# Patient Record
Sex: Female | Born: 1960 | Race: Black or African American | Hispanic: No | Marital: Single | Smoking: Never smoker
Health system: Southern US, Community
[De-identification: ages and names within clinical notes are randomized; demographics above are authoritative.]

## PROBLEM LIST (undated history)

## (undated) ENCOUNTER — Emergency Department (HOSPITAL_COMMUNITY): Admission: EM | Discharge status: Absent without leave | Payer: Medicare HMO

## (undated) DIAGNOSIS — I729 Aneurysm of unspecified site: Secondary | ICD-10-CM

## (undated) DIAGNOSIS — J189 Pneumonia, unspecified organism: Secondary | ICD-10-CM

## (undated) DIAGNOSIS — M1990 Unspecified osteoarthritis, unspecified site: Secondary | ICD-10-CM

## (undated) DIAGNOSIS — E039 Hypothyroidism, unspecified: Secondary | ICD-10-CM

## (undated) DIAGNOSIS — K633 Ulcer of intestine: Secondary | ICD-10-CM

## (undated) DIAGNOSIS — L97929 Non-pressure chronic ulcer of unspecified part of left lower leg with unspecified severity: Secondary | ICD-10-CM

## (undated) DIAGNOSIS — D571 Sickle-cell disease without crisis: Secondary | ICD-10-CM

## (undated) DIAGNOSIS — E876 Hypokalemia: Secondary | ICD-10-CM

## (undated) DIAGNOSIS — F419 Anxiety disorder, unspecified: Secondary | ICD-10-CM

## (undated) DIAGNOSIS — K8020 Calculus of gallbladder without cholecystitis without obstruction: Secondary | ICD-10-CM

## (undated) DIAGNOSIS — L97919 Non-pressure chronic ulcer of unspecified part of right lower leg with unspecified severity: Secondary | ICD-10-CM

## (undated) HISTORY — DX: Ulcer of intestine: K63.3

## (undated) HISTORY — DX: Calculus of gallbladder without cholecystitis without obstruction: K80.20

## (undated) HISTORY — PX: OTHER SURGICAL HISTORY: SHX169

## (undated) HISTORY — PX: CEREBRAL ANEURYSM REPAIR: SHX164

## (undated) HISTORY — PX: ANKLE SURGERY: SHX546

---

## 2011-09-25 ENCOUNTER — Encounter (HOSPITAL_COMMUNITY): Payer: Self-pay | Admitting: Emergency Medicine

## 2011-09-25 ENCOUNTER — Emergency Department (HOSPITAL_COMMUNITY)
Admission: EM | Admit: 2011-09-25 | Discharge: 2011-09-26 | Disposition: A | Discharge status: Home Health | Payer: Medicare Other | Attending: Emergency Medicine | Admitting: Emergency Medicine

## 2011-09-25 DIAGNOSIS — R109 Unspecified abdominal pain: Secondary | ICD-10-CM | POA: Insufficient documentation

## 2011-09-25 DIAGNOSIS — N390 Urinary tract infection, site not specified: Secondary | ICD-10-CM | POA: Insufficient documentation

## 2011-09-25 DIAGNOSIS — R112 Nausea with vomiting, unspecified: Secondary | ICD-10-CM | POA: Insufficient documentation

## 2011-09-25 DIAGNOSIS — M545 Low back pain, unspecified: Secondary | ICD-10-CM | POA: Insufficient documentation

## 2011-09-25 DIAGNOSIS — D571 Sickle-cell disease without crisis: Secondary | ICD-10-CM | POA: Insufficient documentation

## 2011-09-25 HISTORY — DX: Sickle-cell disease without crisis: D57.1

## 2011-09-25 LAB — DIFFERENTIAL
Band Neutrophils: 0 % (ref 0–10)
Basophils Absolute: 0 10*3/uL (ref 0.0–0.1)
Basophils Relative: 0 % (ref 0–1)
Blasts: 0 %
Eosinophils Absolute: 0.5 10*3/uL (ref 0.0–0.7)
Eosinophils Relative: 7 % — ABNORMAL HIGH (ref 0–5)
Lymphocytes Relative: 23 % (ref 12–46)
Lymphs Abs: 1.7 10*3/uL (ref 0.7–4.0)
Metamyelocytes Relative: 0 %
Monocytes Absolute: 0.6 10*3/uL (ref 0.1–1.0)
Monocytes Relative: 8 % (ref 3–12)
Myelocytes: 0 %
Neutro Abs: 4.6 10*3/uL (ref 1.7–7.7)
Neutrophils Relative %: 62 % (ref 43–77)
Promyelocytes Absolute: 0 %
nRBC: 0 /100 WBC

## 2011-09-25 LAB — CBC
HCT: 19.2 % — ABNORMAL LOW (ref 36.0–46.0)
Hemoglobin: 7.1 g/dL — ABNORMAL LOW (ref 12.0–15.0)
MCH: 31.1 pg (ref 26.0–34.0)
MCHC: 37 g/dL — ABNORMAL HIGH (ref 30.0–36.0)
MCV: 84.2 fL (ref 78.0–100.0)
Platelets: 189 10*3/uL (ref 150–400)
RBC: 2.28 MIL/uL — ABNORMAL LOW (ref 3.87–5.11)
RDW: 23.1 % — ABNORMAL HIGH (ref 11.5–15.5)
WBC: 7.4 10*3/uL (ref 4.0–10.5)

## 2011-09-25 LAB — COMPREHENSIVE METABOLIC PANEL
ALT: 36 U/L — ABNORMAL HIGH (ref 0–35)
AST: 127 U/L — ABNORMAL HIGH (ref 0–37)
Albumin: 4.3 g/dL (ref 3.5–5.2)
Alkaline Phosphatase: 67 U/L (ref 39–117)
BUN: 3 mg/dL — ABNORMAL LOW (ref 6–23)
CO2: 23 mEq/L (ref 19–32)
Calcium: 10.3 mg/dL (ref 8.4–10.5)
Chloride: 103 mEq/L (ref 96–112)
Creatinine, Ser: 0.83 mg/dL (ref 0.50–1.10)
GFR calc Af Amer: >90 mL/min (ref >90)
GFR calc non Af Amer: 80 mL/min — ABNORMAL LOW (ref >90)
Glucose, Bld: 99 mg/dL (ref 70–99)
Potassium: 3.9 mEq/L (ref 3.5–5.1)
Sodium: 138 mEq/L (ref 135–145)
Total Bilirubin: 5.3 mg/dL — ABNORMAL HIGH (ref 0.3–1.2)
Total Protein: 8.9 g/dL — ABNORMAL HIGH (ref 6.0–8.3)

## 2011-09-25 LAB — RETICULOCYTES
RBC.: 2.28 MIL/uL — ABNORMAL LOW (ref 3.87–5.11)
Retic Count, Absolute: 706.8 10*3/uL — ABNORMAL HIGH (ref 19.0–186.0)
Retic Ct Pct: 31 % — ABNORMAL HIGH (ref 0.4–3.1)

## 2011-09-25 MED ORDER — DIPHENHYDRAMINE HCL 50 MG/ML IJ SOLN: 12.5000 mg | Freq: Four times a day (QID) | INTRAMUSCULAR | Status: DC | PRN

## 2011-09-25 MED ORDER — OXYCODONE-ACETAMINOPHEN 5-325 MG PO TABS: 1.0000 | ORAL_TABLET | ORAL | Status: DC | PRN

## 2011-09-25 MED ORDER — ONDANSETRON HCL 4 MG/2ML IJ SOLN
4.0000 mg | Freq: Once | INTRAMUSCULAR | Status: AC
  Administered 2011-09-25: 4 mg via INTRAVENOUS
  Filled 2011-09-25: qty 2

## 2011-09-25 MED ORDER — ONDANSETRON HCL 4 MG/2ML IJ SOLN
4.0000 mg | Freq: Four times a day (QID) | INTRAMUSCULAR | Status: DC | PRN
  Administered 2011-09-26: 4 mg via INTRAVENOUS
  Filled 2011-09-25: qty 2

## 2011-09-25 MED ORDER — HYDROMORPHONE HCL PF 1 MG/ML IJ SOLN
1.0000 mg | Freq: Once | INTRAMUSCULAR | Status: AC
  Administered 2011-09-25: 1 mg via INTRAVENOUS
  Filled 2011-09-25: qty 1

## 2011-09-25 MED ORDER — SODIUM CHLORIDE 0.9 % IV SOLN
Freq: Once | INTRAVENOUS | Status: AC
  Administered 2011-09-25: 125 mL/h via INTRAVENOUS

## 2011-09-25 MED ORDER — HYDROMORPHONE HCL PF 1 MG/ML IJ SOLN
1.0000 mg | INTRAMUSCULAR | Status: DC | PRN
  Administered 2011-09-26: 1 mg via INTRAVENOUS
  Filled 2011-09-25: qty 1

## 2011-09-25 NOTE — ED Provider Notes (Signed)
History     CSN: [id number]  Arrival date & time 09/25/11  4098   First MD Initiated Contact with Patient 09/25/11 2053      Chief Complaint  Patient presents with  . Back Pain    (Consider location/radiation/quality/duration/timing/severity/associated sxs/prior treatment) Patient is a 51 y.o. female presenting with back pain. The history is provided by the patient.  Back Pain  This is a new problem. The current episode started more than 1 week ago. The problem occurs constantly. The problem has been gradually worsening. The pain is associated with no known injury. The pain is present in the lumbar spine. The quality of the pain is described as stabbing. The pain is at a severity of 8/10. Pertinent negatives include no fever, no numbness, no abdominal pain, no dysuria and no weakness.  Pt reports pain to the left lower back. Pain does not radiate. No injuries. No urinary symptoms. Pain worsened with movement and palpation. Pt states she is a sickle cell pt and this could be a crisis. She denies fever, chills, abdominal pain, chest pain, cough. Admits to nausea and several episodes of vomiting today. Taking tramadol for pain at home with no relief.   Past Medical History  Diagnosis Date  . Sickle cell anemia     Past Surgical History  Procedure Date  . Cerebral aneurysm repair     No family history on file.  History  Substance Use Topics  . Smoking status: Never Smoker   . Smokeless tobacco: Not on file  . Alcohol Use: No    OB History    Grav Para Term Preterm Abortions TAB SAB Ect Mult Living                  Review of Systems  Constitutional: Negative for fever and chills.  HENT: Negative.   Eyes: Negative.   Respiratory: Negative.   Cardiovascular: Negative.   Gastrointestinal: Positive for nausea and vomiting. Negative for abdominal pain.  Genitourinary: Positive for flank pain. Negative for dysuria, frequency, vaginal bleeding, vaginal discharge and vaginal  pain.  Musculoskeletal: Positive for back pain.  Skin: Negative.   Neurological: Negative for weakness and numbness.  Psychiatric/Behavioral: Negative.     Allergies  Review of patient's allergies indicates no active allergies.  Home Medications   Current Outpatient Rx  Name Route Sig Dispense Refill  . VITAMIN C PO Oral Take 500 mg elemental calcium/kg/hr by mouth daily.    Marland Kitchen CALTRATE 600 PLUS-VIT D PO Oral Take 1 tablet by mouth daily.    Marland Kitchen FOLIC ACID 800 MCG PO TABS Oral Take 400 mcg by mouth daily.    Marland Kitchen HYDROCODONE-ACETAMINOPHEN 5-500 MG PO CAPS Oral Take 1 capsule by mouth every 6 (six) hours as needed. For pain    . PROMETHAZINE HCL 25 MG PO TABS Oral Take 25 mg by mouth every 4 (four) hours as needed. For nausea    . TRAMADOL HCL 50 MG PO TABS Oral Take 50 mg by mouth every 6 (six) hours as needed. For pain      BP 113/77  Pulse 72  Temp(Src) 98.4 F (36.9 C) (Oral)  Resp 16  SpO2 98%  Physical Exam  Nursing note and vitals reviewed. Constitutional: She is oriented to person, place, and time. She appears well-developed and well-nourished. No distress.  HENT:  Head: Normocephalic and atraumatic.  Eyes: Pupils are equal, round, and reactive to light. Scleral icterus is present.  Neck: Neck supple.  Cardiovascular: Normal rate,  regular rhythm and normal heart sounds.   Pulmonary/Chest: Effort normal and breath sounds normal. No respiratory distress.  Abdominal: Soft. Bowel sounds are normal. She exhibits no distension. There is no tenderness.  Genitourinary:       Left cva tenderness  Musculoskeletal: Normal range of motion.       Normal appearing lower back. No midline spine tenderenss. Left side paraspinal tenderness. Pain worsened with pt leaning forward and backward. No pain with left straight leg raise.   Neurological: She is alert and oriented to person, place, and time.  Skin: Skin is warm and dry. No rash noted.  Psychiatric: She has a normal mood and affect.      ED Course  Procedures (including critical care time)  Pt with sickle cell disease, having left flank pain, possible sickle cell crisis. Will get labs, UA, pain management. Will place under sickle cell protocol in CDU. Followed by PA smith.  Results for orders placed during the hospital encounter of 09/25/11  CBC      Component Value Range   WBC 7.4  4.0 - 10.5 (K/uL)   RBC 2.28 (*) 3.87 - 5.11 (MIL/uL)   Hemoglobin 7.1 (*) 12.0 - 15.0 (g/dL)   HCT 16.1 (*) 09.6 - 46.0 (%)   MCV 84.2  78.0 - 100.0 (fL)   MCH 31.1  26.0 - 34.0 (pg)   MCHC 37.0 (*) 30.0 - 36.0 (g/dL)   RDW 04.5 (*) 40.9 - 15.5 (%)   Platelets 189  150 - 400 (K/uL)  DIFFERENTIAL      Component Value Range   Neutrophils Relative 62  43 - 77 (%)   Lymphocytes Relative 23  12 - 46 (%)   Monocytes Relative 8  3 - 12 (%)   Eosinophils Relative 7 (*) 0 - 5 (%)   Basophils Relative 0  0 - 1 (%)   Band Neutrophils 0  0 - 10 (%)   Metamyelocytes Relative 0     Myelocytes 0     Promyelocytes Absolute 0     Blasts 0     nRBC 0  0 (/100 WBC)   Neutro Abs 4.6  1.7 - 7.7 (K/uL)   Lymphs Abs 1.7  0.7 - 4.0 (K/uL)   Monocytes Absolute 0.6  0.1 - 1.0 (K/uL)   Eosinophils Absolute 0.5  0.0 - 0.7 (K/uL)   Basophils Absolute 0.0  0.0 - 0.1 (K/uL)   RBC Morphology POLYCHROMASIA PRESENT    COMPREHENSIVE METABOLIC PANEL      Component Value Range   Sodium 138  135 - 145 (mEq/L)   Potassium 3.9  3.5 - 5.1 (mEq/L)   Chloride 103  96 - 112 (mEq/L)   CO2 23  19 - 32 (mEq/L)   Glucose, Bld 99  70 - 99 (mg/dL)   BUN 3 (*) 6 - 23 (mg/dL)   Creatinine, Ser 8.11  0.50 - 1.10 (mg/dL)   Calcium 91.4  8.4 - 10.5 (mg/dL)   Total Protein 8.9 (*) 6.0 - 8.3 (g/dL)   Albumin 4.3  3.5 - 5.2 (g/dL)   AST 782 (*) 0 - 37 (U/L)   ALT 36 (*) 0 - 35 (U/L)   Alkaline Phosphatase 67  39 - 117 (U/L)   Total Bilirubin 5.3 (*) 0.3 - 1.2 (mg/dL)   GFR calc non Af Amer 80 (*) >90 (mL/min)   GFR calc Af Amer >90  >90 (mL/min)  RETICULOCYTES       Component Value Range  Retic Ct Pct 31.0 (*) 0.4 - 3.1 (%)   RBC. 2.28 (*) 3.87 - 5.11 (MIL/uL)   Retic Count, Manual 706.8 (*) 19.0 - 186.0 (K/uL)  URINALYSIS, ROUTINE W REFLEX MICROSCOPIC      Component Value Range   Color, Urine AMBER (*) YELLOW    APPearance CLOUDY (*) CLEAR    Specific Gravity, Urine 1.012  1.005 - 1.030    pH 7.0  5.0 - 8.0    Glucose, UA NEGATIVE  NEGATIVE (mg/dL)   Hgb urine dipstick LARGE (*) NEGATIVE    Bilirubin Urine SMALL (*) NEGATIVE    Ketones, ur NEGATIVE  NEGATIVE (mg/dL)   Protein, ur 119 (*) NEGATIVE (mg/dL)   Urobilinogen, UA 2.0 (*) 0.0 - 1.0 (mg/dL)   Nitrite NEGATIVE  NEGATIVE    Leukocytes, UA MODERATE (*) NEGATIVE   URINE MICROSCOPIC-ADD ON      Component Value Range   Squamous Epithelial / LPF MANY (*) RARE    WBC, UA 11-20  <3 (WBC/hpf)   RBC / HPF 7-10  <3 (RBC/hpf)   Bacteria, UA MANY (*) RARE    Casts HYALINE CASTS (*) NEGATIVE    5:37 AM UA infected, treated with rocephin, question possible pyelonephritis. Pt feeling much better with pain management. Pt wanting to go home. She is still having some nausea, but she does not want to stay any longer. States she is having no pain. Will d/c home with close follow up.  No diagnosis found.    MDM          Lottie Mussel, PA 09/26/11 [phone]

## 2011-09-25 NOTE — ED Notes (Signed)
Pt vomited x 1.  Pt states she will stick with eating ice chips and feels better at this time.

## 2011-09-25 NOTE — ED Notes (Signed)
Pt to ED c/o back pain x 2 weeks.  Hx of sickle cell, though she states this is not her typical manifestation of sickle cell pain.  Pain increases on movement.  Pt denies any strenuous exercise and states that has  Been under a lot of stress.

## 2011-09-25 NOTE — ED Notes (Signed)
Pt c/o low  Back pain onset 1 week ago

## 2011-09-25 NOTE — ED Notes (Signed)
Provided meal for pt 

## 2011-09-25 NOTE — ED Notes (Signed)
Report given to PAT RN

## 2011-09-25 NOTE — ED Notes (Signed)
Pt's O2 sat 90%.  Placed pt on 2L [place].

## 2011-09-26 LAB — URINALYSIS, ROUTINE W REFLEX MICROSCOPIC
Glucose, UA: NEGATIVE mg/dL
Ketones, ur: NEGATIVE mg/dL
Nitrite: NEGATIVE
Protein, ur: 100 mg/dL — AB
Specific Gravity, Urine: 1.012 (ref 1.005–1.030)
Urobilinogen, UA: 2 mg/dL — ABNORMAL HIGH (ref 0.0–1.0)
pH: 7 (ref 5.0–8.0)

## 2011-09-26 LAB — URINE MICROSCOPIC-ADD ON

## 2011-09-26 MED ORDER — OXYCODONE-ACETAMINOPHEN 5-325 MG PO TABS: 1.0000 | ORAL_TABLET | ORAL | Status: AC | PRN

## 2011-09-26 MED ORDER — PROMETHAZINE HCL 25 MG PO TABS: 25.0000 mg | ORAL_TABLET | Freq: Four times a day (QID) | ORAL | Status: DC | PRN

## 2011-09-26 MED ORDER — CEPHALEXIN 500 MG PO CAPS: 500.0000 mg | ORAL_CAPSULE | Freq: Four times a day (QID) | ORAL | Status: AC

## 2011-09-26 MED ORDER — DEXTROSE 5 % IV SOLN
1.0000 g | Freq: Once | INTRAVENOUS | Status: AC
  Administered 2011-09-26: 1 g via INTRAVENOUS
  Filled 2011-09-26: qty 10

## 2011-09-26 MED ORDER — PROMETHAZINE HCL 25 MG/ML IJ SOLN
12.5000 mg | INTRAMUSCULAR | Status: AC
  Administered 2011-09-26: 12.5 mg via INTRAVENOUS
  Filled 2011-09-26: qty 1

## 2011-09-26 NOTE — ED Notes (Signed)
Patient calling family for ride home and getting no answers only voice mail.

## 2011-09-26 NOTE — ED Provider Notes (Signed)
Medical screening examination/treatment/procedure(s) were conducted as a shared visit with non-physician practitioner(s) and myself.  I personally evaluated the patient during the encounter   Loren Racer, MD 09/26/11 [phone]

## 2011-09-26 NOTE — Discharge Instructions (Signed)
Your urine is infected which could be what is causing your pain. Start keflex as prescribed until all gone. Take percocet as prescribed as needed for pain. Phenergan for nausea. Follow up with your primary care doctor in 3-5 days. Return if worsening or unable to keep the medications down.   Urinary Tract Infection Infections of the urinary tract can start in several places. A bladder infection (cystitis), a kidney infection (pyelonephritis), and a prostate infection (prostatitis) are different types of urinary tract infections (UTIs). They usually get better if treated with medicines (antibiotics) that kill germs. Take all the medicine until it is gone. You or your child may feel better in a few days, but TAKE ALL MEDICINE or the infection may not respond and may become more difficult to treat. HOME CARE INSTRUCTIONS   Drink enough water and fluids to keep the urine clear or pale yellow. Cranberry juice is especially recommended, in addition to large amounts of water.   Avoid caffeine, tea, and carbonated beverages. They tend to irritate the bladder.   Alcohol may irritate the prostate.   Only take over-the-counter or prescription medicines for pain, discomfort, or fever as directed by your caregiver.  To prevent further infections:  Empty the bladder often. Avoid holding urine for long periods of time.   After a bowel movement, women should cleanse from front to back. Use each tissue only once.   Empty the bladder before and after sexual intercourse.  FINDING OUT THE RESULTS OF YOUR TEST Not all test results are available during your visit. If your or your child's test results are not back during the visit, make an appointment with your caregiver to find out the results. Do not assume everything is normal if you have not heard from your caregiver or the medical facility. It is important for you to follow up on all test results. SEEK MEDICAL CARE IF:   There is back pain.   Your baby is  older than 3 months with a rectal temperature of 100.5 F (38.1 C) or higher for more than 1 day.   Your or your child's problems (symptoms) are no better in 3 days. Return sooner if you or your child is getting worse.  SEEK IMMEDIATE MEDICAL CARE IF:   There is severe back pain or lower abdominal pain.   You or your child develops chills.   You have a fever.   Your baby is older than 3 months with a rectal temperature of 102 F (38.9 C) or higher.   Your baby is 34 months old or younger with a rectal temperature of 100.4 F (38 C) or higher.   There is nausea or vomiting.   There is continued burning or discomfort with urination.  MAKE SURE YOU:   Understand these instructions.   Will watch your condition.   Will get help right away if you are not doing well or get worse.  Document Released: 04/18/2005 Document Revised: 06/28/2011 Document Reviewed: 11/21/2006 [facility] Patient Information 2012 [place], [place].

## 2011-11-23 ENCOUNTER — Encounter (HOSPITAL_COMMUNITY): Payer: Self-pay | Admitting: *Deleted

## 2011-11-23 ENCOUNTER — Inpatient Hospital Stay (HOSPITAL_COMMUNITY)
Admission: AD | Admit: 2011-11-23 | Discharge: 2011-11-30 | DRG: 811 | Disposition: A | Discharge status: Home / Self Care | Payer: Medicare Other | Attending: Internal Medicine | Admitting: Internal Medicine

## 2011-11-23 DIAGNOSIS — J189 Pneumonia, unspecified organism: Secondary | ICD-10-CM | POA: Diagnosis present

## 2011-11-23 DIAGNOSIS — D5700 Hb-SS disease with crisis, unspecified: Secondary | ICD-10-CM

## 2011-11-23 DIAGNOSIS — E039 Hypothyroidism, unspecified: Secondary | ICD-10-CM

## 2011-11-23 DIAGNOSIS — IMO0002 Reserved for concepts with insufficient information to code with codable children: Secondary | ICD-10-CM

## 2011-11-23 DIAGNOSIS — L97909 Non-pressure chronic ulcer of unspecified part of unspecified lower leg with unspecified severity: Secondary | ICD-10-CM | POA: Diagnosis present

## 2011-11-23 DIAGNOSIS — E871 Hypo-osmolality and hyponatremia: Secondary | ICD-10-CM

## 2011-11-23 DIAGNOSIS — E876 Hypokalemia: Secondary | ICD-10-CM | POA: Diagnosis present

## 2011-11-23 DIAGNOSIS — K5900 Constipation, unspecified: Secondary | ICD-10-CM

## 2011-11-23 DIAGNOSIS — M069 Rheumatoid arthritis, unspecified: Secondary | ICD-10-CM | POA: Diagnosis present

## 2011-11-23 DIAGNOSIS — E079 Disorder of thyroid, unspecified: Secondary | ICD-10-CM

## 2011-11-23 DIAGNOSIS — R079 Chest pain, unspecified: Secondary | ICD-10-CM

## 2011-11-23 HISTORY — DX: Unspecified osteoarthritis, unspecified site: M19.90

## 2011-11-23 HISTORY — DX: Hypokalemia: E87.6

## 2011-11-23 HISTORY — DX: Pneumonia, unspecified organism: J18.9

## 2011-11-23 HISTORY — DX: Anxiety disorder, unspecified: F41.9

## 2011-11-23 HISTORY — DX: Non-pressure chronic ulcer of unspecified part of right lower leg with unspecified severity: L97.929

## 2011-11-23 HISTORY — DX: Non-pressure chronic ulcer of unspecified part of left lower leg with unspecified severity: L97.919

## 2011-11-23 HISTORY — DX: Aneurysm of unspecified site: I72.9

## 2011-11-23 HISTORY — DX: Hypothyroidism, unspecified: E03.9

## 2011-11-23 HISTORY — DX: Non-pressure chronic ulcer of unspecified part of right lower leg with unspecified severity: L97.919

## 2011-11-23 LAB — COMPREHENSIVE METABOLIC PANEL
ALT: 20 U/L (ref 0–35)
AST: 97 U/L — ABNORMAL HIGH (ref 0–37)
Albumin: 3.9 g/dL (ref 3.5–5.2)
Alkaline Phosphatase: 56 U/L (ref 39–117)
BUN: 5 mg/dL — ABNORMAL LOW (ref 6–23)
CO2: 26 mEq/L (ref 19–32)
Calcium: 9.2 mg/dL (ref 8.4–10.5)
Chloride: 98 mEq/L (ref 96–112)
Creatinine, Ser: 0.75 mg/dL (ref 0.50–1.10)
GFR calc Af Amer: >90 mL/min (ref >90)
GFR calc non Af Amer: >90 mL/min (ref >90)
Glucose, Bld: 89 mg/dL (ref 70–99)
Potassium: 3.2 mEq/L — ABNORMAL LOW (ref 3.5–5.1)
Sodium: 137 mEq/L (ref 135–145)
Total Bilirubin: 8.3 mg/dL — ABNORMAL HIGH (ref 0.3–1.2)
Total Protein: 8.4 g/dL — ABNORMAL HIGH (ref 6.0–8.3)

## 2011-11-23 LAB — DIFFERENTIAL
Basophils Absolute: 0.1 10*3/uL (ref 0.0–0.1)
Basophils Relative: 1 % (ref 0–1)
Eosinophils Absolute: 0.2 10*3/uL (ref 0.0–0.7)
Eosinophils Relative: 2 % (ref 0–5)
Lymphocytes Relative: 26 % (ref 12–46)
Lymphs Abs: 2.8 10*3/uL (ref 0.7–4.0)
Monocytes Absolute: 1.1 10*3/uL — ABNORMAL HIGH (ref 0.1–1.0)
Monocytes Relative: 10 % (ref 3–12)
Neutro Abs: 6.4 10*3/uL (ref 1.7–7.7)
Neutrophils Relative %: 61 % (ref 43–77)

## 2011-11-23 LAB — CBC
HCT: 16.9 % — ABNORMAL LOW (ref 36.0–46.0)
Hemoglobin: 6.3 g/dL — CL (ref 12.0–15.0)
MCH: 32.6 pg (ref 26.0–34.0)
MCHC: 37.3 g/dL — ABNORMAL HIGH (ref 30.0–36.0)
MCV: 87.6 fL (ref 78.0–100.0)
Platelets: 232 10*3/uL (ref 150–400)
RBC: 1.93 MIL/uL — ABNORMAL LOW (ref 3.87–5.11)
RDW: 26.7 % — ABNORMAL HIGH (ref 11.5–15.5)
WBC: 10.6 10*3/uL — ABNORMAL HIGH (ref 4.0–10.5)

## 2011-11-23 LAB — PREPARE RBC (CROSSMATCH)

## 2011-11-23 LAB — ABO/RH: ABO/RH(D): B POS

## 2011-11-23 MED ORDER — DIPHENHYDRAMINE HCL 50 MG/ML IJ SOLN: 12.5000 mg | INTRAMUSCULAR | Status: DC | PRN

## 2011-11-23 MED ORDER — FOLIC ACID 1 MG PO TABS
1.0000 mg | ORAL_TABLET | Freq: Every day | ORAL | Status: DC
  Administered 2011-11-23 – 2011-11-30 (×8): 1 mg via ORAL
  Filled 2011-11-23 (×8): qty 1

## 2011-11-23 MED ORDER — KCL IN DEXTROSE-NACL 20-5-0.45 MEQ/L-%-% IV SOLN
INTRAVENOUS | Status: DC
  Administered 2011-11-24 – 2011-11-27 (×8): via INTRAVENOUS
  Filled 2011-11-23 (×11): qty 1000

## 2011-11-23 MED ORDER — ENOXAPARIN SODIUM 40 MG/0.4ML ~~LOC~~ SOLN
40.0000 mg | SUBCUTANEOUS | Status: DC
  Administered 2011-11-23 – 2011-11-29 (×7): 40 mg via SUBCUTANEOUS
  Filled 2011-11-23 (×9): qty 0.4

## 2011-11-23 MED ORDER — NALOXONE HCL 0.4 MG/ML IJ SOLN
0.4000 mg | INTRAMUSCULAR | Status: DC | PRN
  Administered 2011-11-23: 0.4 mg via INTRAVENOUS
  Filled 2011-11-23: qty 1

## 2011-11-23 MED ORDER — ACETAMINOPHEN 325 MG PO TABS: 650.0000 mg | ORAL_TABLET | Freq: Once | ORAL | Status: DC

## 2011-11-23 MED ORDER — ALPRAZOLAM 0.25 MG PO TABS: 0.2500 mg | ORAL_TABLET | ORAL | Status: DC | PRN

## 2011-11-23 MED ORDER — ONDANSETRON HCL 4 MG/2ML IJ SOLN
4.0000 mg | INTRAMUSCULAR | Status: DC | PRN
  Administered 2011-11-23 – 2011-11-29 (×6): 4 mg via INTRAVENOUS
  Filled 2011-11-23 (×6): qty 2

## 2011-11-23 MED ORDER — HYDROMORPHONE HCL PF 2 MG/ML IJ SOLN
2.0000 mg | INTRAMUSCULAR | Status: DC | PRN
  Administered 2011-11-23: 4 mg via INTRAVENOUS
  Filled 2011-11-23: qty 2

## 2011-11-23 MED ORDER — ALUM & MAG HYDROXIDE-SIMETH 200-200-20 MG/5ML PO SUSP
15.0000 mL | ORAL | Status: DC | PRN
  Filled 2011-11-23: qty 30

## 2011-11-23 MED ORDER — DIPHENHYDRAMINE HCL 50 MG/ML IJ SOLN
12.5000 mg | INTRAMUSCULAR | Status: DC | PRN
  Administered 2011-11-23: 25 mg via INTRAVENOUS
  Filled 2011-11-23: qty 1

## 2011-11-23 MED ORDER — DEXTROSE-NACL 5-0.45 % IV SOLN
INTRAVENOUS | Status: DC
  Administered 2011-11-23: 15:00:00 via INTRAVENOUS

## 2011-11-23 MED ORDER — DOCUSATE SODIUM 100 MG PO CAPS
100.0000 mg | ORAL_CAPSULE | Freq: Two times a day (BID) | ORAL | Status: DC
  Administered 2011-11-23 – 2011-11-26 (×7): 100 mg via ORAL
  Filled 2011-11-23 (×6): qty 1

## 2011-11-23 MED ORDER — ONDANSETRON HCL 4 MG PO TABS: 4.0000 mg | ORAL_TABLET | ORAL | Status: DC | PRN

## 2011-11-23 MED ORDER — DIPHENHYDRAMINE HCL 25 MG PO CAPS: 25.0000 mg | ORAL_CAPSULE | ORAL | Status: DC | PRN

## 2011-11-23 MED ORDER — ZOLPIDEM TARTRATE 5 MG PO TABS: 5.0000 mg | ORAL_TABLET | Freq: Every evening | ORAL | Status: DC | PRN

## 2011-11-23 MED ORDER — POTASSIUM CHLORIDE CRYS ER 20 MEQ PO TBCR
40.0000 meq | EXTENDED_RELEASE_TABLET | Freq: Two times a day (BID) | ORAL | Status: DC
  Administered 2011-11-23 – 2011-11-27 (×8): 40 meq via ORAL
  Filled 2011-11-23 (×9): qty 2

## 2011-11-23 MED ORDER — HYDROMORPHONE HCL PF 2 MG/ML IJ SOLN
1.0000 mg | INTRAMUSCULAR | Status: DC | PRN
  Administered 2011-11-25 – 2011-11-29 (×2): 1 mg via INTRAVENOUS
  Filled 2011-11-23 (×2): qty 1

## 2011-11-23 NOTE — Progress Notes (Signed)
Patient ID: [name], female   DOB: 09-16-1960, 51 y.o.   MRN: [id number] Report given to [facility], RN on 3W. Pt transported via wheelchair with staff to 1344. All belongings with pt upon discharge. Notified daughter [name] of admission. VSS pt with no complaints at discharge

## 2011-11-23 NOTE — H&P (Signed)
[name] is an 51 y.o. female.   Chief Complaint: Patient complaining of severe pain in fingers and limbs. Feeling lightheaded and dizzy. 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 She notes that she has frequent sickle cell crisis approximately one to 2 every month. She states that normally when she sees her regular physician in [facility], she tends to be admitted for further treatment. She was last admitted to [facility] in January of this year. She is presently complaining of pain in hands with associated swelling of fingers. She notes she's had some chronic swelling since being diagnosed with presumably rheumatoid arthritis. For the past several days however her pain has been 8/10 with a feeling lightheaded when standing. No true vertigo. She denies abdominal pain hematemesis melena or hematochezia.  Her last mammogram was in 2012. Her last urine Pap smear was in January 2013.  Past Medical History  Diagnosis Date  . Sickle cell anemia     Past Surgical History  Procedure Date  . Cerebral aneurysm repair     No family history on file. Social History:  reports that she has never smoked. She does not have any smokeless tobacco history on file. She reports that she does not drink alcohol or use illicit drugs.  Allergies:  Allergies  Allergen Reactions  . Ampicillin   . Demerol (Meperidine)    when she takes Demerol she's had a seizure in the past associated with high dose Demerol. Ampicillin causes a rash.  Medications Prior to Admission  Medication Sig Dispense Refill  . Ascorbic Acid (VITAMIN C PO) Take 500 mg elemental calcium/kg/hr by mouth daily.      . Calcium-Vitamin D (CALTRATE 600 PLUS-VIT D PO) Take 1 tablet by mouth daily.      . folic acid (FOLVITE) 800 MCG tablet Take 400 mcg by mouth daily.       . hydrocodone-acetaminophen (LORCET-HD) 5-500 MG per capsule Take 1 capsule by mouth every 6 (six) hours as needed. For pain      . promethazine (PHENERGAN) 25 MG tablet Take 25 mg by mouth every 4 (four) hours as needed. For nausea      . promethazine (PHENERGAN) 25 MG tablet Take 1 tablet (25 mg total) by mouth every 6 (six) hours as needed for nausea.  20 tablet  0  . traMADol (ULTRAM) 50 MG tablet Take 50 mg by mouth every 6 (six) hours as needed. For pain        Results for orders placed during the hospital encounter of 11/23/11 (from the past 48 hour(s))  COMPREHENSIVE METABOLIC PANEL     Status: Abnormal   Collection Time   11/23/11  2:35 PM      Component Value Range Comment   Sodium 137  135 - 145 (mEq/L)    Potassium 3.2 (*) 3.5 - 5.1 (mEq/L)    Chloride 98  96 - 112 (mEq/L)    CO2 26  19 - 32 (mEq/L)    Glucose, Bld 89  70 - 99 (mg/dL)    BUN 5 (*) 6 - 23 (mg/dL)    Creatinine, Ser 2.13  0.50 - 1.10 (mg/dL)    Calcium 9.2  8.4 - 10.5 (mg/dL)    Total Protein 8.4 (*) 6.0 - 8.3 (g/dL)    Albumin 3.9  3.5 - 5.2 (g/dL)  AST 97 (*) 0 - 37 (U/L)    ALT 20  0 - 35 (U/L)    Alkaline Phosphatase 56  39 - 117 (U/L)    Total Bilirubin 8.3 (*) 0.3 - 1.2 (mg/dL)    GFR calc non Af Amer >90  >90 (mL/min)    GFR calc Af Amer >90  >90 (mL/min)   CBC     Status: Abnormal   Collection Time   11/23/11  2:35 PM      Component Value Range Comment   WBC 10.6 (*) 4.0 - 10.5 (K/uL)    RBC 1.93 (*) 3.87 - 5.11 (MIL/uL)    Hemoglobin 6.3 (*) 12.0 - 15.0 (g/dL)    HCT 16.1 (*) 09.6 - 46.0 (%)    MCV 87.6  78.0 - 100.0 (fL)    MCH 32.6  26.0 - 34.0 (pg)    MCHC 37.3 (*) 30.0 - 36.0 (g/dL)    RDW 04.5 (*) 40.9 - 15.5 (%)    Platelets 232  150 - 400 (K/uL)   DIFFERENTIAL     Status: Abnormal   Collection Time   11/23/11  2:35 PM      Component Value Range Comment   Neutrophils Relative 61  43 - 77 (%)    Lymphocytes Relative 26  12 - 46 (%)    Monocytes Relative 10  3 - 12 (%)     Eosinophils Relative 2  0 - 5 (%)    Basophils Relative 1  0 - 1 (%)    Neutro Abs 6.4  1.7 - 7.7 (K/uL)    Lymphs Abs 2.8  0.7 - 4.0 (K/uL)    Monocytes Absolute 1.1 (*) 0.1 - 1.0 (K/uL)    Eosinophils Absolute 0.2  0.0 - 0.7 (K/uL)    Basophils Absolute 0.1  0.0 - 0.1 (K/uL)    RBC Morphology MARKED POLYCHROMASIA      Smear Review GIANT PLATELETS SEEN     TYPE AND SCREEN     Status: Normal (Preliminary result)   Collection Time   11/23/11  3:43 PM      Component Value Range Comment   ABO/RH(D) B POS      Antibody Screen PENDING      Sample Expiration 11/26/2011     ABO/RH     Status: Normal   Collection Time   11/23/11  3:51 PM      Component Value Range Comment   ABO/RH(D) B POS      No results found.  As noted above. Patient not a very good historian. No previous baseline.  Blood pressure 116/73, pulse 77, temperature 97.4 F (36.3 C), temperature source Oral, resp. rate 10, SpO2 100.00%. Weak-appearing black female presently complaining of limb pain. HEENT: Positive sclera icterus. No sinus tenderness. TMs with decreased light reflex. Posterior pharynx clear. NECK: No posterior cervical nodes. LUNGS: Decreased breath sounds at bases. No wheezes. No vocal fremitus. CV: Normal S1, S2 without S3. ABDOMEN: No tenderness appreciated. MSK: Hand notable for marked swelling of the PIP joints of both hands. Tenderness to palpation as well. Minimal increased warmth. Negative Homans. No edema. NEURO: Alert, oriented x3. Moves all extremities equally.  Assessment Sickle cell crisis. Active hemolysis. Swelling of PIP joints. Rule out rheumatoid arthritis by history. Baseline still pending. History of cerebral aneurysm repair. Hypokalemia.  Plan: Patient was initially treated in the day hospital. She received IV fluids as well as IV Dilaudid and Benadryl. Patient developed increase somnolence. Blood pressure and  O2 sats were maintained. She was given 0.25 amp of Narcan with prompt  reversal. Patient will be admitted for further treatment of her crisis. She required transfusion initially. Her proceed with exchange transfusion thereafter. Replenish potassium as well. Obtain baseline rheumatoid factor, hemoglobinopathy panel.  Reevaluate thereafter.  [name] 11/23/2011, 6:14 PM

## 2011-11-23 NOTE — Progress Notes (Signed)
Critical result report from lab. Hb 6.3. Reported verbally to Dr Bary Richard. Orders received

## 2011-11-24 LAB — CBC
HCT: 15.9 % — ABNORMAL LOW (ref 36.0–46.0)
Hemoglobin: 5.7 g/dL — CL (ref 12.0–15.0)
MCH: 30.8 pg (ref 26.0–34.0)
MCHC: 35.8 g/dL (ref 30.0–36.0)
MCV: 85.9 fL (ref 78.0–100.0)
Platelets: 169 10*3/uL (ref 150–400)
RBC: 1.85 MIL/uL — ABNORMAL LOW (ref 3.87–5.11)
RDW: 21.9 % — ABNORMAL HIGH (ref 11.5–15.5)
WBC: 15.1 10*3/uL — ABNORMAL HIGH (ref 4.0–10.5)

## 2011-11-24 LAB — COMPREHENSIVE METABOLIC PANEL
ALT: 16 U/L (ref 0–35)
AST: 82 U/L — ABNORMAL HIGH (ref 0–37)
Albumin: 3.4 g/dL — ABNORMAL LOW (ref 3.5–5.2)
Alkaline Phosphatase: 48 U/L (ref 39–117)
BUN: 4 mg/dL — ABNORMAL LOW (ref 6–23)
CO2: 27 mEq/L (ref 19–32)
Calcium: 8.6 mg/dL (ref 8.4–10.5)
Chloride: 99 mEq/L (ref 96–112)
Creatinine, Ser: 0.78 mg/dL (ref 0.50–1.10)
GFR calc Af Amer: >90 mL/min (ref >90)
GFR calc non Af Amer: >90 mL/min (ref >90)
Glucose, Bld: 90 mg/dL (ref 70–99)
Potassium: 3.5 mEq/L (ref 3.5–5.1)
Sodium: 135 mEq/L (ref 135–145)
Total Bilirubin: 5.8 mg/dL — ABNORMAL HIGH (ref 0.3–1.2)
Total Protein: 7.5 g/dL (ref 6.0–8.3)

## 2011-11-24 LAB — PREPARE RBC (CROSSMATCH)

## 2011-11-24 MED ORDER — OXYCODONE-ACETAMINOPHEN 5-325 MG PO TABS
1.0000 | ORAL_TABLET | ORAL | Status: DC | PRN
  Administered 2011-11-24 – 2011-11-27 (×4): 1 via ORAL
  Filled 2011-11-24 (×4): qty 1

## 2011-11-24 NOTE — Progress Notes (Signed)
Subjective:  No chest pain. Anemia persist. Afebrile.  Objective:  Vital Signs in the last 24 hours: Temp:  [97.4 F (36.3 C)-98.6 F (37 C)] 98.2 F (36.8 C) (05/04 1015) Pulse Rate:  [63-96] 64  (05/04 1015) Cardiac Rhythm:  [-]  Resp:  [8-20] 18  (05/04 1015) BP: (97-133)/(58-76) 97/58 mmHg (05/04 1015) SpO2:  [94 %-100 %] 100 % (05/04 0501) FiO2 (%):  [3 %] 3 % (05/04 0501) Weight:  [67.586 kg (149 lb)-67.631 kg (149 lb 1.6 oz)] 67.631 kg (149 lb 1.6 oz) (05/04 0501)  Physical Exam: BP Readings from Last 1 Encounters:  11/24/11 97/58    Wt Readings from Last 1 Encounters:  11/24/11 67.631 kg (149 lb 1.6 oz)    Weight change:   HEENT: [place]/AT, Eyes-Brown, PERL, EOMI, Conjunctiva-Pale, Sclera-icteric Neck: No JVD, No bruit, Trachea midline. Lungs:  Clear, Bilateral. Cardiac:  Regular rhythm, normal S1 and S2, no S3.  Abdomen:  Soft, non-tender. Extremities:  No edema present. No cyanosis. No clubbing. CNS: AxOx3, Cranial nerves grossly intact, moves all 4 extremities. Right handed. Skin: Warm and dry.   Intake/Output from previous day: 05/03 0701 - 05/04 0700 In: 2041.5 [I.V.:1654; Blood:387.5] Out: 1650 [Urine:1400; Emesis/NG output:250]    Lab Results: BMET    Component Value Date/Time   NA 135 11/24/2011 0703   K 3.5 11/24/2011 0703   CL 99 11/24/2011 0703   CO2 27 11/24/2011 0703   GLUCOSE 90 11/24/2011 0703   BUN 4* 11/24/2011 0703   CREATININE 0.78 11/24/2011 0703   CALCIUM 8.6 11/24/2011 0703   GFRNONAA >90 11/24/2011 0703   GFRAA >90 11/24/2011 0703   CBC    Component Value Date/Time   WBC 15.1* 11/24/2011 0703   RBC 1.85* 11/24/2011 0703   HGB 5.7* 11/24/2011 0703   HCT 15.9* 11/24/2011 0703   PLT 169 11/24/2011 0703   MCV 85.9 11/24/2011 0703   MCH 30.8 11/24/2011 0703   MCHC 35.8 11/24/2011 0703   RDW 21.9* 11/24/2011 0703   LYMPHSABS 2.8 11/23/2011 1435   MONOABS 1.1* 11/23/2011 1435   EOSABS 0.2 11/23/2011 1435   BASOSABS 0.1 11/23/2011 1435   CARDIAC ENZYMES No results found  for this basename: CKTOTAL, CKMB, CKMBINDEX, TROPONINI    Assessment/Plan:  Patient Active Hospital Problem List:  Sickle cell crisis.  Active hemolysis.  Swelling of PIP joints.  Possible rheumatoid arthritis by history.  History of cerebral aneurysm repair.  Hypokalemia  Continue blood transfusion   LOS: 1 day    Orpah Cobb  MD  11/24/2011, 10:31 AM

## 2011-11-24 NOTE — Progress Notes (Signed)
CRITICAL VALUE ALERT  Critical value received: Hgb 5.7  Date of notification:  11/24/11  Time of notification:  0740  Critical value read back:yes  Nurse who received alert:  Garnette Scheuermann  MD notified (1st page):  Algie Coffer  Time of first page:  0745  MD notified (2nd page):  Time of second page:  Responding MD:  Algie Coffer  Time MD responded:  [phone]

## 2011-11-25 LAB — CBC
HCT: 24.4 % — ABNORMAL LOW (ref 36.0–46.0)
Hemoglobin: 8.8 g/dL — ABNORMAL LOW (ref 12.0–15.0)
MCH: 29.9 pg (ref 26.0–34.0)
MCHC: 36.1 g/dL — ABNORMAL HIGH (ref 30.0–36.0)
MCV: 83 fL (ref 78.0–100.0)
Platelets: 207 10*3/uL (ref 150–400)
RBC: 2.94 MIL/uL — ABNORMAL LOW (ref 3.87–5.11)
RDW: 22.5 % — ABNORMAL HIGH (ref 11.5–15.5)
WBC: 9.3 10*3/uL (ref 4.0–10.5)

## 2011-11-25 NOTE — Progress Notes (Signed)
Subjective:  Feeling bad with joint pains in hands.  Objective:  Vital Signs in the last 24 hours: Temp:  [97.8 F (36.6 C)-98.9 F (37.2 C)] 97.8 F (36.6 C) (05/05 1021) Pulse Rate:  [53-70] 53  (05/05 1021) Cardiac Rhythm:  [-]  Resp:  [16-20] 16  (05/05 1021) BP: (101-149)/(61-71) 149/71 mmHg (05/05 1021) SpO2:  [88 %-97 %] 88 % (05/05 1021) FiO2 (%):  [2 %] 2 % (05/05 0528) Weight:  [69.9 kg (154 lb 1.6 oz)] 69.9 kg (154 lb 1.6 oz) (05/05 0528)  Physical Exam: BP Readings from Last 1 Encounters:  11/25/11 149/71    Wt Readings from Last 1 Encounters:  11/25/11 69.9 kg (154 lb 1.6 oz)    Weight change: 2.314 kg (5 lb 1.6 oz)  HEENT: [place]/AT, Eyes-Brown, PERL, EOMI, Conjunctiva-Pale, Sclera-icteric Neck: No JVD, No bruit, Trachea midline. Lungs:  Clear, Bilateral. Cardiac:  Regular rhythm, normal S1 and S2, no S3.  Abdomen:  Soft, non-tender. Extremities:  No edema present. No cyanosis. No clubbing. + arthritic changes in both hands. CNS: AxOx3, Cranial nerves grossly intact, moves all 4 extremities. Right handed. Skin: Warm and dry.   Intake/Output from previous day: 05/04 0701 - 05/05 0700 In: 1354 [P.O.:120; I.V.:494; Blood:740] Out: 1675 [Urine:1675]    Lab Results: BMET    Component Value Date/Time   NA 135 11/24/2011 0703   K 3.5 11/24/2011 0703   CL 99 11/24/2011 0703   CO2 27 11/24/2011 0703   GLUCOSE 90 11/24/2011 0703   BUN 4* 11/24/2011 0703   CREATININE 0.78 11/24/2011 0703   CALCIUM 8.6 11/24/2011 0703   GFRNONAA >90 11/24/2011 0703   GFRAA >90 11/24/2011 0703   CBC    Component Value Date/Time   WBC 15.1* 11/24/2011 0703   RBC 1.85* 11/24/2011 0703   HGB 5.7* 11/24/2011 0703   HCT 15.9* 11/24/2011 0703   PLT 169 11/24/2011 0703   MCV 85.9 11/24/2011 0703   MCH 30.8 11/24/2011 0703   MCHC 35.8 11/24/2011 0703   RDW 21.9* 11/24/2011 0703   LYMPHSABS 2.8 11/23/2011 1435   MONOABS 1.1* 11/23/2011 1435   EOSABS 0.2 11/23/2011 1435   BASOSABS 0.1 11/23/2011 1435   CARDIAC  ENZYMES No results found for this basename: CKTOTAL, CKMB, CKMBINDEX, TROPONINI    Assessment/Plan:  Patient Active Hospital Problem List:  Sickle cell crisis.  Active hemolysis.  Swelling of PIP joints.  Possible rheumatoid arthritis by history.  History of cerebral aneurysm repair.  Hypokalemia  Continue blood transfusion     LOS: 2 days    Orpah Cobb  MD  11/25/2011, 10:56 AM

## 2011-11-26 ENCOUNTER — Inpatient Hospital Stay (HOSPITAL_COMMUNITY): Payer: Medicare Other

## 2011-11-26 DIAGNOSIS — IMO0002 Reserved for concepts with insufficient information to code with codable children: Secondary | ICD-10-CM

## 2011-11-26 DIAGNOSIS — K5900 Constipation, unspecified: Secondary | ICD-10-CM

## 2011-11-26 MED ORDER — POLYETHYLENE GLYCOL 3350 17 G PO PACK
17.0000 g | PACK | Freq: Every day | ORAL | Status: DC
  Administered 2011-11-26 – 2011-11-30 (×5): 17 g via ORAL
  Filled 2011-11-26 (×5): qty 1

## 2011-11-26 MED ORDER — LIDOCAINE 5 % EX PTCH: 1.0000 | MEDICATED_PATCH | Freq: Every day | CUTANEOUS | Status: DC

## 2011-11-26 MED ORDER — ENSURE COMPLETE PO LIQD
237.0000 mL | Freq: Three times a day (TID) | ORAL | Status: DC
  Administered 2011-11-26 – 2011-11-30 (×11): 237 mL via ORAL

## 2011-11-26 MED ORDER — IBUPROFEN 400 MG PO TABS
400.0000 mg | ORAL_TABLET | Freq: Four times a day (QID) | ORAL | Status: DC
  Administered 2011-11-26 – 2011-11-30 (×17): 400 mg via ORAL
  Filled 2011-11-26 (×19): qty 1

## 2011-11-26 MED ORDER — LIDOCAINE 5 % EX PTCH
1.0000 | MEDICATED_PATCH | Freq: Once | CUTANEOUS | Status: DC
  Filled 2011-11-26: qty 1

## 2011-11-26 MED ORDER — LIDOCAINE 5 % EX PTCH
1.0000 | MEDICATED_PATCH | Freq: Every day | CUTANEOUS | Status: DC
  Administered 2011-11-26 – 2011-11-29 (×4): 1 via TRANSDERMAL
  Filled 2011-11-26 (×6): qty 1

## 2011-11-26 MED ORDER — PANTOPRAZOLE SODIUM 40 MG PO TBEC
40.0000 mg | DELAYED_RELEASE_TABLET | Freq: Every day | ORAL | Status: DC
  Administered 2011-11-26 – 2011-11-30 (×5): 40 mg via ORAL
  Filled 2011-11-26 (×6): qty 1

## 2011-11-26 MED ORDER — MUSCLE RUB 10-15 % EX CREA
TOPICAL_CREAM | Freq: Three times a day (TID) | CUTANEOUS | Status: DC
  Administered 2011-11-26 – 2011-11-30 (×10): via TOPICAL
  Filled 2011-11-26: qty 85

## 2011-11-26 MED ORDER — MUPIROCIN CALCIUM 2 % EX CREA
TOPICAL_CREAM | Freq: Two times a day (BID) | CUTANEOUS | Status: DC
  Administered 2011-11-26 (×2): via TOPICAL
  Filled 2011-11-26 (×2): qty 15

## 2011-11-26 MED ORDER — ACETAMINOPHEN 325 MG PO TABS
650.0000 mg | ORAL_TABLET | ORAL | Status: DC | PRN
  Filled 2011-11-26: qty 2

## 2011-11-26 NOTE — Progress Notes (Signed)
Subjective: The patient was seen on rounds today.  The patient was laying quietly in her bed.  The patient is complaining of pain 6/10 in her bilateral hand and wrist areas.  The patient states that her pain in her hand/wrists have progressively worsened over the last 3 weeks.  The patient is also complaining of bilateral knee pain and constipation.  The patient states that she has had a decrease in appetite.   Discussed and agreed upon plan of care with the patient which includes a PT consult and Xray of bilateral hands/wrists.    No other nursing or patient concerns.   Objective: Vital signs in last 24 hours: Blood pressure 110/63, pulse 72, temperature 100 F (37.8 C), temperature source Oral, resp. rate 14, height 5\' 11"  (1.803 m), weight 150 lb 2.1 oz (68.1 kg), SpO2 92.00%.  General Appearance: Alert, cooperative, appears stated age, well nourished, well developed, mild distress Head: Normocephalic, without obvious abnormality, atraumatic Eyes: PERRLA, EOMI, scleral icterus Nose: Nares, septum and mucosa are normal, no drainage or sinus tenderness Throat: Lips, mucosa, and tongue normal; teeth and gums normal Neck: No adenopathy, supple, symmetrical, trachea midline and thyroid not enlarged, symmetric, no tenderness Back: Symmetric, no curvature, normal ROM, no CVA tenderness Resp: Diminished bibasilar breath sounds, CTA, no wheezes/rales/rhonchi Cardio: Regular rate and rhythm, S1, S2 normal, no murmur/click/rub/gallop GI: soft, non-tender; bowel sounds normal; no masses,  no organomegaly Extremities: Painful and tender extremities, atraumatic, trace UE edema, Homans sign is negative, no sign of DVT, bilateral LE ulcers Pulses: 2+ and symmetric Skin: Bilateral LE ulcers in various stages of healing and bilateral UE lesions/ulcers in various stages of healing Neurologic: Grossly normal, CN II - XII intact, no focal deficits Psych:  Flat affect  Lab Results: No results found for this  or any previous visit (from the past 24 hour(s)).  Studies/Results: No results found.   Medications:   Allergies  Allergen Reactions  . Ampicillin   . Demerol (Meperidine)      Current Facility-Administered Medications  Medication Dose Route Frequency Provider Last Rate Last Dose  . acetaminophen (TYLENOL) tablet 650 mg  650 mg Oral Q4H PRN Keitha Butte, NP      . ALPRAZolam Prudy Feeler) tablet 0.25-0.5 mg  0.25-0.5 mg Oral Q3H PRN Gwenyth Bender, MD      . alum & mag hydroxide-simeth (MAALOX/MYLANTA) 200-200-20 MG/5ML suspension 15-30 mL  15-30 mL Oral Q4H PRN Gwenyth Bender, MD      . dextrose 5 % and 0.45 % NaCl with KCl 20 mEq/L infusion   Intravenous Continuous Keitha Butte, NP 125 mL/hr at 11/26/11 0421    . diphenhydrAMINE (BENADRYL) capsule 25-50 mg  25-50 mg Oral Q4H PRN Gwenyth Bender, MD       Or  . diphenhydrAMINE (BENADRYL) injection 12.5-25 mg  12.5-25 mg Intravenous Q4H PRN Gwenyth Bender, MD   25 mg at 11/23/11 1455  . docusate sodium (COLACE) capsule 100 mg  100 mg Oral BID Gwenyth Bender, MD   100 mg at 11/26/11 1022  . enoxaparin (LOVENOX) injection 40 mg  40 mg Subcutaneous Q24H Gwenyth Bender, MD   40 mg at 11/25/11 2208  . feeding supplement (ENSURE COMPLETE) liquid 237 mL  237 mL Oral TID BM Keitha Butte, NP      . folic acid (FOLVITE) tablet 1 mg  1 mg Oral Daily Gwenyth Bender, MD   1 mg at 11/26/11 1022  . HYDROmorphone (  DILAUDID) injection 1 mg  1 mg Intravenous Q3H PRN Gwenyth Bender, MD   1 mg at 11/25/11 0913  . ibuprofen (ADVIL,MOTRIN) tablet 400 mg  400 mg Oral QID Keitha Butte, NP      . lidocaine (LIDODERM) 5 % 1 patch  1 patch Transdermal Once Keitha Butte, NP      . lidocaine (LIDODERM) 5 % 1 patch  1 patch Transdermal Daily Gwenyth Bender, MD      . mupirocin cream (BACTROBAN) 2 %   Topical BID Keitha Butte, NP      . Muscle Rub CREA   Topical TID Keitha Butte, NP      . naloxone [facility]) injection 0.4 mg  0.4 mg Intravenous PRN  Gwenyth Bender, MD   0.4 mg at 11/23/11 1716  . ondansetron (ZOFRAN) tablet 4 mg  4 mg Oral Q4H PRN Gwenyth Bender, MD       Or  . ondansetron [facility]) injection 4 mg  4 mg Intravenous Q4H PRN Gwenyth Bender, MD   4 mg at 11/25/11 1051  . oxyCODONE-acetaminophen (PERCOCET) 5-325 MG per tablet 1 tablet  1 tablet Oral Q4H PRN Ricki Rodriguez, MD   1 tablet at 11/24/11 2332  . pantoprazole (PROTONIX) EC tablet 40 mg  40 mg Oral Q1200 Keitha Butte, NP      . polyethylene glycol (MIRALAX / GLYCOLAX) packet 17 g  17 g Oral Daily Keitha Butte, NP      . potassium chloride SA (K-DUR,KLOR-CON) CR tablet 40 mEq  40 mEq Oral BID Gwenyth Bender, MD   40 mEq at 11/26/11 1022  . zolpidem (AMBIEN) tablet 5 mg  5 mg Oral QHS PRN,MR X 1 Gwenyth Bender, MD      . DISCONTD: acetaminophen (TYLENOL) tablet 650 mg  650 mg Oral Once Gwenyth Bender, MD        Assessment/Plan: Patient Active Problem List  Diagnoses  . Sickle cell anemia  . Sickle cell anemia with pain  . Ulcer - lesion  . Constipation   Sickle cell anemia with pain:  The patient will continue with mild pain management as she has delirium with strong narcotics.  Topical analgesics will be added to the pain regimen.  The patient will continue receiving pain/nausea/pruritis management.  Bowel regimen has been initiated.   Xray's or bilateral wrists and hands have been ordered.  PT is ask to evaluate and treat the patient's UE pain.  CMP, CBC, Mg, Phos, Hemoglobinopathy, ProBNP and Ferritin level in the am. Ulcers:  The patient will be started on Bactroban TID.  Wound nurse consult requested   Discussed and agreed upon plan of care with the patient.   The plan of care will be adjusted based on the patient's clinical progress.   Larina Bras, NP-C 11/26/2011, 12:45 PM

## 2011-11-27 ENCOUNTER — Inpatient Hospital Stay (HOSPITAL_COMMUNITY): Payer: Medicare Other

## 2011-11-27 DIAGNOSIS — E039 Hypothyroidism, unspecified: Secondary | ICD-10-CM

## 2011-11-27 DIAGNOSIS — R079 Chest pain, unspecified: Secondary | ICD-10-CM

## 2011-11-27 DIAGNOSIS — E079 Disorder of thyroid, unspecified: Secondary | ICD-10-CM

## 2011-11-27 LAB — TYPE AND SCREEN
ABO/RH(D): B POS
Antibody Screen: NEGATIVE
Unit division: 0
Unit division: 0
Unit division: 0
Unit division: 0

## 2011-11-27 LAB — CBC
HCT: 25.4 % — ABNORMAL LOW (ref 36.0–46.0)
Hemoglobin: 9.1 g/dL — ABNORMAL LOW (ref 12.0–15.0)
MCH: 29.8 pg (ref 26.0–34.0)
MCHC: 35.8 g/dL (ref 30.0–36.0)
MCV: 83.3 fL (ref 78.0–100.0)
Platelets: 198 10*3/uL (ref 150–400)
RBC: 3.05 MIL/uL — ABNORMAL LOW (ref 3.87–5.11)
RDW: 22.1 % — ABNORMAL HIGH (ref 11.5–15.5)
WBC: 7.5 10*3/uL (ref 4.0–10.5)

## 2011-11-27 LAB — CARDIAC PANEL(CRET KIN+CKTOT+MB+TROPI)
CK, MB: 2.3 ng/mL (ref 0.3–4.0)
CK, MB: 3 ng/mL (ref 0.3–4.0)
Relative Index: 1.4 (ref 0.0–2.5)
Relative Index: 1.6 (ref 0.0–2.5)
Total CK: 162 U/L (ref 7–177)
Total CK: 182 U/L — ABNORMAL HIGH (ref 7–177)
Troponin I: <0.3 ng/mL (ref <0.30)
Troponin I: <0.3 ng/mL (ref <0.30)

## 2011-11-27 LAB — COMPREHENSIVE METABOLIC PANEL
ALT: 19 U/L (ref 0–35)
AST: 78 U/L — ABNORMAL HIGH (ref 0–37)
Albumin: 3.5 g/dL (ref 3.5–5.2)
Alkaline Phosphatase: 65 U/L (ref 39–117)
BUN: 6 mg/dL (ref 6–23)
CO2: 23 mEq/L (ref 19–32)
Calcium: 9.5 mg/dL (ref 8.4–10.5)
Chloride: 98 mEq/L (ref 96–112)
Creatinine, Ser: 0.73 mg/dL (ref 0.50–1.10)
GFR calc Af Amer: >90 mL/min (ref >90)
GFR calc non Af Amer: >90 mL/min (ref >90)
Glucose, Bld: 98 mg/dL (ref 70–99)
Potassium: 5 mEq/L (ref 3.5–5.1)
Sodium: 131 mEq/L — ABNORMAL LOW (ref 135–145)
Total Bilirubin: 4.5 mg/dL — ABNORMAL HIGH (ref 0.3–1.2)
Total Protein: 8.2 g/dL (ref 6.0–8.3)

## 2011-11-27 LAB — FERRITIN: Ferritin: 298 ng/mL — ABNORMAL HIGH (ref 10–291)

## 2011-11-27 LAB — TSH: TSH: 245.696 u[IU]/mL — ABNORMAL HIGH (ref 0.350–4.500)

## 2011-11-27 LAB — PHOSPHORUS: Phosphorus: 3.8 mg/dL (ref 2.3–4.6)

## 2011-11-27 LAB — PRO B NATRIURETIC PEPTIDE: Pro B Natriuretic peptide (BNP): 47.5 pg/mL (ref 0–125)

## 2011-11-27 LAB — MAGNESIUM: Magnesium: 2.1 mg/dL (ref 1.5–2.5)

## 2011-11-27 LAB — D-DIMER, QUANTITATIVE (NOT AT ARMC): D-Dimer, Quant: 5.83 ug/mL-FEU — ABNORMAL HIGH (ref 0.00–0.48)

## 2011-11-27 MED ORDER — LEVOFLOXACIN IN D5W 750 MG/150ML IV SOLN
750.0000 mg | INTRAVENOUS | Status: DC
  Administered 2011-11-27 – 2011-11-29 (×3): 750 mg via INTRAVENOUS
  Filled 2011-11-27 (×4): qty 150

## 2011-11-27 MED ORDER — LEVOTHYROXINE SODIUM 50 MCG PO TABS
50.0000 ug | ORAL_TABLET | Freq: Every day | ORAL | Status: DC
  Administered 2011-11-28 – 2011-11-30 (×3): 50 ug via ORAL
  Filled 2011-11-27 (×4): qty 1

## 2011-11-27 MED ORDER — IOHEXOL 300 MG/ML  SOLN
100.0000 mL | Freq: Once | INTRAMUSCULAR | Status: AC | PRN
  Administered 2011-11-27: 100 mL via INTRAVENOUS

## 2011-11-27 MED ORDER — DOCUSATE SODIUM 100 MG PO CAPS
100.0000 mg | ORAL_CAPSULE | Freq: Two times a day (BID) | ORAL | Status: DC
  Administered 2011-11-27 – 2011-11-29 (×5): 100 mg via ORAL
  Filled 2011-11-27 (×6): qty 1

## 2011-11-27 MED ORDER — VITAMIN C 500 MG PO TABS
500.0000 mg | ORAL_TABLET | Freq: Every day | ORAL | Status: DC
  Administered 2011-11-27 – 2011-11-30 (×4): 500 mg via ORAL
  Filled 2011-11-27 (×5): qty 1

## 2011-11-27 MED ORDER — LEVALBUTEROL HCL 0.63 MG/3ML IN NEBU
0.6300 mg | INHALATION_SOLUTION | Freq: Two times a day (BID) | RESPIRATORY_TRACT | Status: DC
  Administered 2011-11-27 – 2011-11-30 (×6): 0.63 mg via RESPIRATORY_TRACT
  Filled 2011-11-27 (×9): qty 3

## 2011-11-27 MED ORDER — DICLOFENAC SODIUM 1 % TD GEL
Freq: Four times a day (QID) | TRANSDERMAL | Status: DC
  Administered 2011-11-27 (×3): via TOPICAL
  Administered 2011-11-27: 1 via TOPICAL
  Administered 2011-11-28 – 2011-11-30 (×9): via TOPICAL
  Filled 2011-11-27: qty 100

## 2011-11-27 MED ORDER — MUPIROCIN 2 % EX OINT
TOPICAL_OINTMENT | Freq: Two times a day (BID) | CUTANEOUS | Status: DC
  Administered 2011-11-27 – 2011-11-30 (×7): via TOPICAL

## 2011-11-27 MED ORDER — ASPIRIN EC 81 MG PO TBEC
81.0000 mg | DELAYED_RELEASE_TABLET | Freq: Every day | ORAL | Status: DC
  Administered 2011-11-27 – 2011-11-29 (×3): 81 mg via ORAL
  Filled 2011-11-27 (×3): qty 1

## 2011-11-27 MED ORDER — MUPIROCIN 2 % EX OINT: TOPICAL_OINTMENT | Freq: Two times a day (BID) | CUTANEOUS | Status: DC

## 2011-11-27 NOTE — Consult Note (Signed)
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 Wound bed: covered with dried serum; darkly pigmented surrounding area Drainage (amount, consistency, odor) none at this time Periwound:as noted above.  No induration, erythema or warmth Dressing procedure/placement/frequency: NP has started topical mupirocin and I am in agreement with that at this time, twice daily.  At present, there is no need for a topical dressing, but should the serous fluid become greater in quantity or if patient disturbs these areas, you might consider a thin film transparent dressing (Tegaderm) that can be left in place for 5-7 days. I will not follow.  Please re-consult if needed. Thanks, Ladona Mow, MSN, RN, [facility], CWOCN [phone])

## 2011-11-27 NOTE — Progress Notes (Signed)
ANTIBIOTIC CONSULT NOTE - INITIAL  Pharmacy Consult for Levaquin Indication: Possible PNA  Allergies  Allergen Reactions  . Ampicillin   . Demerol (Meperidine)     Patient Measurements: Height: 5\' 11"  (180.3 cm) Weight: 153 lb 8 oz (69.627 kg) IBW/kg (Calculated) : 70.8   Vital Signs: Temp: 98.6 F (37 C) (05/07 0607) Temp src: Oral (05/07 0607) BP: 118/66 mmHg (05/07 0607) Pulse Rate: 62  (05/07 0607) Intake/Output from previous day: 05/06 0701 - 05/07 0700 In: 2311 [P.O.:240; I.V.:2071] Out: 5350 [Urine:5350] Intake/Output from this shift:    Labs:  Basename 11/27/11 0640 11/25/11 1204  WBC 7.5 9.3  HGB 9.1* 8.8*  PLT 198 207  LABCREA -- --  CREATININE 0.73 --   Estimated Creatinine Clearance: 91.4 ml/min (by C-G formula based on Cr of 0.73). No results found for this basename: VANCOTROUGH:2,VANCOPEAK:2,VANCORANDOM:2,GENTTROUGH:2,GENTPEAK:2,GENTRANDOM:2,TOBRATROUGH:2,TOBRAPEAK:2,TOBRARND:2,AMIKACINPEAK:2,AMIKACINTROU:2,AMIKACIN:2, in the last 72 hours   Microbiology: No results found for this or any previous visit (from the past 720 hour(s)).  Medical History: Past Medical History  Diagnosis Date  . Sickle cell anemia     Medications:  Anti-infectives    None     Assessment: 51 yo F with sickle cell anemia to start empiric Levaquin for possible PNA seen on chest CT.  Plan:  Levaquin 750mg  IV q24h  Darrol Angel, PharmD Pager: [phone] 11/27/2011,2:22 PM

## 2011-11-27 NOTE — Progress Notes (Signed)
*  PRELIMINARY RESULTS* Echocardiogram 2D Echocardiogram has been performed.  Katheren Puller 11/27/2011, 3:35 PM

## 2011-11-27 NOTE — Progress Notes (Signed)
Subjective: The patient was seen on rounds today.  The patient was sitting quietly in her bed, eating a meal tray.  The patient is complaining of pain 7/10 in her bilateral hand and wrist areas.  The patient states that she also had a episode of chest pain today that she described as "a throbbing pain like having a toothache in your chest."  The patient stated that her chest pain spontaneously resolved.  The patient stated that she has been having intermittent chest pain for the past 3-4 months and usually she has a sharp. shooting pain.  Discussed the patient's xray's with her.  The patient has a PT, OT and Wound Care consult pending.  No other nursing or patient concerns.  Objective: Vital signs in last 24 hours: Blood pressure 118/66, pulse 62, temperature 98.6 F (37 C), temperature source Oral, resp. rate 16, height 5\' 11"  (1.803 m), weight 153 lb 8 oz (69.627 kg), SpO2 95.00%.  General Appearance: Alert, cooperative, appears stated age, well nourished, well developed, mild distress Head: Normocephalic, without obvious abnormality, atraumatic Eyes: PERRLA, EOMI, scleral icterus Nose: Nares, septum and mucosa are normal, no drainage or sinus tenderness Throat: Lips, mucosa, gums and tongue are normal, patient has upper dentures, lower dentition is in fair condition Neck: No adenopathy, supple, symmetrical, trachea midline and thyroid not enlarged, symmetric, no tenderness Back: Symmetric, no curvature, normal ROM, no CVA tenderness Resp: Diminished bibasilar breath sounds, CTA, no wheezes/rales/rhonchi Cardio: Regular rate and rhythm, S1, S2 normal, no murmur/click/rub/gallop GI: Soft, non-tender; bowel sounds are hypoactive, no masses,  no organomegaly Extremities: Painful and tender UE, atraumatic, trace UE edema, Homans sign is negative, no sign of DVT, bilateral LE ulcers Pulses: 2+ and symmetric Skin: Bilateral LE ulcers in various stages of healing and bilateral UE lesions/ulcers in  various stages of healing Neurologic: Grossly normal, CN II - XII intact, no focal deficits Psych:  Flat affect  Lab Results: Results for orders placed during the hospital encounter of 11/23/11 (from the past 24 hour(s))  MAGNESIUM     Status: Normal   Collection Time   11/27/11  6:40 AM      Component Value Range   Magnesium 2.1  1.5 - 2.5 (mg/dL)  PHOSPHORUS     Status: Normal   Collection Time   11/27/11  6:40 AM      Component Value Range   Phosphorus 3.8  2.3 - 4.6 (mg/dL)  COMPREHENSIVE METABOLIC PANEL     Status: Abnormal   Collection Time   11/27/11  6:40 AM      Component Value Range   Sodium 131 (*) 135 - 145 (mEq/L)   Potassium 5.0  3.5 - 5.1 (mEq/L)   Chloride 98  96 - 112 (mEq/L)   CO2 23  19 - 32 (mEq/L)   Glucose, Bld 98  70 - 99 (mg/dL)   BUN 6  6 - 23 (mg/dL)   Creatinine, Ser 9.60  0.50 - 1.10 (mg/dL)   Calcium 9.5  8.4 - 45.4 (mg/dL)   Total Protein 8.2  6.0 - 8.3 (g/dL)   Albumin 3.5  3.5 - 5.2 (g/dL)   AST 78 (*) 0 - 37 (U/L)   ALT 19  0 - 35 (U/L)   Alkaline Phosphatase 65  39 - 117 (U/L)   Total Bilirubin 4.5 (*) 0.3 - 1.2 (mg/dL)   GFR calc non Af Amer >90  >90 (mL/min)   GFR calc Af Amer >90  >90 (mL/min)  CBC  Status: Abnormal   Collection Time   11/27/11  6:40 AM      Component Value Range   WBC 7.5  4.0 - 10.5 (K/uL)   RBC 3.05 (*) 3.87 - 5.11 (MIL/uL)   Hemoglobin 9.1 (*) 12.0 - 15.0 (g/dL)   HCT 13.2 (*) 44.0 - 46.0 (%)   MCV 83.3  78.0 - 100.0 (fL)   MCH 29.8  26.0 - 34.0 (pg)   MCHC 35.8  30.0 - 36.0 (g/dL)   RDW 10.2 (*) 72.5 - 15.5 (%)   Platelets 198  150 - 400 (K/uL)  PRO B NATRIURETIC PEPTIDE     Status: Normal   Collection Time   11/27/11  6:40 AM      Component Value Range   Pro B Natriuretic peptide (BNP) 47.5  0 - 125 (pg/mL)    Studies/Results: Dg Wrist Complete Left  11/26/2011  *RADIOLOGY REPORT*  Clinical Data: Pain.  History of sickle cell disease.  Swelling. No injury.  LEFT WRIST - COMPLETE 3+ VIEW  Comparison: Right  examination of same date.  Findings: Alignment is normal.  Joint spaces are preserved.  No fracture or dislocation is evident.  No soft tissue lesions are seen.  IMPRESSION: No fracture or bony destruction is seen.  Original Report Authenticated By: Crawford Givens, M.D.   Dg Wrist Complete Right  11/26/2011  *RADIOLOGY REPORT*  Clinical Data: Pain and swelling.  History of sickle cell disease. No history of injury.  RIGHT WRIST - COMPLETE 3+ VIEW  Comparison: Left examination of same date  Findings: Alignment is normal.  Joint spaces are preserved.  No fracture or dislocation is evident.  No soft tissue lesions are seen.  IMPRESSION: No fracture or bony destruction.  Original Report Authenticated By: Crawford Givens, M.D.   Dg Hand Complete Left  11/26/2011  *RADIOLOGY REPORT*  Clinical Data: History of pain and swelling.  History of sickle cell disease.  No known injury.  LEFT HAND - COMPLETE 3+ VIEW  Comparison: Right examination of same date.  Findings: There is erosive articular change involving the PIP joint area of the third finger with surrounding soft tissue swelling. No other significant joint lesions are seen.  No fracture dislocation is seen.  IMPRESSION: Soft tissue swelling of the third finger centered on the PIP joint. Erosive changes involve the articular surfaces of these PIP joint of the third finger.  There is some osteophyte formation.  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  Infection cannot be excluded.  Gout cannot be excluded.  Original Report Authenticated By: Crawford Givens, M.D.   Dg Hand Complete Right  11/26/2011  *RADIOLOGY REPORT*  Clinical Data: Pain and swelling.  History of sickle cell disease.  RIGHT HAND - COMPLETE 3+ VIEW  Comparison: Left hand examination.  Findings: Alignment is normal.  Joint spaces are preserved.  No fracture or dislocation is evident.  No soft tissue lesions are seen.  IMPRESSION: No abnormality of right hand identified.  Original Report Authenticated By:  Crawford Givens, M.D.     Medications:   Allergies  Allergen Reactions  . Ampicillin   . Demerol (Meperidine)      Current Facility-Administered Medications  Medication Dose Route Frequency Provider Last Rate Last Dose  . acetaminophen (TYLENOL) tablet 650 mg  650 mg Oral Q4H PRN Keitha Butte, NP      . ALPRAZolam Prudy Feeler) tablet 0.25-0.5 mg  0.25-0.5 mg Oral Q3H PRN Gwenyth Bender, MD      . alum &  mag hydroxide-simeth (MAALOX/MYLANTA) 200-200-20 MG/5ML suspension 15-30 mL  15-30 mL Oral Q4H PRN Gwenyth Bender, MD      . aspirin EC tablet 81 mg  81 mg Oral Daily Keitha Butte, NP   81 mg at 11/27/11 1027  . dextrose 5 % and 0.45 % NaCl with KCl 20 mEq/L infusion   Intravenous Continuous Keitha Butte, NP 75 mL/hr at 11/27/11 0629    . diclofenac sodium (VOLTAREN) 1 % transdermal gel   Topical QID Keitha Butte, NP      . diphenhydrAMINE (BENADRYL) capsule 25-50 mg  25-50 mg Oral Q4H PRN Gwenyth Bender, MD       Or  . diphenhydrAMINE (BENADRYL) injection 12.5-25 mg  12.5-25 mg Intravenous Q4H PRN Gwenyth Bender, MD   25 mg at 11/23/11 1455  . docusate sodium (COLACE) capsule 100 mg  100 mg Oral BID Gwenyth Bender, MD   100 mg at 11/27/11 1027  . enoxaparin (LOVENOX) injection 40 mg  40 mg Subcutaneous Q24H Gwenyth Bender, MD   40 mg at 11/26/11 2019  . feeding supplement (ENSURE COMPLETE) liquid 237 mL  237 mL Oral TID BM Keitha Butte, NP   237 mL at 11/27/11 1400  . folic acid (FOLVITE) tablet 1 mg  1 mg Oral Daily Gwenyth Bender, MD   1 mg at 11/27/11 0943  . HYDROmorphone (DILAUDID) injection 1 mg  1 mg Intravenous Q3H PRN Gwenyth Bender, MD   1 mg at 11/25/11 0913  . ibuprofen (ADVIL,MOTRIN) tablet 400 mg  400 mg Oral QID Keitha Butte, NP   400 mg at 11/27/11 1447  . iohexol (OMNIPAQUE) 300 MG/ML solution 100 mL  100 mL Intravenous Once PRN Medication Radiologist, MD   100 mL at 11/27/11 1217  . iohexol (OMNIPAQUE) 300 MG/ML solution 100 mL  100 mL Intravenous Once PRN  Medication Radiologist, MD   100 mL at 11/27/11 1227  . levalbuterol (XOPENEX) nebulizer solution 0.63 mg  0.63 mg Nebulization Q12H Keitha Butte, NP      . levofloxacin (LEVAQUIN) IVPB 750 mg  750 mg Intravenous Q24H Annia Belt, PHARMD   750 mg at 11/27/11 1640  . levothyroxine (SYNTHROID, LEVOTHROID) tablet 50 mcg  50 mcg Oral QAC breakfast Keitha Butte, NP      . lidocaine (LIDODERM) 5 % 1 patch  1 patch Transdermal Daily Gwenyth Bender, MD   1 patch at 11/26/11 2246  . mupirocin ointment (BACTROBAN) 2 %   Topical BID Keitha Butte, NP      . Muscle Rub CREA   Topical TID Keitha Butte, NP      . naloxone [facility]) injection 0.4 mg  0.4 mg Intravenous PRN Gwenyth Bender, MD   0.4 mg at 11/23/11 1716  . ondansetron (ZOFRAN) tablet 4 mg  4 mg Oral Q4H PRN Gwenyth Bender, MD       Or  . ondansetron [facility]) injection 4 mg  4 mg Intravenous Q4H PRN Gwenyth Bender, MD   4 mg at 11/25/11 1051  . oxyCODONE-acetaminophen (PERCOCET) 5-325 MG per tablet 1 tablet  1 tablet Oral Q4H PRN Ricki Rodriguez, MD   1 tablet at 11/27/11 1325  . pantoprazole (PROTONIX) EC tablet 40 mg  40 mg Oral Q1200 Keitha Butte, NP   40 mg at 11/27/11 1100  . polyethylene glycol (MIRALAX / GLYCOLAX) packet 17 g  17 g Oral  Daily Keitha Butte, NP   17 g at 11/27/11 0949  . vitamin C (ASCORBIC ACID) tablet 500 mg  500 mg Oral Daily Keitha Butte, NP   500 mg at 11/27/11 1447  . zolpidem (AMBIEN) tablet 5 mg  5 mg Oral QHS PRN,MR X 1 Gwenyth Bender, MD      . DISCONTD: docusate sodium (COLACE) capsule 100 mg  100 mg Oral BID Gwenyth Bender, MD   100 mg at 11/26/11 2246  . DISCONTD: mupirocin cream (BACTROBAN) 2 %   Topical BID Keitha Butte, NP      . DISCONTD: mupirocin ointment (BACTROBAN) 2 %   Nasal BID Keitha Butte, NP      . DISCONTD: potassium chloride SA (K-DUR,KLOR-CON) CR tablet 40 mEq  40 mEq Oral BID Gwenyth Bender, MD   40 mEq at 11/27/11 0944     Assessment/Plan: Patient Active Problem List  Diagnoses  . Sickle cell anemia  . Sickle cell anemia with pain  . Ulcer - lesion  . Constipation  . Decreased appetite  . Chest pain  . Hypothyroidism   Sickle cell anemia with pain:  The patient will continue with mild pain management as she has delirium with strong narcotics. The patient is also receiving topical analgesics. The patient will continue receiving pain/nausea/pruritis management/bowel.   PT/OT is ask to evaluate and treat the patient's UE pain.  Hemoglobinopathy and Ferritin are pending.  CMP and CBC in the am.  Ulcers:  The patient will be started on Bactroban TID.  Wound nurse consult requested. Chest pain:  Serial cardiac enzymes ordered, the patient will be started on ASA regimen,   2D Echo ordered, fasting lipid profile also ordered.  EKG is unremarkable.   D-Dimer is elevated - CTA Chest states that the patient does not have a PE, but might have pneumonia.  Pharmacy consulted for Levaquin dosing.  Nebulizer treatments have been scheduled and increased IS usage encouraged. Hypothyroidism:  The patient started on Levothyroxine, Free T4 in the am  Discussed and agreed upon plan of care with the patient.   The plan of care will be adjusted based on the patient's clinical progress.  Larina Bras, NP-C 11/27/2011, 9:26 AM

## 2011-11-27 NOTE — Progress Notes (Signed)
PT Cancellation Note  Treatment cancelled today due to patient receiving procedure or test.  Pt transferred to telemetry to day and was received a test in the room when I went up to see her. Will try again for PT eval in AM  Donnetta Hail 11/27/2011, 3:40 PM

## 2011-11-28 LAB — COMPREHENSIVE METABOLIC PANEL
ALT: 22 U/L (ref 0–35)
AST: 87 U/L — ABNORMAL HIGH (ref 0–37)
Albumin: 3.6 g/dL (ref 3.5–5.2)
Alkaline Phosphatase: 68 U/L (ref 39–117)
BUN: 7 mg/dL (ref 6–23)
CO2: 24 mEq/L (ref 19–32)
Calcium: 9.7 mg/dL (ref 8.4–10.5)
Chloride: 97 mEq/L (ref 96–112)
Creatinine, Ser: 0.86 mg/dL (ref 0.50–1.10)
GFR calc Af Amer: 89 mL/min — ABNORMAL LOW (ref >90)
GFR calc non Af Amer: 77 mL/min — ABNORMAL LOW (ref >90)
Glucose, Bld: 105 mg/dL — ABNORMAL HIGH (ref 70–99)
Potassium: 4.4 mEq/L (ref 3.5–5.1)
Sodium: 130 mEq/L — ABNORMAL LOW (ref 135–145)
Total Bilirubin: 3.2 mg/dL — ABNORMAL HIGH (ref 0.3–1.2)
Total Protein: 8.4 g/dL — ABNORMAL HIGH (ref 6.0–8.3)

## 2011-11-28 LAB — CBC
HCT: 25.2 % — ABNORMAL LOW (ref 36.0–46.0)
Hemoglobin: 9.2 g/dL — ABNORMAL LOW (ref 12.0–15.0)
MCH: 30.2 pg (ref 26.0–34.0)
MCHC: 36.5 g/dL — ABNORMAL HIGH (ref 30.0–36.0)
MCV: 82.6 fL (ref 78.0–100.0)
Platelets: 206 10*3/uL (ref 150–400)
RBC: 3.05 MIL/uL — ABNORMAL LOW (ref 3.87–5.11)
RDW: 20.3 % — ABNORMAL HIGH (ref 11.5–15.5)
WBC: 7.8 10*3/uL (ref 4.0–10.5)

## 2011-11-28 LAB — CARDIAC PANEL(CRET KIN+CKTOT+MB+TROPI)
CK, MB: 2.8 ng/mL (ref 0.3–4.0)
CK, MB: 2.5 ng/mL (ref 0.3–4.0)
CK, MB: 2.5 ng/mL (ref 0.3–4.0)
Relative Index: 1.7 (ref 0.0–2.5)
Relative Index: 1.5 (ref 0.0–2.5)
Relative Index: 1.5 (ref 0.0–2.5)
Total CK: 168 U/L (ref 7–177)
Total CK: 162 U/L (ref 7–177)
Total CK: 163 U/L (ref 7–177)
Troponin I: <0.3 ng/mL (ref <0.30)
Troponin I: <0.3 ng/mL (ref <0.30)
Troponin I: <0.3 ng/mL (ref <0.30)

## 2011-11-28 LAB — LIPID PANEL
Cholesterol: 210 mg/dL — ABNORMAL HIGH (ref 0–200)
HDL: 33 mg/dL — ABNORMAL LOW (ref >39)
LDL Cholesterol: 146 mg/dL — ABNORMAL HIGH (ref 0–99)
Total CHOL/HDL Ratio: 6.4 RATIO
Triglycerides: 157 mg/dL — ABNORMAL HIGH (ref <150)
VLDL: 31 mg/dL (ref 0–40)

## 2011-11-28 LAB — T4, FREE: Free T4: 0.16 ng/dL — ABNORMAL LOW (ref 0.80–1.80)

## 2011-11-28 NOTE — Evaluation (Signed)
Physical Therapy Evaluation Patient Details Name: [name] MRN: [id number] DOB: 10-02-1960 Today's Date: 11/28/2011 Time: 7829-5621 PT Time Calculation (min): 30 min  PT Assessment / Plan / Recommendation Clinical Impression  Pt presents with diagnosis of Ramirez-Perez anemia with pain. Pt currently experiencing pain in bil hands, wrists. Also demonstrating decreased balance and activity tolerance. Plan to perform trial of whirlpool tank as therapeutic modality to treat pain in hands,wrists. Will also follow for mobility purposes.    PT Assessment  Patient needs continued PT services    Follow Up Recommendations  Home health PT    Equipment Recommendations   (to be determined)    Frequency Min 3X/week    Precautions / Restrictions Restrictions Weight Bearing Restrictions: No   Pertinent Vitals/Pain       Mobility  Bed Mobility Bed Mobility: Supine to Sit Supine to Sit: 6: Modified independent (Device/Increase time) Transfers Transfers: Sit to Stand;Stand to Sit Sit to Stand: 5: Supervision Stand to Sit: 5: Supervision Details for Transfer Assistance: VCs safety Ambulation/Gait Ambulation/Gait Assistance: 4: Min assist Ambulation Distance (Feet): 150 Feet Assistive device: None (pushing iv pole) Gait Pattern: Step-through pattern;Decreased stride length;Decreased step length - right;Decreased step length - left    Exercises     PT Goals Acute Rehab PT Goals PT Goal Formulation: With patient Time For Goal Achievement: 12/12/11 Potential to Achieve Goals: Good Pt will go Sit to Stand: with modified independence PT Goal: Sit to Stand - Progress: Goal set today Pt will go Stand to Sit: with modified independence PT Goal: Stand to Sit - Progress: Goal set today Pt will Ambulate: >150 feet;with modified independence;with least restrictive assistive device PT Goal: Ambulate - Progress: Goal set today Pt will Go Up / Down Stairs: Flight;with modified independence;with  rail(s) PT Goal: Up/Down Stairs - Progress: Goal set today Pt will Perform Home Exercise Program: with supervision, verbal cues required/provided PT Goal: Perform Home Exercise Program - Progress: Goal set today  Visit Information  Last PT Received On: 11/28/11 Assistance Needed: +1    Subjective Data  Subjective: "they just hurt" Patient Stated Goal: Less pain   Prior Functioning  Home Living Lives With: Daughter Type of Home: Apartment Home Access: Stairs to enter Secretary/administrator of Steps: 1 flight Entrance Stairs-Rails: Right Home Layout: One level Bathroom Shower/Tub: Engineer, manufacturing systems: Standard Home Adaptive Equipment: None Prior Function Level of Independence: Independent Able to Take Stairs?: Yes Communication Communication: No difficulties    Cognition  Overall Cognitive Status: Appears within functional limits for tasks assessed/performed Arousal/Alertness: Awake/alert Orientation Level: Appears intact for tasks assessed Behavior During Session: [facility] for tasks performed Cognition - Other Comments: slightly slow to respond to questions    Extremity/Trunk Assessment Right Lower Extremity Assessment RLE ROM/Strength/Tone: Deficits RLE ROM/Strength/Tone Deficits: Strength at least 4/5 with functional activity Left Lower Extremity Assessment LLE ROM/Strength/Tone: Deficits LLE ROM/Strength/Tone Deficits: strength at least 4/5 with functional activity Trunk Assessment Trunk Assessment: Normal   Balance Balance Balance Assessed: Yes Static Sitting Balance Static Sitting - Balance Support: Bilateral upper extremity supported Static Sitting - Level of Assistance: 5: Stand by assistance Dynamic Sitting Balance Dynamic Sitting - Balance Support: Bilateral upper extremity supported Dynamic Sitting - Level of Assistance: 5: Stand by assistance  End of Session PT - End of Session Equipment Utilized During Treatment: Gait belt Activity Tolerance:  Patient limited by fatigue Patient left: in chair;with call bell/phone within reach   Rebeca Alert [facility] 11/28/2011, 11:41 AM [phone]

## 2011-11-28 NOTE — Evaluation (Signed)
Occupational Therapy Evaluation Patient Details Name: [name] MRN: [id number] DOB: August 16, 1960 Today's Date: 11/28/2011 Time: 0865-7846 OT Time Calculation (min): 30 min  OT Assessment / Plan / Recommendation Clinical Impression  Pt is a 51 year old woman with sickle cell who is currently experiencing pain in her hands and wrists.  Hand function is impaired by pain and joint changes.  Will follow pt acutely for education in adaptive equipment, joint protection, and energy conservation.  Recommend HHOT upon d/c.      OT Assessment  Patient needs continued OT Services    Follow Up Recommendations  Home health OT    Equipment Recommendations   (to be determined)    Frequency Min 2X/week    Precautions / Restrictions Precautions Precautions: Fall Restrictions Weight Bearing Restrictions: No        ADL  Eating/Feeding: Set up;Simulated;Other (comment) (difficulty opening packages) Where Assessed - Eating/Feeding: Bed level Grooming: Simulated;Other (comment);Supervision/safety (pt with difficulty grasping hygiene items) Where Assessed - Grooming: Standing at sink Upper Body Bathing: Simulated;Set up Where Assessed - Upper Body Bathing: Sitting, bed Lower Body Bathing: Simulated;Supervision/safety Where Assessed - Lower Body Bathing: Sitting, bed;Sit to stand from bed Upper Body Dressing: Performed;Set up Where Assessed - Upper Body Dressing: Sitting, bed Lower Body Dressing: Performed;Supervision/safety Where Assessed - Lower Body Dressing: Sitting, bed;Sit to stand from bed Toilet Transfer: Simulated;Minimal assistance Toilet Transfer Method: Ambulating Toileting - Clothing Manipulation: Supervision/safety Where Assessed - Toileting Clothing Manipulation: Standing Toileting - Hygiene: Simulated;Set up Where Assessed - Toileting Hygiene: Sit on 3-in-1 or toilet Equipment Used: Gait belt Ambulation Related to ADLs: Pt ambulated with min assist holding IV pole. ADL  Comments: Pt's painful and stiff hands interferewith ability to manage packages, write, and manipulate fasteners.  PT will see pt for whirlpool treatment this afternoon.  Discussed with pt the benefits of moist heat for pain management accompanied by AROM.  Also began education in joint protection and adaptive equipment.    OT Goals Acute Rehab OT Goals OT Goal Formulation: With patient Time For Goal Achievement: 12/05/11 Potential to Achieve Goals: Good ADL Goals Pt Will Perform Tub/Shower Transfer: Tub transfer;with supervision;Ambulation;Other (comment) (determine equipment needs for safety) ADL Goal: Tub/Shower Transfer - Progress: Goal set today Miscellaneous OT Goals Miscellaneous OT Goal #1: Pt will state at least 3 energy conservation strategies. OT Goal: Miscellaneous Goal #1 - Progress: Goal set today Miscellaneous OT Goal #2: Pt will be knowledgeable in joint protection techniques. OT Goal: Miscellaneous Goal #2 - Progress: Goal set today Miscellaneous OT Goal #3: Pt will be aware of available adaptive equipment to assist with ADL given pain and limitations in her hands. OT Goal: Miscellaneous Goal #3 - Progress: Goal set today  Visit Information  Last OT Received On: 11/28/11 Assistance Needed: +1 PT/OT Co-Evaluation/Treatment: Yes    Subjective Data  Subjective: "My hands and wrists hurt."   Prior Functioning  Home Living Lives With: Daughter Available Help at Discharge: Other (Comment) (daughter is not reliable) Type of Home: Apartment Home Access: Stairs to enter Entrance Stairs-Number of Steps: 1 flight Entrance Stairs-Rails: Right Home Layout: One level Bathroom Shower/Tub: Engineer, manufacturing systems: Standard Home Adaptive Equipment: None Prior Function Level of Independence: Independent Able to Take Stairs?: Yes Driving: No Vocation: On disability Communication Communication: No difficulties Dominant Hand: Right    Cognition  Overall Cognitive  Status: Appears within functional limits for tasks assessed/performed Arousal/Alertness: Awake/alert Orientation Level: Appears intact for tasks assessed Behavior During Session: [facility] for tasks performed  Cognition - Other Comments: slightly slow to respond to questions    Extremity/Trunk Assessment Right Upper Extremity Assessment RUE ROM/Strength/Tone: Unable to fully assess;Due to pain (pain and joint changes in bilateral hands) RUE Coordination: Deficits Left Upper Extremity Assessment LUE ROM/Strength/Tone: Unable to fully assess;Due to pain LUE Coordination: Deficits Right Lower Extremity Assessment RLE ROM/Strength/Tone: Deficits RLE ROM/Strength/Tone Deficits: Strength at least 4/5 with functional activity Left Lower Extremity Assessment LLE ROM/Strength/Tone: Deficits LLE ROM/Strength/Tone Deficits: strength at least 4/5 with functional activity Trunk Assessment Trunk Assessment: Normal   Mobility Bed Mobility Bed Mobility: Supine to Sit Supine to Sit: 6: Modified independent (Device/Increase time) Sitting - Scoot to Edge of Bed: 6: Modified independent (Device/Increase time) Transfers Transfers: Sit to Stand;Stand to Sit Sit to Stand: 5: Supervision Stand to Sit: 5: Supervision Details for Transfer Assistance: VCs safety       Balance Balance Balance Assessed: Yes Static Sitting Balance Static Sitting - Balance Support: Bilateral upper extremity supported Static Sitting - Level of Assistance: 5: Stand by assistance Dynamic Sitting Balance Dynamic Sitting - Balance Support: Bilateral upper extremity supported Dynamic Sitting - Level of Assistance: 5: Stand by assistance  End of Session OT - End of Session Activity Tolerance: Patient limited by fatigue Patient left: in chair;with call bell/phone within reach   Evern Bio 11/28/2011, 11:42 AM [phone]

## 2011-11-28 NOTE — Progress Notes (Signed)
Subjective:  Patient feeling better today. She's not experienced any chest pains. She continues to have pain in her hands. Present measures are helping. She rates her pain as a 6-7/10. She denies any shortness of breath. Denies abdominal pain. Appetite is fair. She's not having visitors. She has a 52 year old daughter with whom she lives. Discussed with patient and her diagnoses of hypothyroidism. She has not been aware of this in the past.   Allergies  Allergen Reactions  . Ampicillin   . Demerol (Meperidine)    Current Facility-Administered Medications  Medication Dose Route Frequency Provider Last Rate Last Dose  . acetaminophen (TYLENOL) tablet 650 mg  650 mg Oral Q4H PRN Keitha Butte, NP      . ALPRAZolam Prudy Feeler) tablet 0.25-0.5 mg  0.25-0.5 mg Oral Q3H PRN Gwenyth Bender, MD      . alum & mag hydroxide-simeth (MAALOX/MYLANTA) 200-200-20 MG/5ML suspension 15-30 mL  15-30 mL Oral Q4H PRN Gwenyth Bender, MD      . aspirin EC tablet 81 mg  81 mg Oral Daily Keitha Butte, NP   81 mg at 11/28/11 0954  . dextrose 5 % and 0.45 % NaCl with KCl 20 mEq/L infusion   Intravenous Continuous Keitha Butte, NP 75 mL/hr at 11/27/11 2331    . diclofenac sodium (VOLTAREN) 1 % transdermal gel   Topical QID Keitha Butte, NP      . diphenhydrAMINE (BENADRYL) capsule 25-50 mg  25-50 mg Oral Q4H PRN Gwenyth Bender, MD       Or  . diphenhydrAMINE (BENADRYL) injection 12.5-25 mg  12.5-25 mg Intravenous Q4H PRN Gwenyth Bender, MD   25 mg at 11/23/11 1455  . docusate sodium (COLACE) capsule 100 mg  100 mg Oral BID Gwenyth Bender, MD   100 mg at 11/28/11 0953  . enoxaparin (LOVENOX) injection 40 mg  40 mg Subcutaneous Q24H Gwenyth Bender, MD   40 mg at 11/27/11 2118  . feeding supplement (ENSURE COMPLETE) liquid 237 mL  237 mL Oral TID BM Keitha Butte, NP   237 mL at 11/28/11 0955  . folic acid (FOLVITE) tablet 1 mg  1 mg Oral Daily Gwenyth Bender, MD   1 mg at 11/28/11 0954  . HYDROmorphone (DILAUDID)  injection 1 mg  1 mg Intravenous Q3H PRN Gwenyth Bender, MD   1 mg at 11/25/11 0913  . ibuprofen (ADVIL,MOTRIN) tablet 400 mg  400 mg Oral QID Keitha Butte, NP   400 mg at 11/28/11 1409  . levalbuterol (XOPENEX) nebulizer solution 0.63 mg  0.63 mg Nebulization Q12H Keitha Butte, NP   0.63 mg at 11/28/11 0818  . levofloxacin (LEVAQUIN) IVPB 750 mg  750 mg Intravenous Q24H Annia Belt, PHARMD   750 mg at 11/27/11 1640  . levothyroxine (SYNTHROID, LEVOTHROID) tablet 50 mcg  50 mcg Oral QAC breakfast Keitha Butte, NP   50 mcg at 11/28/11 0954  . lidocaine (LIDODERM) 5 % 1 patch  1 patch Transdermal Daily Gwenyth Bender, MD   1 patch at 11/27/11 2104  . mupirocin ointment (BACTROBAN) 2 %   Topical BID Keitha Butte, NP      . Muscle Rub CREA   Topical TID Keitha Butte, NP      . naloxone [facility]) injection 0.4 mg  0.4 mg Intravenous PRN Gwenyth Bender, MD   0.4 mg at 11/23/11 1716  . ondansetron (ZOFRAN) tablet 4 mg  4 mg Oral Q4H PRN Gwenyth Bender, MD       Or  . ondansetron [facility]) injection 4 mg  4 mg Intravenous Q4H PRN Gwenyth Bender, MD   4 mg at 11/27/11 2104  . oxyCODONE-acetaminophen (PERCOCET) 5-325 MG per tablet 1 tablet  1 tablet Oral Q4H PRN Ricki Rodriguez, MD   1 tablet at 11/27/11 1325  . pantoprazole (PROTONIX) EC tablet 40 mg  40 mg Oral Q1200 Keitha Butte, NP   40 mg at 11/28/11 1220  . polyethylene glycol (MIRALAX / GLYCOLAX) packet 17 g  17 g Oral Daily Keitha Butte, NP   17 g at 11/28/11 0956  . vitamin C (ASCORBIC ACID) tablet 500 mg  500 mg Oral Daily Keitha Butte, NP   500 mg at 11/28/11 0954  . zolpidem (AMBIEN) tablet 5 mg  5 mg Oral QHS PRN,MR X 1 Gwenyth Bender, MD      . DISCONTD: potassium chloride SA (K-DUR,KLOR-CON) CR tablet 40 mEq  40 mEq Oral BID Gwenyth Bender, MD   40 mEq at 11/27/11 0944    Objective: Blood pressure 116/63, pulse 67, temperature 98.2 F (36.8 C), temperature source Oral, resp. rate 18, height 5\' 11"   (1.803 m), weight 153 lb 8 oz (69.627 kg), SpO2 96.00%.  Well-developed well-nourished black female presently in no acute distress. Patient appears more alert than before. No sinus tenderness. Positive sclera icterus. HEENT: No sinus tenderness. Mild scleral icterus. NECK: No posterior cervical nodes. LUNGS: Bibasilar crackles. No wheezes. No vocal fremitus. CV: Normal S1, S2 without S3. ABD: No epigastric tenderness. MSK: Tenderness in PIP joints the hands. Presently without increased warmth. Mild swelling persists. Negative Homans. NEURO: Nonfocal.  Lab results: Results for orders placed during the hospital encounter of 11/23/11 (from the past 48 hour(s))  MAGNESIUM     Status: Normal   Collection Time   11/27/11  6:40 AM      Component Value Range Comment   Magnesium 2.1  1.5 - 2.5 (mg/dL)   PHOSPHORUS     Status: Normal   Collection Time   11/27/11  6:40 AM      Component Value Range Comment   Phosphorus 3.8  2.3 - 4.6 (mg/dL)   FERRITIN     Status: Abnormal   Collection Time   11/27/11  6:40 AM      Component Value Range Comment   Ferritin 298 (*) 10 - 291 (ng/mL)   COMPREHENSIVE METABOLIC PANEL     Status: Abnormal   Collection Time   11/27/11  6:40 AM      Component Value Range Comment   Sodium 131 (*) 135 - 145 (mEq/L)    Potassium 5.0  3.5 - 5.1 (mEq/L)    Chloride 98  96 - 112 (mEq/L)    CO2 23  19 - 32 (mEq/L)    Glucose, Bld 98  70 - 99 (mg/dL)    BUN 6  6 - 23 (mg/dL)    Creatinine, Ser 4.09  0.50 - 1.10 (mg/dL)    Calcium 9.5  8.4 - 10.5 (mg/dL)    Total Protein 8.2  6.0 - 8.3 (g/dL)    Albumin 3.5  3.5 - 5.2 (g/dL)    AST 78 (*) 0 - 37 (U/L)    ALT 19  0 - 35 (U/L)    Alkaline Phosphatase 65  39 - 117 (U/L)    Total Bilirubin 4.5 (*) 0.3 - 1.2 (mg/dL)  GFR calc non Af Amer >90  >90 (mL/min)    GFR calc Af Amer >90  >90 (mL/min)   CBC     Status: Abnormal   Collection Time   11/27/11  6:40 AM      Component Value Range Comment   WBC 7.5  4.0 - 10.5 (K/uL)     RBC 3.05 (*) 3.87 - 5.11 (MIL/uL)    Hemoglobin 9.1 (*) 12.0 - 15.0 (g/dL)    HCT 16.1 (*) 09.6 - 46.0 (%)    MCV 83.3  78.0 - 100.0 (fL)    MCH 29.8  26.0 - 34.0 (pg)    MCHC 35.8  30.0 - 36.0 (g/dL)    RDW 04.5 (*) 40.9 - 15.5 (%)    Platelets 198  150 - 400 (K/uL)   PRO B NATRIURETIC PEPTIDE     Status: Normal   Collection Time   11/27/11  6:40 AM      Component Value Range Comment   Pro B Natriuretic peptide (BNP) 47.5  0 - 125 (pg/mL)   CARDIAC PANEL(CRET KIN+CKTOT+MB+TROPI)     Status: Normal   Collection Time   11/27/11  9:56 AM      Component Value Range Comment   Total CK 162  7 - 177 (U/L)    CK, MB 2.3  0.3 - 4.0 (ng/mL)    Troponin I <0.30  <0.30 (ng/mL)    Relative Index 1.4  0.0 - 2.5    D-DIMER, QUANTITATIVE     Status: Abnormal   Collection Time   11/27/11  9:56 AM      Component Value Range Comment   D-Dimer, Quant 5.83 (*) 0.00 - 0.48 (ug/mL-FEU)   TSH     Status: Abnormal   Collection Time   11/27/11  9:56 AM      Component Value Range Comment   TSH 245.696 (*) 0.350 - 4.500 (uIU/mL) Result confirmed by automatic dilution.  CARDIAC PANEL(CRET KIN+CKTOT+MB+TROPI)     Status: Abnormal   Collection Time   11/27/11  5:03 PM      Component Value Range Comment   Total CK 182 (*) 7 - 177 (U/L)    CK, MB 3.0  0.3 - 4.0 (ng/mL)    Troponin I <0.30  <0.30 (ng/mL)    Relative Index 1.6  0.0 - 2.5    CARDIAC PANEL(CRET KIN+CKTOT+MB+TROPI)     Status: Normal   Collection Time   11/28/11 12:50 AM      Component Value Range Comment   Total CK 168  7 - 177 (U/L)    CK, MB 2.8  0.3 - 4.0 (ng/mL)    Troponin I <0.30  <0.30 (ng/mL)    Relative Index 1.7  0.0 - 2.5    COMPREHENSIVE METABOLIC PANEL     Status: Abnormal   Collection Time   11/28/11  5:08 AM      Component Value Range Comment   Sodium 130 (*) 135 - 145 (mEq/L)    Potassium 4.4  3.5 - 5.1 (mEq/L)    Chloride 97  96 - 112 (mEq/L)    CO2 24  19 - 32 (mEq/L)    Glucose, Bld 105 (*) 70 - 99 (mg/dL)    BUN 7  6 - 23  (mg/dL)    Creatinine, Ser 8.11  0.50 - 1.10 (mg/dL)    Calcium 9.7  8.4 - 10.5 (mg/dL)    Total Protein 8.4 (*) 6.0 - 8.3 (g/dL)  Albumin 3.6  3.5 - 5.2 (g/dL)    AST 87 (*) 0 - 37 (U/L)    ALT 22  0 - 35 (U/L)    Alkaline Phosphatase 68  39 - 117 (U/L)    Total Bilirubin 3.2 (*) 0.3 - 1.2 (mg/dL)    GFR calc non Af Amer 77 (*) >90 (mL/min)    GFR calc Af Amer 89 (*) >90 (mL/min)   CBC     Status: Abnormal   Collection Time   11/28/11  5:08 AM      Component Value Range Comment   WBC 7.8  4.0 - 10.5 (K/uL)    RBC 3.05 (*) 3.87 - 5.11 (MIL/uL)    Hemoglobin 9.2 (*) 12.0 - 15.0 (g/dL)    HCT 16.1 (*) 09.6 - 46.0 (%)    MCV 82.6  78.0 - 100.0 (fL)    MCH 30.2  26.0 - 34.0 (pg)    MCHC 36.5 (*) 30.0 - 36.0 (g/dL)    RDW 04.5 (*) 40.9 - 15.5 (%)    Platelets 206  150 - 400 (K/uL)   LIPID PANEL     Status: Abnormal   Collection Time   11/28/11  5:08 AM      Component Value Range Comment   Cholesterol 210 (*) 0 - 200 (mg/dL)    Triglycerides 811 (*) <150 (mg/dL)    HDL 33 (*) >91 (mg/dL)    Total CHOL/HDL Ratio 6.4      VLDL 31  0 - 40 (mg/dL)    LDL Cholesterol 478 (*) 0 - 99 (mg/dL)   T4, FREE     Status: Abnormal   Collection Time   11/28/11  5:08 AM      Component Value Range Comment   Free T4 0.16 (*) 0.80 - 1.80 (ng/dL)   CARDIAC PANEL(CRET KIN+CKTOT+MB+TROPI)     Status: Normal   Collection Time   11/28/11  9:06 AM      Component Value Range Comment   Total CK 162  7 - 177 (U/L)    CK, MB 2.5  0.3 - 4.0 (ng/mL)    Troponin I <0.30  <0.30 (ng/mL)    Relative Index 1.5  0.0 - 2.5      Studies/Results: Ct Angio Chest W/cm &/or Wo Cm  11/27/2011  *RADIOLOGY REPORT*  Clinical Data: Sickle cell anemia.  Elevated D-dimer.  CT ANGIOGRAPHY CHEST  Technique:  Multidetector CT imaging of the chest using the standard protocol during bolus administration of intravenous contrast. Multiplanar reconstructed images including MIPs were obtained and reviewed to evaluate the vascular  anatomy.  Contrast: OMNIPAQUE IOHEXOL 300 MG/ML  SOLN, OMNIPAQUE IOHEXOL 300 MG/ML  SOLN  Comparison: None.  Findings: There are no filling defects within the pulmonary arteries to suggest acute pulmonary embolism.  No acute findings of the aorta great vessels.  Small pericardial fluid present.  No mediastinal lymphadenopathy.  No hilar adenopathy.  Review lung parenchyma demonstrates mild bibasilar air space disease and atelectasis.  There is a focus of consolidation in the lingula with air bronchogram.  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 Limited view of the skeleton demonstrates the sclerotic changes the consistent with sickle cell disease.  IMPRESSION:  1.  No evidence acute pulmonary embolism. 2.  Small focus of consolidation within the lingula could represent pneumonia. 3.  Mild basilar air space disease at the lung bases representing edema. 4. Small pericardial effusion.  Original Report Authenticated By: Genevive Bi,  M.D.    Patient Active Problem List  Diagnoses  . Sickle cell anemia  . Sickle cell anemia with pain  . Ulcer - lesion  . Constipation  . Decreased appetite  . Chest pain  . Hypothyroidism    Impression: Sickle cell crisis. Sickle cell lung disease. Early pneumonia. Severe hypothyroidism questionable duration. 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  Transient chest pain. Negative enzymes. No further episodes presently. Pain and swelling of small joints. Rule out secondary to sickle cell anemia versus rheumatoid arthritis versus other. Rule out hyperuricemia as well. Hyperlipidemia. Mild hyponatremia.   Plan: Continue present therapy. Thyroid replacement in progress. Continue incentive spirometry. Physical therapy and occupational therapy in progress. Decrease IV fluids. Followup electrolytes today. Baseline uric acid level.     August Saucer, Magnum Lunde 11/28/2011 2:10 PM

## 2011-11-28 NOTE — Progress Notes (Signed)
Physical Therapy Treatment Patient Details Name: [name] MRN: [id number] DOB: 1961/03/01 Today's Date: 11/28/2011 Time: 1152-1205 PT Time Calculation (min): 13 min  PT Assessment / Plan / Recommendation Comments on Treatment Session  Trial of whirlpool as means of therapeutic modality (heat) while performing ROM exericises. Temp:94 degrees (temp gradually decreased too low as time progressed). Will likely have to make warmer and attempt to maintain warmer temp on next visit. Had pt peform ROM exercises with hands in whirlpool. Pt tolerated well but stated  there was no change in pain level after session. Also encouraged pt to perform ROM exercises as able.    Follow Up Recommendations  Home health PT    Equipment Recommendations   (to be determined)    Frequency Min 3X/week   Plan Discharge plan remains appropriate    Precautions / Restrictions Precautions Precautions: Fall Restrictions Weight Bearing Restrictions: No   Pertinent Vitals/Pain     Mobility      Exercises General Exercises - Upper Extremity Wrist Flexion: AROM;Both;Seated (AROM throughout time in whirlpool) Wrist Extension: AROM;Both;Seated (AROM throughout time in whirlpool) Digit Composite Flexion: AROM;Both;Seated (AROM throughout time in whirlpool) Composite Extension: AROM;Both;Seated (AROM throughout time in whirlpool)   PT Goals Acute Rehab PT Goals PT Goal Formulation: With patient Time For Goal Achievement: 12/12/11 Potential to Achieve Goals: Good Pt will go Sit to Stand: with modified independence PT Goal: Sit to Stand - Progress: Goal set today Pt will go Stand to Sit: with modified independence PT Goal: Stand to Sit - Progress: Goal set today Pt will Ambulate: >150 feet;with modified independence;with least restrictive assistive device PT Goal: Ambulate - Progress: Goal set today Pt will Go Up / Down Stairs: Flight;with modified independence;with rail(s) PT Goal: Up/Down Stairs - Progress:  Goal set today Pt will Perform Home Exercise Program: with supervision, verbal cues required/provided PT Goal: Perform Home Exercise Program - Progress: Progressing toward goal  Visit Information  Last PT Received On: 11/28/11 Assistance Needed: +1    Subjective Data  Subjective: "Pain is about the same" Patient Stated Goal: less pain   Cognition  Overall Cognitive Status: Appears within functional limits for tasks assessed/performed Arousal/Alertness: Awake/alert Orientation Level: Appears intact for tasks assessed Behavior During Session: [facility] for tasks performed Cognition - Other Comments: slow to respond intermittently    Balance  Balance Balance Assessed: Yes Static Sitting Balance Static Sitting - Balance Support: Bilateral upper extremity supported Static Sitting - Level of Assistance: 5: Stand by assistance Dynamic Sitting Balance Dynamic Sitting - Balance Support: Bilateral upper extremity supported Dynamic Sitting - Level of Assistance: 5: Stand by assistance  End of Session PT - End of Session Equipment Utilized During Treatment: Gait belt Activity Tolerance: Patient limited by fatigue Patient left: in chair;with call bell/phone within reach    Rebeca Alert [place] 11/28/2011, 1:22 PM [phone]

## 2011-11-29 DIAGNOSIS — E871 Hypo-osmolality and hyponatremia: Secondary | ICD-10-CM

## 2011-11-29 LAB — COMPREHENSIVE METABOLIC PANEL
ALT: 28 U/L (ref 0–35)
AST: 108 U/L — ABNORMAL HIGH (ref 0–37)
Albumin: 3.8 g/dL (ref 3.5–5.2)
Alkaline Phosphatase: 75 U/L (ref 39–117)
BUN: 10 mg/dL (ref 6–23)
CO2: 24 mEq/L (ref 19–32)
Calcium: 9.8 mg/dL (ref 8.4–10.5)
Chloride: 95 mEq/L — ABNORMAL LOW (ref 96–112)
Creatinine, Ser: 0.87 mg/dL (ref 0.50–1.10)
GFR calc Af Amer: 88 mL/min — ABNORMAL LOW (ref >90)
GFR calc non Af Amer: 76 mL/min — ABNORMAL LOW (ref >90)
Glucose, Bld: 158 mg/dL — ABNORMAL HIGH (ref 70–99)
Potassium: 4.4 mEq/L (ref 3.5–5.1)
Sodium: 130 mEq/L — ABNORMAL LOW (ref 135–145)
Total Bilirubin: 3.1 mg/dL — ABNORMAL HIGH (ref 0.3–1.2)
Total Protein: 8.8 g/dL — ABNORMAL HIGH (ref 6.0–8.3)

## 2011-11-29 LAB — CBC
HCT: 24.8 % — ABNORMAL LOW (ref 36.0–46.0)
Hemoglobin: 9.1 g/dL — ABNORMAL LOW (ref 12.0–15.0)
MCH: 29.9 pg (ref 26.0–34.0)
MCHC: 36.7 g/dL — ABNORMAL HIGH (ref 30.0–36.0)
MCV: 81.6 fL (ref 78.0–100.0)
Platelets: 226 10*3/uL (ref 150–400)
RBC: 3.04 MIL/uL — ABNORMAL LOW (ref 3.87–5.11)
RDW: 20 % — ABNORMAL HIGH (ref 11.5–15.5)
WBC: 8.7 10*3/uL (ref 4.0–10.5)

## 2011-11-29 LAB — HEMOGLOBINOPATHY EVALUATION
Hemoglobin Other: 0 %
Hgb A2 Quant: 2.9 % (ref 2.2–3.2)
Hgb A: 41 % — ABNORMAL LOW (ref 96.8–97.8)
Hgb F Quant: 0.7 % — ABNORMAL HIGH (ref 0.0–2.0)
Hgb S Quant: 55.4 % — ABNORMAL HIGH

## 2011-11-29 MED ORDER — ONDANSETRON HCL 4 MG/2ML IJ SOLN
4.0000 mg | INTRAMUSCULAR | Status: AC
Start: 2011-11-29 — End: 2011-11-30
  Administered 2011-11-29 – 2011-11-30 (×5): 4 mg via INTRAVENOUS
  Filled 2011-11-29 (×5): qty 2

## 2011-11-29 MED ORDER — SENNOSIDES-DOCUSATE SODIUM 8.6-50 MG PO TABS
2.0000 | ORAL_TABLET | Freq: Every day | ORAL | Status: DC
  Administered 2011-11-29: 2 via ORAL
  Filled 2011-11-29 (×2): qty 2

## 2011-11-29 MED ORDER — DEXTROSE-NACL 5-0.45 % IV SOLN
INTRAVENOUS | Status: DC
  Administered 2011-11-30: 10:00:00 via INTRAVENOUS

## 2011-11-29 MED ORDER — DEXTROSE-NACL 5-0.9 % IV SOLN
INTRAVENOUS | Status: AC
  Administered 2011-11-29 – 2011-11-30 (×2): via INTRAVENOUS

## 2011-11-29 MED ORDER — ONDANSETRON HCL 4 MG/2ML IJ SOLN: 4.0000 mg | INTRAMUSCULAR | Status: DC | PRN

## 2011-11-29 MED ORDER — HYDROMORPHONE HCL PF 1 MG/ML IJ SOLN: 0.5000 mg | INTRAMUSCULAR | Status: DC | PRN

## 2011-11-29 NOTE — Progress Notes (Signed)
Subjective: The patient was seen on rounds today.  The patient was laying quietly in her bed, eating a snack.  The patient stated that she is currently pain free but pain has been as high as  6/10 in her bilateral hand and wrist areas. The patient is experiencing severe nausea today after receiving IV Dilaudid. Let the patient know that I will schedule Zofran for the next 24 hours.  Spoke to the patient's nurse about the patient's pain medications and low tolerance for narcotics.  The patient has been seen by PT, OT and Wound Care Nurse.  The patient has not had a bowel movement.  Encouraged the patient to increase her nutritional intake and discussed different cafeteria options.  Discussed the patient's hypothyroidism with her again today.  Also discussed the patient's fasting lipid profile with her.  No other nursing or patient concerns.  Objective: Vital signs in last 24 hours: Blood pressure 112/70, pulse 61, temperature 97.4 F (36.3 C), temperature source Oral, resp. rate 18, height 5\' 11"  (1.803 m), weight 148 lb 5.9 oz (67.3 kg), SpO2 88.00%.  General Appearance: Alert, cooperative, appears stated age, well nourished, well developed, mild distress Head: Normocephalic, without obvious abnormality, atraumatic Eyes: PERRLA, EOMI, scleral icterus Nose: Nares, septum and mucosa are normal, no drainage or sinus tenderness Throat: Lips, mucosa, gums and tongue are normal, patient has upper dentures, lower dentition is in fair condition Neck: No adenopathy, supple, symmetrical, trachea midline and thyroid not enlarged, symmetric, no tenderness Back: Symmetric, no curvature, normal ROM, no CVA tenderness Resp: Diminished bibasilar breath sounds, CTA, no wheezes/rales/rhonchi Cardio: Regular rate and rhythm, S1, S2 normal, no murmur/click/rub/gallop GI: Soft, non-tender; bowel sounds are hypoactive, no masses,  no organomegaly Extremities: Painful and tender UE, atraumatic, trace UE edema, Homans  sign is negative, no sign of DVT, bilateral LE ulcers, 4/5 strength UEs Pulses: 2+ and symmetric Skin: Bilateral LE ulcers in various stages of healing and bilateral UE lesions/ulcers in various stages of healing Neurologic: Grossly normal, CN II - XII intact, no focal deficits Psych:  Flat affect  Lab Results: Results for orders placed during the hospital encounter of 11/23/11 (from the past 24 hour(s))  CARDIAC PANEL(CRET KIN+CKTOT+MB+TROPI)     Status: Normal   Collection Time   11/28/11  5:15 PM      Component Value Range   Total CK 163  7 - 177 (U/L)   CK, MB 2.5  0.3 - 4.0 (ng/mL)   Troponin I <0.30  <0.30 (ng/mL)   Relative Index 1.5  0.0 - 2.5   COMPREHENSIVE METABOLIC PANEL     Status: Abnormal   Collection Time   11/29/11  3:45 AM      Component Value Range   Sodium 130 (*) 135 - 145 (mEq/L)   Potassium 4.4  3.5 - 5.1 (mEq/L)   Chloride 95 (*) 96 - 112 (mEq/L)   CO2 24  19 - 32 (mEq/L)   Glucose, Bld 158 (*) 70 - 99 (mg/dL)   BUN 10  6 - 23 (mg/dL)   Creatinine, Ser 1.61  0.50 - 1.10 (mg/dL)   Calcium 9.8  8.4 - 09.6 (mg/dL)   Total Protein 8.8 (*) 6.0 - 8.3 (g/dL)   Albumin 3.8  3.5 - 5.2 (g/dL)   AST 045 (*) 0 - 37 (U/L)   ALT 28  0 - 35 (U/L)   Alkaline Phosphatase 75  39 - 117 (U/L)   Total Bilirubin 3.1 (*) 0.3 - 1.2 (mg/dL)  GFR calc non Af Amer 76 (*) >90 (mL/min)   GFR calc Af Amer 88 (*) >90 (mL/min)  CBC     Status: Abnormal   Collection Time   11/29/11  3:45 AM      Component Value Range   WBC 8.7  4.0 - 10.5 (K/uL)   RBC 3.04 (*) 3.87 - 5.11 (MIL/uL)   Hemoglobin 9.1 (*) 12.0 - 15.0 (g/dL)   HCT 16.1 (*) 09.6 - 46.0 (%)   MCV 81.6  78.0 - 100.0 (fL)   MCH 29.9  26.0 - 34.0 (pg)   MCHC 36.7 (*) 30.0 - 36.0 (g/dL)   RDW 04.5 (*) 40.9 - 15.5 (%)   Platelets 226  150 - 400 (K/uL)    Studies/Results: Ct Angio Chest W/cm &/or Wo Cm  11/27/2011  *RADIOLOGY REPORT*  Clinical Data: Sickle cell anemia.  Elevated D-dimer.  CT ANGIOGRAPHY CHEST  Technique:   Multidetector CT imaging of the chest using the standard protocol during bolus administration of intravenous contrast. Multiplanar reconstructed images including MIPs were obtained and reviewed to evaluate the vascular anatomy.  Contrast: OMNIPAQUE IOHEXOL 300 MG/ML  SOLN, OMNIPAQUE IOHEXOL 300 MG/ML  SOLN  Comparison: None.  Findings: There are no filling defects within the pulmonary arteries to suggest acute pulmonary embolism.  No acute findings of the aorta great vessels.  Small pericardial fluid present.  No mediastinal lymphadenopathy.  No hilar adenopathy.  Review lung parenchyma demonstrates mild bibasilar air space disease and atelectasis.  There is a focus of consolidation in the lingula with air bronchogram.  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 Limited view of the skeleton demonstrates the sclerotic changes the consistent with sickle cell disease.  IMPRESSION:  1.  No evidence acute pulmonary embolism. 2.  Small focus of consolidation within the lingula could represent pneumonia. 3.  Mild basilar air space disease at the lung bases representing edema. 4. Small pericardial effusion.  Original Report Authenticated By: Genevive Bi, M.D.     Medications:   Allergies  Allergen Reactions  . Ampicillin   . Demerol (Meperidine)      Current Facility-Administered Medications  Medication Dose Route Frequency Provider Last Rate Last Dose  . acetaminophen (TYLENOL) tablet 650 mg  650 mg Oral Q4H PRN Keitha Butte, NP      . ALPRAZolam Prudy Feeler) tablet 0.25-0.5 mg  0.25-0.5 mg Oral Q3H PRN Gwenyth Bender, MD      . alum & mag hydroxide-simeth (MAALOX/MYLANTA) 200-200-20 MG/5ML suspension 15-30 mL  15-30 mL Oral Q4H PRN Gwenyth Bender, MD      . dextrose 5 %-0.45 % sodium chloride infusion   Intravenous Continuous Keitha Butte, NP      . dextrose 5 %-0.9 % sodium chloride infusion   Intravenous Continuous Keitha Butte, NP      . diclofenac  sodium (VOLTAREN) 1 % transdermal gel   Topical QID Keitha Butte, NP      . diphenhydrAMINE (BENADRYL) capsule 25-50 mg  25-50 mg Oral Q4H PRN Gwenyth Bender, MD       Or  . diphenhydrAMINE (BENADRYL) injection 12.5-25 mg  12.5-25 mg Intravenous Q4H PRN Gwenyth Bender, MD   25 mg at 11/23/11 1455  . enoxaparin (LOVENOX) injection 40 mg  40 mg Subcutaneous Q24H Gwenyth Bender, MD   40 mg at 11/28/11 2015  . feeding supplement (ENSURE COMPLETE) liquid 237 mL  237 mL Oral TID BM Adela Lank A  Justinian Miano, NP   237 mL at 11/28/11 2017  . folic acid (FOLVITE) tablet 1 mg  1 mg Oral Daily Gwenyth Bender, MD   1 mg at 11/29/11 1046  . HYDROmorphone (DILAUDID) injection 0.5 mg  0.5 mg Intravenous Q3H PRN Keitha Butte, NP      . ibuprofen (ADVIL,MOTRIN) tablet 400 mg  400 mg Oral QID Keitha Butte, NP   400 mg at 11/29/11 1045  . levalbuterol (XOPENEX) nebulizer solution 0.63 mg  0.63 mg Nebulization Q12H Keitha Butte, NP   0.63 mg at 11/29/11 0742  . levofloxacin (LEVAQUIN) IVPB 750 mg  750 mg Intravenous Q24H Annia Belt, PHARMD   750 mg at 11/28/11 1729  . levothyroxine (SYNTHROID, LEVOTHROID) tablet 50 mcg  50 mcg Oral QAC breakfast Keitha Butte, NP   50 mcg at 11/29/11 1046  . lidocaine (LIDODERM) 5 % 1 patch  1 patch Transdermal Daily Gwenyth Bender, MD   1 patch at 11/28/11 2146  . mupirocin ointment (BACTROBAN) 2 %   Topical BID Keitha Butte, NP      . Muscle Rub CREA   Topical TID Keitha Butte, NP      . naloxone [facility]) injection 0.4 mg  0.4 mg Intravenous PRN Gwenyth Bender, MD   0.4 mg at 11/23/11 1716  . ondansetron (ZOFRAN) injection 4 mg  4 mg Intravenous Q4H Keitha Butte, NP      . ondansetron (ZOFRAN) injection 4 mg  4 mg Intravenous Q4H PRN Keitha Butte, NP      . oxyCODONE-acetaminophen (PERCOCET) 5-325 MG per tablet 1 tablet  1 tablet Oral Q4H PRN Ricki Rodriguez, MD   1 tablet at 11/27/11 1325  . pantoprazole (PROTONIX) EC tablet 40 mg  40  mg Oral Q1200 Keitha Butte, NP   40 mg at 11/29/11 1046  . polyethylene glycol (MIRALAX / GLYCOLAX) packet 17 g  17 g Oral Daily Keitha Butte, NP   17 g at 11/29/11 1045  . senna-docusate (Senokot-S) tablet 2 tablet  2 tablet Oral QHS Keitha Butte, NP      . vitamin C (ASCORBIC ACID) tablet 500 mg  500 mg Oral Daily Keitha Butte, NP   500 mg at 11/29/11 1046  . zolpidem (AMBIEN) tablet 5 mg  5 mg Oral QHS PRN,MR X 1 Gwenyth Bender, MD      . DISCONTD: aspirin EC tablet 81 mg  81 mg Oral Daily Keitha Butte, NP   81 mg at 11/29/11 1046  . DISCONTD: dextrose 5 % and 0.45 % NaCl with KCl 20 mEq/L infusion   Intravenous Continuous Keitha Butte, NP 75 mL/hr at 11/27/11 2331    . DISCONTD: docusate sodium (COLACE) capsule 100 mg  100 mg Oral BID Gwenyth Bender, MD   100 mg at 11/29/11 1046  . DISCONTD: HYDROmorphone (DILAUDID) injection 1 mg  1 mg Intravenous Q3H PRN Gwenyth Bender, MD   1 mg at 11/29/11 0217  . DISCONTD: ondansetron (ZOFRAN) injection 4 mg  4 mg Intravenous Q4H PRN Gwenyth Bender, MD   4 mg at 11/29/11 0943  . DISCONTD: ondansetron (ZOFRAN) tablet 4 mg  4 mg Oral Q4H PRN Gwenyth Bender, MD        Assessment/Plan: Patient Active Problem List  Diagnoses  . Sickle cell anemia  . Sickle cell anemia with pain  . Ulcer - lesion  . Constipation  .  Decreased appetite  . Chest pain  . Hypothyroidism  . Hyponatremia  . Arthritis   Sickle cell anemia with pain:  The patient will continue with mild pain management as she has delirium with strong narcotics. The patient is also receiving topical analgesics. The patient will continue receiving pain/nausea/pruritis management/bowel.  Hemoglobinopathy is pending.  CMP and CBC in the am.  Ulcers:  The patient will be started on Bactroban TID.  Wound Care Nurse has assessed the patient. Chest pain:  Resolved - the patient's Serial cardiac enzymes were negative.  The patient will continue to be telemetry monitored.    Sickle Lung Disease/Early Pneumonia:  Pharmacy consulted for IV Levaquin dosing.  The patient has scheduled nebulizer treatments and increased IS usage encouraged. Hypothyroidism:  The patient started on Levothyroxine - will continue to monitor Arthritis/Possible Gout:  The patient is working with PT and OT.   CRP, ESR, Rheumatoid factor, Sed Rate, CCP, Anti-CCP, and Uric Acid in the am.   Discussed and agreed upon plan of care with the patient.   The plan of care will be adjusted based on the patient's clinical progress.  Larina Bras, NP-C 11/29/2011, 10:48 AM

## 2011-11-29 NOTE — Progress Notes (Signed)
Occupational Therapy Treatment Patient Details Name: [name] MRN: [id number] DOB: 09/12/1960 Today's Date: 11/29/2011 Time: 0950-1000 OT Time Calculation (min): 10 min  OT Assessment / Plan / Recommendation Comments on Treatment Session Pt nauseous this visit so proceeded with joint protection and energy conservation ed as well as AE training. Issued AE for pt to try and request she let us know how it works for her.     Follow Up Recommendations  Home health OT    Barriers to Discharge       Equipment Recommendations  Other (comment) (to be determined.)    Recommendations for Other Services    Frequency Min 2X/week   Plan Discharge plan remains appropriate    Precautions / Restrictions Precautions Precautions: Fall Restrictions Weight Bearing Restrictions: No        ADL  ADL Comments: Nsg reports pt nauseous this am. Pt eating a saltine cracker when OT walked in and stating she is nauseous but pain is currently a 0. had pain meds earlier. Pt agreeable to OT continuing with joint protection/AE education and try OOB activity when pt feeling better. Issued  hand out on energy conservation techniques and asked pt to try to look over handout when she feels up to it and can ask questions as needed. Reviewed some of techniques with her as overview. Demonstrated some AE that will hopefully help with pain in wrists/hands. including wash mitt and red foam for her utensils. Pt states she thinks these items will help. Will assess effectiveness as able with future sessions. Pt encouraged to use them in meantime. Also reviewed some joint protection principles with pt including proper position for gripping items such a  pot or spoon to stir things. Discussed importance of trying to keep hands in the most natural position as possible with activity and avoid positions of deformity. Will definitely benefit from [facility] to address IADLs further once home.     OT Goals Miscellaneous OT Goals OT Goal:  Miscellaneous Goal #1 - Progress: Progressing toward goals OT Goal: Miscellaneous Goal #2 - Progress: Progressing toward goals OT Goal: Miscellaneous Goal #3 - Progress: Progressing toward goals  Visit Information  Last OT Received On: 11/29/11 Assistance Needed: +1    Subjective Data  Subjective: I am nauseous Patient Stated Goal: none stated. agreeable to OT education this date   Prior Functioning       Cognition  Overall Cognitive Status: Appears within functional limits for tasks assessed/performed Arousal/Alertness: Awake/alert Orientation Level: Appears intact for tasks assessed Behavior During Session: [facility] for tasks performed    Mobility     Exercises    Balance    End of Session OT - End of Session Equipment Utilized During Treatment: Other (comment) (AE for washind and feeding) Activity Tolerance: Other (comment) (verbal education this visit due to pt with nausea) Patient left: in bed;with call bell/phone within reach   Lennox Laity [phone] 11/29/2011, 11:24 AM

## 2011-11-30 ENCOUNTER — Encounter (HOSPITAL_COMMUNITY): Payer: Self-pay | Admitting: Family

## 2011-11-30 LAB — CBC
HCT: 22.3 % — ABNORMAL LOW (ref 36.0–46.0)
Hemoglobin: 7.9 g/dL — ABNORMAL LOW (ref 12.0–15.0)
MCH: 28.9 pg (ref 26.0–34.0)
MCHC: 35.4 g/dL (ref 30.0–36.0)
MCV: 81.7 fL (ref 78.0–100.0)
Platelets: 195 10*3/uL (ref 150–400)
RBC: 2.73 MIL/uL — ABNORMAL LOW (ref 3.87–5.11)
RDW: 20.1 % — ABNORMAL HIGH (ref 11.5–15.5)
WBC: 6.9 10*3/uL (ref 4.0–10.5)

## 2011-11-30 LAB — SEDIMENTATION RATE: Sed Rate: 99 mm/hr — ABNORMAL HIGH (ref 0–22)

## 2011-11-30 LAB — COMPREHENSIVE METABOLIC PANEL
ALT: 26 U/L (ref 0–35)
AST: 109 U/L — ABNORMAL HIGH (ref 0–37)
Albumin: 3.5 g/dL (ref 3.5–5.2)
Alkaline Phosphatase: 57 U/L (ref 39–117)
BUN: 8 mg/dL (ref 6–23)
CO2: 25 mEq/L (ref 19–32)
Calcium: 8.9 mg/dL (ref 8.4–10.5)
Chloride: 96 mEq/L (ref 96–112)
Creatinine, Ser: 0.88 mg/dL (ref 0.50–1.10)
GFR calc Af Amer: 87 mL/min — ABNORMAL LOW (ref >90)
GFR calc non Af Amer: 75 mL/min — ABNORMAL LOW (ref >90)
Glucose, Bld: 95 mg/dL (ref 70–99)
Potassium: 4.2 mEq/L (ref 3.5–5.1)
Sodium: 131 mEq/L — ABNORMAL LOW (ref 135–145)
Total Bilirubin: 3.1 mg/dL — ABNORMAL HIGH (ref 0.3–1.2)
Total Protein: 8.3 g/dL (ref 6.0–8.3)

## 2011-11-30 LAB — URIC ACID: Uric Acid, Serum: 2.9 mg/dL (ref 2.4–7.0)

## 2011-11-30 LAB — C-REACTIVE PROTEIN: CRP: 0.63 mg/dL — ABNORMAL HIGH (ref <0.60)

## 2011-11-30 LAB — RHEUMATOID FACTOR: Rhuematoid fact SerPl-aCnc: 118 IU/mL — ABNORMAL HIGH (ref <=14)

## 2011-11-30 MED ORDER — LEVOFLOXACIN 750 MG PO TABS: 750.0000 mg | ORAL_TABLET | Freq: Every day | ORAL | Status: AC

## 2011-11-30 MED ORDER — OXYCODONE-ACETAMINOPHEN 5-325 MG PO TABS: 1.0000 | ORAL_TABLET | ORAL | Status: AC | PRN

## 2011-11-30 MED ORDER — FOLIC ACID 800 MCG PO TABS: 400.0000 ug | ORAL_TABLET | Freq: Every day | ORAL | Status: DC

## 2011-11-30 MED ORDER — IBUPROFEN 800 MG PO TABS: 800.0000 mg | ORAL_TABLET | Freq: Three times a day (TID) | ORAL | Status: AC | PRN

## 2011-11-30 MED ORDER — LEVOTHYROXINE SODIUM 50 MCG PO TABS: 50.0000 ug | ORAL_TABLET | Freq: Every day | ORAL | Status: DC

## 2011-11-30 NOTE — Discharge Summary (Signed)
[facility] Discharge Summary  Patient ID: [name] MRN: [id number] DOB/AGE: 11-12-1960 51 y.o.  Admit date: 11/23/2011 Discharge date: 11/30/2011  Admission Diagnoses: Sickle cell crisis.  Swelling of PIP joints History of cerebral aneurysm repair Hypokalemia  Discharge Diagnoses:  Sickle cell anemia with pain Ulcer - lesion Constipation Decreased appetite Chest pain Hypothyroidism Hyponatremia Pneumonia Arthritis  Discharged Condition: Stable  Hospital Course:    Please refer to detailed H&P for information regarding this patient's admission.  In short, [name] is a pleasant 51 year-old [place] American female with a PMH hemoglobin SS disease, arthritis, leg ulcers and cerebral aneurysm repair who presented to the [facility] via self-referral for treatment and evaluation. The patient noted that she has frequent sickle cell crisis approximately one to 2 every month. She presented complaining of pain in hands with associated swelling of fingers. She notes she's had some chronic swelling since being diagnosed with presumably rheumatoid arthritis.  Upon admission to the hospital, the patient had received IV hydration, antibiotic therapy, scheduled nebulizer treatments, a blood transfusion and had extensive laboratory and diagnostic testing completed.  The patient complained of chest pain while hospitalized and subsequent testing revealed an elevated D-dimer.  The patient's cardiac enzymes, EKG and CTA of the chest were negative.  The patient was also found to have hypothyroidism and was subsequently started on Levothyroxine.  The patient's x-ray revealed possible OA on her left hand.  Uric acid level was normal.  Sed rate, RF and ESR are all elevated.  The patient declined home PT and OT.  The patient states that she is ready to manage her pain at home and was given Rx for daily NSAID therapy with instruction.  The patient will be discharged to home in stable condition at  this time.   Consults:  Physical Therapy Occupational Therapy Wound Care Nurse  Significant Diagnostic Studies:  Dg Wrist Complete Left Dec 08, 2011  *RADIOLOGY REPORT*  Clinical Data: Pain.  History of sickle cell disease.  Swelling. No injury.  LEFT WRIST - COMPLETE 3+ VIEW  Comparison: Right examination of same date.  Findings: Alignment is normal.  Joint spaces are preserved.  No fracture or dislocation is evident.  No soft tissue lesions are seen.  IMPRESSION: No fracture or bony destruction is seen.  Original Report Authenticated By: Crawford Givens, M.D.   Dg Wrist Complete Right 12/08/2011  *RADIOLOGY REPORT*  Clinical Data: Pain and swelling.  History of sickle cell disease. No history of injury.  RIGHT WRIST - COMPLETE 3+ VIEW  Comparison: Left examination of same date  Findings: Alignment is normal.  Joint spaces are preserved.  No fracture or dislocation is evident.  No soft tissue lesions are seen.  IMPRESSION: No fracture or bony destruction.  Original Report Authenticated By: Crawford Givens, M.D.   Ct Angio Chest W/cm &/or Wo Cm 11/27/2011  *RADIOLOGY REPORT*  Clinical Data: Sickle cell anemia.  Elevated D-dimer.  CT ANGIOGRAPHY CHEST  Technique:  Multidetector CT imaging of the chest using the standard protocol during bolus administration of intravenous contrast. Multiplanar reconstructed images including MIPs were obtained and reviewed to evaluate the vascular anatomy.  Contrast: OMNIPAQUE IOHEXOL 300 MG/ML  SOLN, OMNIPAQUE IOHEXOL 300 MG/ML  SOLN  Comparison: None.  Findings: There are no filling defects within the pulmonary arteries to suggest acute pulmonary embolism.  No acute findings of the aorta great vessels.  Small pericardial fluid present.  No mediastinal lymphadenopathy.  No hilar adenopathy.  Review lung parenchyma  demonstrates mild bibasilar air space disease and atelectasis.  There is a focus of consolidation in the lingula with air bronchogram.  Limited view of the upper  abdomen demonstrates infarcted spleen. Limited view of the skeleton demonstrates the sclerotic changes the consistent with sickle cell disease.  IMPRESSION:  1.  No evidence acute pulmonary embolism. 2.  Small focus of consolidation within the lingula could represent pneumonia. 3.  Mild basilar air space disease at the lung bases representing edema. 4. Small pericardial effusion.  Original Report Authenticated By: Genevive Bi, M.D.   Dg Hand Complete Left 11/26/2011  *RADIOLOGY REPORT*  Clinical Data: History of pain and swelling.  History of sickle cell disease.  No known injury.  LEFT HAND - COMPLETE 3+ VIEW  Comparison: Right examination of same date.  Findings: There is erosive articular change involving the PIP joint area of the third finger with surrounding soft tissue swelling. No other significant joint lesions are seen.  No fracture dislocation is seen.  IMPRESSION: Soft tissue swelling of the third finger centered on the PIP joint. Erosive changes involve the articular surfaces of these PIP joint of the third finger.  There is some osteophyte formation.  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  Infection cannot be excluded.  Gout cannot be excluded.  Original Report Authenticated By: Crawford Givens, M.D.   Dg Hand Complete Right 11/26/2011  *RADIOLOGY REPORT*  Clinical Data: Pain and swelling.  History of sickle cell disease.  RIGHT HAND - COMPLETE 3+ VIEW  Comparison: Left hand examination.  Findings: Alignment is normal.  Joint spaces are preserved.  No fracture or dislocation is evident.  No soft tissue lesions are seen.  IMPRESSION: No abnormality of right hand identified.  Original Report Authenticated By: Crawford Givens, M.D.    Treatments: IV hydration Antibiotics: Levaquin an Bactroban (for leg ulcers) Analgesia: Percocet Anticoagulation: Lovenox Respiratory therapy O2 and Nebulizer treatments Therapies: PT and OT  Blood transfusion  Discharge Exam: Blood pressure 104/62, pulse  77, temperature 99 F (37.2 C), temperature source Oral, resp. rate 18, height 5\' 11"  (1.803 m), weight 147 lb 11.3 oz (67 kg), SpO2 92.00%.  General Appearance: Alert, cooperative, appears stated age, well nourished, well developed, mild distress  Head: Normocephalic, without obvious abnormality, atraumatic  Eyes: PERRLA, EOMI, scleral icterus  Nose: Nares, septum and mucosa are normal, no drainage or sinus tenderness  Throat: Lips, mucosa, gums and tongue are normal, patient has upper dentures, lower dentition is in fair condition  Neck: No adenopathy, supple, symmetrical, trachea midline and thyroid not enlarged, symmetric, no tenderness  Back: Symmetric, no curvature, normal ROM, no CVA tenderness  Resp: Diminished bibasilar breath sounds, CTA, no wheezes/rales/rhonchi  Cardio: Regular rate and rhythm, S1, S2 normal, no murmur/click/rub/gallop  GI: Soft, non-tender; bowel sounds are hypoactive, no masses, no organomegaly  Extremities: Painful and tender UE, atraumatic, trace UE edema, Homans sign is negative, no sign of DVT, bilateral LE ulcers, 4/5 strength UEs  Pulses: 2+ and symmetric  Skin: Bilateral LE ulcers in various stages of healing and bilateral UE lesions/ulcers in various stages of healing  Neurologic: Grossly normal, CN II - XII intact, no focal deficits  Psych: Flat affect   Disposition: 01-Home or Self Care  Discharge Orders    Future Orders Please Complete By Expires   Increase activity slowly      Discharge instructions      Comments:   Take all medications as prescribed Finish entire course of antibiotic therapy Keep all follow-up  appointments Drink plenty of fluids Get plenty of rest Keep warm Avoid stress   Call MD for:  severe uncontrolled pain        Medication List  As of 11/30/2011  7:39 PM   STOP taking these medications         hydrocodone-acetaminophen 5-500 MG per capsule         TAKE these medications         CALTRATE 600 PLUS-VIT D PO    Take 1 tablet by mouth daily.      folic acid 800 MCG tablet   Commonly known as: FOLVITE   Take 0.5 tablets (400 mcg total) by mouth daily.      ibuprofen 800 MG tablet   Commonly known as: ADVIL,MOTRIN   Take 1 tablet (800 mg total) by mouth every 8 (eight) hours as needed for pain.      levofloxacin 750 MG tablet   Commonly known as: LEVAQUIN   Take 1 tablet (750 mg total) by mouth daily.      levothyroxine 50 MCG tablet   Commonly known as: SYNTHROID, LEVOTHROID   Take 1 tablet (50 mcg total) by mouth daily before breakfast.      oxyCODONE-acetaminophen 5-325 MG per tablet   Commonly known as: PERCOCET   Take 1 tablet by mouth every 4 (four) hours as needed for pain.      promethazine 25 MG tablet   Commonly known as: PHENERGAN   Take 25 mg by mouth every 4 (four) hours as needed. For nausea      promethazine 25 MG tablet   Commonly known as: PHENERGAN   Take 1 tablet (25 mg total) by mouth every 6 (six) hours as needed for nausea.      VITAMIN C PO   Take 500 mg elemental calcium/kg/hr by mouth daily.           Follow-up Information    Follow up with August Saucer, ERIC, MD in 1 week. (Go to the ER or the [facility] if symptoms worsen)    Contact information:   [address] [facility] [place] [address] [phone]         Time spent on discharge: Greater than 30 minutes  Signed: Larina Bras NP-C 11/30/2011, 4:30 PM

## 2011-11-30 NOTE — Progress Notes (Signed)
ANTIBIOTIC CONSULT NOTE - Follow up  Pharmacy Consult for Levaquin Indication: Possible PNA  Allergies  Allergen Reactions  . Ampicillin   . Demerol (Meperidine)     Patient Measurements: Height: 5\' 11"  (180.3 cm) Weight: 147 lb 11.3 oz (67 kg) IBW/kg (Calculated) : 70.8   Vital Signs: Temp: 97.9 F (36.6 C) (05/10 0529) Temp src: Oral (05/10 0529) BP: 111/66 mmHg (05/10 0529) Pulse Rate: 70  (05/10 0529) Intake/Output from previous day: 05/09 0701 - 05/10 0700 In: 2777.5 [I.V.:2777.5] Out: 2800 [Urine:2800]  Labs:  Basename 11/30/11 0705 11/29/11 0345 11/28/11 0508  WBC 6.9 8.7 7.8  HGB 7.9* 9.1* 9.2*  PLT 195 226 206  LABCREA -- -- --  CREATININE 0.88 0.87 0.86   Estimated Creatinine Clearance: 80 ml/min (by C-G formula based on Cr of 0.88).  Microbiology: No results found for this or any previous visit (from the past 720 hour(s)).   Medications:  Anti-infectives     Start     Dose/Rate Route Frequency Ordered Stop   11/27/11 1600   levofloxacin (LEVAQUIN) IVPB 750 mg        750 mg 100 mL/hr over 90 Minutes Intravenous Every 24 hours 11/27/11 1427           Assessment:  51 yo F with sickle cell anemia on day #4 empiric Levaquin for possible PNA seen on chest CT.  Renal function is stable  Plan:   Continue Levaquin 750mg  IV q24h  Change to PO when patient can tolerate oral diet and medications - defer to MD.  Lynann Beaver PharmD, BCPS Pager [phone] 11/30/2011 1:43 PM

## 2011-11-30 NOTE — Progress Notes (Signed)
Physical Therapy Treatment Patient Details Name: Alfreida Steffenhagen MRN: [id number] DOB: 1961-01-14 Today's Date: 11/30/2011 Time: 3244-0102 PT Time Calculation (min): 17 min  PT Assessment / Plan / Recommendation Comments on Treatment Session  Pt progressing with gait distance, however has slow gait speed with some unsteadiness/imbalance noted.  Continues to have pain in B wrist, stating that her pain is the same.      Follow Up Recommendations  Home health PT    Barriers to Discharge        Equipment Recommendations       Recommendations for Other Services    Frequency Min 3X/week   Plan Discharge plan remains appropriate    Precautions / Restrictions Precautions Precautions: Fall Restrictions Weight Bearing Restrictions: No   Pertinent Vitals/Pain 7/10    Mobility  Bed Mobility Bed Mobility: Supine to Sit Supine to Sit: 6: Modified independent (Device/Increase time) Sitting - Scoot to Edge of Bed: 6: Modified independent (Device/Increase time) Transfers Transfers: Sit to Stand;Stand to Sit Sit to Stand: 5: Supervision Stand to Sit: 5: Supervision Details for Transfer Assistance: VC's for safety and hand placement.  Ambulation/Gait Ambulation/Gait Assistance: 4: Min assist Ambulation Distance (Feet): 200 Feet Assistive device: Other (Comment) (used IV pole to stabalize) Ambulation/Gait Assistance Details: Assist for steadying with cues for upright posture and safety.  Gait Pattern: Step-through pattern;Decreased stride length;Decreased step length - right;Decreased step length - left Gait velocity: decreased    Exercises     PT Diagnosis:    PT Problem List:   PT Treatment Interventions:     PT Goals Acute Rehab PT Goals PT Goal Formulation: With patient Time For Goal Achievement: 12/12/11 Potential to Achieve Goals: Good Pt will go Sit to Stand: with modified independence PT Goal: Sit to Stand - Progress: Progressing toward goal Pt will go Stand to Sit: with  modified independence PT Goal: Stand to Sit - Progress: Progressing toward goal Pt will Ambulate: >150 feet;with modified independence;with least restrictive assistive device PT Goal: Ambulate - Progress: Progressing toward goal  Visit Information  Last PT Received On: 11/30/11 Assistance Needed: +1    Subjective Data  Subjective: My wrists are still sore, but the left hurts more Patient Stated Goal: less pain   Cognition  Overall Cognitive Status: Appears within functional limits for tasks assessed/performed Arousal/Alertness: Awake/alert Orientation Level: Appears intact for tasks assessed Behavior During Session: [facility] for tasks performed    Balance     End of Session PT - End of Session Activity Tolerance: Patient limited by fatigue;Patient limited by pain Patient left: in chair;with call bell/phone within reach    Lessie Dings 11/30/2011, 3:47 PM

## 2011-12-03 LAB — CYCLIC CITRUL PEPTIDE ANTIBODY, IGG: Cyclic Citrullin Peptide Ab: 38 U/mL — ABNORMAL HIGH (ref 0.0–5.0)

## 2012-05-09 ENCOUNTER — Encounter (HOSPITAL_COMMUNITY): Payer: Self-pay

## 2012-05-09 ENCOUNTER — Inpatient Hospital Stay (HOSPITAL_COMMUNITY)
Admission: AD | Admit: 2012-05-09 | Discharge: 2012-05-16 | DRG: 393 | Disposition: A | Discharge status: Home / Self Care | Payer: Medicare Other | Source: Ambulatory Visit | Attending: Internal Medicine | Admitting: Internal Medicine

## 2012-05-09 DIAGNOSIS — D5700 Hb-SS disease with crisis, unspecified: Secondary | ICD-10-CM | POA: Diagnosis present

## 2012-05-09 DIAGNOSIS — D62 Acute posthemorrhagic anemia: Secondary | ICD-10-CM | POA: Diagnosis present

## 2012-05-09 DIAGNOSIS — D500 Iron deficiency anemia secondary to blood loss (chronic): Secondary | ICD-10-CM | POA: Diagnosis present

## 2012-05-09 DIAGNOSIS — E039 Hypothyroidism, unspecified: Secondary | ICD-10-CM | POA: Diagnosis present

## 2012-05-09 DIAGNOSIS — D571 Sickle-cell disease without crisis: Secondary | ICD-10-CM

## 2012-05-09 DIAGNOSIS — K633 Ulcer of intestine: Principal | ICD-10-CM

## 2012-05-09 DIAGNOSIS — IMO0002 Reserved for concepts with insufficient information to code with codable children: Secondary | ICD-10-CM

## 2012-05-09 DIAGNOSIS — M069 Rheumatoid arthritis, unspecified: Secondary | ICD-10-CM | POA: Diagnosis present

## 2012-05-09 DIAGNOSIS — E871 Hypo-osmolality and hyponatremia: Secondary | ICD-10-CM | POA: Diagnosis present

## 2012-05-09 DIAGNOSIS — L97809 Non-pressure chronic ulcer of other part of unspecified lower leg with unspecified severity: Secondary | ICD-10-CM | POA: Diagnosis present

## 2012-05-09 LAB — COMPREHENSIVE METABOLIC PANEL
ALT: 18 U/L (ref 0–35)
AST: 60 U/L — ABNORMAL HIGH (ref 0–37)
Albumin: 3.4 g/dL — ABNORMAL LOW (ref 3.5–5.2)
Alkaline Phosphatase: 53 U/L (ref 39–117)
BUN: 4 mg/dL — ABNORMAL LOW (ref 6–23)
CO2: 24 mEq/L (ref 19–32)
Calcium: 9.1 mg/dL (ref 8.4–10.5)
Chloride: 103 mEq/L (ref 96–112)
Creatinine, Ser: 0.68 mg/dL (ref 0.50–1.10)
GFR calc Af Amer: >90 mL/min (ref >90)
GFR calc non Af Amer: >90 mL/min (ref >90)
Glucose, Bld: 94 mg/dL (ref 70–99)
Potassium: 3.3 mEq/L — ABNORMAL LOW (ref 3.5–5.1)
Sodium: 137 mEq/L (ref 135–145)
Total Bilirubin: 3.2 mg/dL — ABNORMAL HIGH (ref 0.3–1.2)
Total Protein: 7.3 g/dL (ref 6.0–8.3)

## 2012-05-09 LAB — CBC WITH DIFFERENTIAL/PLATELET
Band Neutrophils: 0 % (ref 0–10)
Basophils Absolute: 0.1 10*3/uL (ref 0.0–0.1)
Basophils Relative: 1 % (ref 0–1)
Blasts: 0 %
Eosinophils Absolute: 0.1 10*3/uL (ref 0.0–0.7)
Eosinophils Relative: 2 % (ref 0–5)
HCT: 12.3 % — ABNORMAL LOW (ref 36.0–46.0)
Hemoglobin: 4.6 g/dL — CL (ref 12.0–15.0)
Lymphocytes Relative: 37 % (ref 12–46)
Lymphs Abs: 2.6 10*3/uL (ref 0.7–4.0)
MCH: 30.1 pg (ref 26.0–34.0)
MCHC: 37.4 g/dL — ABNORMAL HIGH (ref 30.0–36.0)
MCV: 80.4 fL (ref 78.0–100.0)
Metamyelocytes Relative: 0 %
Monocytes Absolute: 0.3 10*3/uL (ref 0.1–1.0)
Monocytes Relative: 5 % (ref 3–12)
Myelocytes: 0 %
Neutro Abs: 3.8 10*3/uL (ref 1.7–7.7)
Neutrophils Relative %: 55 % (ref 43–77)
Platelets: 171 10*3/uL (ref 150–400)
Promyelocytes Absolute: 0 %
RBC: 1.53 MIL/uL — ABNORMAL LOW (ref 3.87–5.11)
RDW: 28.6 % — ABNORMAL HIGH (ref 11.5–15.5)
WBC: 6.9 10*3/uL (ref 4.0–10.5)
nRBC: 0 /100 WBC

## 2012-05-09 LAB — PREPARE RBC (CROSSMATCH)

## 2012-05-09 MED ORDER — PANTOPRAZOLE SODIUM 40 MG PO TBEC: 40.0000 mg | DELAYED_RELEASE_TABLET | Freq: Two times a day (BID) | ORAL | Status: DC

## 2012-05-09 MED ORDER — LEVOTHYROXINE SODIUM 50 MCG PO TABS
50.0000 ug | ORAL_TABLET | Freq: Every day | ORAL | Status: DC
  Administered 2012-05-10 – 2012-05-16 (×7): 50 ug via ORAL
  Filled 2012-05-09 (×9): qty 1

## 2012-05-09 MED ORDER — KCL IN DEXTROSE-NACL 20-5-0.45 MEQ/L-%-% IV SOLN
INTRAVENOUS | Status: DC
  Administered 2012-05-10: via INTRAVENOUS
  Administered 2012-05-10 – 2012-05-12 (×2): 50 mL/h via INTRAVENOUS
  Administered 2012-05-13 – 2012-05-16 (×4): via INTRAVENOUS
  Filled 2012-05-09 (×12): qty 1000

## 2012-05-09 MED ORDER — HYDROMORPHONE HCL PF 2 MG/ML IJ SOLN
2.0000 mg | INTRAMUSCULAR | Status: DC | PRN
  Administered 2012-05-09: 1 mg via INTRAVENOUS
  Administered 2012-05-10 – 2012-05-14 (×6): 2 mg via INTRAVENOUS
  Filled 2012-05-09 (×7): qty 1

## 2012-05-09 MED ORDER — FOLIC ACID 1 MG PO TABS
1.0000 mg | ORAL_TABLET | Freq: Every day | ORAL | Status: DC
  Administered 2012-05-10 – 2012-05-16 (×7): 1 mg via ORAL
  Filled 2012-05-09 (×7): qty 1

## 2012-05-09 MED ORDER — DIPHENHYDRAMINE HCL 50 MG/ML IJ SOLN: 12.5000 mg | INTRAMUSCULAR | Status: DC | PRN

## 2012-05-09 MED ORDER — ONDANSETRON HCL 4 MG PO TABS: 4.0000 mg | ORAL_TABLET | ORAL | Status: DC | PRN

## 2012-05-09 MED ORDER — DIPHENHYDRAMINE HCL 25 MG PO CAPS: 25.0000 mg | ORAL_CAPSULE | ORAL | Status: DC | PRN

## 2012-05-09 MED ORDER — ONDANSETRON HCL 4 MG/2ML IJ SOLN
4.0000 mg | INTRAMUSCULAR | Status: DC | PRN
  Administered 2012-05-10 – 2012-05-14 (×5): 4 mg via INTRAVENOUS
  Filled 2012-05-09 (×5): qty 2

## 2012-05-09 NOTE — H&P (Signed)
Patient ID: [name], female   DOB: 1960-09-17, 51 y.o.   MRN: [id number]  No chief complaint on file.   HPI [name] is a 51 y.o. female.  Sickle cell anemia who came to office today complaining of abdominal pain. Patient has been experiencing intermittent abdominal pain for several weeks. She describes this as a burning epigastric sensation. Was no associated nausea vomiting hematemesis melena hematochezia. Patient was given dexilant empirically on last seen. She was advised to reduce her intake of NSAIDs i.e. ibuprofen at that time. She states her stomach did feel better thereafter. A CBC was done which revealed a significantly low hemoglobin. Patient describes intermittent lightheadedness and dizziness. She denies any chest pains palpitations or shortness of breath. Her hemoglobin was noted to be low today as well on return. She was admitted to the unit for transfusion.  Past Medical History  Diagnosis Date  . Sickle cell anemia   . Hypothyroidism   . Pneumonia   . Anxiety   . Arthritis   . Ulcers of both lower extremities   . Hypokalemia   . Aneurysm     Past Surgical History  Procedure Date  . Cerebral aneurysm repair     History reviewed. No pertinent family history.  Social History History  Substance Use Topics  . Smoking status: Never Smoker   . Smokeless tobacco: Not on file  . Alcohol Use: No    Allergies  Allergen Reactions  . Ampicillin   . Demerol (Meperidine)     Current Facility-Administered Medications  Medication Dose Route Frequency Provider Last Rate Last Dose  . dextrose 5 % and 0.45 % NaCl with KCl 20 mEq/L infusion   Intravenous Continuous Gwenyth Bender, MD      . diphenhydrAMINE (BENADRYL) capsule 25-50 mg  25-50 mg Oral Q4H PRN Gwenyth Bender, MD       Or  . diphenhydrAMINE (BENADRYL) injection 12.5-25 mg  12.5-25 mg Intravenous Q4H PRN Gwenyth Bender, MD      . folic acid (FOLVITE) tablet 1 mg  1 mg Oral Daily Gwenyth Bender, MD      . HYDROmorphone  (DILAUDID) injection 2-4 mg  2-4 mg Intravenous Q2H PRN Gwenyth Bender, MD      . levothyroxine (SYNTHROID, LEVOTHROID) tablet 50 mcg  50 mcg Oral QAC breakfast Gwenyth Bender, MD      . ondansetron [facility]) tablet 4 mg  4 mg Oral Q4H PRN Gwenyth Bender, MD       Or  . ondansetron [facility]) injection 4 mg  4 mg Intravenous Q4H PRN Gwenyth Bender, MD      . pantoprazole (PROTONIX) EC tablet 40 mg  40 mg Oral BID AC Gwenyth Bender, MD        Review of Systems Recent diagnosis of rheumatoid arthritis. She responded to prednisone for this as well. Patient has had a recent leg ulcer which is persistent. She notes recently that has become more greenish purulence. Denies any increasing pain though still present. Denies fever or night sweats. Patient does not smoke or drink.  Blood pressure 114/65, pulse 60, temperature 98.5 F (36.9 C), temperature source Oral, resp. rate 20, SpO2 95.00%.  Physical Exam Well-developed well-nourished black female presently in no acute distress. HEENT: No sinus tenderness. Positive sclera icterus. TMs with minimal erythema but with good light reflex. Nose without turbinate edema bilaterally. No posterior is clear. NECK: No enlarged thyroid. Small posterior cervical nodes in the left.  CV: Normal S1, S2 without S3. No ectopy.  LUNGS: Clear to auscultation. No vocal fremitus. No CVA tenderness. ABDOMEN: Bowel sounds present. Epigastric tenderness appreciated. No surgery tenderness. No rebound. MSK: No tenderness in the PIP joints bilaterally. Tenderness left wrist. Minimal tenderness in knees. She has a 1.5 cm shallow ulcer in the lower leg. This area was recently cultured. NEURO: Nonfocal.     Data Reviewed  Results for orders placed during the hospital encounter of 05/09/12 (from the past 48 hour(s))  TYPE AND SCREEN     Status: Normal (Preliminary result)   Collection Time   05/09/12 11:45 AM      Component Value Range Comment   ABO/RH(D) B POS      Antibody Screen NEG        Sample Expiration 05/12/2012      Unit Number [id number]      Blood Component Type RED CELLS,LR      Unit division 00      Status of Unit ISSUED      Transfusion Status OK TO TRANSFUSE      Crossmatch Result Compatible      Unit Number [id number]      Blood Component Type RED CELLS,LR      Unit division 00      Status of Unit ISSUED      Transfusion Status OK TO TRANSFUSE      Crossmatch Result Compatible     PREPARE RBC (CROSSMATCH)     Status: Normal   Collection Time   05/09/12 12:00 PM      Component Value Range Comment   Order Confirmation ORDER PROCESSED BY BLOOD BANK     CBC WITH DIFFERENTIAL     Status: Abnormal   Collection Time   05/09/12  1:00 PM      Component Value Range Comment   WBC 6.9  4.0 - 10.5 K/uL    RBC 1.53 (*) 3.87 - 5.11 MIL/uL    Hemoglobin 4.6 (*) 12.0 - 15.0 g/dL    HCT 30.8 (*) 65.7 - 46.0 %    MCV 80.4  78.0 - 100.0 fL    MCH 30.1  26.0 - 34.0 pg    MCHC 37.4 (*) 30.0 - 36.0 g/dL    RDW 84.6 (*) 96.2 - 15.5 %    Platelets 171  150 - 400 K/uL    Neutrophils Relative 55  43 - 77 %    Lymphocytes Relative 37  12 - 46 %    Monocytes Relative 5  3 - 12 %    Eosinophils Relative 2  0 - 5 %    Basophils Relative 1  0 - 1 %    Band Neutrophils 0  0 - 10 %    Metamyelocytes Relative 0      Myelocytes 0      Promyelocytes Absolute 0      Blasts 0      nRBC 0  0 /100 WBC    Neutro Abs 3.8  1.7 - 7.7 K/uL    Lymphs Abs 2.6  0.7 - 4.0 K/uL    Monocytes Absolute 0.3  0.1 - 1.0 K/uL    Eosinophils Absolute 0.1  0.0 - 0.7 K/uL    Basophils Absolute 0.1  0.0 - 0.1 K/uL    RBC Morphology POLYCHROMASIA PRESENT      WBC Morphology WHITE COUNT CONFIRMED ON SMEAR      Smear Review PLATELET COUNT CONFIRMED BY SMEAR  COMPREHENSIVE METABOLIC PANEL     Status: Abnormal   Collection Time   05/09/12  4:20 PM      Component Value Range Comment   Sodium 137  135 - 145 mEq/L    Potassium 3.3 (*) 3.5 - 5.1 mEq/L    Chloride 103  96 - 112 mEq/L    CO2  24  19 - 32 mEq/L    Glucose, Bld 94  70 - 99 mg/dL    BUN 4 (*) 6 - 23 mg/dL    Creatinine, Ser 4.54  0.50 - 1.10 mg/dL    Calcium 9.1  8.4 - 09.8 mg/dL    Total Protein 7.3  6.0 - 8.3 g/dL    Albumin 3.4 (*) 3.5 - 5.2 g/dL    AST 60 (*) 0 - 37 U/L    ALT 18  0 - 35 U/L    Alkaline Phosphatase 53  39 - 117 U/L    Total Bilirubin 3.2 (*) 0.3 - 1.2 mg/dL    GFR calc non Af Amer >90  >90 mL/min    GFR calc Af Amer >90  >90 mL/min    Laboratory data reviewed.   Assessment    Symptomatic anemia. Sickle cell crisis. Progressive anemia. Rule out secondary to occult loss and #2. Abdominal pain with recent use of NSAIDs and prednisone. High risk for gastritis and/or ulcer. Rheumatoid arthritis. Hypokalemia.' Symptomatic. Left leg ulcer rule out Pseudomonas.    Plan    Transfuse 2-3 units of packed RBCs. Patient previous baseline is closer to 8. Continue PPI therapy. Replenish potassium by mouth and IV. Guaiac stools x3. Coordinate GI evaluation. Followup CBC in CM[facility] in a.m. H. pylori.       [name] 05/09/2012, 7:20 PM

## 2012-05-09 NOTE — Progress Notes (Signed)
Patient ID: [name], female   DOB: [birth year], 51 y.o.   MRN: [id number] The lab called with a critical hgb for the patient of 4.6. Dr August Saucer notified.

## 2012-05-09 NOTE — Progress Notes (Signed)
Checked on pt due to pump beeping as it was time for vitals during blood transfusion. Pt states that she needs an emesis bag. Emesis basin given. Pt then states that "It has gone back down." Will administer zofran 4 mg IV once blood transfusion is complete.

## 2012-05-09 NOTE — Progress Notes (Signed)
Pt arrived from medical practice; orders received for type and screen; blood draw completed; no complications noted; VS WNL; will continue to monitor

## 2012-05-10 LAB — CBC
HCT: 23.3 % — ABNORMAL LOW (ref 36.0–46.0)
Hemoglobin: 8.6 g/dL — ABNORMAL LOW (ref 12.0–15.0)
MCH: 29.5 pg (ref 26.0–34.0)
MCHC: 36.9 g/dL — ABNORMAL HIGH (ref 30.0–36.0)
MCV: 79.8 fL (ref 78.0–100.0)
Platelets: 207 10*3/uL (ref 150–400)
RBC: 2.92 MIL/uL — ABNORMAL LOW (ref 3.87–5.11)
RDW: 21.7 % — ABNORMAL HIGH (ref 11.5–15.5)
WBC: 6.4 10*3/uL (ref 4.0–10.5)

## 2012-05-10 LAB — COMPREHENSIVE METABOLIC PANEL
ALT: 16 U/L (ref 0–35)
AST: 59 U/L — ABNORMAL HIGH (ref 0–37)
Albumin: 3.5 g/dL (ref 3.5–5.2)
Alkaline Phosphatase: 52 U/L (ref 39–117)
BUN: 3 mg/dL — ABNORMAL LOW (ref 6–23)
CO2: 26 mEq/L (ref 19–32)
Calcium: 9.1 mg/dL (ref 8.4–10.5)
Chloride: 101 mEq/L (ref 96–112)
Creatinine, Ser: 0.66 mg/dL (ref 0.50–1.10)
GFR calc Af Amer: >90 mL/min (ref >90)
GFR calc non Af Amer: >90 mL/min (ref >90)
Glucose, Bld: 78 mg/dL (ref 70–99)
Potassium: 3.7 mEq/L (ref 3.5–5.1)
Sodium: 136 mEq/L (ref 135–145)
Total Bilirubin: 3.1 mg/dL — ABNORMAL HIGH (ref 0.3–1.2)
Total Protein: 7.5 g/dL (ref 6.0–8.3)

## 2012-05-10 LAB — CBC WITH DIFFERENTIAL/PLATELET
Basophils Absolute: 0 10*3/uL (ref 0.0–0.1)
Basophils Relative: 0 % (ref 0–1)
Eosinophils Absolute: 0.1 10*3/uL (ref 0.0–0.7)
Eosinophils Relative: 2 % (ref 0–5)
HCT: 23.9 % — ABNORMAL LOW (ref 36.0–46.0)
Hemoglobin: 9.1 g/dL — ABNORMAL LOW (ref 12.0–15.0)
Lymphocytes Relative: 31 % (ref 12–46)
Lymphs Abs: 2 10*3/uL (ref 0.7–4.0)
MCH: 30.3 pg (ref 26.0–34.0)
MCHC: 37.6 g/dL — ABNORMAL HIGH (ref 30.0–36.0)
MCV: 79.7 fL (ref 78.0–100.0)
Monocytes Absolute: 0.3 10*3/uL (ref 0.1–1.0)
Monocytes Relative: 4 % (ref 3–12)
Neutro Abs: 4.2 10*3/uL (ref 1.7–7.7)
Neutrophils Relative %: 63 % (ref 43–77)
Platelets: 224 10*3/uL (ref 150–400)
RBC: 3 MIL/uL — ABNORMAL LOW (ref 3.87–5.11)
RDW: 21.7 % — ABNORMAL HIGH (ref 11.5–15.5)
WBC: 6.6 10*3/uL (ref 4.0–10.5)

## 2012-05-10 LAB — PRO B NATRIURETIC PEPTIDE: Pro B Natriuretic peptide (BNP): 214 pg/mL — ABNORMAL HIGH (ref 0–125)

## 2012-05-10 LAB — TROPONIN I
Troponin I: <0.3 ng/mL (ref <0.30)
Troponin I: <0.3 ng/mL (ref <0.30)
Troponin I: <0.3 ng/mL (ref <0.30)

## 2012-05-10 LAB — PREPARE RBC (CROSSMATCH)

## 2012-05-10 MED ORDER — ASPIRIN 81 MG PO CHEW
324.0000 mg | CHEWABLE_TABLET | Freq: Once | ORAL | Status: AC
  Administered 2012-05-10: 324 mg via ORAL
  Filled 2012-05-10: qty 4

## 2012-05-10 MED ORDER — PEG 3350-KCL-NA BICARB-NACL 420 G PO SOLR: 4000.0000 mL | Freq: Once | ORAL | Status: DC

## 2012-05-10 MED ORDER — POTASSIUM CHLORIDE CRYS ER 20 MEQ PO TBCR
40.0000 meq | EXTENDED_RELEASE_TABLET | Freq: Once | ORAL | Status: AC
  Administered 2012-05-10: 40 meq via ORAL
  Filled 2012-05-10: qty 2

## 2012-05-10 MED ORDER — PANTOPRAZOLE SODIUM 40 MG IV SOLR
40.0000 mg | Freq: Two times a day (BID) | INTRAVENOUS | Status: DC
  Administered 2012-05-10 – 2012-05-16 (×13): 40 mg via INTRAVENOUS
  Filled 2012-05-10 (×14): qty 40

## 2012-05-10 NOTE — Progress Notes (Signed)
On assessment, pt states has chest pain; ekg obtained in at bedside; MD notified of results; bedside oxygen by nasal cannula given; new orders to transfer pt. Received; will continue to monitor

## 2012-05-10 NOTE — Consult Note (Signed)
Consult for [facility]  Reason for Consult: Anemia and ABM pain Referring Physician: Willey Blade, M.D.  [name] HPI: This is a 51 year old female admitted for symptomatic anemia and abdominal pain.  For approximately one month the patient reports having issues with epigastric abdominal pain.  She was treated with Dexilant recently and she reports a resolution of her pain, however, she presented to Dr. August Saucer with epigastric pain.  Further evaluation of her blood revealed that she was markedly anemic with an HGB of 4.6 g/dL.  She was transfused and her HGB increased in to the 9 range, which is slightly above her baseline.  No prior EGD or colonoscopy in the past.  She denies any family history of colon cancer and she denies any problems with hematochezia, melena, nausea, vomiting, hematemesis, hemoptysis, or hematuria.  Past Medical History  Diagnosis Date  . Sickle cell anemia   . Hypothyroidism   . Pneumonia   . Anxiety   . Arthritis   . Ulcers of both lower extremities   . Hypokalemia   . Aneurysm     Past Surgical History  Procedure Date  . Cerebral aneurysm repair     History reviewed. No pertinent family history.  Social History:  reports that she has never smoked. She does not have any smokeless tobacco history on file. She reports that she does not drink alcohol or use illicit drugs.  Allergies:  Allergies  Allergen Reactions  . Ampicillin   . Demerol (Meperidine)     Medications:  Scheduled:   . aspirin  324 mg Oral Once  . folic acid  1 mg Oral Daily  . levothyroxine  50 mcg Oral QAC breakfast  . pantoprazole (PROTONIX) IV  40 mg Intravenous Q12H  . potassium chloride  40 mEq Oral Once  . DISCONTD: pantoprazole  40 mg Oral BID AC   Continuous:   . dextrose 5 % and 0.45 % NaCl with KCl 20 mEq/L 50 mL/hr (05/10/12 1212)    Results for orders placed during the hospital encounter of 05/09/12 (from the past 24 hour(s))  COMPREHENSIVE METABOLIC PANEL     Status:  Abnormal   Collection Time   05/09/12  4:20 PM      Component Value Range   Sodium 137  135 - 145 mEq/L   Potassium 3.3 (*) 3.5 - 5.1 mEq/L   Chloride 103  96 - 112 mEq/L   CO2 24  19 - 32 mEq/L   Glucose, Bld 94  70 - 99 mg/dL   BUN 4 (*) 6 - 23 mg/dL   Creatinine, Ser 1.61  0.50 - 1.10 mg/dL   Calcium 9.1  8.4 - 09.6 mg/dL   Total Protein 7.3  6.0 - 8.3 g/dL   Albumin 3.4 (*) 3.5 - 5.2 g/dL   AST 60 (*) 0 - 37 U/L   ALT 18  0 - 35 U/L   Alkaline Phosphatase 53  39 - 117 U/L   Total Bilirubin 3.2 (*) 0.3 - 1.2 mg/dL   GFR calc non Af Amer >90  >90 mL/min   GFR calc Af Amer >90  >90 mL/min  PREPARE RBC (CROSSMATCH)     Status: Normal   Collection Time   05/09/12  8:00 PM      Component Value Range   Order Confirmation ORDER PROCESSED BY BLOOD BANK    PREPARE RBC (CROSSMATCH)     Status: Normal   Collection Time   05/10/12  9:00 AM  Component Value Range   Order Confirmation ORDER PROCESSED BY BLOOD BANK    CBC WITH DIFFERENTIAL     Status: Abnormal   Collection Time   05/10/12  9:53 AM      Component Value Range   WBC 6.6  4.0 - 10.5 K/uL   RBC 3.00 (*) 3.87 - 5.11 MIL/uL   Hemoglobin 9.1 (*) 12.0 - 15.0 g/dL   HCT 40.9 (*) 81.1 - 91.4 %   MCV 79.7  78.0 - 100.0 fL   MCH 30.3  26.0 - 34.0 pg   MCHC 37.6 (*) 30.0 - 36.0 g/dL   RDW 78.2 (*) 95.6 - 21.3 %   Platelets 224  150 - 400 K/uL   Neutrophils Relative 63  43 - 77 %   Lymphocytes Relative 31  12 - 46 %   Monocytes Relative 4  3 - 12 %   Eosinophils Relative 2  0 - 5 %   Basophils Relative 0  0 - 1 %   Neutro Abs 4.2  1.7 - 7.7 K/uL   Lymphs Abs 2.0  0.7 - 4.0 K/uL   Monocytes Absolute 0.3  0.1 - 1.0 K/uL   Eosinophils Absolute 0.1  0.0 - 0.7 K/uL   Basophils Absolute 0.0  0.0 - 0.1 K/uL   RBC Morphology POLYCHROMASIA PRESENT     WBC Morphology VACUOLATED NEUTROPHILS     Smear Review LARGE PLATELETS PRESENT    COMPREHENSIVE METABOLIC PANEL     Status: Abnormal   Collection Time   05/10/12  9:53 AM       Component Value Range   Sodium 136  135 - 145 mEq/L   Potassium 3.7  3.5 - 5.1 mEq/L   Chloride 101  96 - 112 mEq/L   CO2 26  19 - 32 mEq/L   Glucose, Bld 78  70 - 99 mg/dL   BUN 3 (*) 6 - 23 mg/dL   Creatinine, Ser 0.86  0.50 - 1.10 mg/dL   Calcium 9.1  8.4 - 57.8 mg/dL   Total Protein 7.5  6.0 - 8.3 g/dL   Albumin 3.5  3.5 - 5.2 g/dL   AST 59 (*) 0 - 37 U/L   ALT 16  0 - 35 U/L   Alkaline Phosphatase 52  39 - 117 U/L   Total Bilirubin 3.1 (*) 0.3 - 1.2 mg/dL   GFR calc non Af Amer >90  >90 mL/min   GFR calc Af Amer >90  >90 mL/min  TROPONIN I     Status: Normal   Collection Time   05/10/12  9:53 AM      Component Value Range   Troponin I <0.30  <0.30 ng/mL  PRO B NATRIURETIC PEPTIDE     Status: Abnormal   Collection Time   05/10/12  9:53 AM      Component Value Range   Pro B Natriuretic peptide (BNP) 214.0 (*) 0 - 125 pg/mL  TROPONIN I     Status: Normal   Collection Time   05/10/12  2:20 PM      Component Value Range   Troponin I <0.30  <0.30 ng/mL     No results found.  ROS:  As stated above in the HPI otherwise negative.  Blood pressure 112/63, pulse 48, temperature 97.5 F (36.4 C), temperature source Oral, resp. rate 16, height 5\' 11"  (1.803 m), weight 67.314 kg (148 lb 6.4 oz), SpO2 100.00%.    PE: Gen: NAD, Alert and Oriented HEENT:  [place]/AT, EOMI Neck: Supple, no LAD Lungs: CTA Bilaterally CV: RRR without M/G/R ABM: Soft, NTND, +BS Ext: No C/C/E Rectal: Heme positive stool, no rectal masses, formed brown stool  Assessment/Plan: 1) Anemia. 2) Heme positive stool. 3) Recent history of epigastric pain.   The patient appears to be improved.  I am somewhat puzzled about her epigastric pain complaints as she reported resolution with Dexilant, but Dr. August Saucer noted that she had epigastric pain on admission.  No current pain with palpation.  Clinically her symptoms are consistent with esophagitis or PUD.  Further evaluation is required with an EGD and  colonoscopy.  She is over the age of 45 with severe anemia and heme positive stool.  Plan:1 1) EGD/Colonoscopy tomorrow.  Emmy Keng D 05/10/2012, 4:00 PM

## 2012-05-10 NOTE — Progress Notes (Signed)
Pt IV saline locked for transfer; Pt transferred to [address] by wheelchair; report given to Amy, RN; all questions answered; no complications noted

## 2012-05-10 NOTE — Progress Notes (Signed)
Subjective:  Patient complains of musculoskeletal chest pain. Denies any abdominal pain or melena. Denies any palpitation lightheadedness or dizziness. Noted to have occasional nonconducted APCs on the monitor  Objective:  Vital Signs in the last 24 hours: Temp:  [97.3 F (36.3 C)-98.5 F (36.9 C)] 98 F (36.7 C) (10/19 0907) Pulse Rate:  [52-73] 52  (10/19 0907) Resp:  [16-20] 16  (10/19 0907) BP: (102-143)/(53-86) 124/72 mmHg (10/19 0907) SpO2:  [91 %-100 %] 100 % (10/19 0907) Weight:  [67.314 kg (148 lb 6.4 oz)] 67.314 kg (148 lb 6.4 oz) (10/19 0907)  Intake/Output from previous day: 10/18 0701 - 10/19 0700 In: 1127.5 [P.O.:240; I.V.:25; Blood:862.5] Out: -  Intake/Output from this shift: Total I/O In: 411.7 [I.V.:411.7] Out: -   Physical Exam: Neck: no adenopathy, no carotid bruit, no JVD and supple, symmetrical, trachea midline Lungs: clear to auscultation bilaterally Heart: regular rate and rhythm, S1, S2 normal and Soft systolic murmur noted Abdomen: soft, non-tender; bowel sounds normal; no masses,  no organomegaly Extremities: extremities normal, atraumatic, no cyanosis or edema  Lab Results:  Basename 05/10/12 0953 05/09/12 1300  WBC 6.6 6.9  HGB 9.1* 4.6*  PLT 224 171    Basename 05/10/12 0953 05/09/12 1620  NA 136 137  K 3.7 3.3*  CL 101 103  CO2 26 24  GLUCOSE 78 94  BUN 3* 4*  CREATININE 0.66 0.68    Basename 05/10/12 0953  TROPONINI <0.30   Hepatic Function Panel  Basename 05/10/12 0953  PROT 7.5  ALBUMIN 3.5  AST 59*  ALT 16  ALKPHOS 52  BILITOT 3.1*  BILIDIR --  IBILI --   No results found for this basename: CHOL in the last 72 hours No results found for this basename: PROTIME in the last 72 hours  Imaging: Imaging results have been reviewed  Cardiac Studies:  Assessment/Plan:  Resolving sickle cell crisis Acute on chronic anemia rule out GI loss rule out gastritis rule out peptic ulcer disease Hypothyroidism Degenerative  joint disease Chronic left leg ulcer Plan Continue present management Check serial H&H GI consult  LOS: 1 day    Joeph Szatkowski N 05/10/2012, 2:42 PM

## 2012-05-10 NOTE — Progress Notes (Signed)
Patient HR 33- 47 -  BP stable - patient asymptomatic. Dr Sharyn Lull updated, will continue to monitor.

## 2012-05-11 ENCOUNTER — Encounter (HOSPITAL_COMMUNITY)
Admission: AD | Disposition: A | Discharge status: Home / Self Care | Payer: Self-pay | Source: Ambulatory Visit | Attending: Internal Medicine

## 2012-05-11 LAB — BASIC METABOLIC PANEL
BUN: 3 mg/dL — ABNORMAL LOW (ref 6–23)
CO2: 27 mEq/L (ref 19–32)
Calcium: 8.9 mg/dL (ref 8.4–10.5)
Chloride: 100 mEq/L (ref 96–112)
Creatinine, Ser: 0.73 mg/dL (ref 0.50–1.10)
GFR calc Af Amer: >90 mL/min (ref >90)
GFR calc non Af Amer: >90 mL/min (ref >90)
Glucose, Bld: 122 mg/dL — ABNORMAL HIGH (ref 70–99)
Potassium: 4.1 mEq/L (ref 3.5–5.1)
Sodium: 134 mEq/L — ABNORMAL LOW (ref 135–145)

## 2012-05-11 LAB — CBC
HCT: 22.1 % — ABNORMAL LOW (ref 36.0–46.0)
Hemoglobin: 8.3 g/dL — ABNORMAL LOW (ref 12.0–15.0)
MCH: 29.9 pg (ref 26.0–34.0)
MCHC: 37.6 g/dL — ABNORMAL HIGH (ref 30.0–36.0)
MCV: 79.5 fL (ref 78.0–100.0)
Platelets: 188 10*3/uL (ref 150–400)
RBC: 2.78 MIL/uL — ABNORMAL LOW (ref 3.87–5.11)
RDW: 22 % — ABNORMAL HIGH (ref 11.5–15.5)
WBC: 7 10*3/uL (ref 4.0–10.5)

## 2012-05-11 SURGERY — COLONOSCOPY WITH ESOPHAGOGASTRODUODENOSCOPY (EGD)
Anesthesia: Moderate Sedation | Laterality: Left

## 2012-05-11 MED ORDER — POLYETHYLENE GLYCOL 3350 17 GM/SCOOP PO POWD
1.0000 | Freq: Once | ORAL | Status: AC
  Administered 2012-05-11: 1 via ORAL
  Filled 2012-05-11: qty 255

## 2012-05-11 NOTE — Progress Notes (Signed)
Progress Note for [name]  Subjective: No acute events.  Objective: Vital signs in last 24 hours: Temp:  [97.1 F (36.2 C)-98.3 F (36.8 C)] 97.1 F (36.2 C) (10/20 0524) Pulse Rate:  [48-75] 71  (10/20 0524) Resp:  [16-20] 20  (10/20 0524) BP: (99-112)/(56-63) 107/61 mmHg (10/20 0524) SpO2:  [94 %-100 %] 94 % (10/20 0524) Weight:  [67.178 kg (148 lb 1.6 oz)] 67.178 kg (148 lb 1.6 oz) (10/20 0524) Last BM Date: 05/11/12  Intake/Output from previous day: 10/19 0701 - 10/20 0700 In: 1525.8 [P.O.:240; I.V.:1285.8] Out: -  Intake/Output this shift:    General appearance: alert and no distress Hall: soft, non-tender; bowel sounds normal; no masses,  no organomegaly  Lab Results:  Basename 05/11/12 0441 05/10/12 1900 05/10/12 0953  WBC 7.0 6.4 6.6  HGB 8.3* 8.6* 9.1*  HCT 22.1* 23.3* 23.9*  PLT 188 207 224   BMET  Basename 05/11/12 0441 05/10/12 0953 05/09/12 1620  NA 134* 136 137  K 4.1 3.7 3.3*  CL 100 101 103  CO2 27 26 24   GLUCOSE 122* 78 94  BUN 3* 3* 4*  CREATININE 0.73 0.66 0.68  CALCIUM 8.9 9.1 9.1   LFT  Basename 05/10/12 0953  PROT 7.5  ALBUMIN 3.5  AST 59*  ALT 16  ALKPHOS 52  BILITOT 3.1*  BILIDIR --  IBILI --   PT/INR No results found for this basename: LABPROT:2,INR:2 in the last 72 hours Hepatitis Panel No results found for this basename: HEPBSAG,HCVAB,HEPAIGM,HEPBIGM in the last 72 hours C-Diff No results found for this basename: CDIFFTOX:3 in the last 72 hours Fecal Lactopherrin No results found for this basename: FECLLACTOFRN in the last 72 hours  Studies/Results: No results found.  Medications:  Scheduled:   . folic acid  1 mg Oral Daily  . levothyroxine  50 mcg Oral QAC breakfast  . pantoprazole (PROTONIX) IV  40 mg Intravenous Q12H  . polyethylene glycol-electrolytes  4,000 mL Oral Once  . potassium chloride  40 mEq Oral Once   Continuous:   . dextrose 5 % and 0.45 % NaCl with KCl 20 mEq/L 50 mL/hr at 05/11/12 0641     Assessment/Plan: 1) Anemia. 2) Heme positive stool   The patient was not provided the prep until 11 PM last night.  Clinically she remains stable.  Plan: 1) Clear liquid diet. 2) Stop the Golytely and continue with a Miralax prep later in the day. 3) Dr. Juanda Chance to perform the EGD/Colonoscopy.  LOS: 2 days   Navon Kotowski D 05/11/2012, 10:46 AM

## 2012-05-11 NOTE — Progress Notes (Signed)
Subjective:  Patient denies any chest pain or abdominal pain. Complains of knee joint pains. Appreciate GI consult and help  Objective:  Vital Signs in the last 24 hours: Temp:  [97.1 F (36.2 C)-98.3 F (36.8 C)] 97.1 F (36.2 C) (10/20 0524) Pulse Rate:  [48-75] 71  (10/20 0524) Resp:  [16-20] 20  (10/20 0524) BP: (99-112)/(56-63) 107/61 mmHg (10/20 0524) SpO2:  [94 %-100 %] 94 % (10/20 0524) Weight:  [67.178 kg (148 lb 1.6 oz)] 67.178 kg (148 lb 1.6 oz) (10/20 0524)  Intake/Output from previous day: 10/19 0701 - 10/20 0700 In: 1525.8 [P.O.:240; I.V.:1285.8] Out: -  Intake/Output from this shift:    Physical Exam: Neck: no adenopathy, no carotid bruit, no JVD and supple, symmetrical, trachea midline Lungs: clear to auscultation bilaterally Heart: regular rate and rhythm, S1, S2 normal, no murmur, click, rub or gallop Abdomen: soft, non-tender; bowel sounds normal; no masses,  no organomegaly Extremities: extremities normal, atraumatic, no cyanosis or edema  Lab Results:  Basename 05/11/12 0441 05/10/12 1900  WBC 7.0 6.4  HGB 8.3* 8.6*  PLT 188 207    Basename 05/11/12 0441 05/10/12 0953  NA 134* 136  K 4.1 3.7  CL 100 101  CO2 27 26  GLUCOSE 122* 78  BUN 3* 3*  CREATININE 0.73 0.66    Basename 05/10/12 2000 05/10/12 1420  TROPONINI <0.30 <0.30   Hepatic Function Panel  Basename 05/10/12 0953  PROT 7.5  ALBUMIN 3.5  AST 59*  ALT 16  ALKPHOS 52  BILITOT 3.1*  BILIDIR --  IBILI --   No results found for this basename: CHOL in the last 72 hours No results found for this basename: PROTIME in the last 72 hours  Imaging: Imaging results have been reviewed and No results found.  Cardiac Studies:  Assessment/Plan:  Resolving sickle cell crisis  Acute on chronic anemia rule out GI loss rule out gastritis rule out peptic ulcer disease  Hypothyroidism  Degenerative joint disease  Chronic left leg ulcer  Plan  Continue present management Schedule for  EGD and colonoscopy in a.m.  LOS: 2 days    Candice Tobey N 05/11/2012, 12:12 PM

## 2012-05-12 ENCOUNTER — Encounter (HOSPITAL_COMMUNITY): Payer: Self-pay

## 2012-05-12 ENCOUNTER — Encounter (HOSPITAL_COMMUNITY)
Admission: AD | Disposition: A | Discharge status: Home / Self Care | Payer: Self-pay | Source: Ambulatory Visit | Attending: Internal Medicine

## 2012-05-12 DIAGNOSIS — L98499 Non-pressure chronic ulcer of skin of other sites with unspecified severity: Secondary | ICD-10-CM

## 2012-05-12 DIAGNOSIS — D571 Sickle-cell disease without crisis: Secondary | ICD-10-CM

## 2012-05-12 DIAGNOSIS — K633 Ulcer of intestine: Principal | ICD-10-CM

## 2012-05-12 DIAGNOSIS — D5700 Hb-SS disease with crisis, unspecified: Secondary | ICD-10-CM

## 2012-05-12 HISTORY — PX: ESOPHAGOGASTRODUODENOSCOPY: SHX5428

## 2012-05-12 HISTORY — PX: COLONOSCOPY: SHX5424

## 2012-05-12 SURGERY — EGD (ESOPHAGOGASTRODUODENOSCOPY)
Anesthesia: Moderate Sedation

## 2012-05-12 MED ORDER — MIDAZOLAM HCL 10 MG/2ML IJ SOLN
INTRAMUSCULAR | Status: AC
  Filled 2012-05-12: qty 4

## 2012-05-12 MED ORDER — DIPHENHYDRAMINE HCL 50 MG/ML IJ SOLN
INTRAMUSCULAR | Status: AC
  Filled 2012-05-12: qty 1

## 2012-05-12 MED ORDER — SODIUM CHLORIDE 0.9 % IV SOLN: INTRAVENOUS | Status: DC

## 2012-05-12 MED ORDER — FENTANYL CITRATE 0.05 MG/ML IJ SOLN
INTRAMUSCULAR | Status: DC | PRN
  Administered 2012-05-12 (×4): 25 ug via INTRAVENOUS

## 2012-05-12 MED ORDER — MIDAZOLAM HCL 10 MG/2ML IJ SOLN
INTRAMUSCULAR | Status: DC | PRN
  Administered 2012-05-12 (×4): 2.5 mg via INTRAVENOUS

## 2012-05-12 MED ORDER — FENTANYL CITRATE 0.05 MG/ML IJ SOLN
INTRAMUSCULAR | Status: AC
  Filled 2012-05-12: qty 4

## 2012-05-12 NOTE — Op Note (Signed)
[facility] [address] [place], [address]   ENDOSCOPY PROCEDURE REPORT  PATIENT: [name], [name]  MR#: [id number] BIRTHDATE: 1960/09/30 , 51  yrs. old GENDER: Female ENDOSCOPIST: Hart Carwin, MD REFERRED BY:  Willey Blade, M.D. PROCEDURE DATE:  05/12/2012 PROCEDURE:  EGD, diagnostic ASA CLASS:     Class II INDICATIONS:  unexplained iron deficiency anemia.   heme positive stool. MEDICATIONS: These medications were titrated to patient response per physician's verbal order, Fentanyl-Detailed 25 ug IV, Fentanyl-Quick Pick, and Versed-Detailed 2.5 mg IV TOPICAL ANESTHETIC: Cetacaine Spray  DESCRIPTION OF PROCEDURE: After the risks benefits and alternatives of the procedure were thoroughly explained, informed consent was obtained.  The [id number] ([id number]) endoscope was introduced through the mouth and advanced to the second portion of the duodenum. Without limitations.  The instrument was slowly withdrawn as the mucosa was fully examined.    The upper, middle and distal third of the esophagus were carefully inspected and no abnormalities were noted.  The z-line was well seen at the GEJ.  The endoscope was pushed into the fundus which was normal including a retroflexed view.  The antrum, gastric body, first and second part of the duodenum were unremarkable. Retroflexed views revealed no abnormalities.     The scope was then withdrawn from the patient and the procedure completed.  COMPLICATIONS: There were no complications. ENDOSCOPIC IMPRESSION: Normal EGD ,nothing to account for the anemia and heme positive stool  RECOMMENDATIONS:  REPEAT EXAM: In   no recall EGD  eSigned:  Hart Carwin, MD 05/12/2012 11:46 AM   CC:

## 2012-05-12 NOTE — Op Note (Signed)
[facility] [address] [place], [address]   COLONOSCOPY PROCEDURE REPORT  PATIENT: [name], [name]  MR#: [id number] BIRTHDATE: 04-12-1961 , 51  yrs. old GENDER: Female ENDOSCOPIST: Hart Carwin, MD REFERRED BY:  Willey Blade, M.D. PROCEDURE DATE:  05/12/2012 PROCEDURE:   Colonoscopy with biopsy ASA CLASS:   Class II INDICATIONS:anemia, non-specific, heme-positive stool, and hx Gibson anemia, ,admitted with Hgb 4.6, elevated AST and total bili. MEDICATIONS: These medications were titrated to patient response per physician's verbal order, Fentanyl-Detailed 75 mg IV, and Versed-Detailed 7.5 mg IV  DESCRIPTION OF PROCEDURE:   After the risks and benefits and of the procedure were explained, informed consent was obtained.  A digital rectal exam revealed no abnormalities of the rectum.    The Pentax Ped Colon [id number]  endoscope was introduced through the anus and advanced to the cecum, which was identified by both the appendix and ileocecal valve .  The quality of the prep was good, using MoviPrep .  The instrument was then slowly withdrawn as the colon was fully examined.     COLON FINDINGS: Tiny 3 mm fresh appearing ulcer on the ileocecal valve, small amount of blood and clot ovelying it, biopsies taken, ulcer does not resemble aphthous ulceration.     Retroflexed views revealed no abnormalities.     The scope was then withdrawn from the patient and the procedure completed.  COMPLICATIONS: There were no complications. ENDOSCOPIC IMPRESSION: Tiny 3 mm fresh appearing ulcer at the ileocecal valve, small amount of blood and clot ovelying it, biopsies taken, ulcer does not resemble aphthous ulceration Normal ileocecal valve otherwise,  RECOMMENDATIONS: 1.  await pathology results 2.  Upper endoscopy nothing to account for Hgb 4.6, unless pt has additional ulcers in the small bowl, consider SBCE if EGD negative  REPEAT EXAM: no  recall  cc:  _______________________________ eSignedHart Carwin, MD 05/12/2012 11:38 AM     PATIENT NAME:  [name], [name] MR#: [id number]

## 2012-05-12 NOTE — Progress Notes (Signed)
[facility] Progress Note  Subjective:  No new Hgb this AM.  For EGD/colon later today.  Drank and tolerated prep.  BM's yellowish in color with small amount of gritty brown residue.  Objective:  Vital signs in last 24 hours: Temp:  [98 F (36.7 C)-98.8 F (37.1 C)] 98.8 F (37.1 C) (10/21 0601) Pulse Rate:  [53-57] 53  (10/21 0601) Resp:  [18-20] 18  (10/21 0601) BP: (113-114)/(66-68) 113/66 mmHg (10/21 0601) SpO2:  [92 %-96 %] 92 % (10/21 0601) Last BM Date: 05/11/12 General:   Alert, Well-developed, in NAD Heart:  Regular rate and rhythm; no murmurs Pulm:  CTAB.  No W/R/R. Abdomen:  Soft, nontender and nondistended. Normal bowel sounds, without guarding, and without rebound.   Extremities:  Without edema. Neurologic:  Alert and  oriented x4;  grossly normal neurologically. Psych:  Alert and cooperative. Normal mood and affect.  Intake/Output from previous day: 10/20 0701 - 10/21 0700 In: 2125.8 [P.O.:960; I.V.:1165.8] Out: -   Lab Results:  Basename 05/11/12 0441 05/10/12 1900 05/10/12 0953  WBC 7.0 6.4 6.6  HGB 8.3* 8.6* 9.1*  HCT 22.1* 23.3* 23.9*  PLT 188 207 224   BMET  Basename 05/11/12 0441 05/10/12 0953 05/09/12 1620  NA 134* 136 137  K 4.1 3.7 3.3*  CL 100 101 103  CO2 27 26 24   GLUCOSE 122* 78 94  BUN 3* 3* 4*  CREATININE 0.73 0.66 0.68  CALCIUM 8.9 9.1 9.1   LFT  Basename 05/10/12 0953  PROT 7.5  ALBUMIN 3.5  AST 59*  ALT 16  ALKPHOS 52  BILITOT 3.1*  BILIDIR --  IBILI --    Assessment / Plan: -Anemia and heme positive stools-s/p transfuse with 2 units of PRBC's. -Sickle cell disease  *For EGD and colonoscopy today. *Continue to monitor Hgb and transfuse if needed.    LOS: 3 days   ZEHR, JESSICA D.  05/12/2012, 8:16 AM  Pager number [phone]    I have reviewed the above note, examined the patient and agree with plan of treatment. EGD and colonoscopy completes: normal EGD, colonoscopy shows a small erosion/ulceration  on the ileo-cecal valve, appears non specific and not likely source of the blood loss. We will proceed with a SBCE tomorrow.  Willa [facility] Pager # [phone].

## 2012-05-12 NOTE — Interval H&P Note (Signed)
History and Physical Interval Note:  05/12/2012 10:56 AM  [name]  has presented today for surgery, with the diagnosis of Heme positive stool and anemia  The various methods of treatment have been discussed with the patient and family. After consideration of risks, benefits and other options for treatment, the patient has consented to  Procedure(s) (LRB) with comments: ESOPHAGOGASTRODUODENOSCOPY (EGD) (N/A) COLONOSCOPY (N/A) as a surgical intervention .  The patient's history has been reviewed, patient examined, no change in status, stable for surgery.  I have reviewed the patient's chart and labs.  Questions were answered to the patient's satisfaction.     Lina Sar

## 2012-05-13 ENCOUNTER — Encounter (HOSPITAL_COMMUNITY): Payer: Self-pay | Admitting: Internal Medicine

## 2012-05-13 ENCOUNTER — Encounter (HOSPITAL_COMMUNITY): Payer: Self-pay

## 2012-05-13 ENCOUNTER — Encounter (HOSPITAL_COMMUNITY)
Admission: AD | Disposition: A | Discharge status: Home / Self Care | Payer: Self-pay | Source: Ambulatory Visit | Attending: Internal Medicine

## 2012-05-13 HISTORY — PX: GIVENS CAPSULE STUDY: SHX5432

## 2012-05-13 LAB — TYPE AND SCREEN
ABO/RH(D): B POS
Antibody Screen: NEGATIVE
Unit division: 0
Unit division: 0
Unit division: 0
Unit division: 0
Unit division: 0

## 2012-05-13 LAB — CBC WITH DIFFERENTIAL/PLATELET
Basophils Absolute: 0.1 10*3/uL (ref 0.0–0.1)
Basophils Relative: 1 % (ref 0–1)
Eosinophils Absolute: 0.2 10*3/uL (ref 0.0–0.7)
Eosinophils Relative: 3 % (ref 0–5)
HCT: 22.1 % — ABNORMAL LOW (ref 36.0–46.0)
Hemoglobin: 8.3 g/dL — ABNORMAL LOW (ref 12.0–15.0)
Lymphocytes Relative: 31 % (ref 12–46)
Lymphs Abs: 1.8 10*3/uL (ref 0.7–4.0)
MCH: 29.3 pg (ref 26.0–34.0)
MCHC: 37.6 g/dL — ABNORMAL HIGH (ref 30.0–36.0)
MCV: 78.1 fL (ref 78.0–100.0)
Monocytes Absolute: 0.9 10*3/uL (ref 0.1–1.0)
Monocytes Relative: 16 % — ABNORMAL HIGH (ref 3–12)
Neutro Abs: 2.9 10*3/uL (ref 1.7–7.7)
Neutrophils Relative %: 49 % (ref 43–77)
Platelets: 182 10*3/uL (ref 150–400)
RBC: 2.83 MIL/uL — ABNORMAL LOW (ref 3.87–5.11)
RDW: 21.4 % — ABNORMAL HIGH (ref 11.5–15.5)
WBC: 5.9 10*3/uL (ref 4.0–10.5)
nRBC: 15 /100 WBC — ABNORMAL HIGH

## 2012-05-13 LAB — COMPREHENSIVE METABOLIC PANEL
ALT: 18 U/L (ref 0–35)
AST: 78 U/L — ABNORMAL HIGH (ref 0–37)
Albumin: 3.4 g/dL — ABNORMAL LOW (ref 3.5–5.2)
Alkaline Phosphatase: 54 U/L (ref 39–117)
BUN: <3 mg/dL — ABNORMAL LOW (ref 6–23)
CO2: 28 mEq/L (ref 19–32)
Calcium: 9.1 mg/dL (ref 8.4–10.5)
Chloride: 97 mEq/L (ref 96–112)
Creatinine, Ser: 0.69 mg/dL (ref 0.50–1.10)
GFR calc Af Amer: >90 mL/min (ref >90)
GFR calc non Af Amer: >90 mL/min (ref >90)
Glucose, Bld: 98 mg/dL (ref 70–99)
Potassium: 3.9 mEq/L (ref 3.5–5.1)
Sodium: 131 mEq/L — ABNORMAL LOW (ref 135–145)
Total Bilirubin: 3.3 mg/dL — ABNORMAL HIGH (ref 0.3–1.2)
Total Protein: 7.5 g/dL (ref 6.0–8.3)

## 2012-05-13 SURGERY — IMAGING PROCEDURE, GI TRACT, INTRALUMINAL, VIA CAPSULE
Anesthesia: LOCAL

## 2012-05-13 SURGICAL SUPPLY — 1 items: TOWEL COTTON PACK 4EA (MISCELLANEOUS) ×4 IMPLANT

## 2012-05-13 NOTE — Progress Notes (Signed)
Subjective Pt does not feel well, ,LUQ abd . pain, foot pain,  Objective: s/p EGD and colonoscopy yesterday without significant findings to explain anemia, small ulcer at the ileo cecal valve found, biopsy pending Vital signs in last 24 hours: Temp:  [98.5 F (36.9 C)-98.7 F (37.1 C)] 98.7 F (37.1 C) (10/22 0510) Pulse Rate:  [47-80] 52  (10/22 0510) Resp:  [9-28] 18  (10/22 0510) BP: (108-141)/(61-74) 121/67 mmHg (10/22 0510) SpO2:  [90 %-100 %] 97 % (10/22 0510) Last BM Date: 05/12/12 General:   Alert,  pleasant, cooperative in NAD Head:  Normocephalic and atraumatic. Eyes:  Sclera clear, no icterus.   Conjunctiva pink. Mouth:  No deformity or lesions, dentition normal. Neck:  Supple; no masses or thyromegaly. Heart:  Regular rate and rhythm; no murmurs, clicks, rubs,  or gallops. Lungs:  No wheezes or rales Abdomen:  LUQ tenderness, no rebound or fullness, normal B.S.'s,  Msk:  Symmetrical without gross deformities. Normal posture. Pulses:  Normal pulses noted. Extremities:  Without clubbing or edema. Neurologic:  Alert and  oriented x4;  grossly normal neurologically. Skin:  Intact without significant lesions or rashes.  Intake/Output from previous day: 10/21 0701 - 10/22 0700 In: 2013.3 [P.O.:840; I.V.:1173.3] Out: -  Intake/Output this shift:    Lab Results:  Basename 05/11/12 0441 05/10/12 1900 05/10/12 0953  WBC 7.0 6.4 6.6  HGB 8.3* 8.6* 9.1*  HCT 22.1* 23.3* 23.9*  PLT 188 207 224   BMET  Basename 05/11/12 0441 05/10/12 0953  NA 134* 136  K 4.1 3.7  CL 100 101  CO2 27 26  GLUCOSE 122* 78  BUN 3* 3*  CREATININE 0.73 0.66  CALCIUM 8.9 9.1   LFT  Basename 05/10/12 0953  PROT 7.5  ALBUMIN 3.5  AST 59*  ALT 16  ALKPHOS 52  BILITOT 3.1*  BILIDIR --  IBILI --   PT/INR No results found for this basename: LABPROT:2,INR:2 in the last 72 hours Hepatitis Panel No results found for this basename: HEPBSAG,HCVAB,HEPAIGM,HEPBIGM in the last 72  hours  Studies/Results: No results found.   ASSESSMENT:   Active Problems:  Ulceration of intestine     PLAN:   Slow GIB, in the setting of Sickle Cell disease,  We have scheduled her foe a SB capsule endoscopy this morning- till 4.00 pm, discussed the test with the pt. We are looking for SB ulcerations or AVM's.   LUQ tenderness- ?? Possibly splenic infarcts, her colon at the splenic flexure was normal. She said that Dr dean gave her Dexilant which helped the pain. She is currently on Pantoprazole IV     LOS: 4 days   Lina Sar  05/13/2012, 7:57 AM

## 2012-05-13 NOTE — Progress Notes (Signed)
   CARE MANAGEMENT NOTE 05/13/2012  Patient:  Thoen,Brailee   Account Number:  [id number]  Date Initiated:  05/13/2012  Documentation initiated by:  Jiles Crocker  Subjective/Objective Assessment:   ADMITTED WITH SICKLE CELL CRISIS     Action/Plan:   LIVES AT HOME WITH FAMILY   Anticipated DC Date:  05/19/2012   Anticipated DC Plan:  HOME/SELF CARE      DC Planning Services  CM consult          Status of service:  In process, will continue to follow Medicare Important Message given?  NA - LOS <3 / Initial given by admissions (If response is "NO", the following Medicare IM given date fields will be blank)  Per UR Regulation:  Reviewed for med. necessity/level of care/duration of stay  Comments:  05/13/2012- B Prabhleen Montemayor RN, BSN, MHA

## 2012-05-13 NOTE — Progress Notes (Signed)
Subjective:  Patient complaining of limb pain 7/10. Still  with abdominal pain though decreased. No hematemesis or melena. She is undergoing capsule endoscopy. No other new complaints. Her joint pain is not being well controlled without antiinflammmatory agents.   Allergies  Allergen Reactions  . Ampicillin   . Demerol (Meperidine)    Current Facility-Administered Medications  Medication Dose Route Frequency Provider Last Rate Last Dose  . 0.9 %  sodium chloride infusion   Intravenous Continuous Jessica D. Zehr, PA      . dextrose 5 % and 0.45 % NaCl with KCl 20 mEq/L infusion   Intravenous Continuous Gwenyth Bender, MD 50 mL/hr at 05/13/12 1332    . diphenhydrAMINE (BENADRYL) injection 12.5-25 mg  12.5-25 mg Intravenous Q4H PRN Gwenyth Bender, MD      . folic acid (FOLVITE) tablet 1 mg  1 mg Oral Daily Gwenyth Bender, MD   1 mg at 05/13/12 1055  . HYDROmorphone (DILAUDID) injection 2-4 mg  2-4 mg Intravenous Q2H PRN Gwenyth Bender, MD   2 mg at 05/13/12 1329  . levothyroxine (SYNTHROID, LEVOTHROID) tablet 50 mcg  50 mcg Oral QAC breakfast Gwenyth Bender, MD   50 mcg at 05/13/12 1055  . ondansetron (ZOFRAN) tablet 4 mg  4 mg Oral Q4H PRN Gwenyth Bender, MD       Or  . ondansetron [facility]) injection 4 mg  4 mg Intravenous Q4H PRN Gwenyth Bender, MD   4 mg at 05/11/12 0813  . pantoprazole (PROTONIX) injection 40 mg  40 mg Intravenous Q12H Grayce Sessions, NP   40 mg at 05/13/12 1055    Objective: Blood pressure 145/67, pulse 64, temperature 98.8 F (37.1 C), temperature source Oral, resp. rate 16, height 5\' 11"  (1.803 m), weight 148 lb (67.132 kg), SpO2 96.00%.  WDWN black female in no acute distress. HEENT:no sinus tenderness. NECK:thyroid gland palpable. No nodules. LUNGS:clear to aus.scultation. RU:EAVWUJ S1, S2 wihtout S3. WJX:BJYNWGNFAO and left upper quadrant tenderness. ZHY:QMVHQI PIP joints, and knee NEURO:intact.  Lab results: No results found for this or any previous visit (from the past  48 hour(s)).  Studies/Results: No results found.  Patient Active Problem List  Diagnosis  . Sickle cell anemia  . Sickle cell anemia with pain  . Ulcer - lesion  . Constipation  . Decreased appetite  . Chest pain  . Hypothyroidism  . Hyponatremia  . Arthritis  . Ulceration of intestine    Impression: Abdomnal pain. R/o NSAID induced gastric vs ulcer. EGD negative. Rheumatoid arthritis. Newly diagnosed. She has not seen a Rheumatologist. Sickle cell crisis.   Plan: Finish GI workup. Rheum consult for non NSAID, prednisone alternative ,i.e..methotrexate versus other.   August Saucer, [place] Kuang 05/13/2012 7:40 PM

## 2012-05-14 ENCOUNTER — Encounter (HOSPITAL_COMMUNITY): Payer: Self-pay | Admitting: Internal Medicine

## 2012-05-14 LAB — CBC WITH DIFFERENTIAL/PLATELET
Basophils Absolute: 0.1 10*3/uL (ref 0.0–0.1)
Basophils Relative: 1 % (ref 0–1)
Eosinophils Absolute: 0.3 10*3/uL (ref 0.0–0.7)
Eosinophils Relative: 4 % (ref 0–5)
HCT: 21 % — ABNORMAL LOW (ref 36.0–46.0)
Hemoglobin: 8 g/dL — ABNORMAL LOW (ref 12.0–15.0)
Lymphocytes Relative: 39 % (ref 12–46)
Lymphs Abs: 2.7 10*3/uL (ref 0.7–4.0)
MCH: 29.5 pg (ref 26.0–34.0)
MCHC: 37.7 g/dL — ABNORMAL HIGH (ref 30.0–36.0)
MCV: 77.5 fL — ABNORMAL LOW (ref 78.0–100.0)
Monocytes Absolute: 1.1 10*3/uL — ABNORMAL HIGH (ref 0.1–1.0)
Monocytes Relative: 16 % — ABNORMAL HIGH (ref 3–12)
Neutro Abs: 2.6 10*3/uL (ref 1.7–7.7)
Neutrophils Relative %: 40 % — ABNORMAL LOW (ref 43–77)
Platelets: 170 10*3/uL (ref 150–400)
RBC: 2.71 MIL/uL — ABNORMAL LOW (ref 3.87–5.11)
RDW: 22.3 % — ABNORMAL HIGH (ref 11.5–15.5)
WBC: 6.8 10*3/uL (ref 4.0–10.5)

## 2012-05-14 LAB — COMPREHENSIVE METABOLIC PANEL
ALT: 18 U/L (ref 0–35)
AST: 84 U/L — ABNORMAL HIGH (ref 0–37)
Albumin: 3.3 g/dL — ABNORMAL LOW (ref 3.5–5.2)
Alkaline Phosphatase: 55 U/L (ref 39–117)
BUN: 3 mg/dL — ABNORMAL LOW (ref 6–23)
CO2: 28 mEq/L (ref 19–32)
Calcium: 8.9 mg/dL (ref 8.4–10.5)
Chloride: 98 mEq/L (ref 96–112)
Creatinine, Ser: 0.75 mg/dL (ref 0.50–1.10)
GFR calc Af Amer: >90 mL/min (ref >90)
GFR calc non Af Amer: >90 mL/min (ref >90)
Glucose, Bld: 95 mg/dL (ref 70–99)
Potassium: 3.8 mEq/L (ref 3.5–5.1)
Sodium: 134 mEq/L — ABNORMAL LOW (ref 135–145)
Total Bilirubin: 4.2 mg/dL — ABNORMAL HIGH (ref 0.3–1.2)
Total Protein: 7.2 g/dL (ref 6.0–8.3)

## 2012-05-14 MED ORDER — OXYCODONE-ACETAMINOPHEN 5-325 MG PO TABS
1.0000 | ORAL_TABLET | ORAL | Status: DC | PRN
  Administered 2012-05-14 – 2012-05-16 (×5): 1 via ORAL
  Filled 2012-05-14 (×5): qty 1

## 2012-05-14 NOTE — Progress Notes (Signed)
[facility] Progress Note  Subjective:  Path results from colon biopsies near ulcer showed focal active inflammation with focal erosion; most consistent with ischemic injury, according to report.  WCE performed yesterday.  Complaining of some diffuse abdominal pain, but improved.  Mostly her legs are bothering her.  Objective:  Vital signs in last 24 hours: Temp:  [97.8 F (36.6 C)-98.8 F (37.1 C)] 97.8 F (36.6 C) (10/23 0505) Pulse Rate:  [55-90] 70  (10/23 0505) Resp:  [16-18] 16  (10/23 0505) BP: (118-145)/(63-67) 118/63 mmHg (10/23 0505) SpO2:  [95 %-98 %] 96 % (10/23 0505) Weight:  [151 lb (68.493 kg)] 151 lb (68.493 kg) (10/23 0109) Last BM Date: 05/13/12 General:   Alert, Well-developed, in NAD Heart:  Regular rate and rhythm; no murmurs Pulm:  CTAB.  No W/R/R. Abdomen:  Soft, nondistended. Normal bowel sounds, without guarding, and without rebound.  Mild diffuse TTP.   Extremities:  Without edema. But wound noted on LLE. Neurologic:  Alert and  oriented x4;  grossly normal neurologically. Psych:  Alert and cooperative. Normal mood and affect.  Intake/Output from previous day: 10/22 0701 - 10/23 0700 In: 2340 [P.O.:1140; I.V.:1200] Out: 500 [Urine:500] Intake/Output this shift: Total I/O In: 240 [P.O.:240] Out: -   Lab Results:  Basename 05/13/12 2002  WBC 5.9  HGB 8.3*  HCT 22.1*  PLT 182   BMET  Basename 05/13/12 2002  NA 131*  K 3.9  CL 97  CO2 28  GLUCOSE 98  BUN <3*  CREATININE 0.69  CALCIUM 9.1   LFT  Basename 05/13/12 2002  PROT 7.5  ALBUMIN 3.4*  AST 78*  ALT 18  ALKPHOS 54  BILITOT 3.3*  BILIDIR --  IBILI --   Assessment / Plan: -Anemia and heme positive stools-s/p transfuse with 2 units of PRBC's; Hgb stable.  EGD and colonoscopy 10/21 showed no likely source of bleeding.  WCE performed yesterday 10/22. -Sickle cell disease  -Elevated AST and total bili:  Have been elevated at least since 09/2011 at which time they  were even higher than they are now.  ?Related to sickle cell?  *Continue to monitor Hgb. *Has a follow-up appointment with Dr. Juanda Chance on 06/18/2012 at 10:15 am.  She will be contacted regarding the results of her WCE sometime next week if there is something found or needs to be addressed. *May need to consider ultrasound of her liver to evaluate her bili and AST elevation.    LOS: 5 days   ZEHR, JESSICA D.  05/14/2012, 9:55 AM  Pager number [phone] I have reviewed the above note, examined the patient and agree with plan of treatment. Her AST and bili elevation is not liver related, but is likely a result of  RBC destruction of sickle cells. She may be discharged from GI standpoint.  [facility] Pager # [phone]

## 2012-05-15 ENCOUNTER — Telehealth: Payer: Self-pay | Admitting: *Deleted

## 2012-05-15 LAB — CBC WITH DIFFERENTIAL/PLATELET
Basophils Absolute: 0.1 10*3/uL (ref 0.0–0.1)
Basophils Relative: 1 % (ref 0–1)
Eosinophils Absolute: 0.5 10*3/uL (ref 0.0–0.7)
Eosinophils Relative: 6 % — ABNORMAL HIGH (ref 0–5)
HCT: 23 % — ABNORMAL LOW (ref 36.0–46.0)
Hemoglobin: 8.6 g/dL — ABNORMAL LOW (ref 12.0–15.0)
Lymphocytes Relative: 24 % (ref 12–46)
Lymphs Abs: 1.8 10*3/uL (ref 0.7–4.0)
MCH: 29.5 pg (ref 26.0–34.0)
MCHC: 37.4 g/dL — ABNORMAL HIGH (ref 30.0–36.0)
MCV: 78.8 fL (ref 78.0–100.0)
Monocytes Absolute: 0.9 10*3/uL (ref 0.1–1.0)
Monocytes Relative: 12 % (ref 3–12)
Neutro Abs: 4.3 10*3/uL (ref 1.7–7.7)
Neutrophils Relative %: 57 % (ref 43–77)
Platelets: 214 10*3/uL (ref 150–400)
RBC: 2.92 MIL/uL — ABNORMAL LOW (ref 3.87–5.11)
RDW: 21.8 % — ABNORMAL HIGH (ref 11.5–15.5)
WBC: 7.6 10*3/uL (ref 4.0–10.5)

## 2012-05-15 LAB — COMPREHENSIVE METABOLIC PANEL
ALT: 20 U/L (ref 0–35)
AST: 90 U/L — ABNORMAL HIGH (ref 0–37)
Albumin: 3.5 g/dL (ref 3.5–5.2)
Alkaline Phosphatase: 58 U/L (ref 39–117)
BUN: 4 mg/dL — ABNORMAL LOW (ref 6–23)
CO2: 27 mEq/L (ref 19–32)
Calcium: 9.3 mg/dL (ref 8.4–10.5)
Chloride: 97 mEq/L (ref 96–112)
Creatinine, Ser: 0.75 mg/dL (ref 0.50–1.10)
GFR calc Af Amer: >90 mL/min (ref >90)
GFR calc non Af Amer: >90 mL/min (ref >90)
Glucose, Bld: 81 mg/dL (ref 70–99)
Potassium: 4.1 mEq/L (ref 3.5–5.1)
Sodium: 134 mEq/L — ABNORMAL LOW (ref 135–145)
Total Bilirubin: 3.3 mg/dL — ABNORMAL HIGH (ref 0.3–1.2)
Total Protein: 7.6 g/dL (ref 6.0–8.3)

## 2012-05-15 NOTE — Progress Notes (Signed)
Subjective:  Patient reports she's feeling better. She still has some stomach pain and not as severe. She notes pain in her risk and small joints. She is not on any anti-inflammatory medication at this time. She denies any hematemesis or melena. No new results from her capsule endoscopy.   Allergies  Allergen Reactions  . Ampicillin   . Demerol (Meperidine)    Current Facility-Administered Medications  Medication Dose Route Frequency Provider Last Rate Last Dose  . 0.9 %  sodium chloride infusion   Intravenous Continuous Jessica D. Zehr, PA      . dextrose 5 % and 0.45 % NaCl with KCl 20 mEq/L infusion   Intravenous Continuous Gwenyth Bender, MD 50 mL/hr at 05/15/12 1636    . diphenhydrAMINE (BENADRYL) injection 12.5-25 mg  12.5-25 mg Intravenous Q4H PRN Gwenyth Bender, MD      . folic acid (FOLVITE) tablet 1 mg  1 mg Oral Daily Gwenyth Bender, MD   1 mg at 05/15/12 1036  . HYDROmorphone (DILAUDID) injection 2-4 mg  2-4 mg Intravenous Q2H PRN Gwenyth Bender, MD   2 mg at 05/14/12 0224  . levothyroxine (SYNTHROID, LEVOTHROID) tablet 50 mcg  50 mcg Oral QAC breakfast Gwenyth Bender, MD   50 mcg at 05/15/12 0758  . ondansetron (ZOFRAN) tablet 4 mg  4 mg Oral Q4H PRN Gwenyth Bender, MD       Or  . ondansetron [facility]) injection 4 mg  4 mg Intravenous Q4H PRN Gwenyth Bender, MD   4 mg at 05/14/12 0551  . oxyCODONE-acetaminophen (PERCOCET/ROXICET) 5-325 MG per tablet 1 tablet  1 tablet Oral Q4H PRN Gwenyth Bender, MD   1 tablet at 05/15/12 1727  . pantoprazole (PROTONIX) injection 40 mg  40 mg Intravenous Q12H Grayce Sessions, NP   40 mg at 05/15/12 2255    Objective: Blood pressure 107/62, pulse 72, temperature 98.8 F (37.1 C), temperature source Oral, resp. rate 18, height 6\' 1"  (1.854 m), weight 151 lb (68.493 kg), SpO2 97.00%.  Well-developed well-nourished black female presently in no acute distress. She rates the pain as a 7/10. HEENT:no sinus tenderness. NECK:no enlarged thyroid. LUNGS:clear to  auscultation. No CVA tenderness. RU:EAVWUJ S1, S2 without S3. ABD:no epigastric tenderness. No rebound tenderness. WJX:BJYNWGNFAO in the knees and ankles. Decreased tenderness and wrists. Not as much swelling or warmth. NEURO:intact.  Lab results: Results for orders placed during the hospital encounter of 05/09/12 (from the past 48 hour(s))  CBC WITH DIFFERENTIAL     Status: Abnormal   Collection Time   05/14/12  4:46 PM      Component Value Range Comment   WBC 6.8  4.0 - 10.5 K/uL WHITE COUNT CONFIRMED ON SMEAR   RBC 2.71 (*) 3.87 - 5.11 MIL/uL    Hemoglobin 8.0 (*) 12.0 - 15.0 g/dL    HCT 13.0 (*) 86.5 - 46.0 %    MCV 77.5 (*) 78.0 - 100.0 fL    MCH 29.5  26.0 - 34.0 pg    MCHC 37.7 (*) 30.0 - 36.0 g/dL    RDW 78.4 (*) 69.6 - 15.5 %    Platelets 170  150 - 400 K/uL    Neutrophils Relative 40 (*) 43 - 77 %    Lymphocytes Relative 39  12 - 46 %    Monocytes Relative 16 (*) 3 - 12 %    Eosinophils Relative 4  0 - 5 %    Basophils Relative 1  0 - 1 %    RBC Morphology RARE NRBCs      Smear Review LARGE PLATELETS PRESENT      Neutro Abs 2.6  1.7 - 7.7 K/uL    Lymphs Abs 2.7  0.7 - 4.0 K/uL    Monocytes Absolute 1.1 (*) 0.1 - 1.0 K/uL    Eosinophils Absolute 0.3  0.0 - 0.7 K/uL    Basophils Absolute 0.1  0.0 - 0.1 K/uL   COMPREHENSIVE METABOLIC PANEL     Status: Abnormal   Collection Time   05/14/12  4:46 PM      Component Value Range Comment   Sodium 134 (*) 135 - 145 mEq/L    Potassium 3.8  3.5 - 5.1 mEq/L    Chloride 98  96 - 112 mEq/L    CO2 28  19 - 32 mEq/L    Glucose, Bld 95  70 - 99 mg/dL    BUN 3 (*) 6 - 23 mg/dL    Creatinine, Ser 1.61  0.50 - 1.10 mg/dL    Calcium 8.9  8.4 - 09.6 mg/dL    Total Protein 7.2  6.0 - 8.3 g/dL    Albumin 3.3 (*) 3.5 - 5.2 g/dL    AST 84 (*) 0 - 37 U/L    ALT 18  0 - 35 U/L    Alkaline Phosphatase 55  39 - 117 U/L    Total Bilirubin 4.2 (*) 0.3 - 1.2 mg/dL    GFR calc non Af Amer >90  >90 mL/min    GFR calc Af Amer >90  >90 mL/min    CBC WITH DIFFERENTIAL     Status: Abnormal   Collection Time   05/15/12 10:53 AM      Component Value Range Comment   WBC 7.6  4.0 - 10.5 K/uL    RBC 2.92 (*) 3.87 - 5.11 MIL/uL    Hemoglobin 8.6 (*) 12.0 - 15.0 g/dL    HCT 04.5 (*) 40.9 - 46.0 %    MCV 78.8  78.0 - 100.0 fL    MCH 29.5  26.0 - 34.0 pg    MCHC 37.4 (*) 30.0 - 36.0 g/dL SICKLE CELLS   RDW 81.1 (*) 11.5 - 15.5 %    Platelets 214  150 - 400 K/uL    Neutrophils Relative 57  43 - 77 %    Lymphocytes Relative 24  12 - 46 %    Monocytes Relative 12  3 - 12 %    Eosinophils Relative 6 (*) 0 - 5 %    Basophils Relative 1  0 - 1 %    Neutro Abs 4.3  1.7 - 7.7 K/uL    Lymphs Abs 1.8  0.7 - 4.0 K/uL    Monocytes Absolute 0.9  0.1 - 1.0 K/uL    Eosinophils Absolute 0.5  0.0 - 0.7 K/uL    Basophils Absolute 0.1  0.0 - 0.1 K/uL    RBC Morphology POLYCHROMASIA PRESENT     COMPREHENSIVE METABOLIC PANEL     Status: Abnormal   Collection Time   05/15/12 10:53 AM      Component Value Range Comment   Sodium 134 (*) 135 - 145 mEq/L    Potassium 4.1  3.5 - 5.1 mEq/L    Chloride 97  96 - 112 mEq/L    CO2 27  19 - 32 mEq/L    Glucose, Bld 81  70 - 99 mg/dL    BUN 4 (*) 6 -  23 mg/dL    Creatinine, Ser 4.09  0.50 - 1.10 mg/dL    Calcium 9.3  8.4 - 81.1 mg/dL    Total Protein 7.6  6.0 - 8.3 g/dL    Albumin 3.5  3.5 - 5.2 g/dL    AST 90 (*) 0 - 37 U/L    ALT 20  0 - 35 U/L    Alkaline Phosphatase 58  39 - 117 U/L    Total Bilirubin 3.3 (*) 0.3 - 1.2 mg/dL    GFR calc non Af Amer >90  >90 mL/min    GFR calc Af Amer >90  >90 mL/min     Studies/Results: No results found.  Patient Active Problem List  Diagnosis  . Sickle cell anemia  . Sickle cell anemia with pain  . Ulcer - lesion  . Constipation  . Decreased appetite  . Chest pain  . Hypothyroidism  . Hyponatremia  . Arthritis  . Ulceration of intestine    Impression: Sickle cell crisis. Active hemolysis continues but improving. Rule out NSAID gastritis. GI loss  questional source. Rheumatoid arthritis presently being untreated. Gradually increasing hemoglobin.   Plan: Continue present therapy. Followup CBC, CMET. Possible discharge tomorrow outpatient followup.   August Saucer, Cavon Nicolls 05/15/2012 11:14 PM

## 2012-05-15 NOTE — Telephone Encounter (Signed)
Message copied by Richardson Chiquito on Thu May 15, 2012  2:27 PM ------      Message from: Hart Carwin      Created: Wed May 14, 2012 10:28 PM      Regarding: small bowl capsule endoscopy       Please call pt with negative SBCE, no evidence of bleeding from the small bowl. She has an appointment with me in about 1 month.      ----- Message -----         From: Sammuel Cooper, PA         Sent: 05/14/2012   5:02 PM           To: Hart Carwin, MD            Verlee Monte- I looked at the capsule on [facility]-  It is negative .There was a little oozing of blood in colon-just red streaks ,probably from where you bx'd the IC valve.

## 2012-05-15 NOTE — Telephone Encounter (Signed)
Patient's phone number is disconnected. We will send letter.

## 2012-05-16 LAB — CBC WITH DIFFERENTIAL/PLATELET
Basophils Absolute: 0.1 10*3/uL (ref 0.0–0.1)
Basophils Relative: 1 % (ref 0–1)
Eosinophils Absolute: 0.5 10*3/uL (ref 0.0–0.7)
Eosinophils Relative: 7 % — ABNORMAL HIGH (ref 0–5)
HCT: 21.2 % — ABNORMAL LOW (ref 36.0–46.0)
Hemoglobin: 8 g/dL — ABNORMAL LOW (ref 12.0–15.0)
Lymphocytes Relative: 25 % (ref 12–46)
Lymphs Abs: 1.9 10*3/uL (ref 0.7–4.0)
MCH: 29.9 pg (ref 26.0–34.0)
MCHC: 37.7 g/dL — ABNORMAL HIGH (ref 30.0–36.0)
MCV: 79.1 fL (ref 78.0–100.0)
Monocytes Absolute: 0.9 10*3/uL (ref 0.1–1.0)
Monocytes Relative: 12 % (ref 3–12)
Neutro Abs: 4.1 10*3/uL (ref 1.7–7.7)
Neutrophils Relative %: 55 % (ref 43–77)
Platelets: 198 10*3/uL (ref 150–400)
RBC: 2.68 MIL/uL — ABNORMAL LOW (ref 3.87–5.11)
RDW: 22.5 % — ABNORMAL HIGH (ref 11.5–15.5)
WBC: 7.5 10*3/uL (ref 4.0–10.5)

## 2012-05-16 LAB — COMPREHENSIVE METABOLIC PANEL
ALT: 21 U/L (ref 0–35)
AST: 85 U/L — ABNORMAL HIGH (ref 0–37)
Albumin: 3.4 g/dL — ABNORMAL LOW (ref 3.5–5.2)
Alkaline Phosphatase: 61 U/L (ref 39–117)
BUN: 5 mg/dL — ABNORMAL LOW (ref 6–23)
CO2: 26 mEq/L (ref 19–32)
Calcium: 9.4 mg/dL (ref 8.4–10.5)
Chloride: 99 mEq/L (ref 96–112)
Creatinine, Ser: 0.7 mg/dL (ref 0.50–1.10)
GFR calc Af Amer: >90 mL/min (ref >90)
GFR calc non Af Amer: >90 mL/min (ref >90)
Glucose, Bld: 91 mg/dL (ref 70–99)
Potassium: 4 mEq/L (ref 3.5–5.1)
Sodium: 133 mEq/L — ABNORMAL LOW (ref 135–145)
Total Bilirubin: 2.9 mg/dL — ABNORMAL HIGH (ref 0.3–1.2)
Total Protein: 7.6 g/dL (ref 6.0–8.3)

## 2012-05-16 MED ORDER — DEXLANSOPRAZOLE 60 MG PO CPDR: 60.0000 mg | DELAYED_RELEASE_CAPSULE | Freq: Every day | ORAL | Status: DC

## 2012-05-16 MED ORDER — OXYCODONE-ACETAMINOPHEN 5-325 MG PO TABS: 1.0000 | ORAL_TABLET | ORAL | Status: DC | PRN

## 2012-05-16 NOTE — Discharge Summary (Signed)
Physician Discharge Summary  Patient ID: [name] MRN: [id number] DOB/AGE: 52-Nov-1962 52 y.o.  Admit date: 05/09/2012 Discharge date: 05/16/2012   Discharge Diagnoses:  Active Problems:  Ulceration of intestine  Sickle cell crisis. Acute ulcerations and GI tract Shallow ulcer at ileocecal valve. Rheumatoid arthritis. Symptomatic anemia.  Discharged Condition: Improved  Operations/Procedues: Transfusion of 3 units of packed RBCs. Endoscopy. Colonoscopy.   Hospital Course: Patient was admitted to telemetry for closer observation. She was started on IV fluids. She was noted to have a hemoglobin of 4. 3. Stool guaiacs were ordered as well. Patient was given IV Protonix. She was transfused a total of 3 units of packed RBCs as well. Patient was seen by Dr. Elnoria Howard of gastroenterology. Patient was started on a prep for and on colonoscopy. This was not done anatomic fashion. Further evaluation was completed by Dr. Dickie La. Endoscopy there was no acute ulcerations seen. On colonoscopy patient had a small ulcer on the ileocecal valve with fresh blood. No other lesions were seen. Patient was continued on PPI therapy. She also underwent a capsule endoscopy to evaluate her small intestines. No other lesions were found. Her hemoglobin remained stable. Patient's NSAIDs and steroids were held. She had no further abdominal pain on the PPI therapy. She was subsequently discharged home with further outpatient management.  Significant Diagnostic Studies: Endoscopy and colonoscopy.  Disposition: 01-Home or Self Care  Discharge Orders    Future Appointments: Provider: Department: Dept Phone: Center:   06/18/2012 10:15 AM Hart Carwin, MD [facility] [phone] [facility]     Future Orders Please Complete By Expires   Diet - low sodium heart healthy      Increase activity slowly      No wound care      Call MD for:  severe uncontrolled pain      Discharge instructions      Comments:   Follow up with primary care physician in two weeks.       Medication List     As of 05/16/2012  4:57 PM    TAKE these medications         CALTRATE 600 PLUS-VIT D PO   Take 1 tablet by mouth daily.      dexlansoprazole 60 MG capsule   Commonly known as: DEXILANT   Take 1 capsule (60 mg total) by mouth daily.      folic acid 800 MCG tablet   Commonly known as: FOLVITE   Take 0.5 tablets (400 mcg total) by mouth daily.      levothyroxine 50 MCG tablet   Commonly known as: SYNTHROID, LEVOTHROID   Take 1 tablet (50 mcg total) by mouth daily before breakfast.      oxyCODONE-acetaminophen 5-325 MG per tablet   Commonly known as: PERCOCET/ROXICET   Take 1 tablet by mouth every 4 (four) hours as needed.      promethazine 25 MG tablet   Commonly known as: PHENERGAN   Take 25 mg by mouth every 4 (four) hours as needed. For nausea      promethazine 25 MG tablet   Commonly known as: PHENERGAN   Take 1 tablet (25 mg total) by mouth every 6 (six) hours as needed for nausea.      VITAMIN C PO   Take 500 mg elemental calcium/kg/hr by mouth daily.           Follow-up Information    Follow up with Lina Sar, MD. On 06/18/2012. (10:15 am)  Contact information:   [address] [place] [address] [phone]          Signed: August Saucer Muath Hallam 05/16/2012, 4:57 PM  Time spent on discharge planning greater than 35 minutes.

## 2012-05-16 NOTE — Progress Notes (Signed)
Patient discharged home. Discharge instructions given and explained to patient and she verbalized understanding. Denies any pain/distress. No open wounds/ulcer.

## 2012-05-19 ENCOUNTER — Encounter: Payer: Self-pay | Admitting: Internal Medicine

## 2012-05-23 ENCOUNTER — Encounter: Payer: Self-pay | Admitting: *Deleted

## 2012-06-18 ENCOUNTER — Ambulatory Visit: Payer: Medicare Other | Admitting: Internal Medicine

## 2012-08-14 ENCOUNTER — Telehealth: Payer: Self-pay | Admitting: Internal Medicine

## 2012-08-14 NOTE — Telephone Encounter (Signed)
Message copied by Arna Snipe on Thu Aug 14, 2012  4:15 PM ------      Message from: Richardson Chiquito      Created: Wed Jun 18, 2012 12:45 PM                   ----- Message -----         From: Hart Carwin, MD         Sent: 06/18/2012  12:27 PM           To: Richardson Chiquito, CMA            Yes            ----- Message -----         From: Richardson Chiquito, CMA         Sent: 06/18/2012  11:16 AM           To: Hart Carwin, MD            Patient no showed appointment with Dr Juanda Chance on 06/18/12. Dr Juanda Chance, do you want to charge no show fee?

## 2012-12-01 ENCOUNTER — Other Ambulatory Visit: Payer: Self-pay | Admitting: Internal Medicine

## 2012-12-01 DIAGNOSIS — Z1231 Encounter for screening mammogram for malignant neoplasm of breast: Secondary | ICD-10-CM

## 2012-12-02 ENCOUNTER — Other Ambulatory Visit: Payer: Self-pay | Admitting: Internal Medicine

## 2012-12-02 ENCOUNTER — Telehealth: Payer: Self-pay | Admitting: *Deleted

## 2012-12-02 LAB — LIPID PANEL
Cholesterol: 157 mg/dL (ref 0–200)
HDL: 44 mg/dL (ref >39)
LDL Cholesterol: 97 mg/dL (ref 0–99)
Total CHOL/HDL Ratio: 3.6 Ratio
Triglycerides: 80 mg/dL (ref <150)
VLDL: 16 mg/dL (ref 0–40)

## 2012-12-02 LAB — COMPREHENSIVE METABOLIC PANEL
ALT: 23 U/L (ref 0–35)
AST: 54 U/L — ABNORMAL HIGH (ref 0–37)
Albumin: 4.2 g/dL (ref 3.5–5.2)
Alkaline Phosphatase: 71 U/L (ref 39–117)
BUN: 7 mg/dL (ref 6–23)
CO2: 26 mEq/L (ref 19–32)
Calcium: 9.7 mg/dL (ref 8.4–10.5)
Chloride: 103 mEq/L (ref 96–112)
Creat: 0.64 mg/dL (ref 0.50–1.10)
Glucose, Bld: 111 mg/dL — ABNORMAL HIGH (ref 70–99)
Potassium: 4 mEq/L (ref 3.5–5.3)
Sodium: 136 mEq/L (ref 135–145)
Total Bilirubin: 3.9 mg/dL — ABNORMAL HIGH (ref 0.3–1.2)
Total Protein: 7.4 g/dL (ref 6.0–8.3)

## 2012-12-02 LAB — IRON AND TIBC
%SAT: 25 % (ref 20–55)
Iron: 86 ug/dL (ref 42–145)
TIBC: 341 ug/dL (ref 250–470)
UIBC: 255 ug/dL (ref 125–400)

## 2012-12-02 LAB — FERRITIN: Ferritin: 112 ng/mL (ref 10–291)

## 2012-12-02 LAB — FOLATE: Folate: >20 ng/mL

## 2012-12-02 LAB — TSH: TSH: 4.01 u[IU]/mL (ref 0.350–4.500)

## 2012-12-02 LAB — MAGNESIUM: Magnesium: 1.9 mg/dL (ref 1.5–2.5)

## 2012-12-02 NOTE — Telephone Encounter (Signed)
Betsy @ [facility] called in regards to pts order for OT, she states that pts insurance will not allow for just OT, she would need an additional order for either PT  or for a Nursing assessment of all pt needs.  Also states that there OT staff are currently booked and unable to accept new patients at this time and suggest we resend orders to a different agency.

## 2012-12-03 ENCOUNTER — Other Ambulatory Visit (HOSPITAL_COMMUNITY): Payer: Self-pay | Admitting: Radiology

## 2012-12-03 ENCOUNTER — Ambulatory Visit (HOSPITAL_COMMUNITY): Payer: Medicare Other | Attending: Cardiovascular Disease | Admitting: Radiology

## 2012-12-03 DIAGNOSIS — D571 Sickle-cell disease without crisis: Secondary | ICD-10-CM | POA: Insufficient documentation

## 2012-12-03 DIAGNOSIS — R0989 Other specified symptoms and signs involving the circulatory and respiratory systems: Secondary | ICD-10-CM

## 2012-12-03 DIAGNOSIS — I059 Rheumatic mitral valve disease, unspecified: Secondary | ICD-10-CM | POA: Insufficient documentation

## 2012-12-03 DIAGNOSIS — R079 Chest pain, unspecified: Secondary | ICD-10-CM

## 2012-12-03 DIAGNOSIS — R0609 Other forms of dyspnea: Secondary | ICD-10-CM | POA: Insufficient documentation

## 2012-12-03 LAB — CBC WITH DIFFERENTIAL/PLATELET
Basophils Absolute: 0.1 10*3/uL (ref 0.0–0.1)
Basophils Relative: 0 % (ref 0–1)
Eosinophils Absolute: 0.1 10*3/uL (ref 0.0–0.7)
Eosinophils Relative: 1 % (ref 0–5)
HCT: 18.4 % — ABNORMAL LOW (ref 36.0–46.0)
Hemoglobin: 6.3 g/dL — CL (ref 12.0–15.0)
Lymphocytes Relative: 20 % (ref 12–46)
Lymphs Abs: 2.4 10*3/uL (ref 0.7–4.0)
MCH: 27.2 pg (ref 26.0–34.0)
MCHC: 34.2 g/dL (ref 30.0–36.0)
MCV: 79.3 fL (ref 78.0–100.0)
Monocytes Absolute: 0.8 10*3/uL (ref 0.1–1.0)
Monocytes Relative: 7 % (ref 3–12)
Neutro Abs: 8.6 10*3/uL — ABNORMAL HIGH (ref 1.7–7.7)
Neutrophils Relative %: 72 % (ref 43–77)
Platelets: 272 10*3/uL (ref 150–400)
RBC: 2.32 MIL/uL — ABNORMAL LOW (ref 3.87–5.11)
RDW: 23.5 % — ABNORMAL HIGH (ref 11.5–15.5)
WBC: 11.9 10*3/uL — ABNORMAL HIGH (ref 4.0–10.5)

## 2012-12-03 LAB — RETICULOCYTES
ABS Retic: 468.6 10*3/uL — ABNORMAL HIGH (ref 19.0–186.0)
RBC.: 2.32 MIL/uL — ABNORMAL LOW (ref 3.87–5.11)
Retic Ct Pct: 20.2 % — ABNORMAL HIGH (ref 0.4–2.3)

## 2012-12-03 LAB — VITAMIN D 25 HYDROXY (VIT D DEFICIENCY, FRACTURES): Vit D, 25-Hydroxy: 10 ng/mL — ABNORMAL LOW (ref 30–89)

## 2012-12-03 LAB — BRAIN NATRIURETIC PEPTIDE: Brain Natriuretic Peptide: 42.1 pg/mL (ref 0.0–100.0)

## 2012-12-03 NOTE — Progress Notes (Signed)
Echocardiogram performed.  

## 2012-12-04 ENCOUNTER — Encounter (HOSPITAL_COMMUNITY): Payer: Self-pay | Admitting: Internal Medicine

## 2012-12-04 ENCOUNTER — Encounter: Payer: Self-pay | Admitting: Internal Medicine

## 2012-12-05 ENCOUNTER — Telehealth: Payer: Self-pay | Admitting: *Deleted

## 2012-12-05 LAB — HEMOGLOBINOPATHY EVALUATION
Hemoglobin Other: 0 %
Hgb A2 Quant: 3.9 % — ABNORMAL HIGH (ref 2.2–3.2)
Hgb A: 0 % — ABNORMAL LOW (ref 96.8–97.8)
Hgb F Quant: 2.1 % — ABNORMAL HIGH (ref 0.0–2.0)
Hgb S Quant: 94 % — ABNORMAL HIGH

## 2012-12-05 NOTE — Telephone Encounter (Signed)
[facility] @ Dr. Deirdre Evener office called to inform office that pt no showed for her eye appointment on 12/04/2012.

## 2012-12-12 ENCOUNTER — Telehealth: Payer: Self-pay | Admitting: Internal Medicine

## 2012-12-12 DIAGNOSIS — L089 Local infection of the skin and subcutaneous tissue, unspecified: Secondary | ICD-10-CM | POA: Insufficient documentation

## 2012-12-12 DIAGNOSIS — W57XXXA Bitten or stung by nonvenomous insect and other nonvenomous arthropods, initial encounter: Secondary | ICD-10-CM | POA: Insufficient documentation

## 2012-12-12 MED ORDER — CEPHALEXIN 500 MG PO CAPS: 500.0000 mg | ORAL_CAPSULE | Freq: Two times a day (BID) | ORAL | Status: DC

## 2012-12-12 MED ORDER — HYDROCORTISONE 0.5 % EX CREA: TOPICAL_CREAM | Freq: Two times a day (BID) | CUTANEOUS | Status: DC

## 2012-12-12 NOTE — Telephone Encounter (Signed)
Pt was previously seen in the office and assessed to have bug bites. She was advised to take benadryl and Topical hydrocortisone OTC. Pt called today and reported that she had a bed bug infestation in her furniture. Upon questioning she reports that she has been scratching and has a lesion on the RLE which appears red, warm and is weeping fluid.   Based on the patient's report and my observation of skin at her last visit, I am concerned about a superinfection. I will prescribe Keflex for 10 days and HCT cream. I have instructed patient to take Benadryl for itching. She is to call and schedule an appointment to  follow up in the office on Tuesday.  Kaide Gage A.

## 2012-12-16 ENCOUNTER — Ambulatory Visit (INDEPENDENT_AMBULATORY_CARE_PROVIDER_SITE_OTHER): Payer: Medicare Other | Admitting: Internal Medicine

## 2012-12-16 ENCOUNTER — Ambulatory Visit: Payer: Medicare Other | Admitting: Internal Medicine

## 2012-12-16 ENCOUNTER — Encounter: Payer: Self-pay | Admitting: Internal Medicine

## 2012-12-16 ENCOUNTER — Telehealth: Payer: Self-pay | Admitting: *Deleted

## 2012-12-16 ENCOUNTER — Ambulatory Visit: Payer: Medicare Other | Admitting: Primary Care

## 2012-12-16 VITALS — BP 136/85 | HR 78 | Temp 98.4°F | Ht 71.0 in | Wt 148.0 lb

## 2012-12-16 DIAGNOSIS — E039 Hypothyroidism, unspecified: Secondary | ICD-10-CM | POA: Insufficient documentation

## 2012-12-16 DIAGNOSIS — D571 Sickle-cell disease without crisis: Secondary | ICD-10-CM

## 2012-12-16 DIAGNOSIS — L0291 Cutaneous abscess, unspecified: Secondary | ICD-10-CM

## 2012-12-16 DIAGNOSIS — M069 Rheumatoid arthritis, unspecified: Secondary | ICD-10-CM | POA: Insufficient documentation

## 2012-12-16 DIAGNOSIS — L0390 Cellulitis, unspecified: Secondary | ICD-10-CM

## 2012-12-16 DIAGNOSIS — W57XXXA Bitten or stung by nonvenomous insect and other nonvenomous arthropods, initial encounter: Secondary | ICD-10-CM

## 2012-12-16 DIAGNOSIS — L089 Local infection of the skin and subcutaneous tissue, unspecified: Secondary | ICD-10-CM

## 2012-12-16 MED ORDER — PREDNISONE 10 MG PO TABS: 10.0000 mg | ORAL_TABLET | Freq: Every day | ORAL | Status: DC

## 2012-12-16 MED ORDER — HYDROCORTISONE 0.5 % EX CREA: TOPICAL_CREAM | Freq: Two times a day (BID) | CUTANEOUS | Status: DC

## 2012-12-16 NOTE — Progress Notes (Signed)
Subjective:     Patient ID: [name], female   DOB: [birth year], 52 y.o.   MRN: [id number]  Anemia  Arthritis Associated symptoms include rash.  : [name] is here for reevaluation of the bites on the skin. The patient last week and informed that she did and he had bed bugs in her home which is my initial diagnosis to her. She indicated that she had some warmth redness and the pin of the lesions. I call a prescription for Keflex which she has not yet picked up and she's here today for me to reevaluate the lesions. She does have some areas of cellulitis on the arms however that contains a very small areas.  The patient also states that she's been having some pain in her wrist associated with rheumatoid arthritis. She's been continuing on prednisone 10 mg but is almost out of her prescription. Our last visit we discussed that she would be seen by a rheumatologist as she had a significantly elevated CCP antibody in May of 2013. Although her radiologic studies were not diagnostic of rheumatoid arthritis is certainly represent early stages.   Patient is also here to followup on her laboratory studies that were performed on her last visit including lab studies related to her sickle cell anemia as well as hypothyroidism.   Review of Systems  Constitutional: Negative.   HENT: Negative.   Eyes: Negative.   Respiratory: Negative.   Cardiovascular: Negative.   Gastrointestinal: Negative.   Endocrine: Negative.   Genitourinary: Negative.   Musculoskeletal: Positive for arthritis.       Left wrist pain  Skin: Positive for rash.  Allergic/Immunologic: Negative.   Neurological: Negative.   Hematological: Negative.   Psychiatric/Behavioral: Negative.        Objective:   Physical Exam  Constitutional: She is oriented to person, place, and time.  Underweight  HENT:  Head: Normocephalic and atraumatic.  Eyes: Conjunctivae and EOM are normal. Pupils are equal, round, and reactive to light. No  scleral icterus.  Neck: Normal range of motion. Neck supple. No JVD present. No thyromegaly present.  Cardiovascular: Normal rate and regular rhythm.  Exam reveals no gallop and no friction rub.   No murmur heard. Pulmonary/Chest: Effort normal and breath sounds normal.  Abdominal: Soft. Bowel sounds are normal.  Musculoskeletal: Normal range of motion.  Lymphadenopathy:    She has no cervical adenopathy.  Neurological: She is alert and oriented to person, place, and time.  Skin: Rash noted.  Psychiatric: She has a normal mood and affect. Her behavior is normal. Judgment and thought content normal.       Assessment:     1. Cellulitis: Patient has a superinfection of the bug bites leading to cellulitis. She's been issued a prescription for Keflex. She will followup with nurse practitioner at her next appointment to assess progress with regard to the cellulitis  2. Skin rash/insect bite: Patient has a pattern of rash is consistent with insect bites. She has found that she had her house. She also appears to have developed an infection of the rash and has been issued a prescription for Keflex which she should start taking today. I've also given the patient some hydrocortisone for weaker strength to apply for the itching. She'll followup with the nurse practitioner at her next visit by Marcelino Duster she should complete her antibiotic course. Patient has also been instructed to take oatmeal baths to help with the itching.  3.  Anemia: The patient has anemia without evidence of  hemolysis. This would indicate either blood loss from other areas or the kinetics of decreased production. Given the fact the patient has a low ferritin in spite of having had multiple transfusions I am concerned she may be having blood loss leading to iron deficiency. Alternatively the patient could have bone marrow failure leading to decreased production. I will repeat her hemoglobin as well as a reticulocyte count today. If she  continues to be low then we'll consider transfusing her if she is symptomatic. These low hemoglobins however prohibit use of hydroxyurea in a patient with a high percentage of hemoglobin S.  4. Rheumatoid arthritis: The patient had a CCP last year which was significantly elevated which would point to a diagnosis of rheumatoid arthritis although her radiological studies were not compatible with active disease. She has been on a stable dose of prednisone 10 mg daily. She is being referred to rheumatology but has not yet had the appointment scheduled. I will asked the nurse to follow up with the referral as patient does need to see a rheumatologist to further elucidate her disease and initiate appropriate treatment.  5. Hypothyroidism reviewed her TSH which was found within normal limits. Patient is continued on usual dose of Synthroid.    Plan:     See above  Return to clinic for appointment as scheduled. Labs: CBC with differential, reticulocyte. Referrals: We will followup on her referral to rheumatology.    Greater than 50% of the visit spent in discussion regarding sickle cell disease, but bites and cellulitis and anemia.

## 2012-12-16 NOTE — Telephone Encounter (Signed)
Pt called back, pt came in for her appt today.

## 2012-12-16 NOTE — Telephone Encounter (Signed)
Informed Dr. Ashley Royalty that pt canceled appt today with answering service and stated she would call back to r/s.  Per Dr. Ashley Royalty pt needs to be seen today.  LMOV for pt to call office to come in for appt.

## 2012-12-17 ENCOUNTER — Other Ambulatory Visit: Payer: Medicare Other

## 2012-12-17 ENCOUNTER — Telehealth: Payer: Self-pay | Admitting: *Deleted

## 2012-12-17 ENCOUNTER — Ambulatory Visit: Payer: Medicare Other | Admitting: Internal Medicine

## 2012-12-17 NOTE — Telephone Encounter (Signed)
Pt is scheduled to see Dr. Dierdre Forth @ [facility] on 12/24/12 @ 2:00.  Pt given appt info, address & phone # [phone] [place], [phone]).

## 2012-12-18 LAB — CBC WITH DIFFERENTIAL/PLATELET
Basophils Absolute: 0 10*3/uL (ref 0.0–0.1)
Basophils Relative: 0 % (ref 0–1)
Eosinophils Absolute: 0 10*3/uL (ref 0.0–0.7)
Eosinophils Relative: 0 % (ref 0–5)
HCT: 17.7 % — ABNORMAL LOW (ref 36.0–46.0)
Hemoglobin: 6.4 g/dL — CL (ref 12.0–15.0)
Lymphocytes Relative: 24 % (ref 12–46)
Lymphs Abs: 3.7 10*3/uL (ref 0.7–4.0)
MCH: 28.1 pg (ref 26.0–34.0)
MCHC: 36.2 g/dL — ABNORMAL HIGH (ref 30.0–36.0)
MCV: 77.6 fL — ABNORMAL LOW (ref 78.0–100.0)
Monocytes Absolute: 1.2 10*3/uL — ABNORMAL HIGH (ref 0.1–1.0)
Monocytes Relative: 8 % (ref 3–12)
Neutro Abs: 10.8 10*3/uL — ABNORMAL HIGH (ref 1.7–7.7)
Neutrophils Relative %: 68 % (ref 43–77)
Platelets: 240 10*3/uL (ref 150–400)
RBC: 2.28 MIL/uL — ABNORMAL LOW (ref 3.87–5.11)
RDW: 25 % — ABNORMAL HIGH (ref 11.5–15.5)
WBC: 13.3 10*3/uL — ABNORMAL HIGH (ref 4.0–10.5)

## 2012-12-18 LAB — RETICULOCYTES
ABS Retic: 440 10*3/uL — ABNORMAL HIGH (ref 19.0–186.0)
RBC.: 2.28 MIL/uL — ABNORMAL LOW (ref 3.87–5.11)
Retic Ct Pct: 19.3 % — ABNORMAL HIGH (ref 0.4–2.3)

## 2012-12-19 ENCOUNTER — Encounter (INDEPENDENT_AMBULATORY_CARE_PROVIDER_SITE_OTHER): Payer: Medicare Other | Admitting: Ophthalmology

## 2012-12-19 DIAGNOSIS — H3520 Other non-diabetic proliferative retinopathy, unspecified eye: Secondary | ICD-10-CM

## 2012-12-19 DIAGNOSIS — H35059 Retinal neovascularization, unspecified, unspecified eye: Secondary | ICD-10-CM

## 2012-12-19 DIAGNOSIS — H43819 Vitreous degeneration, unspecified eye: Secondary | ICD-10-CM

## 2012-12-24 ENCOUNTER — Telehealth: Payer: Self-pay | Admitting: *Deleted

## 2012-12-24 NOTE — Telephone Encounter (Signed)
Per Dr. Shawnee Knapp office (Rheumatologist) pt no called/no showed for her appt today.  Dr. Ashley Royalty has been informed of this.

## 2012-12-25 ENCOUNTER — Other Ambulatory Visit (INDEPENDENT_AMBULATORY_CARE_PROVIDER_SITE_OTHER): Payer: Medicare Other | Admitting: Ophthalmology

## 2012-12-31 ENCOUNTER — Ambulatory Visit: Payer: Medicare Other

## 2013-01-02 ENCOUNTER — Ambulatory Visit
Admission: RE | Admit: 2013-01-02 | Discharge: 2013-01-02 | Disposition: A | Discharge status: Home / Self Care | Payer: Medicare Other | Source: Ambulatory Visit | Attending: Internal Medicine | Admitting: Internal Medicine

## 2013-01-02 DIAGNOSIS — Z1231 Encounter for screening mammogram for malignant neoplasm of breast: Secondary | ICD-10-CM

## 2013-01-07 ENCOUNTER — Other Ambulatory Visit (INDEPENDENT_AMBULATORY_CARE_PROVIDER_SITE_OTHER): Payer: Medicare Other | Admitting: Ophthalmology

## 2013-01-07 NOTE — Telephone Encounter (Signed)
Spoke with [name] regarding OT follow.  Pt was confused and had no idea that she was supposed to see anyone for therapy.  RN reviewed the previous office note and there was mention of a sore wrist.  Pt states that yes her wrist was still sore, but she was taking prednisione for that.  RN asked if she thought that the patient would benefit from some therapy, and she stated no that she did not think that it would help.  Will notify Dr Ashley Royalty.

## 2013-01-07 NOTE — Telephone Encounter (Signed)
Lindy,  Please call [name] and find out if she followed up with out patient OT. If not we need to place an order for out patient OT. However, please check with me first to see if she still needs it.

## 2013-01-07 NOTE — Telephone Encounter (Signed)
Attempted to call pt at home and on cell phone regarding her OT followup.  Left message at home phone to call us back.  Will try again later today.

## 2013-01-16 ENCOUNTER — Ambulatory Visit (HOSPITAL_COMMUNITY)
Admission: AD | Admit: 2013-01-16 | Discharge: 2013-01-16 | Disposition: A | Discharge status: Home / Self Care | Payer: Medicare Other | Source: Ambulatory Visit | Attending: Internal Medicine | Admitting: Internal Medicine

## 2013-01-16 ENCOUNTER — Encounter: Payer: Self-pay | Admitting: Primary Care

## 2013-01-16 ENCOUNTER — Ambulatory Visit (INDEPENDENT_AMBULATORY_CARE_PROVIDER_SITE_OTHER): Payer: Medicare Other | Admitting: Primary Care

## 2013-01-16 VITALS — BP 126/71 | HR 66 | Temp 98.3°F | Resp 18 | Ht 71.0 in | Wt 159.0 lb

## 2013-01-16 DIAGNOSIS — D649 Anemia, unspecified: Secondary | ICD-10-CM

## 2013-01-16 DIAGNOSIS — D571 Sickle-cell disease without crisis: Secondary | ICD-10-CM | POA: Insufficient documentation

## 2013-01-16 LAB — CBC WITH DIFFERENTIAL/PLATELET
Band Neutrophils: 0 % (ref 0–10)
Basophils Absolute: 0 10*3/uL (ref 0.0–0.1)
Basophils Relative: 0 % (ref 0–1)
Blasts: 0 %
Eosinophils Absolute: 0.5 10*3/uL (ref 0.0–0.7)
Eosinophils Relative: 5 % (ref 0–5)
HCT: 16.9 % — ABNORMAL LOW (ref 36.0–46.0)
Hemoglobin: 5.9 g/dL — CL (ref 12.0–15.0)
Lymphocytes Relative: 25 % (ref 12–46)
Lymphs Abs: 2.6 10*3/uL (ref 0.7–4.0)
MCH: 27.8 pg (ref 26.0–34.0)
MCHC: 34.9 g/dL (ref 30.0–36.0)
MCV: 79.7 fL (ref 78.0–100.0)
Metamyelocytes Relative: 0 %
Monocytes Absolute: 0.3 10*3/uL (ref 0.1–1.0)
Monocytes Relative: 3 % (ref 3–12)
Myelocytes: 0 %
Neutro Abs: 7.1 10*3/uL (ref 1.7–7.7)
Neutrophils Relative %: 67 % (ref 43–77)
Platelets: 304 10*3/uL (ref 150–400)
Promyelocytes Absolute: 0 %
RBC: 2.12 MIL/uL — ABNORMAL LOW (ref 3.87–5.11)
RDW: 29.3 % — ABNORMAL HIGH (ref 11.5–15.5)
WBC: 10.5 10*3/uL (ref 4.0–10.5)
nRBC: 7 /100 WBC — ABNORMAL HIGH

## 2013-01-16 NOTE — Progress Notes (Signed)
Patient ID: [name], female   DOB: 12-Oct-1960, 52 y.o.   MRN: [id number] Labs results received HGB 5.9 Advised pt to return to clinic to type and cross to transfuse 1 unit of PRBC . Pt depends on transportation services and is unable to rtn at this time. Explain s/s of anemia if any occur proceed to the ED. Order placed for Monday CBC and type and cross. Pt verbalizes understanding.

## 2013-01-16 NOTE — Procedures (Signed)
[facility]  Procedure Note  [name] AVW:[id number] DOB: Oct 31, 1960 DOA: 01/16/2013   PCP: MATTHEWS,MICHELLE A., MD   Associated Diagnosis: Sickle Cell Disease  Procedure Note: Lab blood draw    Condition During Procedure: Tolerated well   Condition at Discharge: Tolerated well. No complaints. Patient in no apparent distress at this time.    Allyn Kenner, RN  [facility]

## 2013-01-16 NOTE — Progress Notes (Signed)
[facility] PROGRESS NOTE Patient ID: Eniola Cerullo, female   DOB: 30-Jan-1961, 52 y.o.   MRN: [id number] PCP: Marthann Schiller, MD 01/16/2013   Chief Complaint  Patient presents with  . Follow-up    Subjective: Ms. Feldstein is in today for a follow up appt proceeding treatment and completion of a course of antibiotics. Her legs have vast improvement. She expressed she had a unexplained weakness with chills and arthralgia pain it lasted 2 days and was self limited.   Allergies  Allergen Reactions  . Ampicillin     Per patient, she tolerates cephalosporins without problems  . Demerol (Meperidine)      Outpatient Encounter Prescriptions as of 01/16/2013  Medication Sig Dispense Refill  . Ascorbic Acid (VITAMIN C PO) Take 500 mg elemental calcium/kg/hr by mouth daily.      . Calcium-Vitamin D (CALTRATE 600 PLUS-VIT D PO) Take 1 tablet by mouth daily.      Marland Kitchen dexlansoprazole (DEXILANT) 60 MG capsule Take 1 capsule (60 mg total) by mouth daily.  30 capsule  2  . Difluprednate (DUREZOL) 0.05 % EMUL Apply 1 drop to eye 2 (two) times daily. Right eye      . fish oil-omega-3 fatty acids 1000 MG capsule Take 1 g by mouth daily.      . folic acid (FOLVITE) 800 MCG tablet Take 0.5 tablets (400 mcg total) by mouth daily.  60 tablet  1  . ibuprofen (ADVIL,MOTRIN) 800 MG tablet Take 800 mg by mouth every 8 (eight) hours as needed for pain.      Marland Kitchen oxycodone (OXY-IR) 5 MG capsule Take 5 mg by mouth every 4 (four) hours as needed for pain.      . predniSONE (DELTASONE) 10 MG tablet Take 1 tablet (10 mg total) by mouth daily.  30 tablet  0  . cephALEXin (KEFLEX) 500 MG capsule Take 1 capsule (500 mg total) by mouth 2 (two) times daily.  20 capsule  0  . hydrocortisone cream 0.5 % Apply topically 2 (two) times daily.  30 g  0  . levothyroxine (SYNTHROID, LEVOTHROID) 50 MCG tablet Take 1 tablet (50 mcg total) by mouth daily before breakfast.  60 tablet  0  . levothyroxine (SYNTHROID, LEVOTHROID) 50  MCG tablet Take 50 mcg by mouth daily before breakfast.      . oxyCODONE-acetaminophen (PERCOCET/ROXICET) 5-325 MG per tablet Take 1 tablet by mouth every 4 (four) hours as needed.  50 tablet  0  . promethazine (PHENERGAN) 25 MG tablet Take 25 mg by mouth every 4 (four) hours as needed. For nausea      . [DISCONTINUED] promethazine (PHENERGAN) 25 MG tablet Take 1 tablet (25 mg total) by mouth every 6 (six) hours as needed for nausea.  20 tablet  0   No facility-administered encounter medications on file as of 01/16/2013.   Physical Exam   Filed Vitals:   01/16/13 1348  BP: 126/71  Pulse: 66  Temp: 98.3 F (36.8 C)  Resp: 18    General: Alert, awake, oriented x3, in no acute distress.  HEENT: [place]/AT PEERL, EOMI Neck: Trachea midline,  no masses, no thyromegal,y no JVD, no carotid bruit OROPHARYNX:  Moist, No exudate/ erythema/lesions.  Heart: Regular rate and rhythm, without murmurs, rubs, gallops, PMI non-displaced, no heaves or thrills on palpation.  Lungs: Clear to auscultation, no wheezing or rhonchi noted. No increased vocal fremitus resonant to percussion  Abdomen: Soft, nontender, nondistended, positive bowel sounds, no masses no hepatosplenomegaly noted.Marland Kitchen  Neuro: No focal neurological deficits noted cranial nerves II through XII grossly intact. DTRs 2+ bilaterally upper and lower extremities. Strength 5 out of 5 in bilateral upper and lower extremities. Musculoskeletal: No warm swelling or erythema around joints, no spinal tenderness noted. Psychiatric: Patient alert and oriented x3, good insight and cognition, good recent to remote recall. Lymph node survey: No cervical axillary or inguinal lymphadenopathy noted.     Assessment/Plan: Sickle cell disease with out crisis : stable at this time. Continue her folic acid, and painful crisis is managed by Percocet prn. Anemia : repeat her CBC and evaluate for a transfusion. She is expressing increase fatigue and weakness. Rheumatoid  arthritis: she is rescheduling her appt. Appt missed transportation arrived too late for pt to be seen.     Gwinda Passe, NP-C

## 2013-01-16 NOTE — Progress Notes (Signed)
Deb from lab called with critical Hgb of 5.9.  Marcelino Duster, NP notified verbally in person.

## 2013-01-19 ENCOUNTER — Non-Acute Institutional Stay (HOSPITAL_COMMUNITY)
Admission: AD | Admit: 2013-01-19 | Discharge: 2013-01-19 | Disposition: A | Discharge status: Home / Self Care | Payer: Medicare Other | Source: Ambulatory Visit | Attending: Internal Medicine | Admitting: Internal Medicine

## 2013-01-19 ENCOUNTER — Telehealth: Payer: Self-pay | Admitting: Internal Medicine

## 2013-01-19 DIAGNOSIS — D571 Sickle-cell disease without crisis: Secondary | ICD-10-CM | POA: Insufficient documentation

## 2013-01-19 LAB — HEMOGLOBIN AND HEMATOCRIT, BLOOD
HCT: 16.4 % — ABNORMAL LOW (ref 36.0–46.0)
Hemoglobin: 5.9 g/dL — CL (ref 12.0–15.0)

## 2013-01-19 LAB — PREPARE RBC (CROSSMATCH)

## 2013-01-19 NOTE — Telephone Encounter (Signed)
Called patient to follow-up on low Hb from Friday. Message left on both home and cell phone. Pt will likely require a transfusion.

## 2013-01-19 NOTE — Progress Notes (Signed)
Pt received one unit of PRBCs; no complications noted; IV removed with catheter intact; pt alert and oriented and discharged to home

## 2013-01-19 NOTE — Progress Notes (Signed)
Lab called with a critical Hgb of 5.9.  Will notify Dr. Ashley Royalty, as this was the patient's previous Hgb.  1 Unit of PRBC's currently infusing.  RN will continue to monitor patient.

## 2013-01-20 ENCOUNTER — Non-Acute Institutional Stay (HOSPITAL_COMMUNITY)
Admission: AD | Admit: 2013-01-20 | Discharge: 2013-01-20 | Disposition: A | Discharge status: Home / Self Care | Payer: Medicare Other | Source: Ambulatory Visit | Attending: Internal Medicine | Admitting: Internal Medicine

## 2013-01-20 DIAGNOSIS — D571 Sickle-cell disease without crisis: Secondary | ICD-10-CM | POA: Insufficient documentation

## 2013-01-20 LAB — HEMOGLOBIN AND HEMATOCRIT, BLOOD
HCT: 18.5 % — ABNORMAL LOW (ref 36.0–46.0)
Hemoglobin: 6.8 g/dL — CL (ref 12.0–15.0)

## 2013-01-20 LAB — PREPARE RBC (CROSSMATCH)

## 2013-01-20 NOTE — Procedures (Signed)
[facility]  Procedure Note  [name] ZOX:[id number] DOB: [birth year] DOA: 01/20/2013   PCP: MATTHEWS,MICHELLE A., MD   Associated Diagnosis: Sickle Cell  Procedure Note: Patient recvd 1 unit of PRBC's   Condition During Procedure: tolerated without difficulty, no s/s of reaction   Condition at Discharge: IV removed without difficulty, patient stable at discharge   Lanae Boast, RN  [facility]

## 2013-01-23 LAB — TYPE AND SCREEN
ABO/RH(D): B POS
Antibody Screen: NEGATIVE
Unit division: 0
Unit division: 0
Unit division: 0

## 2013-02-05 ENCOUNTER — Other Ambulatory Visit: Payer: Medicare Other | Admitting: *Deleted

## 2013-02-05 DIAGNOSIS — D571 Sickle-cell disease without crisis: Secondary | ICD-10-CM

## 2013-02-05 LAB — HEMOGLOBIN AND HEMATOCRIT, BLOOD
HCT: 20.5 % — ABNORMAL LOW (ref 36.0–46.0)
Hemoglobin: 7.2 g/dL — ABNORMAL LOW (ref 12.0–15.0)

## 2013-03-03 ENCOUNTER — Telehealth (HOSPITAL_COMMUNITY): Payer: Self-pay | Admitting: *Deleted

## 2013-03-03 ENCOUNTER — Telehealth: Payer: Self-pay | Admitting: Internal Medicine

## 2013-03-03 NOTE — Telephone Encounter (Signed)
Received patient call. Patient states was trying to make office visit appointment for this Friday. Patient c/o left wrist pain and stiffness, states its arthritis and pain/stiffness increasing. Patient c/o left leg swelling and dark painful spots to left leg x1 week. Patient c/o right pinky and ring finger numbness x1 month. Informed patient that this RN will notify MD of above information  for recommendations and patient is to receive return call. Patient acknowledges.

## 2013-03-04 ENCOUNTER — Encounter: Payer: Self-pay | Admitting: Internal Medicine

## 2013-03-04 ENCOUNTER — Ambulatory Visit (INDEPENDENT_AMBULATORY_CARE_PROVIDER_SITE_OTHER): Payer: Medicare Other | Admitting: Internal Medicine

## 2013-03-04 VITALS — BP 128/68 | HR 83 | Temp 98.6°F | Resp 14 | Ht 72.0 in | Wt 153.5 lb

## 2013-03-04 DIAGNOSIS — I8000 Phlebitis and thrombophlebitis of superficial vessels of unspecified lower extremity: Secondary | ICD-10-CM | POA: Insufficient documentation

## 2013-03-04 DIAGNOSIS — D571 Sickle-cell disease without crisis: Secondary | ICD-10-CM

## 2013-03-04 DIAGNOSIS — I8002 Phlebitis and thrombophlebitis of superficial vessels of left lower extremity: Secondary | ICD-10-CM

## 2013-03-04 NOTE — Progress Notes (Signed)
  Subjective:    Patient ID: [name], female    DOB: 1960-11-09, 52 y.o.   MRN: [id number]  Wrist Pain  Pertinent negatives include no fever.  Leg Pain   Hand Pain  Pertinent negatives include no chest pain.  : Pt here with c/o pain and swelling in LLE. Pt states that she had swelling 2 days ago which resolved with elevation of the extremity. She also states that she has had some tenderness along the " litre" in the back of her leg. She denies any warmth or erythema. She denies any SOB, Fever or chills.     Review of Systems  Constitutional: Negative for fever and chills.  HENT: Negative.   Eyes: Negative.   Respiratory: Negative.  Negative for shortness of breath.   Cardiovascular: Negative.  Negative for chest pain.  Gastrointestinal: Negative.   Endocrine: Negative.   Genitourinary: Negative.   Skin: Negative.   Allergic/Immunologic: Negative.   Neurological: Negative.   Hematological: Negative.   Psychiatric/Behavioral: Negative.        Objective:   Physical Exam  Constitutional: She is oriented to person, place, and time. She appears well-developed and well-nourished.  HENT:  Head: Normocephalic and atraumatic.  Eyes: Conjunctivae and EOM are normal. Pupils are equal, round, and reactive to light. No scleral icterus.  Neck: Normal range of motion. Neck supple. No JVD present. No thyromegaly present.  Cardiovascular: Normal rate and regular rhythm.  Exam reveals no gallop and no friction rub.   No murmur heard. Pulmonary/Chest: Effort normal and breath sounds normal. She has no wheezes. She has no rales.  Abdominal: Soft. Bowel sounds are normal. She exhibits no distension and no mass. There is no tenderness.  Musculoskeletal: Normal range of motion.  Lymphadenopathy:    She has no cervical adenopathy.  Neurological: She is alert and oriented to person, place, and time. No cranial nerve deficit.  Skin: Skin is warm and dry.  Pt has tenderness on palpation of the  small saphenous vein.   Psychiatric: She has a normal mood and affect. Her behavior is normal. Judgment and thought content normal.          Assessment & Plan:  Pt presenting with venous thrombophlebitis. Recommend elevation of extremity, NSAID's (pt has Motrin 800 mg), warm and cold compresses.   Sickle Cell Disease: Pt has been asymptomatic. She has been compliant with Folic acid. Hydrea contraindicated secondary to low Hb   Rheumatoid Arthritis: Pt has a positive CCP and was referred to Rheumatology. They dod not accept her insurance and we will try to find her another referral.  Pt to follow up for labs tomorrow morning.

## 2013-03-06 ENCOUNTER — Other Ambulatory Visit: Payer: Medicare Other | Admitting: *Deleted

## 2013-03-06 DIAGNOSIS — D571 Sickle-cell disease without crisis: Secondary | ICD-10-CM

## 2013-03-06 DIAGNOSIS — I8002 Phlebitis and thrombophlebitis of superficial vessels of left lower extremity: Secondary | ICD-10-CM

## 2013-03-07 LAB — CBC WITH DIFFERENTIAL/PLATELET
Basophils Relative: 0 % (ref 0–1)
Eosinophils Relative: 4 % (ref 0–5)
HCT: 20.8 % — ABNORMAL LOW (ref 36.0–46.0)
Hemoglobin: 7.4 g/dL — ABNORMAL LOW (ref 12.0–15.0)
Lymphocytes Relative: 24 % (ref 12–46)
MCH: 29.5 pg (ref 26.0–34.0)
MCHC: 35.6 g/dL (ref 30.0–36.0)
MCV: 82.9 fL (ref 78.0–100.0)
Monocytes Relative: 7 % (ref 3–12)
Neutrophils Relative %: 65 % (ref 43–77)
Platelets: 307 10*3/uL (ref 150–400)
RBC: 2.51 MIL/uL — ABNORMAL LOW (ref 3.87–5.11)
RDW: 20.1 % — ABNORMAL HIGH (ref 11.5–15.5)
WBC: 10.8 10*3/uL — ABNORMAL HIGH (ref 4.0–10.5)

## 2013-03-07 LAB — COMPREHENSIVE METABOLIC PANEL
ALT: 28 U/L (ref 0–35)
AST: 71 U/L — ABNORMAL HIGH (ref 0–37)
Albumin: 4.2 g/dL (ref 3.5–5.2)
Alkaline Phosphatase: 78 U/L (ref 39–117)
BUN: 7 mg/dL (ref 6–23)
CO2: 24 mEq/L (ref 19–32)
Calcium: 9.9 mg/dL (ref 8.4–10.5)
Chloride: 103 mEq/L (ref 96–112)
Creat: 0.72 mg/dL (ref 0.50–1.10)
Glucose, Bld: 108 mg/dL — ABNORMAL HIGH (ref 70–99)
Potassium: 4.9 mEq/L (ref 3.5–5.3)
Sodium: 137 mEq/L (ref 135–145)
Total Bilirubin: 3.8 mg/dL — ABNORMAL HIGH (ref 0.3–1.2)
Total Protein: 7.9 g/dL (ref 6.0–8.3)

## 2013-03-07 LAB — RETICULOCYTES
ABS Retic: 559.7 10*3/uL — ABNORMAL HIGH (ref 19.0–186.0)
RBC.: 2.51 MIL/uL — ABNORMAL LOW (ref 3.87–5.11)
Retic Ct Pct: 22.3 % — ABNORMAL HIGH (ref 0.4–2.3)

## 2013-03-13 NOTE — Telephone Encounter (Signed)
CLOSED

## 2013-03-24 ENCOUNTER — Telehealth: Payer: Self-pay | Admitting: Internal Medicine

## 2013-04-03 NOTE — Telephone Encounter (Signed)
[name] called requesting refill on her predinisone last received 12/16/12 with no refill left message to call the office regarding refill. Dtr states she is out of town.

## 2013-04-13 NOTE — Telephone Encounter (Signed)
Ms Mcglothen left message requesting a refill for prednisone.

## 2013-05-08 ENCOUNTER — Telehealth (HOSPITAL_COMMUNITY): Payer: Self-pay

## 2013-05-08 NOTE — Telephone Encounter (Signed)
This CM spoke with [name] who came on the line questioning what she needs to do to get her records faxed. This CM advised if she is changing her PCP she will need to come in to sign a release/disclosure form. [name] verbalized understanding.   Karoline Caldwell, RN, BSN, [place]    [phone]

## 2013-05-15 ENCOUNTER — Telehealth: Payer: Self-pay | Admitting: *Deleted

## 2013-05-15 NOTE — Telephone Encounter (Addendum)
Pt has transferred care to Dr.Gretchen Alwyn Ren [phone] phone / [phone] fax Medical Records Mailed to [facility] on 05/15/13.

## 2013-10-05 ENCOUNTER — Telehealth: Payer: Self-pay | Admitting: General Practice

## 2013-10-05 NOTE — Telephone Encounter (Signed)
Left voice mail for patient to call to schedule appointment for assessment for group therapy.

## 2014-05-05 ENCOUNTER — Emergency Department (HOSPITAL_COMMUNITY): Payer: Medicare Other

## 2014-05-05 ENCOUNTER — Encounter (HOSPITAL_COMMUNITY): Payer: Self-pay | Admitting: Emergency Medicine

## 2014-05-05 ENCOUNTER — Emergency Department (HOSPITAL_COMMUNITY)
Admission: EM | Admit: 2014-05-05 | Discharge: 2014-05-05 | Disposition: A | Discharge status: Home / Self Care | Payer: Medicare Other | Attending: Emergency Medicine | Admitting: Emergency Medicine

## 2014-05-05 DIAGNOSIS — R1030 Lower abdominal pain, unspecified: Secondary | ICD-10-CM

## 2014-05-05 DIAGNOSIS — R112 Nausea with vomiting, unspecified: Secondary | ICD-10-CM | POA: Insufficient documentation

## 2014-05-05 DIAGNOSIS — E039 Hypothyroidism, unspecified: Secondary | ICD-10-CM | POA: Diagnosis not present

## 2014-05-05 DIAGNOSIS — R1033 Periumbilical pain: Secondary | ICD-10-CM | POA: Insufficient documentation

## 2014-05-05 DIAGNOSIS — Z79899 Other long term (current) drug therapy: Secondary | ICD-10-CM | POA: Insufficient documentation

## 2014-05-05 DIAGNOSIS — R1031 Right lower quadrant pain: Secondary | ICD-10-CM | POA: Insufficient documentation

## 2014-05-05 DIAGNOSIS — M1990 Unspecified osteoarthritis, unspecified site: Secondary | ICD-10-CM | POA: Insufficient documentation

## 2014-05-05 DIAGNOSIS — Z8719 Personal history of other diseases of the digestive system: Secondary | ICD-10-CM | POA: Diagnosis not present

## 2014-05-05 DIAGNOSIS — Z8659 Personal history of other mental and behavioral disorders: Secondary | ICD-10-CM | POA: Insufficient documentation

## 2014-05-05 DIAGNOSIS — Z880 Allergy status to penicillin: Secondary | ICD-10-CM | POA: Diagnosis not present

## 2014-05-05 DIAGNOSIS — Z872 Personal history of diseases of the skin and subcutaneous tissue: Secondary | ICD-10-CM | POA: Diagnosis not present

## 2014-05-05 DIAGNOSIS — R109 Unspecified abdominal pain: Secondary | ICD-10-CM | POA: Diagnosis present

## 2014-05-05 DIAGNOSIS — D571 Sickle-cell disease without crisis: Secondary | ICD-10-CM | POA: Insufficient documentation

## 2014-05-05 DIAGNOSIS — Z7952 Long term (current) use of systemic steroids: Secondary | ICD-10-CM | POA: Insufficient documentation

## 2014-05-05 DIAGNOSIS — Z8701 Personal history of pneumonia (recurrent): Secondary | ICD-10-CM | POA: Insufficient documentation

## 2014-05-05 LAB — COMPREHENSIVE METABOLIC PANEL
ALT: 61 U/L — ABNORMAL HIGH (ref 0–35)
AST: 96 U/L — ABNORMAL HIGH (ref 0–37)
Albumin: 4.4 g/dL (ref 3.5–5.2)
Alkaline Phosphatase: 68 U/L (ref 39–117)
Anion gap: 17 — ABNORMAL HIGH (ref 5–15)
BUN: 10 mg/dL (ref 6–23)
CO2: 20 mEq/L (ref 19–32)
Calcium: 9.8 mg/dL (ref 8.4–10.5)
Chloride: 102 mEq/L (ref 96–112)
Creatinine, Ser: 0.89 mg/dL (ref 0.50–1.10)
GFR calc Af Amer: 84 mL/min — ABNORMAL LOW (ref >90)
GFR calc non Af Amer: 72 mL/min — ABNORMAL LOW (ref >90)
Glucose, Bld: 92 mg/dL (ref 70–99)
Potassium: 3.8 mEq/L (ref 3.7–5.3)
Sodium: 139 mEq/L (ref 137–147)
Total Bilirubin: 3.8 mg/dL — ABNORMAL HIGH (ref 0.3–1.2)
Total Protein: 8.5 g/dL — ABNORMAL HIGH (ref 6.0–8.3)

## 2014-05-05 LAB — URINALYSIS, ROUTINE W REFLEX MICROSCOPIC
Glucose, UA: NEGATIVE mg/dL
Hgb urine dipstick: NEGATIVE
Ketones, ur: NEGATIVE mg/dL
Nitrite: NEGATIVE
Protein, ur: NEGATIVE mg/dL
Specific Gravity, Urine: 1.013 (ref 1.005–1.030)
Urobilinogen, UA: 2 mg/dL — ABNORMAL HIGH (ref 0.0–1.0)
pH: 6.5 (ref 5.0–8.0)

## 2014-05-05 LAB — CBC WITH DIFFERENTIAL/PLATELET
Band Neutrophils: 0 % (ref 0–10)
Basophils Absolute: 0 10*3/uL (ref 0.0–0.1)
Basophils Relative: 0 % (ref 0–1)
Blasts: 0 %
Eosinophils Absolute: 0.1 10*3/uL (ref 0.0–0.7)
Eosinophils Relative: 1 % (ref 0–5)
HCT: 19 % — ABNORMAL LOW (ref 36.0–46.0)
Hemoglobin: 6.5 g/dL — CL (ref 12.0–15.0)
Lymphocytes Relative: 27 % (ref 12–46)
Lymphs Abs: 2.8 10*3/uL (ref 0.7–4.0)
MCH: 23.7 pg — ABNORMAL LOW (ref 26.0–34.0)
MCHC: 34.2 g/dL (ref 30.0–36.0)
MCV: 69.3 fL — ABNORMAL LOW (ref 78.0–100.0)
Metamyelocytes Relative: 0 %
Monocytes Absolute: 0.7 10*3/uL (ref 0.1–1.0)
Monocytes Relative: 7 % (ref 3–12)
Myelocytes: 0 %
Neutro Abs: 6.8 10*3/uL (ref 1.7–7.7)
Neutrophils Relative %: 65 % (ref 43–77)
Platelets: 318 10*3/uL (ref 150–400)
Promyelocytes Absolute: 0 %
RBC: 2.74 MIL/uL — ABNORMAL LOW (ref 3.87–5.11)
RDW: 25.4 % — ABNORMAL HIGH (ref 11.5–15.5)
WBC: 10.4 10*3/uL (ref 4.0–10.5)
nRBC: 11 /100 WBC — ABNORMAL HIGH

## 2014-05-05 LAB — URINE MICROSCOPIC-ADD ON

## 2014-05-05 LAB — RETICULOCYTES
RBC.: 2.55 MIL/uL — ABNORMAL LOW (ref 3.87–5.11)
Retic Count, Absolute: 413.1 10*3/uL — ABNORMAL HIGH (ref 19.0–186.0)
Retic Ct Pct: 16.2 % — ABNORMAL HIGH (ref 0.4–3.1)

## 2014-05-05 LAB — LIPASE, BLOOD: Lipase: 50 U/L (ref 11–59)

## 2014-05-05 LAB — I-STAT CG4 LACTIC ACID, ED: Lactic Acid, Venous: 1.44 mmol/L (ref 0.5–2.2)

## 2014-05-05 MED ORDER — HYDROCODONE-ACETAMINOPHEN 5-325 MG PO TABS: ORAL_TABLET | ORAL | Status: DC

## 2014-05-05 MED ORDER — PROMETHAZINE HCL 25 MG PO TABS: 25.0000 mg | ORAL_TABLET | Freq: Four times a day (QID) | ORAL | Status: DC | PRN

## 2014-05-05 MED ORDER — SODIUM CHLORIDE 0.9 % IV BOLUS (SEPSIS)
1000.0000 mL | Freq: Once | INTRAVENOUS | Status: AC
  Administered 2014-05-05: 1000 mL via INTRAVENOUS

## 2014-05-05 MED ORDER — ONDANSETRON HCL 4 MG/2ML IJ SOLN
4.0000 mg | Freq: Once | INTRAMUSCULAR | Status: AC
  Administered 2014-05-05: 4 mg via INTRAVENOUS
  Filled 2014-05-05: qty 2

## 2014-05-05 MED ORDER — HYDROMORPHONE HCL 1 MG/ML IJ SOLN
1.0000 mg | Freq: Once | INTRAMUSCULAR | Status: AC
  Administered 2014-05-05: 1 mg via INTRAVENOUS
  Filled 2014-05-05: qty 1

## 2014-05-05 MED ORDER — ACETAMINOPHEN 325 MG PO TABS
650.0000 mg | ORAL_TABLET | Freq: Once | ORAL | Status: DC
  Filled 2014-05-05: qty 2

## 2014-05-05 MED ORDER — PROMETHAZINE HCL 25 MG/ML IJ SOLN
12.5000 mg | Freq: Once | INTRAMUSCULAR | Status: AC
  Administered 2014-05-05: 12.5 mg via INTRAVENOUS
  Filled 2014-05-05: qty 1

## 2014-05-05 MED ORDER — DICYCLOMINE HCL 10 MG PO CAPS
10.0000 mg | ORAL_CAPSULE | Freq: Once | ORAL | Status: AC
Start: 2014-05-05 — End: 2014-05-05
  Administered 2014-05-05: 10 mg via ORAL
  Filled 2014-05-05: qty 1

## 2014-05-05 NOTE — ED Notes (Signed)
HGB 6.5 received from lab. EDP aware.

## 2014-05-05 NOTE — ED Notes (Signed)
Pt called out and began vomiting. Pt stating "the soda made me throw up." Pt vomited approx 200cc of liquid vomit. Pt cleaned up and changed into clean gown. EDP aware.

## 2014-05-05 NOTE — Discharge Instructions (Signed)
Please read and follow all provided instructions.  Your diagnoses today include:  1. Non-intractable vomiting with nausea, vomiting of unspecified type   2. 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 See below for your results today.   Medications prescribed:   Phenergan (promethazine) - for nausea and vomiting   Vicodin (hydrocodone/acetaminophen) - narcotic pain medication  DO NOT drive or perform any activities that require you to be awake and alert because this medicine can make you drowsy. BE VERY CAREFUL not to take multiple medicines containing Tylenol (also called acetaminophen). Doing so can lead to an overdose which can damage your liver and cause liver failure and possibly death.  Take any prescribed medications only as directed.  Home care instructions:   Follow any educational materials contained in this packet.  Follow-up instructions: Please follow-up with your primary care provider in the next 2 days for further evaluation of your symptoms.    Return instructions:  SEEK IMMEDIATE MEDICAL ATTENTION IF:  The pain does not go away or becomes severe   A temperature above 101F develops   Repeated vomiting occurs (multiple episodes)   The pain becomes localized to portions of the abdomen. The right side could possibly be appendicitis. In an adult, the left lower portion of the abdomen could be colitis or diverticulitis.   Blood is being passed in stools or vomit (bright red or black tarry stools)   You develop chest pain, difficulty breathing, dizziness or fainting, or become confused, poorly responsive, or inconsolable (young children)  If you have any other emergent  concerns regarding your health  Additional Information: Abdominal (belly) pain can be caused by many things. Your caregiver performed an examination and possibly ordered blood/urine tests and imaging (CT scan, x-rays, ultrasound). Many cases can be observed and treated at home after initial evaluation in the emergency department. Even though you are being discharged home, abdominal pain can be unpredictable. Therefore, you need a repeated exam if your pain does not resolve, returns, or worsens. Most patients with abdominal pain don't have to be admitted to the hospital or have surgery, but serious problems like appendicitis and gallbladder attacks can start out as nonspecific pain. Many abdominal conditions cannot be diagnosed in one visit, so follow-up evaluations are very important.  Your vital signs today were: BP 121/58   Pulse 64   Temp(Src) 98.3 F (36.8 C) (Oral)   Resp 18   SpO2 93% If your blood pressure (bp) was elevated above 135/85 this visit, please have this repeated by your doctor within one month. --------------

## 2014-05-05 NOTE — ED Notes (Signed)
Generalized abd pain  For one hour  .  No n v or diarrhea.  lmp  none

## 2014-05-05 NOTE — ED Provider Notes (Signed)
CSN: [id number]     Arrival date & time 05/05/14  1502 History   First MD Initiated Contact with Patient 05/05/14 1604     Chief Complaint  Patient presents with  . Abdominal Pain     (Consider location/radiation/quality/duration/timing/severity/associated sxs/prior Treatment) HPI Comments: Patient with history of sickle cell anemia, rheumatoid arthritis, history of cholecystectomy -- presents with c/o lower abd pain starting 1 hr PTA. Pain is burning with radiation to back. She had normal BM after the pain began. No fever, N/V/D. No urinary symptoms. No vaginal symptoms. Typical sickle cell pain is in elbows and knees and this is different. She is not on chronic pain medications. She is on prednisone for RA. Ibuprofen did not help pain.   Patient is a 53 y.o. female presenting with abdominal pain. The history is provided by the patient and medical records.  Abdominal Pain Associated symptoms: no chest pain, no cough, no diarrhea, no dysuria, no fever, no nausea, no shortness of breath, no sore throat and no vomiting     Past Medical History  Diagnosis Date  . Sickle cell anemia   . Hypothyroidism   . Pneumonia   . Anxiety   . Arthritis   . Ulcers of both lower extremities   . Hypokalemia   . Aneurysm   . Colon ulcer    Past Surgical History  Procedure Laterality Date  . Cerebral aneurysm repair    . Ankle surgery  1989 and 1990  . Esophagogastroduodenoscopy  05/12/2012    Procedure: ESOPHAGOGASTRODUODENOSCOPY (EGD);  Surgeon: Hart Carwin, MD;  Location: [facility];  Service: Endoscopy;  Laterality: N/A;  . Colonoscopy  05/12/2012    Procedure: COLONOSCOPY;  Surgeon: Hart Carwin, MD;  Location: [facility];  Service: Endoscopy;  Laterality: N/A;  . Givens capsule study  05/13/2012    Procedure: GIVENS CAPSULE STUDY;  Surgeon: Hart Carwin, MD;  Location: [facility];  Service: Endoscopy;  Laterality: N/A;   Family History  Problem Relation Age of Onset  . Cancer  Mother 15    Esophageal  . Sickle cell trait Mother   . Sickle cell trait Father   . HIV/AIDS Brother    History  Substance Use Topics  . Smoking status: Never Smoker   . Smokeless tobacco: Not on file  . Alcohol Use: No   OB History   Grav Para Term Preterm Abortions TAB SAB Ect Mult Living                 Review of Systems  Constitutional: Negative for fever.  HENT: Negative for rhinorrhea and sore throat.   Eyes: Negative for redness.  Respiratory: Negative for cough and shortness of breath.   Cardiovascular: Negative for chest pain.  Gastrointestinal: Positive for abdominal pain. Negative for nausea, vomiting, diarrhea and blood in stool.  Genitourinary: Negative for dysuria.  Musculoskeletal: Negative for myalgias.  Skin: Negative for rash.  Neurological: Negative for headaches.    Allergies  Ampicillin and Demerol  Home Medications   Prior to Admission medications   Medication Sig Start Date End Date Taking? Authorizing Provider  Ascorbic Acid (VITAMIN C PO) Take 500 mg elemental calcium/kg/hr by mouth daily.   Yes Historical Provider, MD  Calcium-Vitamin D (CALTRATE 600 PLUS-VIT D PO) Take 1 tablet by mouth daily.   Yes Historical Provider, MD  dexlansoprazole (DEXILANT) 60 MG capsule Take 1 capsule (60 mg total) by mouth daily. 05/16/12  Yes Gwenyth Bender, MD  Difluprednate (DUREZOL) 0.05 %  EMUL Apply 1 drop to eye 2 (two) times daily. Right eye   Yes Historical Provider, MD  fish oil-omega-3 fatty acids 1000 MG capsule Take 1 g by mouth daily.   Yes Historical Provider, MD  folic acid (FOLVITE) 800 MCG tablet Take 0.5 tablets (400 mcg total) by mouth daily. 11/30/11  Yes Keitha Butte, NP  ibuprofen (ADVIL,MOTRIN) 800 MG tablet Take 800 mg by mouth every 8 (eight) hours as needed for pain.   Yes Historical Provider, MD  levothyroxine (SYNTHROID, LEVOTHROID) 50 MCG tablet Take 50 mcg by mouth daily before breakfast.   Yes Historical Provider, MD  predniSONE  (DELTASONE) 10 MG tablet Take 1 tablet (10 mg total) by mouth daily. 12/16/12  Yes Altha Harm, MD  levothyroxine (SYNTHROID, LEVOTHROID) 50 MCG tablet Take 1 tablet (50 mcg total) by mouth daily before breakfast. 11/30/11 11/29/12  Keitha Butte, NP   BP 131/67  Pulse 65  Temp(Src) 98.3 F (36.8 C) (Oral)  Resp 16  SpO2 97%  Physical Exam  Nursing note and vitals reviewed. Constitutional: She appears well-developed and well-nourished.  HENT:  Head: Normocephalic and atraumatic.  Eyes: Conjunctivae are normal. Right eye exhibits no discharge. Left eye exhibits no discharge.  Neck: Normal range of motion. Neck supple.  Cardiovascular: Normal rate, regular rhythm and normal heart sounds.   Pulmonary/Chest: Effort normal and breath sounds normal.  Abdominal: Soft. Bowel sounds are normal. She exhibits no distension. There is tenderness in the right lower quadrant, periumbilical area and suprapubic area. There is no rebound and no guarding.  Neurological: She is alert.  Skin: Skin is warm and dry.  Psychiatric: She has a normal mood and affect.    ED Course  Procedures (including critical care time) Labs Review Labs Reviewed  CBC WITH DIFFERENTIAL - Abnormal; Notable for the following:    RBC 2.74 (*)    Hemoglobin 6.5 (*)    HCT 19.0 (*)    MCV 69.3 (*)    MCH 23.7 (*)    RDW 25.4 (*)    nRBC 11 (*)    All other components within normal limits  COMPREHENSIVE METABOLIC PANEL - Abnormal; Notable for the following:    Total Protein 8.5 (*)    AST 96 (*)    ALT 61 (*)    Total Bilirubin 3.8 (*)    GFR calc non Af Amer 72 (*)    GFR calc Af Amer 84 (*)    Anion gap 17 (*)    All other components within normal limits  URINALYSIS, ROUTINE W REFLEX MICROSCOPIC - Abnormal; Notable for the following:    Color, Urine ORANGE (*)    APPearance HAZY (*)    Bilirubin Urine SMALL (*)    Urobilinogen, UA 2.0 (*)    Leukocytes, UA TRACE (*)    All other components within  normal limits  RETICULOCYTES - Abnormal; Notable for the following:    Retic Ct Pct 16.2 (*)    RBC. 2.55 (*)    Retic Count, Manual 413.1 (*)    All other components within normal limits  URINE MICROSCOPIC-ADD ON - Abnormal; Notable for the following:    Casts HYALINE CASTS (*)    All other components within normal limits  LIPASE, BLOOD  I-STAT CG4 LACTIC ACID, ED  I-STAT CG4 LACTIC ACID, ED    Imaging Review Dg Abd Acute W/chest  05/05/2014   CLINICAL DATA:  Right lower to mid abdominal pain, history of sickle cell  EXAM: ACUTE ABDOMEN SERIES (ABDOMEN 2 VIEW & CHEST 1 VIEW)  COMPARISON:  None.  FINDINGS: There is no evidence of dilated bowel loops or free intraperitoneal air. No radiopaque calculi or other significant radiographic abnormality is seen. Multiple surgical clips in the right upper quadrant. There is stable cardiomegaly. Both lungs are clear.  IMPRESSION: Negative abdominal radiographs.  No acute cardiopulmonary disease.   Electronically Signed   By: Elige Ko   On: 05/05/2014 20:15     EKG Interpretation None      4:26 PM Patient seen and examined. Work-up initiated. Medications ordered.   Vital signs reviewed and are as follows: BP 131/67  Pulse 65  Temp(Src) 98.3 F (36.8 C) (Oral)  Resp 16  SpO2 97%  5:21 PM Labs consistent with patient's history of sickle cell anemia; her symptoms today do not seem to be related to sickle cell disease. She is feeling better after pain medication. Will re-evaluate.   6:18 PM Patient vomited after fluid challenge. Additional antiemetics ordered. Pain is very minimal lower abdomen even with deep palpation. Will get acute abd series.   10:48 PM Pt feeling better, tolerating PO's. Abd is soft, non-tender. Will discharge to home with pain and nausea control. Encouraged PCP f/u for recheck in next 48 hrs. The patient was urged to return to the Emergency Department immediately with worsening of current symptoms, worsening  abdominal pain, persistent vomiting, blood noted in stools, fever, or any other concerns. The patient verbalized understanding.   Patient counseled on use of narcotic pain medications. Counseled not to combine these medications with others containing tylenol. Urged not to drink alcohol, drive, or perform any other activities that requires focus while taking these medications. The patient verbalizes understanding and agrees with the plan.  MDM   Final diagnoses:  Non-intractable vomiting with nausea, vomiting of unspecified type  Lower abdominal pain  Hb-SS disease without crisis   Abd pain, N/V: Symptoms controlled. Pt had similar pain previously. Work-up unremarkable. Doubt colitis, ischemic or otherwise, given no diarrhea or blood in stool. Abd pain is currently resolved. Vomiting is resolved and pt is tolerating POs. Unclear etiology of sx but pain is not consistent with previous vasoocclusive crises. Patient appears well. Feel that she is safe for d/c to home.   Sickle cell: stable. Anemia is close to patient's baseline of 7-8. She is not otherwise symptomatic. No indication for transfusion.   No dangerous or life-threatening conditions suspected or identified by history, physical exam, and by work-up. No indications for hospitalization identified.      Renne Crigler, PA-C 05/05/14 [phone]

## 2014-05-05 NOTE — ED Notes (Signed)
Pt given a gingerale

## 2014-05-05 NOTE — ED Notes (Signed)
Pt tolerated ginger ale. Pt states she feels better.

## 2014-05-07 NOTE — ED Provider Notes (Signed)
Medical screening examination/treatment/procedure(s) were performed by non-physician practitioner and as supervising physician I was immediately available for consultation/collaboration.   EKG Interpretation None       Juliet Rude. Rubin Payor, MD 05/07/14 [phone]

## 2014-05-11 ENCOUNTER — Ambulatory Visit (INDEPENDENT_AMBULATORY_CARE_PROVIDER_SITE_OTHER): Payer: Medicare Other | Admitting: Family Medicine

## 2014-05-11 ENCOUNTER — Encounter: Payer: Self-pay | Admitting: Family Medicine

## 2014-05-11 VITALS — BP 115/62 | HR 71 | Temp 98.1°F | Resp 16 | Ht 71.0 in | Wt 147.0 lb

## 2014-05-11 DIAGNOSIS — Z23 Encounter for immunization: Secondary | ICD-10-CM

## 2014-05-11 DIAGNOSIS — D571 Sickle-cell disease without crisis: Secondary | ICD-10-CM

## 2014-05-11 DIAGNOSIS — Z8639 Personal history of other endocrine, nutritional and metabolic disease: Secondary | ICD-10-CM

## 2014-05-11 DIAGNOSIS — R109 Unspecified abdominal pain: Secondary | ICD-10-CM

## 2014-05-11 DIAGNOSIS — M069 Rheumatoid arthritis, unspecified: Secondary | ICD-10-CM

## 2014-05-11 DIAGNOSIS — B182 Chronic viral hepatitis C: Secondary | ICD-10-CM

## 2014-05-11 DIAGNOSIS — B1920 Unspecified viral hepatitis C without hepatic coma: Secondary | ICD-10-CM | POA: Insufficient documentation

## 2014-05-11 DIAGNOSIS — F17200 Nicotine dependence, unspecified, uncomplicated: Secondary | ICD-10-CM

## 2014-05-11 DIAGNOSIS — E038 Other specified hypothyroidism: Secondary | ICD-10-CM

## 2014-05-11 NOTE — Progress Notes (Signed)
Subjective:    Patient ID: [name], female    DOB: 02/16/1961, 53 y.o.   MRN: [id number]  HPI  [name], patient with a history of sickle cell anemia and Hepatitis C virus presents to establish care. She states that she was previously followed by Dr. Jackey Hall. She reports that her last visit with Dr. Charlyne Hall was 1 month ago. She states that Dr. Silva Hall recommended that she re-establishes care with Dr. Ashley Hall for sickle cell anemia and all other primary needs.   She states that she has a history of sickle cell anemia, HbSS. [name] states that she currently takes OTC folic acid for sickle cell anemia. She states that it has been greater than 1 year since last sickle cell crisis. She states that she does not take opiate pain medications consistently. She reports that she takes Ibuprofen as needed for minor aches and pains.    Patient complains of a positive Hepatitis C. Patient tested positive greater than 1 year ago. Test was performed as part of an evaluation of abnormal liver function test:(elevated ALT, elevated AST) and abdominal pain.Patient's current symptoms include abdominal pain, fatigue, jaundice and nausea. Patient denies diarrhea, malaise, pruritis, skin rash, vomiting and weight loss.   [name] is also complaining of rheumatoid arthritis. She states that RA was diagnosed by Dr. August Hall in 2013. She states that she consistently takes Prednisone 10 mg daily with minimal relief. Pain is located in both wrist(s), is described as aching, and is intermittent, moderate .  Associated symptoms include: decreased range of motion and tenderness.      Past Medical History  Diagnosis Date  . Sickle cell anemia   . Hypothyroidism   . Pneumonia   . Anxiety   . Arthritis   . Ulcers of both lower extremities   . Hypokalemia   . Aneurysm   . Colon ulcer    Review of Systems  Constitutional: Positive for fatigue. Negative for fever.  HENT: Negative.   Eyes:  Negative.   Respiratory: Positive for cough and shortness of breath.   Cardiovascular: Positive for palpitations. Negative for chest pain and leg swelling.  Gastrointestinal: Negative.   Endocrine: Negative.   Genitourinary: Positive for urgency. Negative for difficulty urinating.  Musculoskeletal: Negative.  Negative for myalgias.  Skin: Negative.   Allergic/Immunologic: Negative.   Neurological: Positive for weakness. Negative for tremors.  Psychiatric/Behavioral: Positive for agitation. Negative for suicidal ideas and sleep disturbance. The patient is nervous/anxious.      Objective:   Physical Exam  Constitutional: She is oriented to person, place, and time. She appears well-developed and well-nourished.  HENT:  Head: Normocephalic.  Right Ear: External ear normal.  Eyes: Pupils are equal, round, and reactive to light.  Neck: Normal range of motion. Neck supple.  Cardiovascular: Normal rate, regular rhythm, normal heart sounds and intact distal pulses.   Pulmonary/Chest: Effort normal and breath sounds normal. She has no decreased breath sounds.  Abdominal: Soft. Bowel sounds are normal. There is no tenderness.  Musculoskeletal: Normal range of motion.       Right wrist: She exhibits tenderness and bony tenderness.       Left wrist: She exhibits tenderness and bony tenderness.  Lymphadenopathy:       Head (right side): No submental and no submandibular adenopathy present.       Head (left side): No submental and no submandibular adenopathy present.    She has no cervical adenopathy.  Neurological: She is alert  and oriented to person, place, and time. She has normal reflexes. No sensory deficit.  Skin: Skin is warm, dry and intact.  Psychiatric: She has a normal mood and affect. Her behavior is normal. Judgment and thought content normal.         BP 115/62  Pulse 71  Temp(Src) 98.1 F (36.7 C) (Oral)  Resp 16  Ht 5\' 11"  (1.803 m)  Wt 147 lb (66.679 kg)  BMI 20.51  kg/m2 Assessment & Plan:   1.  Hb-SS disease without crisis Patient has been followed by Dr. for the past year for primary care needs. Patient reports that she is transitioning care. She states that she has not been taking folic acid consistently. She reports that she has not had an eye examination recently, so she will need an eye exam to r/o sickle cell retinopathy.  - CBC with Differential - Hemoglobinopathy evaluation   2. Other specified hypothyroidism She is currently taking Levothyroxine 50 mcg for hypothyroidism.  - TSH  3. Rheumatoid arthritis  [name] states that she has been taking Prednisone 10 mg for greater than 2 years for RA. She states that she was previously diagnosed with RA by Dr. Margo Hall in 20 13. She reports that RA pain is not well controlled on Prednisone 10 mg. She states that she was taking Prednisone 50 mg with initial diagnosis, however dose was lowered to 10 mg by Dr. August Hall in 2014.  4. Chronic hepatitis C without hepatic coma  She states that she has a hepatic ultrasound scheduled. She reports that she has not received previous treatment for hepatitis C.  6. Flank pain  - Urinalysis, Complete  7. History of hyperlipidemia  - Lipid Panel  8. Immunization due  - Pneumococcal polysaccharide vaccine 23-valent greater than or equal to 2yo subcutaneous/IM 9. Tobacco dependence Smoking cessation instruction/counseling given:  counseled patient on the dangers of tobacco use, advised patient to stop smoking, and reviewed strategies to maximize success. Tobacco dependence: 3 times per week 4-5 cigarettes.   Preventative care:  Colonoscopy= 2013 Mammogram=2014, will need mammogram Up to date with vaccinations  Will notify Dr. 2014 for current medical records and to get clarification on previous care.  RTC: 2 weeks with Dr. Silva Hall for sickle cell anemia     Carol Royalty, FNP

## 2014-05-12 LAB — CBC WITH DIFFERENTIAL/PLATELET
Basophils Absolute: 0.1 10*3/uL (ref 0.0–0.1)
Basophils Relative: 1 % (ref 0–1)
Eosinophils Absolute: 0 10*3/uL (ref 0.0–0.7)
Eosinophils Relative: 0 % (ref 0–5)
HCT: 18.8 % — ABNORMAL LOW (ref 36.0–46.0)
Hemoglobin: 6.2 g/dL — CL (ref 12.0–15.0)
Lymphocytes Relative: 28 % (ref 12–46)
Lymphs Abs: 2.7 10*3/uL (ref 0.7–4.0)
MCH: 24.4 pg — ABNORMAL LOW (ref 26.0–34.0)
MCHC: 33 g/dL (ref 30.0–36.0)
MCV: 73.4 fL — ABNORMAL LOW (ref 78.0–100.0)
Monocytes Absolute: 0.8 10*3/uL (ref 0.1–1.0)
Monocytes Relative: 8 % (ref 3–12)
Neutro Abs: 6 10*3/uL (ref 1.7–7.7)
Neutrophils Relative %: 63 % (ref 43–77)
Platelets: 328 10*3/uL (ref 150–400)
RBC: 2.54 MIL/uL — ABNORMAL LOW (ref 3.87–5.11)
RDW: 25.4 % — ABNORMAL HIGH (ref 11.5–15.5)
WBC: 9.6 10*3/uL (ref 4.0–10.5)

## 2014-05-12 LAB — LIPID PANEL
Cholesterol: 133 mg/dL (ref 0–200)
HDL: 49 mg/dL (ref >39)
LDL Cholesterol: 66 mg/dL (ref 0–99)
Total CHOL/HDL Ratio: 2.7 Ratio
Triglycerides: 89 mg/dL (ref <150)
VLDL: 18 mg/dL (ref 0–40)

## 2014-05-12 LAB — URINALYSIS, COMPLETE
Bilirubin Urine: NEGATIVE
Casts: NONE SEEN
Crystals: NONE SEEN
Glucose, UA: NEGATIVE mg/dL
Hgb urine dipstick: NEGATIVE
Ketones, ur: NEGATIVE mg/dL
Leukocytes, UA: NEGATIVE
Nitrite: NEGATIVE
Protein, ur: NEGATIVE mg/dL
Specific Gravity, Urine: 1.012 (ref 1.005–1.030)
Urobilinogen, UA: 1 mg/dL (ref 0.0–1.0)
pH: 6 (ref 5.0–8.0)

## 2014-05-12 LAB — TSH: TSH: 1.198 u[IU]/mL (ref 0.350–4.500)

## 2014-05-13 LAB — HEMOGLOBINOPATHY EVALUATION
Hemoglobin Other: 0 %
Hgb A2 Quant: 3.8 % — ABNORMAL HIGH (ref 2.2–3.2)
Hgb A: 0 % — ABNORMAL LOW (ref 96.8–97.8)
Hgb F Quant: 1.5 % (ref 0.0–2.0)
Hgb S Quant: 94.7 % — ABNORMAL HIGH

## 2014-05-31 ENCOUNTER — Telehealth: Payer: Self-pay | Admitting: Family Medicine

## 2014-05-31 ENCOUNTER — Ambulatory Visit: Payer: Medicare Other | Admitting: Internal Medicine

## 2014-05-31 NOTE — Telephone Encounter (Signed)
Notified [name] to inform her that she will need to follow up with [name], who has been her primary provider for the past year. She stated that she was told to come here for sickle cell care. [name] was upset and belligerent. She reports that her care in [place] was more organized and she was seeking that type of care. I informed [name] that she cannot have 2 primary providers. Patient did not understand and was unwilling to listen to an explanation.    Carol Maroon, FNP

## 2014-06-03 ENCOUNTER — Ambulatory Visit: Payer: Medicare Other | Admitting: Internal Medicine

## 2015-10-19 DIAGNOSIS — R109 Unspecified abdominal pain: Secondary | ICD-10-CM | POA: Diagnosis not present

## 2015-10-19 DIAGNOSIS — R0981 Nasal congestion: Secondary | ICD-10-CM | POA: Diagnosis not present

## 2015-10-19 DIAGNOSIS — A084 Viral intestinal infection, unspecified: Secondary | ICD-10-CM | POA: Diagnosis not present

## 2015-10-19 DIAGNOSIS — D571 Sickle-cell disease without crisis: Secondary | ICD-10-CM | POA: Diagnosis not present

## 2015-10-24 ENCOUNTER — Encounter (HOSPITAL_COMMUNITY): Payer: Self-pay | Admitting: Emergency Medicine

## 2015-10-24 ENCOUNTER — Emergency Department (HOSPITAL_COMMUNITY): Payer: Medicare Other

## 2015-10-24 ENCOUNTER — Inpatient Hospital Stay (HOSPITAL_COMMUNITY)
Admission: EM | Admit: 2015-10-24 | Discharge: 2015-10-26 | DRG: 812 | Disposition: A | Discharge status: Home / Self Care | Payer: Medicare Other | Attending: Internal Medicine | Admitting: Internal Medicine

## 2015-10-24 DIAGNOSIS — E860 Dehydration: Secondary | ICD-10-CM | POA: Diagnosis present

## 2015-10-24 DIAGNOSIS — E876 Hypokalemia: Secondary | ICD-10-CM | POA: Diagnosis present

## 2015-10-24 DIAGNOSIS — R809 Proteinuria, unspecified: Secondary | ICD-10-CM | POA: Diagnosis present

## 2015-10-24 DIAGNOSIS — J200 Acute bronchitis due to Mycoplasma pneumoniae: Secondary | ICD-10-CM | POA: Diagnosis not present

## 2015-10-24 DIAGNOSIS — D5700 Hb-SS disease with crisis, unspecified: Secondary | ICD-10-CM | POA: Diagnosis not present

## 2015-10-24 DIAGNOSIS — E038 Other specified hypothyroidism: Secondary | ICD-10-CM

## 2015-10-24 DIAGNOSIS — B1920 Unspecified viral hepatitis C without hepatic coma: Secondary | ICD-10-CM | POA: Diagnosis not present

## 2015-10-24 DIAGNOSIS — J209 Acute bronchitis, unspecified: Secondary | ICD-10-CM | POA: Diagnosis not present

## 2015-10-24 DIAGNOSIS — B182 Chronic viral hepatitis C: Secondary | ICD-10-CM

## 2015-10-24 DIAGNOSIS — R079 Chest pain, unspecified: Secondary | ICD-10-CM | POA: Diagnosis not present

## 2015-10-24 DIAGNOSIS — E039 Hypothyroidism, unspecified: Secondary | ICD-10-CM | POA: Diagnosis present

## 2015-10-24 DIAGNOSIS — I4581 Long QT syndrome: Secondary | ICD-10-CM | POA: Diagnosis not present

## 2015-10-24 DIAGNOSIS — Z809 Family history of malignant neoplasm, unspecified: Secondary | ICD-10-CM

## 2015-10-24 DIAGNOSIS — E032 Hypothyroidism due to medicaments and other exogenous substances: Secondary | ICD-10-CM | POA: Diagnosis not present

## 2015-10-24 DIAGNOSIS — M069 Rheumatoid arthritis, unspecified: Secondary | ICD-10-CM | POA: Diagnosis not present

## 2015-10-24 DIAGNOSIS — Z830 Family history of human immunodeficiency virus [HIV] disease: Secondary | ICD-10-CM

## 2015-10-24 DIAGNOSIS — D638 Anemia in other chronic diseases classified elsewhere: Secondary | ICD-10-CM | POA: Insufficient documentation

## 2015-10-24 DIAGNOSIS — Z7952 Long term (current) use of systemic steroids: Secondary | ICD-10-CM | POA: Diagnosis not present

## 2015-10-24 DIAGNOSIS — F17200 Nicotine dependence, unspecified, uncomplicated: Secondary | ICD-10-CM | POA: Diagnosis present

## 2015-10-24 DIAGNOSIS — E079 Disorder of thyroid, unspecified: Secondary | ICD-10-CM | POA: Diagnosis present

## 2015-10-24 LAB — TROPONIN I: Troponin I: <0.03 ng/mL (ref <0.031)

## 2015-10-24 LAB — URINALYSIS, ROUTINE W REFLEX MICROSCOPIC
Glucose, UA: NEGATIVE mg/dL
Hgb urine dipstick: NEGATIVE
Ketones, ur: NEGATIVE mg/dL
Nitrite: NEGATIVE
Protein, ur: 30 mg/dL — AB
Specific Gravity, Urine: 1.012 (ref 1.005–1.030)
pH: 5.5 (ref 5.0–8.0)

## 2015-10-24 LAB — COMPREHENSIVE METABOLIC PANEL
ALT: 24 U/L (ref 14–54)
AST: 46 U/L — ABNORMAL HIGH (ref 15–41)
Albumin: 3.3 g/dL — ABNORMAL LOW (ref 3.5–5.0)
Alkaline Phosphatase: 82 U/L (ref 38–126)
Anion gap: 10 (ref 5–15)
BUN: 5 mg/dL — ABNORMAL LOW (ref 6–20)
CO2: 23 mmol/L (ref 22–32)
Calcium: 9.3 mg/dL (ref 8.9–10.3)
Chloride: 104 mmol/L (ref 101–111)
Creatinine, Ser: 0.85 mg/dL (ref 0.44–1.00)
GFR calc Af Amer: >60 mL/min (ref >60)
GFR calc non Af Amer: >60 mL/min (ref >60)
Glucose, Bld: 149 mg/dL — ABNORMAL HIGH (ref 65–99)
Potassium: 3.4 mmol/L — ABNORMAL LOW (ref 3.5–5.1)
Sodium: 137 mmol/L (ref 135–145)
Total Bilirubin: 4 mg/dL — ABNORMAL HIGH (ref 0.3–1.2)
Total Protein: 7.4 g/dL (ref 6.5–8.1)

## 2015-10-24 LAB — TYPE AND SCREEN
ABO/RH(D): B POS
Antibody Screen: NEGATIVE
Unit division: 0

## 2015-10-24 LAB — CBC WITH DIFFERENTIAL/PLATELET
Basophils Absolute: 0 10*3/uL (ref 0.0–0.1)
Basophils Relative: 0 %
Eosinophils Absolute: 0.3 10*3/uL (ref 0.0–0.7)
Eosinophils Relative: 2 %
HCT: 16.7 % — ABNORMAL LOW (ref 36.0–46.0)
Hemoglobin: 5.6 g/dL — CL (ref 12.0–15.0)
Lymphocytes Relative: 20 %
Lymphs Abs: 3.3 10*3/uL (ref 0.7–4.0)
MCH: 23.6 pg — ABNORMAL LOW (ref 26.0–34.0)
MCHC: 33.5 g/dL (ref 30.0–36.0)
MCV: 70.5 fL — ABNORMAL LOW (ref 78.0–100.0)
Monocytes Absolute: 1.7 10*3/uL — ABNORMAL HIGH (ref 0.1–1.0)
Monocytes Relative: 10 %
Neutro Abs: 11.2 10*3/uL — ABNORMAL HIGH (ref 1.7–7.7)
Neutrophils Relative %: 68 %
Platelets: 197 10*3/uL (ref 150–400)
RBC: 2.37 MIL/uL — ABNORMAL LOW (ref 3.87–5.11)
RDW: 33.2 % — ABNORMAL HIGH (ref 11.5–15.5)
WBC: 16.5 10*3/uL — ABNORMAL HIGH (ref 4.0–10.5)

## 2015-10-24 LAB — URINE MICROSCOPIC-ADD ON: RBC / HPF: NONE SEEN RBC/hpf (ref 0–5)

## 2015-10-24 LAB — MAGNESIUM: Magnesium: 2.2 mg/dL (ref 1.7–2.4)

## 2015-10-24 LAB — BRAIN NATRIURETIC PEPTIDE: B Natriuretic Peptide: 66.5 pg/mL (ref 0.0–100.0)

## 2015-10-24 LAB — PREPARE RBC (CROSSMATCH)

## 2015-10-24 LAB — RETICULOCYTES
RBC.: 2.37 MIL/uL — ABNORMAL LOW (ref 3.87–5.11)
Retic Count, Absolute: 429 10*3/uL — ABNORMAL HIGH (ref 19.0–186.0)
Retic Ct Pct: 18.1 % — ABNORMAL HIGH (ref 0.4–3.1)

## 2015-10-24 LAB — LACTATE DEHYDROGENASE: LDH: 565 U/L — ABNORMAL HIGH (ref 98–192)

## 2015-10-24 LAB — TSH: TSH: 4.617 u[IU]/mL — ABNORMAL HIGH (ref 0.350–4.500)

## 2015-10-24 LAB — ABO/RH: ABO/RH(D): B POS

## 2015-10-24 MED ORDER — HYDROMORPHONE HCL 1 MG/ML IJ SOLN
0.0250 mg/kg | INTRAMUSCULAR | Status: AC
  Administered 2015-10-24: 1.65 mg via INTRAVENOUS
  Filled 2015-10-24: qty 2

## 2015-10-24 MED ORDER — HYDROMORPHONE 1 MG/ML IV SOLN
INTRAVENOUS | Status: DC
  Administered 2015-10-24: 10:00:00 via INTRAVENOUS
  Administered 2015-10-24: 0.3 mg via INTRAVENOUS
  Administered 2015-10-24: 0.6 mg via INTRAVENOUS
  Administered 2015-10-25 (×2): 0.9 mg via INTRAVENOUS
  Administered 2015-10-26: 0.6 mg via INTRAVENOUS
  Administered 2015-10-26: 0.9 mg via INTRAVENOUS
  Filled 2015-10-24: qty 25

## 2015-10-24 MED ORDER — ENOXAPARIN SODIUM 40 MG/0.4ML ~~LOC~~ SOLN
40.0000 mg | SUBCUTANEOUS | Status: DC
  Administered 2015-10-24 – 2015-10-26 (×3): 40 mg via SUBCUTANEOUS
  Filled 2015-10-24 (×3): qty 0.4

## 2015-10-24 MED ORDER — NALOXONE HCL 0.4 MG/ML IJ SOLN: 0.4000 mg | INTRAMUSCULAR | Status: DC | PRN

## 2015-10-24 MED ORDER — ONDANSETRON HCL 4 MG PO TABS: 4.0000 mg | ORAL_TABLET | ORAL | Status: DC | PRN

## 2015-10-24 MED ORDER — AZITHROMYCIN 250 MG PO TABS
500.0000 mg | ORAL_TABLET | Freq: Once | ORAL | Status: AC
  Administered 2015-10-24: 500 mg via ORAL
  Filled 2015-10-24: qty 2

## 2015-10-24 MED ORDER — CETYLPYRIDINIUM CHLORIDE 0.05 % MT LIQD
7.0000 mL | Freq: Two times a day (BID) | OROMUCOSAL | Status: DC
  Administered 2015-10-24 – 2015-10-25 (×4): 7 mL via OROMUCOSAL

## 2015-10-24 MED ORDER — ONDANSETRON HCL 4 MG/2ML IJ SOLN
4.0000 mg | INTRAMUSCULAR | Status: DC | PRN
  Administered 2015-10-24: 4 mg via INTRAVENOUS
  Filled 2015-10-24: qty 2

## 2015-10-24 MED ORDER — HYDROMORPHONE HCL 1 MG/ML IJ SOLN
1.0000 mg | INTRAMUSCULAR | Status: DC | PRN
Start: 2015-10-24 — End: 2015-10-24
  Administered 2015-10-24: 1 mg via INTRAVENOUS
  Filled 2015-10-24: qty 1

## 2015-10-24 MED ORDER — AZITHROMYCIN 250 MG PO TABS
250.0000 mg | ORAL_TABLET | Freq: Every day | ORAL | Status: DC
  Administered 2015-10-25 – 2015-10-26 (×2): 250 mg via ORAL
  Filled 2015-10-24 (×3): qty 1

## 2015-10-24 MED ORDER — SODIUM CHLORIDE 0.9 % IV SOLN
Freq: Once | INTRAVENOUS | Status: AC
  Administered 2015-10-24: 18:00:00 via INTRAVENOUS

## 2015-10-24 MED ORDER — SODIUM CHLORIDE 0.9 % IV SOLN
25.0000 mg | INTRAVENOUS | Status: DC | PRN
  Filled 2015-10-24: qty 0.5

## 2015-10-24 MED ORDER — DIPHENHYDRAMINE HCL 25 MG PO CAPS: 25.0000 mg | ORAL_CAPSULE | ORAL | Status: DC | PRN

## 2015-10-24 MED ORDER — ALBUTEROL SULFATE (2.5 MG/3ML) 0.083% IN NEBU: 2.5000 mg | INHALATION_SOLUTION | RESPIRATORY_TRACT | Status: DC | PRN

## 2015-10-24 MED ORDER — HYDROMORPHONE HCL 1 MG/ML IJ SOLN: 0.0250 mg/kg | INTRAMUSCULAR | Status: AC

## 2015-10-24 MED ORDER — SODIUM CHLORIDE 0.9 % IV SOLN: 10.0000 mL/h | Freq: Once | INTRAVENOUS | Status: DC

## 2015-10-24 MED ORDER — DIPHENHYDRAMINE HCL 12.5 MG/5ML PO ELIX: 12.5000 mg | ORAL_SOLUTION | Freq: Four times a day (QID) | ORAL | Status: DC | PRN

## 2015-10-24 MED ORDER — ONDANSETRON HCL 4 MG/2ML IJ SOLN
4.0000 mg | INTRAMUSCULAR | Status: DC | PRN
  Administered 2015-10-24: 4 mg via INTRAVENOUS

## 2015-10-24 MED ORDER — ONDANSETRON HCL 4 MG/2ML IJ SOLN
4.0000 mg | Freq: Four times a day (QID) | INTRAMUSCULAR | Status: DC | PRN
Start: 2015-10-24 — End: 2015-10-26
  Filled 2015-10-24: qty 2

## 2015-10-24 MED ORDER — SENNOSIDES-DOCUSATE SODIUM 8.6-50 MG PO TABS
1.0000 | ORAL_TABLET | Freq: Two times a day (BID) | ORAL | Status: DC
  Administered 2015-10-24 – 2015-10-26 (×5): 1 via ORAL
  Filled 2015-10-24 (×6): qty 1

## 2015-10-24 MED ORDER — DIPHENHYDRAMINE HCL 50 MG/ML IJ SOLN: 12.5000 mg | Freq: Four times a day (QID) | INTRAMUSCULAR | Status: DC | PRN

## 2015-10-24 MED ORDER — GUAIFENESIN-DM 100-10 MG/5ML PO SYRP
5.0000 mL | ORAL_SOLUTION | ORAL | Status: DC | PRN
  Administered 2015-10-24 – 2015-10-25 (×3): 5 mL via ORAL
  Filled 2015-10-24 (×3): qty 10

## 2015-10-24 MED ORDER — CHLORHEXIDINE GLUCONATE 0.12 % MT SOLN
15.0000 mL | Freq: Two times a day (BID) | OROMUCOSAL | Status: DC
  Administered 2015-10-24 – 2015-10-26 (×5): 15 mL via OROMUCOSAL
  Filled 2015-10-24 (×5): qty 15

## 2015-10-24 MED ORDER — OXYCODONE HCL 5 MG PO TABS
5.0000 mg | ORAL_TABLET | Freq: Four times a day (QID) | ORAL | Status: DC
  Administered 2015-10-24 – 2015-10-26 (×8): 5 mg via ORAL
  Filled 2015-10-24 (×8): qty 1

## 2015-10-24 MED ORDER — KETOROLAC TROMETHAMINE 30 MG/ML IJ SOLN
30.0000 mg | INTRAMUSCULAR | Status: AC
  Administered 2015-10-24: 30 mg via INTRAVENOUS
  Filled 2015-10-24: qty 1

## 2015-10-24 MED ORDER — LEVOTHYROXINE SODIUM 50 MCG PO TABS
50.0000 ug | ORAL_TABLET | Freq: Every day | ORAL | Status: DC
  Administered 2015-10-24 – 2015-10-26 (×3): 50 ug via ORAL
  Filled 2015-10-24 (×4): qty 1

## 2015-10-24 MED ORDER — KETOROLAC TROMETHAMINE 15 MG/ML IJ SOLN
15.0000 mg | Freq: Four times a day (QID) | INTRAMUSCULAR | Status: DC
  Administered 2015-10-24 – 2015-10-25 (×5): 15 mg via INTRAVENOUS
  Filled 2015-10-24 (×5): qty 1

## 2015-10-24 MED ORDER — PROMETHAZINE HCL 25 MG PO TABS
25.0000 mg | ORAL_TABLET | Freq: Four times a day (QID) | ORAL | Status: DC | PRN
Start: 2015-10-24 — End: 2015-10-26

## 2015-10-24 MED ORDER — SODIUM CHLORIDE 0.9% FLUSH: 9.0000 mL | INTRAVENOUS | Status: DC | PRN

## 2015-10-24 MED ORDER — POLYETHYLENE GLYCOL 3350 17 G PO PACK
17.0000 g | PACK | Freq: Every day | ORAL | Status: DC | PRN
  Administered 2015-10-25: 17 g via ORAL
  Filled 2015-10-24: qty 1

## 2015-10-24 MED ORDER — FOLIC ACID 1 MG PO TABS
1.0000 mg | ORAL_TABLET | Freq: Every day | ORAL | Status: DC
  Administered 2015-10-24 – 2015-10-26 (×3): 1 mg via ORAL
  Filled 2015-10-24 (×3): qty 1

## 2015-10-24 MED ORDER — POTASSIUM CHLORIDE CRYS ER 20 MEQ PO TBCR
40.0000 meq | EXTENDED_RELEASE_TABLET | Freq: Once | ORAL | Status: AC
  Administered 2015-10-24: 40 meq via ORAL
  Filled 2015-10-24: qty 2

## 2015-10-24 NOTE — Progress Notes (Signed)
SICKLE CELL SERVICE PROGRESS NOTE  [name] ZOX:[id number] DOB: [birth year] DOA: 10/24/2015 PCP: Doreatha Martin, MD  Assessment/Plan: Active Problems:   Hypothyroidism   Rheumatoid arthritis (HCC)   Hepatitis C   Sickle cell crisis (HCC)   Acute sickle cell crisis (HCC)  1. Vomiting: Pt reports that she had one episode of emesis today. She is currently attempting to try diet. If emesis persists will obtain an abdominal plain film 2. Hb SS with crisis: Pt states that her pain is at her baseline of 7/10. Will place patient on oxycodone 5 mg every 6 hours on scheduled basis and continue to PCA on a when necessary basis. 3. Leukocytosis: Pt has a baseline leukocytosis and does have evidence of an acute bronchitis. Will treat with Azithromycin. 4. Acute bronchitis: She has been struggling with acute bronchitis for several days. We'll go ahead and treat with Zithromax. He currently has no evidence of wheezing however will order albuterol to be used on as-needed basis. 5. Prolonged QTc: QT interval minimally prolonged. We'll continue to monitor and check another EKG approximately 48 hours after initiating azithromycin. 6. Anemia of chronic disease: Patient has a baseline hemoglobin of approximately 6 g/dL. She is currently below baseline and at marginal levels of hemoglobin. Will transfuse 1 unit of rRBC's and reassess. 7. Rheumatoid Arthritis: Pt had been on Remicade but has not taken it since 09/2014 as it was no longer covered by Medicaid. 8. Hypothyroidism: Pt has a very mild elevation in TSH. Will continue current dose of Synthroid.  9. Proteinuria: Patient is noted to have protein in her urine. This is likely an effect of her sickle cell but could also be related to rheumatoid arthritis. I recommend that she has this followed up as an outpatient for consideration of an ACE inhibitor for renal protection.  Code Status: Full Code Family Communication: N/A Disposition Plan: Not yet ready for  discharge  [name] A.  Pager [phone]. If 7PM-7AM, please contact night-coverage.  10/24/2015, 12:43 PM  LOS: 0 days   Interim History: Pt reports that her symptoms of diarrhea x 5 days that ended 5 days ago. She also had some vomiting for a few episodes which ended last week. Both of these symptoms were self limiting and were treated symptomatically. She states that at about the same time she had these symptoms she started having a throbbing pain in the chest region. She states that the pain was distributed in the chest wall and rib cage and is sharp and throbbing in nature. It was 10/10 on admission and is now 7/10 which is her baseline level. She reports that her baseline Hb is 7 g.dl, however a review of her records shows that her baseline Hb is 6 g/dl. It was 6.8 g/dl at her last visit to her PMD's office during which she was volume depleted as reflected by her elevated BUN and Cr. Pt has also had a cough for about 2 weeks productive of yellowish sputum.She denies being a habitual smoker but does smoke occasionally.     Consultants:  None  Procedures:  None  Antibiotics:  None   Objective: Filed Vitals:   10/24/15 0730 10/24/15 0748 10/24/15 0900 10/24/15 1006  BP: 126/71 120/70 103/54   Pulse: 59 70 64   Temp:  97.8 F (36.6 C) 97.4 F (36.3 C)   TempSrc:   Oral   Resp:  12 16 16   Height:   5\' 11"  (1.803 m)   Weight:   144 lb 3.2 oz (  65.409 kg)   SpO2: 98% 100% 99% 95%   Weight change:  No intake or output data in the 24 hours ending 10/24/15 1243  General: Alert, awake, oriented x3, in mild distress.  HEENT: [place]/AT PEERL, EOMI, anicteric Neck: Trachea midline,  no masses, no thyromegal,y no JVD, no carotid bruit OROPHARYNX:  Moist, No exudate/ erythema/lesions.  Heart: Regular rate and rhythm, without murmurs, rubs, gallops, PMI non-displaced, no heaves or thrills on palpation.  Lungs: Clear to auscultation, no wheezing or rhonchi noted. No increased vocal  fremitus resonant to percussion  Abdomen: Soft, nontender, nondistended, positive bowel sounds, no masses no hepatosplenomegaly noted..  Neuro: No focal neurological deficits noted cranial nerves II through XII grossly intact.  Strength functional baseline in bilateral upper and lower extremities. Musculoskeletal: No warm swelling or erythema around joints, no spinal tenderness noted. Psychiatric: Patient alert and oriented x3, good insight and cognition, good recent to remote recall.    Data Reviewed: Basic Metabolic Panel:  Recent Labs Lab 10/24/15 0342 10/24/15 0936  NA 137  --   K 3.4*  --   CL 104  --   CO2 23  --   GLUCOSE 149*  --   BUN 5*  --   CREATININE 0.85  --   CALCIUM 9.3  --   MG  --  2.2   Liver Function Tests:  Recent Labs Lab 10/24/15 0342  AST 46*  ALT 24  ALKPHOS 82  BILITOT 4.0*  PROT 7.4  ALBUMIN 3.3*   No results for input(s): LIPASE, AMYLASE in the last 168 hours. No results for input(s): AMMONIA in the last 168 hours. CBC:  Recent Labs Lab 10/24/15 0342  WBC 16.5*  NEUTROABS 11.2*  HGB 5.6*  HCT 16.7*  MCV 70.5*  PLT 197   Cardiac Enzymes:  Recent Labs Lab 10/24/15 0342  TROPONINI <0.03   BNP (last 3 results)  Recent Labs  10/24/15 0343  BNP 66.5    ProBNP (last 3 results) No results for input(s): PROBNP in the last 8760 hours.  CBG: No results for input(s): GLUCAP in the last 168 hours.  No results found for this or any previous visit (from the past 240 hour(s)).   Studies: Dg Chest 2 View  10/24/2015  CLINICAL DATA:  Chest pain EXAM: CHEST  2 VIEW COMPARISON:  05/05/2014 FINDINGS: Cardiac shadow is at the upper limits of normal in size but stable. The lungs are well aerated bilaterally. No focal infiltrate or sizable effusion is seen. No acute bony abnormality is noted. IMPRESSION: No acute abnormality noted. Electronically Signed   By: Alcide Clever M.D.   On: 10/24/2015 03:57    Scheduled Meds: . enoxaparin  (LOVENOX) injection  40 mg Subcutaneous Q24H  . folic acid  1 mg Oral Daily  . HYDROmorphone   Intravenous 6 times per day  . ketorolac  15 mg Intravenous 4 times per day  . levothyroxine  50 mcg Oral QAC breakfast  . potassium chloride  40 mEq Oral Once  . senna-docusate  1 tablet Oral BID   Continuous Infusions:   Time spent 40 minutes. More than 50% of time spent in discussion, counseling, and coordination of care

## 2015-10-24 NOTE — ED Notes (Signed)
[facility] contacted for transport  

## 2015-10-24 NOTE — ED Notes (Signed)
[facility] called for transport. 

## 2015-10-24 NOTE — ED Notes (Signed)
Brought by EMS from home for central chest pain that radiates "all over the chest" at times.  Been going on for a week or more.  Reports taking tramadol, oxycontin, and ibuprofen with no relief.  Hx of sickle cell.  Received Asa 324mg  enroute and ntg .4 sl.

## 2015-10-24 NOTE — ED Notes (Signed)
Pt taken to xray 

## 2015-10-24 NOTE — ED Notes (Signed)
Consent signed and at bedside for blood transfusion. 

## 2015-10-24 NOTE — ED Provider Notes (Signed)
CSN: [id number]     Arrival date & time 10/24/15  0305 History  By signing my name below, I, Linus Galas, attest that this documentation has been prepared under the direction and in the presence of Gilda Crease, MD. Electronically Signed: Linus Galas, ED Scribe. 10/24/2015. 3:21 AM.   Chief Complaint  Patient presents with  . Chest Pain   The history is provided by the patient. No language interpreter was used.   HPI Comments: [name] is a 55 y.o. female brought by to the Emergency Department with a PMHx of Sickle Cell Anemia complaining of chest pain that began 1 week ago. Pt also reports cough. She reports pain with inspiration. Pt denies fever,  SOB, or any other symptoms at this time.   Past Medical History  Diagnosis Date  . Sickle cell anemia (HCC)   . Hypothyroidism   . Pneumonia   . Anxiety   . Arthritis   . Ulcers of both lower extremities (HCC)   . Hypokalemia   . Aneurysm (HCC)   . Colon ulcer    Past Surgical History  Procedure Laterality Date  . Cerebral aneurysm repair    . Ankle surgery  1989 and 1990  . Esophagogastroduodenoscopy  05/12/2012    Procedure: ESOPHAGOGASTRODUODENOSCOPY (EGD);  Surgeon: Hart Carwin, MD;  Location: [facility];  Service: Endoscopy;  Laterality: N/A;  . Colonoscopy  05/12/2012    Procedure: COLONOSCOPY;  Surgeon: Hart Carwin, MD;  Location: [facility];  Service: Endoscopy;  Laterality: N/A;  . Givens capsule study  05/13/2012    Procedure: GIVENS CAPSULE STUDY;  Surgeon: Hart Carwin, MD;  Location: [facility];  Service: Endoscopy;  Laterality: N/A;   Family History  Problem Relation Age of Onset  . Cancer Mother 25    Esophageal  . Sickle cell trait Mother   . Sickle cell trait Father   . HIV/AIDS Brother    Social History  Substance Use Topics  . Smoking status: Never Smoker   . Smokeless tobacco: None  . Alcohol Use: No   OB History    No data available     Review of Systems   Constitutional: Negative for fever.  Respiratory: Positive for cough. Negative for shortness of breath.   Cardiovascular: Positive for chest pain.  All other systems reviewed and are negative.     Allergies  Ampicillin and Demerol  Home Medications   Prior to Admission medications   Medication Sig Start Date End Date Taking? Authorizing Provider  Ascorbic Acid (VITAMIN C PO) Take 500 mg elemental calcium/kg/hr by mouth daily.   Yes Historical Provider, MD  dexlansoprazole (DEXILANT) 60 MG capsule Take 1 capsule (60 mg total) by mouth daily. 05/16/12  Yes Gwenyth Bender, MD  fish oil-omega-3 fatty acids 1000 MG capsule Take 1 g by mouth daily.   Yes Historical Provider, MD  folic acid (FOLVITE) 800 MCG tablet Take 0.5 tablets (400 mcg total) by mouth daily. 11/30/11  Yes Keitha Butte, NP  ibuprofen (ADVIL,MOTRIN) 800 MG tablet Take 800 mg by mouth every 8 (eight) hours as needed for pain.   Yes Historical Provider, MD  levothyroxine (SYNTHROID, LEVOTHROID) 50 MCG tablet Take 50 mcg by mouth daily before breakfast.   Yes Historical Provider, MD  predniSONE (DELTASONE) 10 MG tablet Take 1 tablet (10 mg total) by mouth daily. 12/16/12  Yes Altha Harm, MD  promethazine (PHENERGAN) 25 MG tablet Take 1 tablet (25 mg total) by mouth every  6 (six) hours as needed for nausea or vomiting. 05/05/14  Yes Renne Crigler, PA-C  traMADol (ULTRAM) 50 MG tablet Take 50 mg by mouth every 6 (six) hours as needed for moderate pain.   Yes Historical Provider, MD   BP 119/67 mmHg  Pulse 76  Temp(Src) 98.7 F (37.1 C) (Oral)  Resp 16  Ht 5\' 11"  (1.803 m)  Wt 144 lb (65.318 kg)  BMI 20.09 kg/m2  SpO2 96% Physical Exam  Constitutional: She is oriented to person, place, and time. She appears well-developed and well-nourished. No distress.  HENT:  Head: Normocephalic and atraumatic.  Right Ear: Hearing normal.  Left Ear: Hearing normal.  Nose: Nose normal.  Mouth/Throat: Oropharynx is clear  and moist and mucous membranes are normal.  Eyes: Conjunctivae and EOM are normal. Pupils are equal, round, and reactive to light.  Neck: Normal range of motion. Neck supple.  Cardiovascular: Regular rhythm, S1 normal and S2 normal.  Exam reveals no gallop and no friction rub.   No murmur heard. Pulmonary/Chest: Effort normal and breath sounds normal. No respiratory distress. She exhibits no tenderness.  Abdominal: Soft. Normal appearance and bowel sounds are normal. There is no hepatosplenomegaly. There is no tenderness. There is no rebound, no guarding, no tenderness at McBurney's point and negative Murphy's sign. No hernia.  Musculoskeletal: Normal range of motion.  Neurological: She is alert and oriented to person, place, and time. She has normal strength. No cranial nerve deficit or sensory deficit. Coordination normal. GCS eye subscore is 4. GCS verbal subscore is 5. GCS motor subscore is 6.  Skin: Skin is warm, dry and intact. No rash noted. No cyanosis.  Psychiatric: She has a normal mood and affect. Her speech is normal and behavior is normal. Thought content normal.  Nursing note and vitals reviewed.   ED Course  Procedures   DIAGNOSTIC STUDIES: Oxygen Saturation is 96% on room air, normal by my interpretation.    COORDINATION OF CARE: 3:19 AM Will order pain medication, antiemetic, and Benadryl. Will order CXR, EKG, and blood work.  Discussed treatment plan with pt at bedside and pt agreed to plan.  Labs Review Labs Reviewed  CBC WITH DIFFERENTIAL/PLATELET - Abnormal; Notable for the following:    WBC 16.5 (*)    RBC 2.37 (*)    Hemoglobin 5.6 (*)    HCT 16.7 (*)    MCV 70.5 (*)    MCH 23.6 (*)    RDW 33.2 (*)    Neutro Abs 11.2 (*)    Monocytes Absolute 1.7 (*)    All other components within normal limits  COMPREHENSIVE METABOLIC PANEL - Abnormal; Notable for the following:    Potassium 3.4 (*)    Glucose, Bld 149 (*)    BUN 5 (*)    Albumin 3.3 (*)    AST 46 (*)     Total Bilirubin 4.0 (*)    All other components within normal limits  RETICULOCYTES - Abnormal; Notable for the following:    Retic Ct Pct 18.1 (*)    RBC. 2.37 (*)    Retic Count, Manual 429.0 (*)    All other components within normal limits  TROPONIN I  BRAIN NATRIURETIC PEPTIDE  TYPE AND SCREEN  PREPARE RBC (CROSSMATCH)   Imaging Review Dg Chest 2 View  10/24/2015  CLINICAL DATA:  Chest pain EXAM: CHEST  2 VIEW COMPARISON:  05/05/2014 FINDINGS: Cardiac shadow is at the upper limits of normal in size but stable. The lungs are  well aerated bilaterally. No focal infiltrate or sizable effusion is seen. No acute bony abnormality is noted. IMPRESSION: No acute abnormality noted. Electronically Signed   By: Alcide Clever M.D.   On: 10/24/2015 03:57   I have personally reviewed and evaluated these images and lab results as part of my medical decision-making.   EKG Interpretation   Date/Time:  Monday October 24 2015 03:09:18 EDT Ventricular Rate:  83 PR Interval:  168 QRS Duration: 95 QT Interval:  406 QTC Calculation: 477 R Axis:   54 Text Interpretation:  Sinus rhythm Consider left atrial enlargement  Borderline low voltage, extremity leads Nonspecific T abnormalities,  lateral leads Confirmed by POLLINA  MD, CHRISTOPHER ([id number]) on 10/24/2015  3:15:53 AM      MDM   Final diagnoses:  Acute sickle cell crisis [facility])    Patient presents to the emergency department for evaluation of chest pain. Patient reports that she has been experiencing pain in her chest for approximately a week. She has been taking her ibuprofen, OxyContin and tramadol but not had any relief. Patient does have a history of sickle cell disease. It is suspected that her chest pain is secondary to sickle cell disease, but will require cardiac rule out. Her initial EKG shows nonspecific T-wave abnormalities in the lateral leads, no infarct noted. Initial troponin was negative.  Patient noted to be very anemic. It  appears that her baseline is around 7, hemoglobin is 5.6 today. She will require transfusion. Patient is not hypoxic or tachycardic at this time. Chest x-ray is clear, no evidence of infarct or infiltrate. Doubt acute chest syndrome.  I personally performed the services described in this documentation, which was scribed in my presence. The recorded information has been reviewed and is accurate.   Gilda Crease, MD 10/24/15 [phone]

## 2015-10-24 NOTE — ED Notes (Addendum)
Dr.Danford to see pt prior to transport; [facility] transport placed on hold

## 2015-10-24 NOTE — H&P (Signed)
History and Physical  Patient Name: [name]     NHA:[id number]    DOB: 1961-02-21    DOA: 10/24/2015 Referring physician: Dutch Quint, MD PCP: No PCP Per Patient      Chief Complaint: Chest pain  HPI: [name] is a 55 y.o. female with a past medical history significant for sickle cell disease, RA, and hypothyroidism who presents with chest pain.  The patient was in her usual state of health until one week ago when she developed her symptoms, cough, and emesis. She was seen by a doctor who prescribed supportive cares, and 2 days later she started to develop sharp sternal and rib pain, moderate to severe in intensity, and radiating around the ribs, worse with inspiration. She hasn't had pain like this during pain crisis in the past, but never this severe. She decided taking ibuprofen which didn't help, and then took oxycodone which also didn't help. She denies fever, purulent sputum, dyspnea. She has noticed increased fatigue with exertion this week.  In the ED, she was afebrile, hemodynamically stable, and saturating well on ambient air.  CXR negative.  K 3.4, Cr 0.8, TBili 4.0, WBC 16.5K, Hgb 5.6 (her baseline appears to be in the 6-8 range).  Troponin negative.  Hydromorphone was administered and TRH were asked to evaluate for admission for sickle cell pain crisis.     Review of Systems:  Pt complains of chest pain, bronchitis, fatigue, weakness. All other systems negative except as just noted or noted in the history of present illness.  Allergies  Allergen Reactions  . Ampicillin     Per patient, she tolerates cephalosporins without problems  . Demerol [Meperidine] Other (See Comments)    unknow    Prior to Admission medications   Medication Sig Start Date End Date Taking? Authorizing Provider  Ascorbic Acid (VITAMIN C PO) Take 500 mg elemental calcium/kg/hr by mouth daily.   Yes Historical Provider, MD  dexlansoprazole (DEXILANT) 60 MG capsule Take 1 capsule (60 mg total)  by mouth daily. 05/16/12  Yes Gwenyth Bender, MD  fish oil-omega-3 fatty acids 1000 MG capsule Take 1 g by mouth daily.   Yes Historical Provider, MD  folic acid (FOLVITE) 800 MCG tablet Take 0.5 tablets (400 mcg total) by mouth daily. 11/30/11  Yes Keitha Butte, NP  ibuprofen (ADVIL,MOTRIN) 800 MG tablet Take 800 mg by mouth every 8 (eight) hours as needed for pain.   Yes Historical Provider, MD  levothyroxine (SYNTHROID, LEVOTHROID) 50 MCG tablet Take 50 mcg by mouth daily before breakfast.   Yes Historical Provider, MD  predniSONE (DELTASONE) 10 MG tablet Take 1 tablet (10 mg total) by mouth daily. 12/16/12  Yes Altha Harm, MD  promethazine (PHENERGAN) 25 MG tablet Take 1 tablet (25 mg total) by mouth every 6 (six) hours as needed for nausea or vomiting. 05/05/14  Yes Renne Crigler, PA-C  traMADol (ULTRAM) 50 MG tablet Take 50 mg by mouth every 6 (six) hours as needed for moderate pain.   Yes Historical Provider, MD    Past Medical History  Diagnosis Date  . Sickle cell anemia (HCC)   . Hypothyroidism   . Pneumonia   . Anxiety   . Arthritis   . Ulcers of both lower extremities (HCC)   . Hypokalemia   . Aneurysm (HCC)   . Colon ulcer     Past Surgical History  Procedure Laterality Date  . Cerebral aneurysm repair    . Ankle surgery  1989 and  1990  . Esophagogastroduodenoscopy  05/12/2012    Procedure: ESOPHAGOGASTRODUODENOSCOPY (EGD);  Surgeon: Hart Carwin, MD;  Location: [facility];  Service: Endoscopy;  Laterality: N/A;  . Colonoscopy  05/12/2012    Procedure: COLONOSCOPY;  Surgeon: Hart Carwin, MD;  Location: [facility];  Service: Endoscopy;  Laterality: N/A;  . Givens capsule study  05/13/2012    Procedure: GIVENS CAPSULE STUDY;  Surgeon: Hart Carwin, MD;  Location: [facility];  Service: Endoscopy;  Laterality: N/A;    Family history: family history includes Cancer (age of onset: 75) in her mother; HIV/AIDS in her brother; Sickle cell trait in her  father and mother.  Social History: Patient lives with family.  She  reports that she has never smoked. She does not have any smokeless tobacco history on file. She reports that she does not drink alcohol or use illicit drugs.     Physical Exam: BP 121/61 mmHg  Pulse 61  Temp(Src) 97.8 F (36.6 C) (Oral)  Resp 16  Ht 5\' 11"  (1.803 m)  Wt 65.318 kg (144 lb)  BMI 20.09 kg/m2  SpO2 100% General appearance: Well-developed, adult female, alert and in moderate distress from pain.   Eyes: Anicteric, conjunctiva pink, lids and lashes normal.     ENT: No nasal deformity, discharge, or epistaxis.  OP moist without lesions.   Lymph: No cervical or supraclavicular lymphadenopathy. Skin: Warm and dry.   Cardiac: RRR, nl S1-S2, no murmurs appreciated.  Respiratory: Normal respiratory rate and rhythm.  CTAB without rales or wheezes. Abdomen: Abdomen soft without rigidity.  No TTP. No ascites, distension.   MSK: No deformities or effusions.  No pain with palpation over ribs. Neuro: Sensorium intact and responding to questions, attention normal.  Speech is fluent.  Moves all extremities equally and with normal coordination.    Psych: Behavior appropriate.  Affect normal.  No evidence of aural or visual hallucinations or delusions.       Labs on Admission:  The metabolic panel shows hypokalemia, elevated total bilirubin. Troponin normal. Reticulocytes elevated. The complete blood count shows leukocytosis, anemia.   Radiological Exams on Admission: Personally reviewed: Dg Chest 2 View  10/24/2015  CLINICAL DATA:  Chest pain EXAM: CHEST  2 VIEW COMPARISON:  05/05/2014 FINDINGS: Cardiac shadow is at the upper limits of normal in size but stable. The lungs are well aerated bilaterally. No focal infiltrate or sizable effusion is seen. No acute bony abnormality is noted. IMPRESSION: No acute abnormality noted. Electronically Signed   By: Alcide Clever M.D.   On: 10/24/2015 03:57    EKG:  Independently reviewed. Rate 83, sinus rhythm. QTC 477. Repolarization abnormality lateral leads, similar to previous in 2013.    Assessment/Plan 1. Sickle cell crisis:    Patient with chest pain somewhat similar to previous pain crises (these are rare for her), but more intense.  No fever, chills.  No history of CV disease.  Pain present for 5 days and troponin negative, will not cycle, ischemia doubted.  No recent travel, surgery, immobilization, OCPs, tachycardia, leg swelling or hypoxia, PE doubted. -Hydromorphone PCA, naive dosing for now -Sickle cell bundle   2. Anemia:  -Transfuse 1 unit  3. Hypokalemia:  -Supplement once -Check mag  4. Leukocytosis:  Presumed reactive for now  5. Bronchitis:  -Cough syrup PRN  6. Hypothyroidism: Chart history of hypothyroidism.  Does not endorse taking levothyroxine. -Check TSH    DVT PPx: Lovenox Diet: Regular Consultants: None Code Status: Full Family Communication: None  present  Medical decision making: What exists of the patient's previous chart was reviewed in depth and the case was discussed with Dr. Senaida Ores. Patient seen 6:42 AM on 10/24/2015.  Disposition Plan:  I recommend admission to telemetry.  Clinical condition: Stable.  Anticipate IV opiate therapy for sickle cell pain crisis.      Alberteen Sam [facility] Pager [phone]

## 2015-10-25 DIAGNOSIS — E032 Hypothyroidism due to medicaments and other exogenous substances: Secondary | ICD-10-CM

## 2015-10-25 LAB — COMPREHENSIVE METABOLIC PANEL
ALT: 17 U/L (ref 14–54)
AST: 36 U/L (ref 15–41)
Albumin: 3.2 g/dL — ABNORMAL LOW (ref 3.5–5.0)
Alkaline Phosphatase: 91 U/L (ref 38–126)
Anion gap: 8 (ref 5–15)
BUN: 7 mg/dL (ref 6–20)
CO2: 27 mmol/L (ref 22–32)
Calcium: 9 mg/dL (ref 8.9–10.3)
Chloride: 105 mmol/L (ref 101–111)
Creatinine, Ser: 0.83 mg/dL (ref 0.44–1.00)
GFR calc Af Amer: >60 mL/min (ref >60)
GFR calc non Af Amer: >60 mL/min (ref >60)
Glucose, Bld: 95 mg/dL (ref 65–99)
Potassium: 3.8 mmol/L (ref 3.5–5.1)
Sodium: 140 mmol/L (ref 135–145)
Total Bilirubin: 2.9 mg/dL — ABNORMAL HIGH (ref 0.3–1.2)
Total Protein: 7.1 g/dL (ref 6.5–8.1)

## 2015-10-25 LAB — CBC WITH DIFFERENTIAL/PLATELET
Band Neutrophils: 0 %
Basophils Absolute: 0 10*3/uL (ref 0.0–0.1)
Basophils Relative: 0 %
Blasts: 0 %
Eosinophils Absolute: 0.4 10*3/uL (ref 0.0–0.7)
Eosinophils Relative: 3 %
HCT: 19.2 % — ABNORMAL LOW (ref 36.0–46.0)
Hemoglobin: 6.5 g/dL — CL (ref 12.0–15.0)
Lymphocytes Relative: 21 %
Lymphs Abs: 2.5 10*3/uL (ref 0.7–4.0)
MCH: 25.7 pg — ABNORMAL LOW (ref 26.0–34.0)
MCHC: 33.9 g/dL (ref 30.0–36.0)
MCV: 75.9 fL — ABNORMAL LOW (ref 78.0–100.0)
Metamyelocytes Relative: 0 %
Monocytes Absolute: 1.2 10*3/uL — ABNORMAL HIGH (ref 0.1–1.0)
Monocytes Relative: 10 %
Myelocytes: 0 %
Neutro Abs: 7.6 10*3/uL (ref 1.7–7.7)
Neutrophils Relative %: 66 %
Other: 0 %
Platelets: 183 10*3/uL (ref 150–400)
Promyelocytes Absolute: 0 %
RBC: 2.53 MIL/uL — ABNORMAL LOW (ref 3.87–5.11)
RDW: 30.2 % — ABNORMAL HIGH (ref 11.5–15.5)
WBC: 11.7 10*3/uL — ABNORMAL HIGH (ref 4.0–10.5)
nRBC: 90 /100 WBC — ABNORMAL HIGH

## 2015-10-25 LAB — TYPE AND SCREEN
ABO/RH(D): B POS
Antibody Screen: NEGATIVE
Unit division: 0

## 2015-10-25 LAB — RETICULOCYTES
RBC.: 2.53 MIL/uL — ABNORMAL LOW (ref 3.87–5.11)
Retic Count, Absolute: 440.2 10*3/uL — ABNORMAL HIGH (ref 19.0–186.0)
Retic Ct Pct: 17.4 % — ABNORMAL HIGH (ref 0.4–3.1)

## 2015-10-25 MED ORDER — KETOROLAC TROMETHAMINE 30 MG/ML IJ SOLN
30.0000 mg | Freq: Four times a day (QID) | INTRAMUSCULAR | Status: DC
  Administered 2015-10-25 – 2015-10-26 (×3): 30 mg via INTRAVENOUS
  Filled 2015-10-25 (×3): qty 1

## 2015-10-25 MED ORDER — BENZONATATE 100 MG PO CAPS: 100.0000 mg | ORAL_CAPSULE | Freq: Three times a day (TID) | ORAL | Status: DC | PRN

## 2015-10-25 MED ORDER — BENZONATATE 100 MG PO CAPS
100.0000 mg | ORAL_CAPSULE | Freq: Three times a day (TID) | ORAL | Status: AC
  Administered 2015-10-25 (×3): 100 mg via ORAL
  Filled 2015-10-25 (×3): qty 1

## 2015-10-25 NOTE — Progress Notes (Signed)
SICKLE CELL SERVICE PROGRESS NOTE  [name] ZOX:[id number] DOB: Jul 30, 1960 DOA: 10/24/2015 PCP: Doreatha Martin, MD  Assessment/Plan: Active Problems:   Hypothyroidism   Rheumatoid arthritis (HCC)   Hepatitis C   Sickle cell crisis (HCC)   Acute sickle cell crisis (HCC)   Anemia of chronic disease   Acute bronchitis  1. Vomiting: Resolved. 2. Hb SS with crisis: Pt states that her pain is now 9/10. She has used only 1.2 mg with 4/4:demnads/deliveries as she states that she is afraid to overdose.  Will continue  oxycodone 5 mg every 6 hours on scheduled basis, continue to PCA on a when necessary basis and increase Toradol to 30 mg. Re-assess pain control tomorrow. 3. Leukocytosis:Improved. Will continue Azithromycin. 4. Acute bronchitis: She has been struggling with acute bronchitis for several days. We'll go ahead and treat with Zithromax. He currently has no evidence of wheezing however will order albuterol to be used on as-needed basis. 5. Prolonged QTc: QT interval minimally prolonged. We'll continue to monitor and check another EKG approximately 48 hours after initiating azithromycin. 6. Anemia of chronic disease:Hb improved to 6.5 after transfusion.  7. Rheumatoid Arthritis: Pt had been on Remicade but has not taken it since 09/2014 as it was no longer covered by Medicaid. 8. Hypothyroidism: Pt has a very mild elevation in TSH. Will continue current dose of Synthroid.  9. Proteinuria: Patient is noted to have protein in her urine. This is likely an effect of her sickle cell but could also be related to rheumatoid arthritis. I recommend that she has this followed up as an outpatient for consideration of an ACE inhibitor for renal protection.  Code Status: Full Code Family Communication: N/A Disposition Plan: Not yet ready for discharge  MATTHEWS,MICHELLE A.  Pager [phone]. If 7PM-7AM, please contact night-coverage.  10/25/2015, 11:38 AM  LOS: 1 day   Interim History: Pt  reports that her symptoms of diarrhea x 5 days that ended 5 days ago. She also had some vomiting for a few episodes which ended last week. Both of these symptoms were self limiting and were treated symptomatically. She states that at about the same time she had these symptoms she started having a throbbing pain in the chest region. She states that the pain was distributed in the chest wall and rib cage and is sharp and throbbing in nature. It was 10/10 on admission and is now 7/10 which is her baseline level. She reports that her baseline Hb is 7 g.dl, however a review of her records shows that her baseline Hb is 6 g/dl. It was 6.8 g/dl at her last visit to her PMD's office during which she was volume depleted as reflected by her elevated BUN and Cr. Pt has also had a cough for about 2 weeks productive of yellowish sputum.She denies being a habitual smoker but does smoke occasionally.    Today she reports her pain as 9/10 and localized to her rib cage and back.   Consultants:  None  Procedures:  None  Antibiotics:  None   Objective: Filed Vitals:   10/25/15 0153 10/25/15 0528 10/25/15 0629 10/25/15 0800  BP: 112/60  99/55   Pulse: 56  61   Temp: 98.5 F (36.9 C)  98.4 F (36.9 C)   TempSrc: Oral  Oral   Resp: 11 14 12 11   Height:      Weight:      SpO2: 100% 100% 98% 100%   Weight change: 3.2 oz (0.091 kg)  Intake/Output Summary (  Last 24 hours) at 10/25/15 1138 Last data filed at 10/25/15 0654  Gross per 24 hour  Intake  733.4 ml  Output      2 ml  Net  731.4 ml    General: Alert, awake, oriented x3, in  No apparent distress.  HEENT: [place]/AT PEERL, EOMI, anicteric Neck: Trachea midline,  no masses, no thyromegal,y no JVD, no carotid bruit OROPHARYNX:  Moist, No exudate/ erythema/lesions.  Heart: Regular rate and rhythm, without murmurs, rubs, gallops, PMI non-displaced, no heaves or thrills on palpation.  Lungs: Clear to auscultation, no wheezing or rhonchi noted. No  increased vocal fremitus resonant to percussion  Abdomen: Soft, nontender, nondistended, positive bowel sounds, no masses no hepatosplenomegaly noted.  Neuro: No focal neurological deficits noted cranial nerves II through XII grossly intact.  Strength functional baseline in bilateral upper and lower extremities. Musculoskeletal: No warm swelling or erythema around joints, no spinal tenderness noted. Psychiatric: Patient alert and oriented x3, good insight and cognition, good recent to remote recall.    Data Reviewed: Basic Metabolic Panel:  Recent Labs Lab 10/24/15 0342 10/24/15 0936 10/25/15 0454  NA 137  --  140  K 3.4*  --  3.8  CL 104  --  105  CO2 23  --  27  GLUCOSE 149*  --  95  BUN 5*  --  7  CREATININE 0.85  --  0.83  CALCIUM 9.3  --  9.0  MG  --  2.2  --    Liver Function Tests:  Recent Labs Lab 10/24/15 0342 10/25/15 0454  AST 46* 36  ALT 24 17  ALKPHOS 82 91  BILITOT 4.0* 2.9*  PROT 7.4 7.1  ALBUMIN 3.3* 3.2*   No results for input(s): LIPASE, AMYLASE in the last 168 hours. No results for input(s): AMMONIA in the last 168 hours. CBC:  Recent Labs Lab 10/24/15 0342 10/25/15 0454  WBC 16.5* 11.7*  NEUTROABS 11.2* 7.6  HGB 5.6* 6.5*  HCT 16.7* 19.2*  MCV 70.5* 75.9*  PLT 197 183   Cardiac Enzymes:  Recent Labs Lab 10/24/15 0342  TROPONINI <0.03   BNP (last 3 results)  Recent Labs  10/24/15 0343  BNP 66.5    ProBNP (last 3 results) No results for input(s): PROBNP in the last 8760 hours.  CBG: No results for input(s): GLUCAP in the last 168 hours.  No results found for this or any previous visit (from the past 240 hour(s)).   Studies: Dg Chest 2 View  10/24/2015  CLINICAL DATA:  Chest pain EXAM: CHEST  2 VIEW COMPARISON:  05/05/2014 FINDINGS: Cardiac shadow is at the upper limits of normal in size but stable. The lungs are well aerated bilaterally. No focal infiltrate or sizable effusion is seen. No acute bony abnormality is noted.  IMPRESSION: No acute abnormality noted. Electronically Signed   By: Alcide Clever M.D.   On: 10/24/2015 03:57    Scheduled Meds: . antiseptic oral rinse  7 mL Mouth Rinse q12n4p  . azithromycin  250 mg Oral Daily  . benzonatate  100 mg Oral TID  . chlorhexidine  15 mL Mouth Rinse BID  . enoxaparin (LOVENOX) injection  40 mg Subcutaneous Q24H  . folic acid  1 mg Oral Daily  . HYDROmorphone   Intravenous 6 times per day  . ketorolac  15 mg Intravenous 4 times per day  . levothyroxine  50 mcg Oral QAC breakfast  . oxyCODONE  5 mg Oral Q6H  . senna-docusate  1 tablet Oral BID   Continuous Infusions:   Time spent 25 minutes. More than 50% of time spent in discussion, counseling, and coordination of care

## 2015-10-26 DIAGNOSIS — R112 Nausea with vomiting, unspecified: Secondary | ICD-10-CM

## 2015-10-26 DIAGNOSIS — J200 Acute bronchitis due to Mycoplasma pneumoniae: Secondary | ICD-10-CM

## 2015-10-26 DIAGNOSIS — I4581 Long QT syndrome: Secondary | ICD-10-CM

## 2015-10-26 MED ORDER — TRAMADOL HCL 50 MG PO TABS: 50.0000 mg | ORAL_TABLET | Freq: Four times a day (QID) | ORAL | Status: DC | PRN

## 2015-10-26 MED ORDER — BENZONATATE 100 MG PO CAPS: 100.0000 mg | ORAL_CAPSULE | Freq: Three times a day (TID) | ORAL | Status: DC | PRN

## 2015-10-26 MED ORDER — AZITHROMYCIN 250 MG PO TABS: 250.0000 mg | ORAL_TABLET | Freq: Every day | ORAL | Status: DC

## 2015-10-26 NOTE — Discharge Summary (Signed)
[name] MRN: [id number] DOB/AGE: Jan 22, 1961 55 y.o.  Admit date: 10/24/2015 Discharge date: 10/26/2015  Primary Care Physician:  Doreatha Martin, MD   Discharge Diagnoses:   Patient Active Problem List   Diagnosis Date Noted  . Sickle cell crisis (HCC) 10/24/2015  . Acute sickle cell crisis (HCC) 10/24/2015  . Anemia of chronic disease   . Acute bronchitis   . Hepatitis C 05/11/2014  . 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 Thrombophlebitis leg superficial 03/04/2013  . Cellulitis 12/16/2012  . Rheumatoid arthritis (HCC) 12/16/2012  . Ulceration of intestine 05/12/2012  . Arthritis 11/29/2011  . Hypothyroidism 11/27/2011  . Sickle cell anemia (HCC) 11/26/2011  . Sickle cell anemia with pain (HCC) 11/26/2011  . Decreased appetite 11/26/2011    DISCHARGE MEDICATION:   Medication List    TAKE these medications        azithromycin 250 MG tablet  Commonly known as:  ZITHROMAX  Take 1 tablet (250 mg total) by mouth daily.     benzonatate 100 MG capsule  Commonly known as:  TESSALON  Take 1 capsule (100 mg total) by mouth 3 (three) times daily as needed for cough.     dexlansoprazole 60 MG capsule  Commonly known as:  DEXILANT  Take 1 capsule (60 mg total) by mouth daily.     fish oil-omega-3 fatty acids 1000 MG capsule  Take 1 g by mouth daily.     folic acid 800 MCG tablet  Commonly known as:  FOLVITE  Take 0.5 tablets (400 mcg total) by mouth daily.     ibuprofen 800 MG tablet  Commonly known as:  ADVIL,MOTRIN  Take 800 mg by mouth every 8 (eight) hours as needed for pain.     levothyroxine 50 MCG tablet  Commonly known as:  SYNTHROID, LEVOTHROID  Take 50 mcg by mouth daily before breakfast.     predniSONE 10 MG tablet  Commonly known as:  DELTASONE  Take 1 tablet (10 mg total) by mouth daily.     promethazine 25 MG tablet  Commonly known as:  PHENERGAN  Take 1 tablet (25 mg total) by mouth every 6 (six) hours as needed for nausea or  vomiting.     traMADol 50 MG tablet  Commonly known as:  ULTRAM  Take 1 tablet (50 mg total) by mouth every 6 (six) hours as needed for moderate pain.     VITAMIN C PO  Take 500 mg elemental calcium/kg/hr by mouth daily.          Consults:     SIGNIFICANT DIAGNOSTIC STUDIES:  Dg Chest 2 View  10/24/2015  CLINICAL DATA:  Chest pain EXAM: CHEST  2 VIEW COMPARISON:  05/05/2014 FINDINGS: Cardiac shadow is at the upper limits of normal in size but stable. The lungs are well aerated bilaterally. No focal infiltrate or sizable effusion is seen. No acute bony abnormality is noted. IMPRESSION: No acute abnormality noted. Electronically Signed   By: Alcide Clever M.D.   On: 10/24/2015 03:57      No results found for this or any previous visit (from the past 240 hour(s)).  BRIEF ADMITTING H & P: [name] is a 55 y.o. female with a past medical history significant for sickle cell disease, RA, and hypothyroidism who presents with chest pain.  The patient was in her usual state of health until one week ago when she developed her symptoms, cough, and emesis. She was seen by a doctor who prescribed supportive cares, and  2 days later she started to develop sharp sternal and rib pain, moderate to severe in intensity, and radiating around the ribs, worse with inspiration. She hasn't had pain like this during pain crisis in the past, but never this severe. She decided taking ibuprofen which didn't help, and then took oxycodone which also didn't help. She denies fever, purulent sputum, dyspnea. She has noticed increased fatigue with exertion this week.  In the ED, she was afebrile, hemodynamically stable, and saturating well on ambient air. CXR negative. K 3.4, Cr 0.8, TBili 4.0, WBC 16.5K, Hgb 5.6 (her baseline appears to be in the 6-8 range). Troponin negative. Hydromorphone was administered and TRH were asked to evaluate for admission for sickle cell pain crisis.   Hospital Course:  Present on  Admission:  . Sickle cell crisis (HCC):Pt's pain was managed with Dilaudid via PCA, Toradol and IVF. As the pain improve she was transitioned to oral analgesics. At the time of admission her pain was 10/10 and at the time of discharge her pain had decreased to 4/10. Patient reports that her baseline pain is approximately 7/10. Marland Kitchen Anemia of Chronic Disease: Review of patient's record shows that she has a baseline hemoglobin of approximately 6.5 g/dL. During this hospitalization her hemoglobin decreased to a nadir of 5.5 g/dL. She was transfused one unit of RBC's and at the time of discharge her hemoglobin was 6.5 g/dL. A review of this patient's record shows that proximately one year ago she had a hemoglobin S 94% and hemoglobin F of 1.5%. This patient will benefit from hydroxyurea use in the management of her sickle cell disease. . Acute Bronchitis: Patient presented with signs and symptoms of acute bronchitis. She was treated with albuterol on as-needed basis and azithromycin. She is to continue azithromycin 250 mg for 2 additional doses in the next 2 days. She is also treated with a slump [place] for coughing. . Nausea and Vomiting: Initially on admission the patient had nausea and vomited. Appears that she was a bit clinically dehydrated. When she had hydration she was able to tolerate her diet her diet was advanced to regular diet. At the time of discharge the patient is tolerating diet without any difficulty. . Proteiunria: The patient is noted to have proteinuria of 30 mg/dL. This could be seen as an early sign of nephropathy associated with sickle cell disease. 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 She should have a 24-hour urine done as an outpatient to further quantify proteinuria in the context of urine output. She would benefit from an ACE inhibitor to be initiated by her primary care physician for renal protection. . Hypothyroidism:  The patient is currently on Synthroid. Her TSH was very very mildly elevated. She's continued on her usual dose of Synthroid and should follow-up with her primary care physician in the outpatient setting to further evaluate her thyroid function.  . Rheumatoid arthritis (HCC): The patient had been on Remicade up until one year ago when unfortunately she was unable to afford the medication. She did not go back to the rheumatologist and currently is without therapy for her rheumatoid arthritis. I will defer to her primary care physician to follow up on this. . Hepatitis C colon. The patient is known to have hepatitis C she per her own report initiated treatment with [facility] but was unable to continue the treatment secondary to lack of transportation to  the physician's office. . Prolonged QT interval: On  arrival to the hospital the patient is noted to have a prolonged QT interval of 477 ms. With hydration and correction of electrolyte imbalance her repeat EKG showed a QT interval within normal limits while being treated with Zithromax. She is being discharged home on Zithromax with a normal QT interval.    Disposition and Follow-up:  Patient is discharged home in good condition. She's been advised to follow-up with her primary care physician Dr. Barnabas Harries in approximately 3 days.     Discharge Instructions    Activity as tolerated - No restrictions    Complete by:  As directed      Diet general    Complete by:  As directed            DISCHARGE EXAM:  General: Alert, awake, oriented x3, in no apparent distress.  Vital signs: BP 101/62, HR 63, T 97.8 F (36.6 C), temperature source Oral, RR 11, height 5\' 11"  (1.803 m), weight 144 lb 3.2 oz (65.409 kg), SpO2 99 % on RA. HEENT: [place]/AT PEERL, EOMI, anicteric Neck: Trachea midline, no masses, no thyromegal,y no JVD, no carotid bruit OROPHARYNX: Moist, No exudate/ erythema/lesions.  Heart: Regular rate and rhythm, without murmurs, rubs,  gallops or S3. PMI non-displaced. Exam reveals no decreased pulses. Pulmonary/Chest: Normal effort. Breath sounds normal. No. Apnea. Clear to auscultation,no stridor,  no wheezing and no rhonchi noted. No respiratory distress and no tenderness noted. Abdomen: Soft, nontender, nondistended, normal bowel sounds, no masses no hepatosplenomegaly noted. No fluid wave and no ascites. There is no guarding or rebound. Neuro: Alert and oriented to person, place and time. Normal motor skills, Displays no atrophy or tremors and exhibits normal muscle tone.  No focal neurological deficits noted cranial nerves II through XII grossly intact. No sensory deficit noted.  Strength at baseline in bilateral upper and lower extremities. Gait normal. Musculoskeletal: No warm swelling or erythema around joints, no spinal tenderness noted. Psychiatric: Patient alert and oriented x3, good insight and cognition, good recent to remote recall. Mood, memory, affect and judgement normal Lymph node survey: No cervical axillary or inguinal lymphadenopathy noted. Skin: Skin is warm and dry. No bruising, no ecchymosis and no rash noted. Pt is not diaphoretic. No erythema. No pallor      Recent Labs  10/24/15 0342 10/24/15 0936 10/25/15 0454  NA 137  --  140  K 3.4*  --  3.8  CL 104  --  105  CO2 23  --  27  GLUCOSE 149*  --  95  BUN 5*  --  7  CREATININE 0.85  --  0.83  CALCIUM 9.3  --  9.0  MG  --  2.2  --     Recent Labs  10/24/15 0342 10/25/15 0454  AST 46* 36  ALT 24 17  ALKPHOS 82 91  BILITOT 4.0* 2.9*  PROT 7.4 7.1  ALBUMIN 3.3* 3.2*   No results for input(s): LIPASE, AMYLASE in the last 72 hours.  Recent Labs  10/24/15 0342 10/25/15 0454  WBC 16.5* 11.7*  NEUTROABS 11.2* 7.6  HGB 5.6* 6.5*  HCT 16.7* 19.2*  MCV 70.5* 75.9*  PLT 197 183     Total time spent including face to face and decision making was greater than 30 minutes  Signed: Joss Mcdill A. 10/26/2015, 10:05 AM

## 2017-02-04 ENCOUNTER — Emergency Department (HOSPITAL_COMMUNITY)
Admission: EM | Admit: 2017-02-04 | Discharge: 2017-02-04 | Discharge status: Against Medical Advice | Payer: Medicare Other | Attending: Emergency Medicine | Admitting: Emergency Medicine

## 2017-02-04 ENCOUNTER — Encounter (HOSPITAL_COMMUNITY): Payer: Self-pay | Admitting: Emergency Medicine

## 2017-02-04 DIAGNOSIS — Z5321 Procedure and treatment not carried out due to patient leaving prior to being seen by health care provider: Secondary | ICD-10-CM | POA: Diagnosis not present

## 2017-02-04 DIAGNOSIS — Y929 Unspecified place or not applicable: Secondary | ICD-10-CM | POA: Insufficient documentation

## 2017-02-04 DIAGNOSIS — Y9389 Activity, other specified: Secondary | ICD-10-CM | POA: Insufficient documentation

## 2017-02-04 DIAGNOSIS — S99911A Unspecified injury of right ankle, initial encounter: Secondary | ICD-10-CM | POA: Insufficient documentation

## 2017-02-04 DIAGNOSIS — Y999 Unspecified external cause status: Secondary | ICD-10-CM | POA: Insufficient documentation

## 2017-02-04 DIAGNOSIS — W19XXXA Unspecified fall, initial encounter: Secondary | ICD-10-CM | POA: Insufficient documentation

## 2017-02-04 LAB — CBC WITH DIFFERENTIAL/PLATELET
Band Neutrophils: 0 %
Basophils Absolute: 0.1 10*3/uL (ref 0.0–0.1)
Basophils Relative: 1 %
Blasts: 0 %
Eosinophils Absolute: 0.3 10*3/uL (ref 0.0–0.7)
Eosinophils Relative: 4 %
HCT: 15 % — ABNORMAL LOW (ref 36.0–46.0)
Hemoglobin: 5.1 g/dL — CL (ref 12.0–15.0)
Lymphocytes Relative: 31 %
Lymphs Abs: 2.4 10*3/uL (ref 0.7–4.0)
MCH: 26.7 pg (ref 26.0–34.0)
MCHC: 34 g/dL (ref 30.0–36.0)
MCV: 78.5 fL (ref 78.0–100.0)
Metamyelocytes Relative: 0 %
Monocytes Absolute: 0.7 10*3/uL (ref 0.1–1.0)
Monocytes Relative: 9 %
Myelocytes: 0 %
Neutro Abs: 4.2 10*3/uL (ref 1.7–7.7)
Neutrophils Relative %: 55 %
Other: 0 %
Platelets: 158 10*3/uL (ref 150–400)
Promyelocytes Absolute: 0 %
RBC: 1.91 MIL/uL — ABNORMAL LOW (ref 3.87–5.11)
RDW: 26.9 % — ABNORMAL HIGH (ref 11.5–15.5)
WBC: 7.7 10*3/uL (ref 4.0–10.5)
nRBC: 0 /100 WBC

## 2017-02-04 LAB — COMPREHENSIVE METABOLIC PANEL
ALT: 24 U/L (ref 14–54)
AST: 85 U/L — ABNORMAL HIGH (ref 15–41)
Albumin: 4 g/dL (ref 3.5–5.0)
Alkaline Phosphatase: 52 U/L (ref 38–126)
Anion gap: 7 (ref 5–15)
BUN: <5 mg/dL — ABNORMAL LOW (ref 6–20)
CO2: 22 mmol/L (ref 22–32)
Calcium: 9.2 mg/dL (ref 8.9–10.3)
Chloride: 106 mmol/L (ref 101–111)
Creatinine, Ser: 0.84 mg/dL (ref 0.44–1.00)
GFR calc Af Amer: >60 mL/min (ref >60)
GFR calc non Af Amer: >60 mL/min (ref >60)
Glucose, Bld: 156 mg/dL — ABNORMAL HIGH (ref 65–99)
Potassium: 3.2 mmol/L — ABNORMAL LOW (ref 3.5–5.1)
Sodium: 135 mmol/L (ref 135–145)
Total Bilirubin: 3.1 mg/dL — ABNORMAL HIGH (ref 0.3–1.2)
Total Protein: 8 g/dL (ref 6.5–8.1)

## 2017-02-04 LAB — URINALYSIS, ROUTINE W REFLEX MICROSCOPIC
Bilirubin Urine: NEGATIVE
Glucose, UA: NEGATIVE mg/dL
Ketones, ur: NEGATIVE mg/dL
Nitrite: NEGATIVE
Protein, ur: NEGATIVE mg/dL
Specific Gravity, Urine: 1.005 (ref 1.005–1.030)
pH: 6 (ref 5.0–8.0)

## 2017-02-04 LAB — I-STAT CG4 LACTIC ACID, ED: Lactic Acid, Venous: 2.96 mmol/L (ref 0.5–1.9)

## 2017-02-04 NOTE — ED Triage Notes (Signed)
Pt has hx of leg ulcer on lower right ankle-- hx of skin graft in same place in 1990's -- draining greenish yellow pus x 1 week.

## 2017-02-04 NOTE — ED Notes (Signed)
Pt called twice to have vitals reassessed. No answer. Will call one more time.

## 2017-02-04 NOTE — ED Notes (Signed)
No answer for vital check. 

## 2017-02-05 ENCOUNTER — Observation Stay (HOSPITAL_COMMUNITY)
Admission: EM | Admit: 2017-02-05 | Discharge: 2017-02-06 | Disposition: A | Discharge status: Home / Self Care | Payer: Medicare Other | Attending: Internal Medicine | Admitting: Internal Medicine

## 2017-02-05 ENCOUNTER — Encounter (HOSPITAL_COMMUNITY): Payer: Self-pay | Admitting: Family Medicine

## 2017-02-05 ENCOUNTER — Emergency Department (HOSPITAL_COMMUNITY): Payer: Medicare Other

## 2017-02-05 DIAGNOSIS — B1920 Unspecified viral hepatitis C without hepatic coma: Secondary | ICD-10-CM | POA: Insufficient documentation

## 2017-02-05 DIAGNOSIS — S81801A Unspecified open wound, right lower leg, initial encounter: Secondary | ICD-10-CM | POA: Diagnosis present

## 2017-02-05 DIAGNOSIS — M25571 Pain in right ankle and joints of right foot: Secondary | ICD-10-CM | POA: Diagnosis present

## 2017-02-05 DIAGNOSIS — R0902 Hypoxemia: Secondary | ICD-10-CM

## 2017-02-05 DIAGNOSIS — D649 Anemia, unspecified: Principal | ICD-10-CM | POA: Diagnosis present

## 2017-02-05 DIAGNOSIS — L97329 Non-pressure chronic ulcer of left ankle with unspecified severity: Secondary | ICD-10-CM | POA: Diagnosis not present

## 2017-02-05 DIAGNOSIS — E039 Hypothyroidism, unspecified: Secondary | ICD-10-CM | POA: Diagnosis present

## 2017-02-05 DIAGNOSIS — D638 Anemia in other chronic diseases classified elsewhere: Secondary | ICD-10-CM | POA: Diagnosis present

## 2017-02-05 DIAGNOSIS — Z79899 Other long term (current) drug therapy: Secondary | ICD-10-CM | POA: Diagnosis not present

## 2017-02-05 DIAGNOSIS — E079 Disorder of thyroid, unspecified: Secondary | ICD-10-CM | POA: Diagnosis present

## 2017-02-05 LAB — BASIC METABOLIC PANEL
Anion gap: 10 (ref 5–15)
BUN: 7 mg/dL (ref 6–20)
CO2: 22 mmol/L (ref 22–32)
Calcium: 9.4 mg/dL (ref 8.9–10.3)
Chloride: 106 mmol/L (ref 101–111)
Creatinine, Ser: 0.77 mg/dL (ref 0.44–1.00)
GFR calc Af Amer: >60 mL/min (ref >60)
GFR calc non Af Amer: >60 mL/min (ref >60)
Glucose, Bld: 89 mg/dL (ref 65–99)
Potassium: 3.3 mmol/L — ABNORMAL LOW (ref 3.5–5.1)
Sodium: 138 mmol/L (ref 135–145)

## 2017-02-05 MED ORDER — OXYCODONE-ACETAMINOPHEN 5-325 MG PO TABS
1.0000 | ORAL_TABLET | Freq: Once | ORAL | Status: AC
  Administered 2017-02-05: 1 via ORAL
  Filled 2017-02-05: qty 1

## 2017-02-05 NOTE — ED Triage Notes (Signed)
Patient reports she is experiencing right ankle pain with an open wound visible. The wound has a green/yellow discharge. Patient has placed a gauze over the wound with paper tape to secure it. Denies any fever and any sickle cell pain. Patient reports she has been taking GABAPENTIN and TYLENOL for the pain.

## 2017-02-05 NOTE — ED Notes (Signed)
Patient transported to X-ray 

## 2017-02-06 ENCOUNTER — Encounter (HOSPITAL_COMMUNITY): Payer: Self-pay | Admitting: Internal Medicine

## 2017-02-06 ENCOUNTER — Observation Stay (HOSPITAL_COMMUNITY): Payer: Medicare Other

## 2017-02-06 DIAGNOSIS — E039 Hypothyroidism, unspecified: Secondary | ICD-10-CM | POA: Diagnosis not present

## 2017-02-06 DIAGNOSIS — D649 Anemia, unspecified: Secondary | ICD-10-CM | POA: Diagnosis not present

## 2017-02-06 DIAGNOSIS — S81801A Unspecified open wound, right lower leg, initial encounter: Secondary | ICD-10-CM | POA: Diagnosis not present

## 2017-02-06 DIAGNOSIS — D638 Anemia in other chronic diseases classified elsewhere: Secondary | ICD-10-CM | POA: Diagnosis not present

## 2017-02-06 DIAGNOSIS — D571 Sickle-cell disease without crisis: Secondary | ICD-10-CM | POA: Diagnosis not present

## 2017-02-06 DIAGNOSIS — R0902 Hypoxemia: Secondary | ICD-10-CM | POA: Diagnosis not present

## 2017-02-06 DIAGNOSIS — R269 Unspecified abnormalities of gait and mobility: Secondary | ICD-10-CM | POA: Diagnosis not present

## 2017-02-06 LAB — CBC WITH DIFFERENTIAL/PLATELET
Basophils Absolute: 0.1 K/uL (ref 0.0–0.1)
Basophils Relative: 1 %
Eosinophils Absolute: 0.5 K/uL (ref 0.0–0.7)
Eosinophils Relative: 6 %
HCT: 13.1 % — ABNORMAL LOW (ref 36.0–46.0)
Hemoglobin: 4.8 g/dL — CL (ref 12.0–15.0)
Lymphocytes Relative: 44 %
Lymphs Abs: 3.4 K/uL (ref 0.7–4.0)
MCH: 27.7 pg (ref 26.0–34.0)
MCHC: 36.6 g/dL — ABNORMAL HIGH (ref 30.0–36.0)
MCV: 75.7 fL — ABNORMAL LOW (ref 78.0–100.0)
Monocytes Absolute: 0.9 K/uL (ref 0.1–1.0)
Monocytes Relative: 12 %
Neutro Abs: 2.9 K/uL (ref 1.7–7.7)
Neutrophils Relative %: 37 %
Platelets: 154 K/uL (ref 150–400)
RBC: 1.73 MIL/uL — ABNORMAL LOW (ref 3.87–5.11)
RDW: 27.1 % — ABNORMAL HIGH (ref 11.5–15.5)
WBC: 7.8 K/uL (ref 4.0–10.5)
nRBC: 12 /100{WBCs} — ABNORMAL HIGH

## 2017-02-06 LAB — PROTIME-INR
INR: 1.23
Prothrombin Time: 15.5 s — ABNORMAL HIGH (ref 11.4–15.2)

## 2017-02-06 LAB — BASIC METABOLIC PANEL
Anion gap: 8 (ref 5–15)
BUN: 6 mg/dL (ref 6–20)
CO2: 26 mmol/L (ref 22–32)
Calcium: 9.2 mg/dL (ref 8.9–10.3)
Chloride: 105 mmol/L (ref 101–111)
Creatinine, Ser: 0.74 mg/dL (ref 0.44–1.00)
GFR calc Af Amer: >60 mL/min (ref >60)
GFR calc non Af Amer: >60 mL/min (ref >60)
Glucose, Bld: 93 mg/dL (ref 65–99)
Potassium: 3.4 mmol/L — ABNORMAL LOW (ref 3.5–5.1)
Sodium: 139 mmol/L (ref 135–145)

## 2017-02-06 LAB — CBC
HCT: 19.3 % — ABNORMAL LOW (ref 36.0–46.0)
Hemoglobin: 6.9 g/dL — CL (ref 12.0–15.0)
MCH: 27.4 pg (ref 26.0–34.0)
MCHC: 35.8 g/dL (ref 30.0–36.0)
MCV: 76.6 fL — ABNORMAL LOW (ref 78.0–100.0)
Platelets: 178 10*3/uL (ref 150–400)
RBC: 2.52 MIL/uL — ABNORMAL LOW (ref 3.87–5.11)
RDW: 22.2 % — ABNORMAL HIGH (ref 11.5–15.5)
WBC: 6.3 10*3/uL (ref 4.0–10.5)

## 2017-02-06 LAB — RETICULOCYTES
RBC.: 1.75 MIL/uL — ABNORMAL LOW (ref 3.87–5.11)
Retic Count, Absolute: 315 10*3/uL — ABNORMAL HIGH (ref 19.0–186.0)
Retic Ct Pct: 18 % — ABNORMAL HIGH (ref 0.4–3.1)

## 2017-02-06 LAB — PREPARE RBC (CROSSMATCH)

## 2017-02-06 LAB — LACTATE DEHYDROGENASE: LDH: 602 U/L — ABNORMAL HIGH (ref 98–192)

## 2017-02-06 LAB — APTT: aPTT: 32 s (ref 24–36)

## 2017-02-06 MED ORDER — ACETAMINOPHEN 650 MG RE SUPP: 650.0000 mg | Freq: Four times a day (QID) | RECTAL | Status: DC | PRN

## 2017-02-06 MED ORDER — OXYCODONE HCL 5 MG PO TABS
5.0000 mg | ORAL_TABLET | ORAL | Status: DC | PRN
  Administered 2017-02-06 (×2): 5 mg via ORAL
  Filled 2017-02-06 (×2): qty 1

## 2017-02-06 MED ORDER — ACETAMINOPHEN 325 MG PO TABS: 650.0000 mg | ORAL_TABLET | Freq: Four times a day (QID) | ORAL | Status: DC | PRN

## 2017-02-06 MED ORDER — INTRASITE GEL APPLIPAK EX GEL: CUTANEOUS | 0 refills | Status: DC

## 2017-02-06 MED ORDER — LEVOTHYROXINE SODIUM 50 MCG PO TABS
175.0000 ug | ORAL_TABLET | Freq: Every day | ORAL | Status: DC
  Administered 2017-02-06: 175 ug via ORAL
  Filled 2017-02-06: qty 1

## 2017-02-06 MED ORDER — ENOXAPARIN SODIUM 40 MG/0.4ML ~~LOC~~ SOLN
40.0000 mg | SUBCUTANEOUS | Status: DC
  Administered 2017-02-06: 40 mg via SUBCUTANEOUS
  Filled 2017-02-06: qty 0.4

## 2017-02-06 MED ORDER — ONDANSETRON HCL 4 MG PO TABS
4.0000 mg | ORAL_TABLET | Freq: Four times a day (QID) | ORAL | Status: DC | PRN
  Filled 2017-02-06: qty 1

## 2017-02-06 MED ORDER — ONDANSETRON HCL 4 MG/2ML IJ SOLN
4.0000 mg | Freq: Four times a day (QID) | INTRAMUSCULAR | Status: DC | PRN
  Administered 2017-02-06 (×2): 4 mg via INTRAVENOUS
  Filled 2017-02-06: qty 2

## 2017-02-06 MED ORDER — GABAPENTIN 100 MG PO CAPS: 100.0000 mg | ORAL_CAPSULE | Freq: Every day | ORAL | Status: DC

## 2017-02-06 MED ORDER — FOLIC ACID 1 MG PO TABS
1000.0000 ug | ORAL_TABLET | Freq: Every day | ORAL | Status: DC
  Administered 2017-02-06: 1 mg via ORAL
  Filled 2017-02-06: qty 1

## 2017-02-06 NOTE — Consult Note (Addendum)
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   Wound bed: covered with dry serum Drainage (amount, consistency, odor) none at this time Periwound:dark pigmentation, has white scar tissue proximal to wound Dressing procedure/placement/frequency: I have provided nurses with orders for cleansing with NS, pat dry, apply hydrogel (intrasite gel) change daily. I performed today. Pt to go home today, I instructed in care at home. I will not follow.  Please re-consult if needed.   Barnett Hatter, RN-C, WTA-C Wound Treatment Associate

## 2017-02-06 NOTE — H&P (Signed)
History and Physical    [name] OLM:[id number] DOB: 1960/10/19 DOA: 02/05/2017  PCP: Cheron Schaumann., MD  Patient coming from: Home.  Chief Complaint: Right leg pain.  HPI: [name] is a 56 y.o. female with history of sickle cell anemia, rheumatoid arthritis, hypothyroidism presents to the ER with complaints of pain in the right leg. Patient had sustained an injury a few months ago and has been having chronic skin changes over the distal aspect of the right leg. Patient has healed ulcers that which patient states has opened up a few days ago and has been hurting her. No active discharge noticed. Denies any fever or chills. Patient also said she has been having some weakness.   ED Course: In the ER patient was found to be hypoxic and hemoglobin is around 4.8. Patient's baseline hemoglobin is usually around 6.5. Wound on the right leg does not look infected. X-rays are unremarkable of the right leg. Patient is admitted for symptomatic anemia.  Review of Systems: As per HPI, rest all negative.   Past Medical History:  Diagnosis Date  . Aneurysm (HCC)   . Anxiety   . Arthritis   . Colon ulcer   . Hypokalemia   . Hypothyroidism   . Pneumonia   . Sickle cell anemia (HCC)   . Ulcers of both lower extremities [facility])     Past Surgical History:  Procedure Laterality Date  . ANKLE SURGERY  1989 and 1990  . CEREBRAL ANEURYSM REPAIR    . COLONOSCOPY  05/12/2012   Procedure: COLONOSCOPY;  Surgeon: Hart Carwin, MD;  Location: [facility];  Service: Endoscopy;  Laterality: N/A;  . ESOPHAGOGASTRODUODENOSCOPY  05/12/2012   Procedure: ESOPHAGOGASTRODUODENOSCOPY (EGD);  Surgeon: Hart Carwin, MD;  Location: [facility];  Service: Endoscopy;  Laterality: N/A;  . GIVENS CAPSULE STUDY  05/13/2012   Procedure: GIVENS CAPSULE STUDY;  Surgeon: Hart Carwin, MD;  Location: [facility];  Service: Endoscopy;  Laterality: N/A;     reports that she has never smoked. She has never used  smokeless tobacco. She reports that she does not drink alcohol or use drugs.  Allergies  Allergen Reactions  . Ampicillin     Per patient, she tolerates cephalosporins without problems  . Demerol [Meperidine] Other (See Comments)    unknow    Family History  Problem Relation Age of Onset  . Cancer Mother 50       Esophageal  . Sickle cell trait Mother   . Sickle cell trait Father   . HIV/AIDS Brother     Prior to Admission medications   Medication Sig Start Date End Date Taking? Authorizing Provider  acetaminophen (TYLENOL) 500 MG tablet Take 1,000 mg by mouth every 6 (six) hours as needed for mild pain.   Yes [provider]  Ascorbic Acid (VITAMIN C PO) Take 500 mg elemental calcium/kg/hr by mouth daily.   Yes [provider]  Calcium Carb-Cholecalciferol 600-800 MG-UNIT CHEW Chew 1 tablet by mouth daily.   Yes [provider]  folic acid (FOLVITE) 800 MCG tablet Take 0.5 tablets (400 mcg total) by mouth daily. Patient taking differently: Take 800 mcg by mouth daily.  11/30/11  Yes Keitha Butte, NP  gabapentin (NEURONTIN) 100 MG capsule Take 100-300 mg by mouth at bedtime. 03/08/15  Yes [provider]  levothyroxine (SYNTHROID, LEVOTHROID) 175 MCG tablet Take 1 tablet by mouth daily. 01/29/17  Yes [provider]  levothyroxine (SYNTHROID, LEVOTHROID) 50 MCG tablet Take  50 mcg by mouth daily before breakfast.   Yes [provider]  benzonatate (TESSALON) 100 MG capsule Take 1 capsule (100 mg total) by mouth 3 (three) times daily as needed for cough. Patient not taking: Reported on 02/05/2017 10/26/15   Altha Harm, MD  dexlansoprazole (DEXILANT) 60 MG capsule Take 1 capsule (60 mg total) by mouth daily. Patient not taking: Reported on 02/05/2017 05/16/12   Gwenyth Bender, MD  ibuprofen (ADVIL,MOTRIN) 800 MG tablet Take 800 mg by mouth every 8 (eight) hours as needed for pain.    [provider]  predniSONE  (DELTASONE) 10 MG tablet Take 1 tablet (10 mg total) by mouth daily. Patient not taking: Reported on 02/04/2017 12/16/12   Altha Harm, MD  promethazine (PHENERGAN) 25 MG tablet Take 1 tablet (25 mg total) by mouth every 6 (six) hours as needed for nausea or vomiting. Patient not taking: Reported on 02/05/2017 05/05/14   Renne Crigler, PA-C  traMADol (ULTRAM) 50 MG tablet Take 1 tablet (50 mg total) by mouth every 6 (six) hours as needed for moderate pain. Patient not taking: Reported on 02/05/2017 10/26/15   Altha Harm, MD    Physical Exam: Vitals:   02/05/17 2303 02/06/17 0105 02/06/17 0149 02/06/17 0215  BP: 119/74 123/76 131/75 121/65  Pulse: (!) 57 90 (!) 56 (!) 53  Resp: 16 14 17 15   Temp: 98.3 F (36.8 C)  98.1 F (36.7 C) 97.6 F (36.4 C)  TempSrc: Oral  Oral Oral  SpO2: 91% 100% 100% 100%  Weight:   66.8 kg (147 lb 4.3 oz)   Height:   5\' 11"  (1.803 m)       Constitutional: Moderately built and nourished. Vitals:   02/05/17 2303 02/06/17 0105 02/06/17 0149 02/06/17 0215  BP: 119/74 123/76 131/75 121/65  Pulse: (!) 57 90 (!) 56 (!) 53  Resp: 16 14 17 15   Temp: 98.3 F (36.8 C)  98.1 F (36.7 C) 97.6 F (36.4 C)  TempSrc: Oral  Oral Oral  SpO2: 91% 100% 100% 100%  Weight:   66.8 kg (147 lb 4.3 oz)   Height:   5\' 11"  (1.803 m)    Eyes: Anicteric mild pallor. ENMT: No discharge from the ears eyes nose and mouth. Neck: No mass felt. No neck rigidity. Respiratory: No rhonchi or crepitations. Cardiovascular: S1-S2 heard no murmurs appreciated. Abdomen: Soft nontender bowel sounds present. Musculoskeletal: No edema. No effusion. Skin: Chronic skin changes in the right lower extremity. Neurologic: Alert awake oriented to time place and person. Moves all extremity. Psychiatric: Appears normal. Normal affect.   Labs on Admission: I have personally reviewed following labs and imaging studies  CBC:  Recent Labs Lab 02/04/17 1604 02/05/17 2333  WBC  7.7 7.8  NEUTROABS 4.2 2.9  HGB 5.1* 4.8*  HCT 15.0* 13.1*  MCV 78.5 75.7*  PLT 158 154   Basic Metabolic Panel:  Recent Labs Lab 02/04/17 1604 02/05/17 2333  NA 135 138  K 3.2* 3.3*  CL 106 106  CO2 22 22  GLUCOSE 156* 89  BUN <5* 7  CREATININE 0.84 0.77  CALCIUM 9.2 9.4   GFR: Estimated Creatinine Clearance: 82.8 mL/min (by C-G formula based on SCr of 0.77 mg/dL). Liver Function Tests:  Recent Labs Lab 02/04/17 1604  AST 85*  ALT 24  ALKPHOS 52  BILITOT 3.1*  PROT 8.0  ALBUMIN 4.0   No results for input(s): LIPASE, AMYLASE in the last 168 hours. No results for  input(s): AMMONIA in the last 168 hours. Coagulation Profile:  Recent Labs Lab 02/06/17 0019  INR 1.23   Cardiac Enzymes: No results for input(s): CKTOTAL, CKMB, CKMBINDEX, TROPONINI in the last 168 hours. BNP (last 3 results) No results for input(s): PROBNP in the last 8760 hours. HbA1C: No results for input(s): HGBA1C in the last 72 hours. CBG: No results for input(s): GLUCAP in the last 168 hours. Lipid Profile: No results for input(s): CHOL, HDL, LDLCALC, TRIG, CHOLHDL, LDLDIRECT in the last 72 hours. Thyroid Function Tests: No results for input(s): TSH, T4TOTAL, FREET4, T3FREE, THYROIDAB in the last 72 hours. Anemia Panel: No results for input(s): VITAMINB12, FOLATE, FERRITIN, TIBC, IRON, RETICCTPCT in the last 72 hours. Urine analysis:    Component Value Date/Time   COLORURINE YELLOW 02/04/2017 2010   APPEARANCEUR CLEAR 02/04/2017 2010   LABSPEC 1.005 02/04/2017 2010   PHURINE 6.0 02/04/2017 2010   GLUCOSEU NEGATIVE 02/04/2017 2010   HGBUR SMALL (A) 02/04/2017 2010   BILIRUBINUR NEGATIVE 02/04/2017 2010   KETONESUR NEGATIVE 02/04/2017 2010   PROTEINUR NEGATIVE 02/04/2017 2010   UROBILINOGEN 1 05/11/2014 1233   NITRITE NEGATIVE 02/04/2017 2010   LEUKOCYTESUR SMALL (A) 02/04/2017 2010   Sepsis Labs: @LABRCNTIP (procalcitonin:4,lacticidven:4) )No results found for this or any  previous visit (from the past 240 hour(s)).   Radiological Exams on Admission: Dg Tibia/fibula Right  Result Date: 02/06/2017 CLINICAL DATA:  Initial evaluation for ulcer at medial aspect of right ankle. Increase pain. EXAM: RIGHT TIBIA AND FIBULA - 2 VIEW COMPARISON:  None. FINDINGS: No acute fracture dislocation. Diffuse osteopenia. No appreciable soft tissue injury. No soft tissue emphysema. No radiopaque foreign body. IMPRESSION: 1. No acute osseous abnormality about the right tibia/fibula. No evidence for acute osteomyelitis. 2. Osteopenia. Electronically Signed   By: Rise Mu M.D.   On: 02/06/2017 00:30     Assessment/Plan Principal Problem:   Symptomatic anemia Active Problems:   Sickle cell anemia (HCC)   Hypothyroidism   Anemia   Leg wound, right    1. Symptomatic anemia - 2 units of PRBCs has been ordered. Follow CBC. Check LDH for any hemolysis. 2. Right leg pain with chronic wound - does not appear infected. X-rays are unremarkable. Wound team consulted. 3. Sickle cell anemia - will check LDH. Does not appear to be in crisis. 4. Hypothyroidism on Synthroid. 5. History of rheumatoid arthritis - has been off medications for last 1 year.  Chest x-ray pending. I have reviewed patient's old labs and charts.   DVT prophylaxis: Lovenox. Code Status: Full code.  Family Communication: Patient's family at the bedside.  Disposition Plan: Home.  Consults called: Wound.  Admission status: Observation.    Eduard Clos MD [facility] Pager [phone].  If 7PM-7AM, please contact night-coverage [email or web address] Password [email or web address]  02/06/2017, 3:22 AM

## 2017-02-06 NOTE — Discharge Summary (Signed)
[name] MRN: [id number] DOB/AGE: 04/09/1961 56 y.o.  Admit date: 02/05/2017 Discharge date: 02/06/2017  Primary Care Physician:  [name]., MD   Discharge Diagnoses:   Patient Active Problem List   Diagnosis Date Noted  . Symptomatic anemia 02/06/2017  . Leg wound, right 02/06/2017  . Sickle cell crisis (HCC) 10/24/2015  . Anemia of chronic disease   . Hepatitis C 05/11/2014  . 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 Thrombophlebitis leg superficial 03/04/2013  . Rheumatoid arthritis (HCC) 12/16/2012  . Hypothyroidism 11/27/2011    DISCHARGE MEDICATION: Allergies as of 02/06/2017      Reactions   Ampicillin    Per patient, she tolerates cephalosporins without problems   Demerol [meperidine] Other (See Comments)   unknow      Medication List    TAKE these medications   acetaminophen 500 MG tablet Commonly known as:  TYLENOL Take 1,000 mg by mouth every 6 (six) hours as needed for mild pain.   benzonatate 100 MG capsule Commonly known as:  TESSALON Take 1 capsule (100 mg total) by mouth 3 (three) times daily as needed for cough.   Calcium Carb-Cholecalciferol 600-800 MG-UNIT Chew Chew 1 tablet by mouth daily.   dexlansoprazole 60 MG capsule Commonly known as:  DEXILANT Take 1 capsule (60 mg total) by mouth daily.   folic acid 800 MCG tablet Commonly known as:  FOLVITE Take 0.5 tablets (400 mcg total) by mouth daily. What changed:  how much to take   gabapentin 100 MG capsule Commonly known as:  NEURONTIN Take 100-300 mg by mouth at bedtime.   ibuprofen 800 MG tablet Commonly known as:  ADVIL,MOTRIN Take 800 mg by mouth every 8 (eight) hours as needed for pain.   INTRASITE GEL APPLIPAK Gel Clean wound with saline and pat dry. Apply gel to wound bed daily.   levothyroxine 175 MCG tablet Commonly known as:  SYNTHROID, LEVOTHROID Take 1 tablet by mouth daily.   levothyroxine 50 MCG tablet Commonly known as:  SYNTHROID,  LEVOTHROID Take 50 mcg by mouth daily before breakfast.   predniSONE 10 MG tablet Commonly known as:  DELTASONE Take 1 tablet (10 mg total) by mouth daily.   promethazine 25 MG tablet Commonly known as:  PHENERGAN Take 1 tablet (25 mg total) by mouth every 6 (six) hours as needed for nausea or vomiting.   traMADol 50 MG tablet Commonly known as:  ULTRAM Take 1 tablet (50 mg total) by mouth every 6 (six) hours as needed for moderate pain.   VITAMIN C PO Take 500 mg elemental calcium/kg/hr by mouth daily.         Consults:    SIGNIFICANT DIAGNOSTIC STUDIES:  Dg Tibia/fibula Right  Result Date: 02/06/2017 CLINICAL DATA:  Initial evaluation for ulcer at medial aspect of right ankle. Increase pain. EXAM: RIGHT TIBIA AND FIBULA - 2 VIEW COMPARISON:  None. FINDINGS: No acute fracture dislocation. Diffuse osteopenia. No appreciable soft tissue injury. No soft tissue emphysema. No radiopaque foreign body. IMPRESSION: 1. No acute osseous abnormality about the right tibia/fibula. No evidence for acute osteomyelitis. 2. Osteopenia. Electronically Signed   By: Rise Mu M.D.   On: 02/06/2017 00:30   Dg Chest Port 1 View  Result Date: 02/06/2017 CLINICAL DATA:  Hypoxia EXAM: PORTABLE CHEST 1 VIEW COMPARISON:  Chest radiograph 10/24/2015 FINDINGS: Unchanged cardiomegaly. Bibasilar atelectasis. No overt pulmonary edema. No pleural effusion or pneumothorax. No focal consolidation. IMPRESSION: Cardiomegaly without overt pulmonary edema. Electronically Signed   By:  Deatra Robinson M.D.   On: 02/06/2017 06:18       No results found for this or any previous visit (from the past 240 hour(s)).  BRIEF ADMITTING H & P: [name] is a 56 y.o. female with history of sickle cell anemia, rheumatoid arthritis, hypothyroidism presents to the ER with complaints of pain in the right leg. Patient had sustained an injury a few months ago and has been having chronic skin changes over the distal  aspect of the right leg. Patient has healed ulcers that which patient states has opened up a few days ago and has been hurting her. No active discharge noticed. Denies any fever or chills. Patient also said she has been having some weakness.   ED Course: In the ER patient was found to be hypoxic and hemoglobin is around 4.8. Patient's baseline hemoglobin is usually around 6.5. Wound on the right leg does not look infected. X-rays are unremarkable of the right leg. Patient is admitted for symptomatic anemia.    Hospital Course:  Present on Admission: . Hypothyroidism . Symptomatic anemia . Leg wound, right . Anemia of chronic disease  1. Symptomatic Anemia: Pt was transfused 1 unit RBC' s and at the time of discharge she was asymptomatic with saturations being 100% on RA at HR at 55 at rest. She was unable to perform an ambulatory evaluation due to gait abnormality associated with pain in RLE. Post-transfusion Hb was 6.9 g/dL.  2. Right Vasculitic Leg Ulcer: Pt has a painful leg ulcer on the medial aspect of the RLE. The wound is dry with whitish slough and intact peri-wound skin. Pt was seen by Wound Ostomy Nurse and recommendations were made for IntraSite Gel Dressing Daily. Pt to follow with PMD for further management of wound. 3. Hb SS without Crisis: Pt has  A chronic anemia associated and she had no associated pain during this admission. 4. Hypothyroidism: Pt was asymptomatic and she was continued on levothyroxine without interruption or change in dose.  5. Gait Abnormality: Pt has abnormality of gait due to pain. She was seen by PT and recommendations were made for walker/crutches. Pt was issued crutches by PT and recommendations were made for out-patient PT 3  Disposition and Follow-up: Pt discharged home in good condition.   DISCHARGE EXAM:  General: Alert, awake, oriented x3, in no apparent distress except with leg pain on standing.Marland Kitchen  HEENT: [place]/AT PEERL, EOMI, anicteric Neck:  Trachea midline, no masses, no thyromegal,y no JVD, no carotid bruit OROPHARYNX: Moist, No exudate/ erythema/lesions.  Heart: Regular rate and rhythm, without murmurs, rubs, gallops or S3. PMI non-displaced. Exam reveals no decreased pulses. Pulmonary/Chest: Normal effort. Breath sounds normal. No. Apnea. Clear to auscultation,no stridor,  no wheezing and no rhonchi noted. No respiratory distress and no tenderness noted. Abdomen: Soft, nontender, nondistended, normal bowel sounds, no masses no hepatosplenomegaly noted. No fluid wave and no ascites. There is no guarding or rebound. Neuro: Alert and oriented to person, place and time. Normal motor skills, Displays no atrophy or tremors and exhibits normal muscle tone.  No focal neurological deficits noted cranial nerves II through XII grossly intact. No sensory deficit noted. Strength at baseline in bilateral upper and lower extremities. Gait antalgic secondary to pain. Musculoskeletal: No warmth swelling or erythema around joints, no spinal tenderness noted. Psychiatric: Patient alert and oriented x3, good insight and cognition, good recent to remote recall. Skin: Skin is warm and dry. Pt has 1 x 2.5 cm wound on the distal  medial aspect of right leg. The wound bed is covered with dry white slough. There is no discharge and the peri-wound skin is normal.   Blood pressure 117/66, pulse 60, temperature 98.1 F (36.7 C), temperature source Oral, resp. rate 17, height 5\' 11"  (1.803 m), weight 66.8 kg (147 lb 4.3 oz), SpO2 100 %.   Recent Labs  02/05/17 2333 02/06/17 1042  NA 138 139  K 3.3* 3.4*  CL 106 105  CO2 22 26  GLUCOSE 89 93  BUN 7 6  CREATININE 0.77 0.74  CALCIUM 9.4 9.2    Recent Labs  02/04/17 1604  AST 85*  ALT 24  ALKPHOS 52  BILITOT 3.1*  PROT 8.0  ALBUMIN 4.0   No results for input(s): LIPASE, AMYLASE in the last 72 hours.  Recent Labs  02/04/17 1604 02/05/17 2333  WBC 7.7 7.8  NEUTROABS 4.2 2.9  HGB 5.1*  4.8*  HCT 15.0* 13.1*  MCV 78.5 75.7*  PLT 158 154     Total time spent including face to face and decision making was greater than 30 minutes  Signed: Camila Norville A. 02/06/2017, 11:31 AM

## 2017-02-06 NOTE — Progress Notes (Signed)
   02/06/17 1543  PT Time Calculation  PT Start Time (ACUTE ONLY) 1452  PT Stop Time (ACUTE ONLY) 1525  PT Time Calculation (min) (ACUTE ONLY) 33 min  PT G-Codes **NOT FOR INPATIENT CLASS**  Functional Assessment Tool Used AM-PAC 6 Clicks Basic Mobility;Clinical judgement  Functional Limitation Mobility: Walking and moving around  Mobility: Walking and Moving Around Current Status (Z3007) CI  Mobility: Walking and Moving Around Goal Status (M2263) CI  Mobility: Walking and Moving Around Discharge Status (F3545) CI  PT General Charges  $$ ACUTE PT VISIT 1 Procedure  PT Evaluation  $PT Eval Low Complexity 1 Procedure  PT Treatments  $Gait Training 8-22 mins   Rebeca Alert, MPT [phone]

## 2017-02-06 NOTE — Evaluation (Signed)
Physical Therapy Evaluation Patient Details Name: [name] MRN: [id number] DOB: 22-Oct-1960 Today's Date: 02/06/2017   History of Present Illness  56 yo female admitted with R LE pain. Hx of leg ulcer R ankle, sickle cell anemia, RA, hypothyroidism.   Clinical Impression  On eval, pt required Min guard-Min assist for mobility. She reported R LE pain as 7/10 at rest and 8/10 with activity. Practiced gait training with RW and crutches.  Instructed pt to use RW for safe ambulation. Practiced stair training with 1 crutch, 1 rail. Pt had some minor difficulty but she was able to demonstrate correct technique. Unsure if she can tolerate ascending a flight of steps to get to apartment. Educated pt on scooting up and down stairs on her buttocks as a back up plan if needed. Recommend HHPT f/u for continued assistive device training (possibly 1-2 visits) if possible. Issued a set of crutches for pt to take home as well but she will need CM to provide a RW for her prior to d/c.     Follow Up Recommendations Home health PT;Supervision for mobility/OOB    Equipment Recommendations  Rolling walker with 5" wheels    Recommendations for Other Services       Precautions / Restrictions Precautions Precautions: Fall Restrictions Weight Bearing Restrictions: No      Mobility  Bed Mobility Overal bed mobility: Modified Independent                Transfers Overall transfer level: Needs assistance   Transfers: Sit to/from Stand Sit to Stand: Supervision         General transfer comment: x 1 with RW. x 1 with crutches. VCs safety, technique, hand placement.   Ambulation/Gait Ambulation/Gait assistance: Min guard Ambulation Distance (Feet): 50 Feet (50'x1 with RW, 30'x 1 with crutches) Assistive device: Rolling walker (2 wheeled);Crutches Gait Pattern/deviations: Step-to pattern;Antalgic     General Gait Details: Pt using toe touch WB gait pattern on R LE due to pain. Sup-Min guard  with RW. Min guard-Min assist with 2 crutches. Pt performed better with RW so instructed her to use RW for safe ambulation.   Stairs Stairs: Yes Stairs assistance: Min assist Stair Management: Step to pattern;Forwards;One rail Left;With crutches Number of Stairs: 2 General stair comments: up and over portable steps. 1 crutch, 1 rail. VCs safety, technique, sequence. Min guard-Min assist. Instructed pt to scoot up and down steps on her buttocks if she doesn't feel she can safely ascend/descend a flight of stairs.   Wheelchair Mobility    Modified Rankin (Stroke Patients Only)       Balance Overall balance assessment: Needs assistance         Standing balance support: Bilateral upper extremity supported Standing balance-Leahy Scale: Poor Standing balance comment: required assistive devices currently                             Pertinent Vitals/Pain Pain Assessment: 0-10 Pain Score: 8  Pain Location: 7/10 at rest; 8/10 R LE with activity Pain Descriptors / Indicators: Sore;Tender Pain Intervention(s): Limited activity within patient's tolerance;Repositioned    Home Living Family/patient expects to be discharged to:: Private residence Living Arrangements: Spouse/significant other;Children Available Help at Discharge: Family Type of Home: Apartment Home Access: Stairs to enter Entrance Stairs-Rails: Left Entrance Stairs-Number of Steps: 1 flight Home Layout: One level Home Equipment: None      Prior Function Level of Independence: Independent  Hand Dominance        Extremity/Trunk Assessment   Upper Extremity Assessment Upper Extremity Assessment: Overall WFL for tasks assessed    Lower Extremity Assessment Lower Extremity Assessment: RLE deficits/detail RLE Deficits / Details: NT-pt able to move limb freely. WBing is limited by pain    Cervical / Trunk Assessment Cervical / Trunk Assessment: Normal  Communication    Communication: No difficulties  Cognition Arousal/Alertness: Awake/alert Behavior During Therapy: WFL for tasks assessed/performed Overall Cognitive Status: Within Functional Limits for tasks assessed                                        General Comments      Exercises     Assessment/Plan    PT Assessment Patient needs continued PT services  PT Problem List Decreased mobility;Pain;Decreased balance;Decreased knowledge of use of DME;Decreased activity tolerance;Decreased skin integrity       PT Treatment Interventions Gait training;Therapeutic activities;Therapeutic exercise;Patient/family education;Balance training;Stair training;Functional mobility training;DME instruction    PT Goals (Current goals can be found in the Care Plan section)  Acute Rehab PT Goals Patient Stated Goal: less pain PT Goal Formulation: With patient Time For Goal Achievement: 02/20/17 Potential to Achieve Goals: Good    Frequency Min 3X/week   Barriers to discharge        Co-evaluation               AM-PAC PT "6 Clicks" Daily Activity  Outcome Measure Difficulty turning over in bed (including adjusting bedclothes, sheets and blankets)?: None   Difficulty sitting down on and standing up from a chair with arms (e.g., wheelchair, bedside commode, etc,.)?: A Little Help needed moving to and from a bed to chair (including a wheelchair)?: A Little Help needed walking in hospital room?: A Little Help needed climbing 3-5 steps with a railing? : A Little 6 Click Score: 16    End of Session Equipment Utilized During Treatment: Gait belt Activity Tolerance: Patient limited by pain Patient left: in bed;with call bell/phone within reach;with family/visitor present   PT Visit Diagnosis: Muscle weakness (generalized) (M62.81);Difficulty in walking, not elsewhere classified (R26.2)    Time: 2683-4196 PT Time Calculation (min) (ACUTE ONLY): 33 min   Charges:   PT  Evaluation $PT Eval Low Complexity: 1 Procedure PT Treatments $Gait Training: 8-22 mins   PT G Codes:          Rebeca Alert, MPT Pager: [phone]

## 2017-02-07 LAB — TYPE AND SCREEN
ABO/RH(D): B POS
Antibody Screen: NEGATIVE
Unit division: 0
Unit division: 0

## 2017-02-07 LAB — BPAM RBC
Blood Product Expiration Date: 201808142359
Blood Product Expiration Date: 201808142359
ISSUE DATE / TIME: 201807180143
ISSUE DATE / TIME: 201807180537
Unit Type and Rh: 9500
Unit Type and Rh: 9500

## 2017-02-07 LAB — HIV ANTIBODY (ROUTINE TESTING W REFLEX): HIV Screen 4th Generation wRfx: NONREACTIVE

## 2017-02-14 ENCOUNTER — Ambulatory Visit (INDEPENDENT_AMBULATORY_CARE_PROVIDER_SITE_OTHER): Payer: Medicare Other | Admitting: Family Medicine

## 2017-02-14 ENCOUNTER — Encounter: Payer: Self-pay | Admitting: Family Medicine

## 2017-02-14 VITALS — BP 132/68 | HR 64 | Temp 98.5°F | Resp 14 | Ht 71.0 in | Wt 148.0 lb

## 2017-02-14 DIAGNOSIS — R269 Unspecified abnormalities of gait and mobility: Secondary | ICD-10-CM | POA: Diagnosis not present

## 2017-02-14 DIAGNOSIS — M0579 Rheumatoid arthritis with rheumatoid factor of multiple sites without organ or systems involvement: Secondary | ICD-10-CM | POA: Diagnosis not present

## 2017-02-14 DIAGNOSIS — E039 Hypothyroidism, unspecified: Secondary | ICD-10-CM | POA: Diagnosis not present

## 2017-02-14 DIAGNOSIS — G894 Chronic pain syndrome: Secondary | ICD-10-CM

## 2017-02-14 DIAGNOSIS — L97912 Non-pressure chronic ulcer of unspecified part of right lower leg with fat layer exposed: Secondary | ICD-10-CM

## 2017-02-14 DIAGNOSIS — D5700 Hb-SS disease with crisis, unspecified: Secondary | ICD-10-CM | POA: Diagnosis not present

## 2017-02-14 DIAGNOSIS — D571 Sickle-cell disease without crisis: Secondary | ICD-10-CM

## 2017-02-14 LAB — CBC WITH DIFFERENTIAL/PLATELET
Basophils Absolute: 89 cells/uL (ref 0–200)
Basophils Relative: 1 %
Eosinophils Absolute: 267 cells/uL (ref 15–500)
Eosinophils Relative: 3 %
HCT: 22.4 % — ABNORMAL LOW (ref 35.0–45.0)
Hemoglobin: 7.4 g/dL — ABNORMAL LOW (ref 11.7–15.5)
Lymphocytes Relative: 33 %
Lymphs Abs: 2937 cells/uL (ref 850–3900)
MCH: 27.2 pg (ref 27.0–33.0)
MCHC: 33 g/dL (ref 32.0–36.0)
MCV: 82.4 fL (ref 80.0–100.0)
MPV: 11.1 fL (ref 7.5–12.5)
Monocytes Absolute: 1157 cells/uL — ABNORMAL HIGH (ref 200–950)
Monocytes Relative: 13 %
Neutro Abs: 4450 cells/uL (ref 1500–7800)
Neutrophils Relative %: 50 %
Platelets: 246 10*3/uL (ref 140–400)
RBC: 2.72 MIL/uL — ABNORMAL LOW (ref 3.80–5.10)
RDW: 21.8 % — ABNORMAL HIGH (ref 11.0–15.0)
WBC: 8.9 10*3/uL (ref 3.8–10.8)

## 2017-02-14 LAB — COMPLETE METABOLIC PANEL WITH GFR
ALT: 30 U/L — ABNORMAL HIGH (ref 6–29)
AST: 89 U/L — ABNORMAL HIGH (ref 10–35)
Albumin: 4.3 g/dL (ref 3.6–5.1)
Alkaline Phosphatase: 64 U/L (ref 33–130)
BUN: 7 mg/dL (ref 7–25)
CO2: 21 mmol/L (ref 20–31)
Calcium: 9.7 mg/dL (ref 8.6–10.4)
Chloride: 104 mmol/L (ref 98–110)
Creat: 0.73 mg/dL (ref 0.50–1.05)
GFR, Est African American: >89 mL/min (ref >=60)
GFR, Est Non African American: >89 mL/min (ref >=60)
Glucose, Bld: 76 mg/dL (ref 65–99)
Potassium: 4.1 mmol/L (ref 3.5–5.3)
Sodium: 138 mmol/L (ref 135–146)
Total Bilirubin: 3.2 mg/dL — ABNORMAL HIGH (ref 0.2–1.2)
Total Protein: 8 g/dL (ref 6.1–8.1)

## 2017-02-14 LAB — POCT URINALYSIS DIP (DEVICE)
Bilirubin Urine: NEGATIVE
Glucose, UA: NEGATIVE mg/dL
Ketones, ur: NEGATIVE mg/dL
Nitrite: NEGATIVE
Protein, ur: NEGATIVE mg/dL
Specific Gravity, Urine: 1.015 (ref 1.005–1.030)
Urobilinogen, UA: 2 mg/dL — ABNORMAL HIGH (ref 0.0–1.0)
pH: 7 (ref 5.0–8.0)

## 2017-02-14 LAB — RETICULOCYTES
ABS Retic: 231200 cells/uL — ABNORMAL HIGH (ref 20000–80000)
RBC.: 2.72 MIL/uL — ABNORMAL LOW (ref 3.80–5.10)
Retic Ct Pct: 8.5 %

## 2017-02-14 MED ORDER — OXYCODONE HCL 5 MG PO CAPS: 5.0000 mg | ORAL_CAPSULE | Freq: Four times a day (QID) | ORAL | 0 refills | Status: DC | PRN

## 2017-02-14 MED ORDER — PREDNISONE 10 MG PO TABS: 10.0000 mg | ORAL_TABLET | Freq: Every day | ORAL | 2 refills | Status: DC

## 2017-02-14 MED ORDER — IBUPROFEN 800 MG PO TABS
800.0000 mg | ORAL_TABLET | Freq: Three times a day (TID) | ORAL | 6 refills | Status: DC | PRN
Start: 2017-02-14 — End: 2017-08-22

## 2017-02-14 MED ORDER — FOLIC ACID 800 MCG PO TABS: 400.0000 ug | ORAL_TABLET | Freq: Every day | ORAL | 1 refills | Status: DC

## 2017-02-14 MED ORDER — GABAPENTIN 100 MG PO CAPS: 300.0000 mg | ORAL_CAPSULE | Freq: Every day | ORAL | 2 refills | Status: DC

## 2017-02-14 MED ORDER — CALCIUM CARB-CHOLECALCIFEROL 600-800 MG-UNIT PO CHEW: 1.0000 | CHEWABLE_TABLET | Freq: Every day | ORAL | 11 refills | Status: DC

## 2017-02-14 NOTE — Patient Instructions (Signed)
Please call in controlled prescriptions 3 days prior to refill due. Prescription pick-up days are Tuesday and Friday. I will process your referrals and you will be contacted regarding your appointments.     Sickle Cell Anemia, Adult Sickle cell anemia is a condition where your red blood cells are shaped like sickles. Red blood cells carry oxygen through the body. Sickle-shaped red blood cells do not live as long as normal red blood cells. They also clump together and block blood from flowing through the blood vessels. These things prevent the body from getting enough oxygen. Sickle cell anemia causes organ damage and pain. It also increases the risk of infection. Follow these instructions at home:  Drink enough fluid to keep your pee (urine) clear or pale yellow. Drink more in hot weather and during exercise.  Do not smoke. Smoking lowers oxygen levels in the blood.  Only take over-the-counter or prescription medicines as told by your doctor.  Take antibiotic medicines as told by your doctor. Make sure you finish them even if you start to feel better.  Take supplements as told by your doctor.  Consider wearing a medical alert bracelet. This tells anyone caring for you in an emergency of your condition.  When traveling, keep your medical information, doctors' names, and the medicines you take with you at all times.  If you have a fever, do not take fever medicines right away. This could cover up a problem. Tell your doctor.  Keep all follow-up visits with your doctor. Sickle cell anemia requires regular medical care. Contact a doctor if: You have a fever. Get help right away if:  You feel dizzy or faint.  You have new belly (abdominal) pain, especially on the left side near the stomach area.  You have a lasting, often uncomfortable and painful erection of the penis (priapism). If it is not treated right away, you will become unable to have sex (impotence).  You have numbness in  your arms or legs or you have a hard time moving them.  You have a hard time talking.  You have a fever or lasting symptoms for more than 2-3 days.  You have a fever and your symptoms suddenly get worse.  You have signs or symptoms of infection. These include: ? Chills. ? Being more tired than normal (lethargy). ? Irritability. ? Poor eating. ? Throwing up (vomiting).  You have pain that is not helped with medicine.  You have shortness of breath.  You have pain in your chest.  You are coughing up pus-like or bloody mucus.  You have a stiff neck.  Your feet or hands swell or have pain.  Your belly looks bloated.  Your joints hurt. This information is not intended to replace advice given to you by your health care provider. Make sure you discuss any questions you have with your health care provider. Document Released: 04/29/2013 Document Revised: 12/15/2015 Document Reviewed: 02/18/2013 Elsevier Interactive Patient Education  2017 [employer].

## 2017-02-14 NOTE — Progress Notes (Signed)
Patient ID: [name], female    DOB: [birth year], 56 y.o.   MRN: [id number]  PCP: Bing Neighbors, FNP  Chief Complaint  Patient presents with  . Establish Care    Subjective:  HPI [name] is a 56 y.o. female presents to establish care and hospital follow-up. Medical problems include: Sickle cell anemia, Hypothyroidism, Hepatitis C, History of Aneurysm, Rheumatoid Arthritis.  General medical history Sickle cell anemia patient reports she has not had any new pain medications in quite some time. She is presently taking an old expired prescription dated back from 2010 of Darovcet lower right ankle pain. She was previously receiving primary care in [facility]. Her last office visit 10/21/2015. She is currently take chronically folic acid, gabapentin, and Ibuprofen. Reports in the past for pain she has been prescribed Oxycodone and Percocet. [name] reports a history of a brain aneurysm which was clipped 1985 back in [place]. Denies any history of blood clots or pulmonary embolism. Last eye exam over 4 years ago and she reports a history of untreated glaucoma and retinopathy, Baseline hemoglobin 6-7. Last blood transfusion 02/05/2017. For Rheumatoid Arthritis she is prescribed chronic low prednisone. RA is in multiple joints and she was previously receiving a monthly infusion for treatment, however discontinued as insurance would no longer cover treatment. Last echocardiogram 12/04/2012 which indicated normal EF. She denies any episodic chest pain, shortness, or headaches.  Hospital follow-up [name] was recently admitted to [facility] for symptomatic anemia. Her baseline hemoglobin ranges within the low to mid 6's. During her most recent hospital admission her hemoglobin was found to be 4.8. She was transfused 1 unit of red blood cells and hemoglobin returned to baseline of 6.9. There is also suffers from right leg ulcer. She reports this is a chronic  problem. The one often heels and reopens. She is currently not followed by the wound care clinic. And her right lower leg and ankle is the source of her pain. PT referral was recommended at discharge as patient was having to ambulate with crutches due to pain was exacerbated by weightbearing of the right lower extremity. Patient reports that she is attempting to bear weight however continues to have problems with balance. She denies any recent falls, although notes she has to hold onto things to keep from falling at times.  Social History   Social History  . Marital status: Single    Spouse name: N/A  . Number of children: N/A  . Years of education: N/A   Occupational History  . Not on file.   Social History Main Topics  . Smoking status: Never Smoker  . Smokeless tobacco: Never Used  . Alcohol use No  . Drug use: No  . Sexual activity: Not Currently   Other Topics Concern  . Not on file   Social History Narrative  . No narrative on file    Family History  Problem Relation Age of Onset  . Cancer Mother 32       Esophageal  . Sickle cell trait Mother   . Sickle cell trait Father   . HIV/AIDS Brother    Review of Systems See HPI Patient Active Problem List   Diagnosis Date Noted  . Symptomatic anemia 02/06/2017  . Leg wound, right 02/06/2017  . Sickle cell crisis (HCC) 10/24/2015  . Anemia of chronic disease   . Hepatitis C 05/11/2014  . 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 Thrombophlebitis leg  superficial 03/04/2013  . Rheumatoid arthritis (HCC) 12/16/2012  . Hypothyroidism 11/27/2011    Allergies  Allergen Reactions  . Ampicillin     Per patient, she tolerates cephalosporins without problems  . Demerol [Meperidine] Other (See Comments)    unknow    Prior to Admission medications   Medication Sig Start Date End Date Taking? Authorizing Provider  Calcium Carb-Cholecalciferol 600-800 MG-UNIT CHEW Chew 1 tablet by mouth daily.   Yes [provider]  gabapentin (NEURONTIN) 100 MG capsule Take 100-300 mg by mouth at bedtime. 03/08/15  Yes [provider]  levothyroxine (SYNTHROID, LEVOTHROID) 175 MCG tablet Take 1 tablet by mouth daily. 01/29/17  Yes [provider]  predniSONE (DELTASONE) 10 MG tablet Take 1 tablet (10 mg total) by mouth daily. 12/16/12  Yes Altha Harm, MD  PROPOXYPHENE-ACETAMINOPHEN PO Take by mouth.   Yes [provider]  acetaminophen (TYLENOL) 500 MG tablet Take 1,000 mg by mouth every 6 (six) hours as needed for mild pain.    [provider]  Ascorbic Acid (VITAMIN C PO) Take 500 mg elemental calcium/kg/hr by mouth daily.    [provider]  benzonatate (TESSALON) 100 MG capsule Take 1 capsule (100 mg total) by mouth 3 (three) times daily as needed for cough. Patient not taking: Reported on 02/05/2017 10/26/15   Altha Harm, MD  dexlansoprazole (DEXILANT) 60 MG capsule Take 1 capsule (60 mg total) by mouth daily. Patient not taking: Reported on 02/05/2017 05/16/12   Gwenyth Bender, MD  folic acid (FOLVITE) 800 MCG tablet Take 0.5 tablets (400 mcg total) by mouth daily. Patient not taking: Reported on 02/14/2017 11/30/11   Larina Bras A, NP  ibuprofen (ADVIL,MOTRIN) 800 MG tablet Take 800 mg by mouth every 8 (eight) hours as needed for pain.    [provider]  levothyroxine (SYNTHROID, LEVOTHROID) 50 MCG tablet Take 50 mcg by mouth daily before breakfast.    [provider]  promethazine (PHENERGAN) 25 MG tablet Take 1 tablet (25 mg total) by mouth every 6 (six) hours as needed for nausea or vomiting. Patient not taking: Reported on 02/05/2017 05/05/14   Renne Crigler, PA-C  traMADol (ULTRAM) 50 MG tablet Take 1 tablet (50 mg total) by mouth every 6 (six) hours as needed for moderate pain. Patient not taking: Reported on 02/05/2017 10/26/15   Altha Harm, MD  Wound Dressings (INTRASITE GEL APPLIPAK) GEL Clean wound with  saline and pat dry. Apply gel to wound bed daily. Patient not taking: Reported on 02/14/2017 02/06/17   Altha Harm, MD    Past Medical, Surgical Family and Social History reviewed and updated.    Objective:   Today's Vitals   02/14/17 1042  BP: 132/68  Pulse: 64  Resp: 14  Temp: 98.5 F (36.9 C)  TempSrc: Oral  SpO2: 96%  Weight: 148 lb (67.1 kg)  Height: 5\' 11"  (1.803 m)    Wt Readings from Last 3 Encounters:  02/14/17 148 lb (67.1 kg)  02/06/17 147 lb 4.3 oz (66.8 kg)  02/04/17 150 lb (68 kg)     Depression screen [facility] 2/9 02/14/2017 05/11/2014 05/11/2014  Decreased Interest 2 3 1   Down, Depressed, Hopeless 3 2 1   PHQ - 2 Score 5 5 2   Altered sleeping 0 0 3  Tired, decreased energy 3 3 3   Change in appetite 0 0 3  Feeling bad or failure about yourself  3 0 3  Trouble concentrating 1 3 3  Moving slowly or fidgety/restless 0 0 3  Suicidal thoughts 0 0 3  PHQ-9 Score 12 11 23    Physical Exam  Constitutional: She is oriented to person, place, and time. She appears well-developed and well-nourished.  HENT:  Head: Normocephalic and atraumatic.  Mouth/Throat: Oropharynx is clear and moist.  Eyes: Pupils are equal, round, and reactive to light. Conjunctivae and EOM are normal.  Neck: Normal range of motion. Neck supple.  Cardiovascular: Normal rate, regular rhythm, normal heart sounds and intact distal pulses.   Pulmonary/Chest: Effort normal and breath sounds normal.  Abdominal: Soft. Bowel sounds are normal. She exhibits no distension. There is no tenderness.  Musculoskeletal: Normal range of motion.  Neurological: She is alert and oriented to person, place, and time.  Skin: Skin is warm and dry.  Psychiatric: She has a normal mood and affect. Her behavior is normal. Judgment and thought content normal.   Assessment & Plan:  1. Hb-SS disease without crisis (HCC) -Start L-Glutamine (Endari ) 15 g, twice daily.    Pulmonary evaluation - Patient denies  severe recurrent wheezes, shortness of breath with exercise, or persistent cough. If these symptoms develop, pulmonary function tests with spirometry will be ordered, and if abnormal, plan on referral to Pulmonology for further evaluation.  Eye - High risk of proliferative retinopathy. Annual eye exam with retinal exam recommended to patient, the patient has had eye exam this year. Ambulatory referral to opthalmology .  Immunization status - Yearly influenza vaccination is recommended, as well as being up to date with Meningococcal and Pneumococcal vaccines.   2. Rheumatoid arthritis involving multiple sites with positive rheumatoid factor (HCC) - predniSONE (DELTASONE) 10 MG tablet; Take 1 tablet (10 mg total) by mouth daily.  Dispense: 30 tablet; Refill: 2 -Ambulatory referral Rheumatology   3. Hypothyroidism, unspecified type - COMPLETE METABOLIC PANEL WITH GFR - Thyroid Panel With TSH  4. Chronic pain syndrome - Pain Mgmt, Profile 8 w/Conf, U -Oxycodone 5 mg, every 6 hours prn for pain. -Acute and chronic painful episodes - We agreed on Opiate dose and amount of pills  per month. We discussed that pt is to receive Schedule II prescriptions only from our clinic. Pt is also aware that the prescription history is available to [place] online through the [facility] CSRS. Controlled substance agreement reviewed and signed. We reminded  .[name] that all patients receiving Schedule II narcotics must be seen for follow within one month of prescription being requested. We reviewed the terms of our pain agreement, including the need to keep medicines in a safe locked location away from children or pets, and the need to report excess sedation or constipation, measures to avoid constipation, and policies related to early refills and stolen prescriptions. According to the [place] [facility] program, we have reviewed details related to analgesia, adverse effects and aberrant behaviors.   5. Ulcer of right  lower extremity with fat layer exposed (HCC) - AMB referral to wound care center  6. Abnormal Gait   Return in about 4 weeks for Sickle Cell Disease/Pain.  The patient was given clear instructions to go to ER or return to medical center if symptoms don't improve, worsen or new problems develop. The patient verbalized understanding. The patient was told to call to get lab results if they haven't heard anything in the next week.     Godfrey Pick. Tiburcio Pea, MSN, FNP-C [facility]  [address] [place], [place] [address] [phone]

## 2017-02-15 LAB — PAIN MGMT, PROFILE 8 W/CONF, U
6 Acetylmorphine: NEGATIVE ng/mL (ref <10)
Alcohol Metabolites: NEGATIVE ng/mL (ref <500)
Amphetamines: NEGATIVE ng/mL (ref <500)
Benzodiazepines: NEGATIVE ng/mL (ref <100)
Buprenorphine: NEGATIVE ng/mL (ref <5)
Cocaine Metabolite: NEGATIVE ng/mL (ref <150)
Creatinine: 91.3 mg/dL (ref >=20.0)
MDMA: NEGATIVE ng/mL (ref <500)
Marijuana Metabolite: NEGATIVE ng/mL (ref <20)
Opiates: NEGATIVE ng/mL (ref <100)
Oxidant: NEGATIVE ug/mL (ref <200)
Oxycodone: NEGATIVE ng/mL (ref <100)
Please note:: 0
pH: 6.85 (ref 4.5–9.0)

## 2017-02-15 LAB — THYROID PANEL WITH TSH
Free Thyroxine Index: 1.3 — ABNORMAL LOW (ref 1.4–3.8)
T3 Uptake: 21 % — ABNORMAL LOW (ref 22–35)
T4, Total: 6.4 ug/dL (ref 4.5–12.0)
TSH: 144.26 mIU/L — ABNORMAL HIGH

## 2017-02-15 MED ORDER — L-GLUTAMINE ORAL POWDER: 15.0000 g | PACK | Freq: Two times a day (BID) | ORAL | 3 refills | Status: DC

## 2017-02-15 NOTE — Addendum Note (Signed)
Addended by: Bing Neighbors on: 02/15/2017 01:46 PM   Modules accepted: Orders

## 2017-02-17 NOTE — ED Provider Notes (Signed)
[facility]-EMERGENCY DEPT Provider Note   CSN: [id number] Arrival date & time: 02/05/17  2005     History   Chief Complaint Chief Complaint  Patient presents with  . Ankle Pain    HPI [name] is a 56 y.o. female.  Patient presents for evaluation of painful sore to right ankle. She and daughter report it has been for there for months and is followed by the wound care clinic but tonight became more painful. No fever, nausea, vomiting. The patient has a history of sickle cell anemia and denies this is pain associated with a crisis. She has no other complaints.    The history is provided by the patient and a relative. No language interpreter was used.  Ankle Pain      Past Medical History:  Diagnosis Date  . Aneurysm (HCC)   . Anxiety   . Arthritis   . Colon ulcer   . Hypokalemia   . Hypothyroidism   . Pneumonia   . Sickle cell anemia (HCC)   . Ulcers of both lower extremities [facility])     Patient Active Problem List   Diagnosis Date Noted  . Symptomatic anemia 02/06/2017  . Leg wound, right 02/06/2017  . Sickle cell crisis (HCC) 10/24/2015  . Anemia of chronic disease   . Hepatitis C 05/11/2014  . 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 Thrombophlebitis leg superficial 03/04/2013  . Rheumatoid arthritis (HCC) 12/16/2012  . Hypothyroidism 11/27/2011    Past Surgical History:  Procedure Laterality Date  . ANKLE SURGERY  1989 and 1990  . CEREBRAL ANEURYSM REPAIR    . COLONOSCOPY  05/12/2012   Procedure: COLONOSCOPY;  Surgeon: Hart Carwin, MD;  Location: [facility]COPY;  Service: Endoscopy;  Laterality: N/A;  . ESOPHAGOGASTRODUODENOSCOPY  05/12/2012   Procedure: ESOPHAGOGASTRODUODENOSCOPY (EGD);  Surgeon: Hart Carwin, MD;  Location: [facility];  Service: Endoscopy;  Laterality: N/A;  . GIVENS CAPSULE STUDY  05/13/2012   Procedure: GIVENS CAPSULE STUDY;  Surgeon: Hart Carwin, MD;  Location: [facility];  Service: Endoscopy;  Laterality: N/A;    OB  History    No data available       Home Medications    Prior to Admission medications   Medication Sig Start Date End Date Taking? Authorizing Provider  acetaminophen (TYLENOL) 500 MG tablet Take 1,000 mg by mouth every 6 (six) hours as needed for mild pain.   Yes [provider]  Ascorbic Acid (VITAMIN C PO) Take 500 mg elemental calcium/kg/hr by mouth daily.   Yes [provider]  levothyroxine (SYNTHROID, LEVOTHROID) 175 MCG tablet Take 1 tablet by mouth daily. 01/29/17  Yes [provider]  levothyroxine (SYNTHROID, LEVOTHROID) 50 MCG tablet Take 50 mcg by mouth daily before breakfast.   Yes [provider]  benzonatate (TESSALON) 100 MG capsule Take 1 capsule (100 mg total) by mouth 3 (three) times daily as needed for cough. Patient not taking: Reported on 02/05/2017 10/26/15   Altha Harm, MD  Calcium Carb-Cholecalciferol 600-800 MG-UNIT CHEW Chew 1 tablet by mouth daily. 02/14/17   Bing Neighbors, FNP  dexlansoprazole (DEXILANT) 60 MG capsule Take 1 capsule (60 mg total) by mouth daily. Patient not taking: Reported on 02/05/2017 05/16/12   Gwenyth Bender, MD  folic acid (FOLVITE) 800 MCG tablet Take 0.5 tablets (400 mcg total) by mouth daily. 02/14/17   Bing Neighbors, FNP  gabapentin (NEURONTIN) 100 MG capsule Take 3 capsules (300 mg total) by mouth at  bedtime. 02/14/17   Bing Neighbors, FNP  ibuprofen (ADVIL,MOTRIN) 800 MG tablet Take 1 tablet (800 mg total) by mouth every 8 (eight) hours as needed. 02/14/17   Bing Neighbors, FNP  L-glutamine (ENDARI) 5 g PACK Powder Packet Take 15 g by mouth 2 (two) times daily. 02/15/17   Bing Neighbors, FNP  oxycodone (OXY-IR) 5 MG capsule Take 1 capsule (5 mg total) by mouth every 6 (six) hours as needed. 02/14/17   Bing Neighbors, FNP  predniSONE (DELTASONE) 10 MG tablet Take 1 tablet (10 mg total) by mouth daily. 02/14/17   Bing Neighbors, FNP  promethazine (PHENERGAN) 25 MG tablet  Take 1 tablet (25 mg total) by mouth every 6 (six) hours as needed for nausea or vomiting. Patient not taking: Reported on 02/05/2017 05/05/14   Renne Crigler, PA-C  PROPOXYPHENE-ACETAMINOPHEN PO Take by mouth.    [provider]  traMADol (ULTRAM) 50 MG tablet Take 1 tablet (50 mg total) by mouth every 6 (six) hours as needed for moderate pain. Patient not taking: Reported on 02/05/2017 10/26/15   Altha Harm, MD  Wound Dressings (INTRASITE GEL APPLIPAK) GEL Clean wound with saline and pat dry. Apply gel to wound bed daily. Patient not taking: Reported on 02/14/2017 02/06/17   Altha Harm, MD    Family History Family History  Problem Relation Age of Onset  . Cancer Mother 3       Esophageal  . Sickle cell trait Mother   . Sickle cell trait Father   . HIV/AIDS Brother     Social History Social History  Substance Use Topics  . Smoking status: Never Smoker  . Smokeless tobacco: Never Used  . Alcohol use No     Allergies   Ampicillin and Demerol [meperidine]   Review of Systems Review of Systems  Constitutional: Negative for chills and fever.  HENT: Negative.   Respiratory: Negative.   Cardiovascular: Negative.   Gastrointestinal: Negative.   Musculoskeletal:       See HPI.  Skin: Positive for wound.  Neurological: Negative.  Negative for light-headedness.     Physical Exam Updated Vital Signs BP 117/71 (BP Location: Right Arm)   Pulse 60   Temp 98.1 F (36.7 C) (Oral)   Resp 18   Ht 5\' 11"  (1.803 m)   Wt 66.8 kg (147 lb 4.3 oz)   SpO2 95%   BMI 20.54 kg/m   Physical Exam  Constitutional: She is oriented to person, place, and time. She appears well-developed and well-nourished.  Neck: Normal range of motion.  Pulmonary/Chest: Effort normal.  Musculoskeletal:  Right ankle has a circular wound to the medial aspect without erythema, drainage or malodor. There is minimal swelling.   Neurological: She is alert and oriented to person,  place, and time.  Skin: Skin is warm and dry.     ED Treatments / Results  Labs (all labs ordered are listed, but only abnormal results are displayed) Labs Reviewed  CBC WITH DIFFERENTIAL/PLATELET - Abnormal; Notable for the following:       Result Value   RBC 1.73 (*)    Hemoglobin 4.8 (*)    HCT 13.1 (*)    MCV 75.7 (*)    MCHC 36.6 (*)    RDW 27.1 (*)    nRBC 12 (*)    All other components within normal limits  BASIC METABOLIC PANEL - Abnormal; Notable for the following:    Potassium 3.3 (*)  All other components within normal limits  PROTIME-INR - Abnormal; Notable for the following:    Prothrombin Time 15.5 (*)    All other components within normal limits  BASIC METABOLIC PANEL - Abnormal; Notable for the following:    Potassium 3.4 (*)    All other components within normal limits  CBC - Abnormal; Notable for the following:    RBC 2.52 (*)    Hemoglobin 6.9 (*)    HCT 19.3 (*)    MCV 76.6 (*)    RDW 22.2 (*)    All other components within normal limits  LACTATE DEHYDROGENASE - Abnormal; Notable for the following:    LDH 602 (*)    All other components within normal limits  RETICULOCYTES - Abnormal; Notable for the following:    Retic Ct Pct 18.0 (*)    RBC. 1.75 (*)    Retic Count, Absolute 315.0 (*)    All other components within normal limits  APTT  HIV ANTIBODY (ROUTINE TESTING)  PREPARE RBC (CROSSMATCH)  TYPE AND SCREEN    EKG  EKG Interpretation None     CRITICAL CARE Performed by: Elpidio Anis A   Total critical care time: 30 minutes  Critical care time was exclusive of separately billable procedures and treating other patients.  Critical care was necessary to treat or prevent imminent or life-threatening deterioration.  Critical care was time spent personally by me on the following activities: development of treatment plan with patient and/or surrogate as well as nursing, discussions with consultants, evaluation of patient's response to  treatment, examination of patient, obtaining history from patient or surrogate, ordering and performing treatments and interventions, ordering and review of laboratory studies, ordering and review of radiographic studies, pulse oximetry and re-evaluation of patient's condition.   Radiology No results found.  Procedures Procedures (including critical care time)  Medications Ordered in ED Medications  oxyCODONE-acetaminophen (PERCOCET/ROXICET) 5-325 MG per tablet 1 tablet (1 tablet Oral Given 02/05/17 2325)     Initial Impression / Assessment and Plan / ED Course  I have reviewed the triage vital signs and the nursing notes.  Pertinent labs & imaging results that were available during my care of the patient were reviewed by me and considered in my medical decision making (see chart for details).     Patient presents with c/o ankle pain with non-healing ulceration x months for concern of infection. No evidence of infection present.   On lab studies, she is found to be significantly anemic to 4.8. On re-exam - there is conjunctival pallor. She is not tachycardic. She denies symptoms of anemia.  Discussed need to be admitted for transfusion. The patient agreed to transfusion, signed consent and procedure started in ED. Discussed with [facility] for admission.   Final Clinical Impressions(s) / ED Diagnoses   Final diagnoses:  Anemia, unspecified type    New Prescriptions Discharge Medication List as of 02/06/2017  4:53 PM    START taking these medications   Details  Wound Dressings (INTRASITE GEL APPLIPAK) GEL Clean wound with saline and pat dry. Apply gel to wound bed daily., Normal         Elpidio Anis, PA-C 02/17/17 8119    Elpidio Anis, PA-C 02/17/17 1478    Nira Conn, MD 02/17/17 [id number]

## 2017-02-28 DIAGNOSIS — M799 Soft tissue disorder, unspecified: Secondary | ICD-10-CM | POA: Diagnosis not present

## 2017-02-28 DIAGNOSIS — R262 Difficulty in walking, not elsewhere classified: Secondary | ICD-10-CM | POA: Diagnosis not present

## 2017-02-28 DIAGNOSIS — M545 Low back pain: Secondary | ICD-10-CM | POA: Diagnosis not present

## 2017-02-28 DIAGNOSIS — M79606 Pain in leg, unspecified: Secondary | ICD-10-CM | POA: Diagnosis not present

## 2017-03-07 DIAGNOSIS — R262 Difficulty in walking, not elsewhere classified: Secondary | ICD-10-CM | POA: Diagnosis not present

## 2017-03-07 DIAGNOSIS — M799 Soft tissue disorder, unspecified: Secondary | ICD-10-CM | POA: Diagnosis not present

## 2017-03-07 DIAGNOSIS — M545 Low back pain: Secondary | ICD-10-CM | POA: Diagnosis not present

## 2017-03-07 DIAGNOSIS — M79606 Pain in leg, unspecified: Secondary | ICD-10-CM | POA: Diagnosis not present

## 2017-03-11 DIAGNOSIS — M545 Low back pain: Secondary | ICD-10-CM | POA: Diagnosis not present

## 2017-03-11 DIAGNOSIS — M799 Soft tissue disorder, unspecified: Secondary | ICD-10-CM | POA: Diagnosis not present

## 2017-03-11 DIAGNOSIS — M79606 Pain in leg, unspecified: Secondary | ICD-10-CM | POA: Diagnosis not present

## 2017-03-11 DIAGNOSIS — R262 Difficulty in walking, not elsewhere classified: Secondary | ICD-10-CM | POA: Diagnosis not present

## 2017-03-12 ENCOUNTER — Encounter (HOSPITAL_BASED_OUTPATIENT_CLINIC_OR_DEPARTMENT_OTHER): Payer: Medicare Other | Attending: Surgery

## 2017-03-14 DIAGNOSIS — M799 Soft tissue disorder, unspecified: Secondary | ICD-10-CM | POA: Diagnosis not present

## 2017-03-14 DIAGNOSIS — M545 Low back pain: Secondary | ICD-10-CM | POA: Diagnosis not present

## 2017-03-14 DIAGNOSIS — M79606 Pain in leg, unspecified: Secondary | ICD-10-CM | POA: Diagnosis not present

## 2017-03-14 DIAGNOSIS — R262 Difficulty in walking, not elsewhere classified: Secondary | ICD-10-CM | POA: Diagnosis not present

## 2017-03-18 DIAGNOSIS — M799 Soft tissue disorder, unspecified: Secondary | ICD-10-CM | POA: Diagnosis not present

## 2017-03-18 DIAGNOSIS — M79606 Pain in leg, unspecified: Secondary | ICD-10-CM | POA: Diagnosis not present

## 2017-03-18 DIAGNOSIS — R262 Difficulty in walking, not elsewhere classified: Secondary | ICD-10-CM | POA: Diagnosis not present

## 2017-03-18 DIAGNOSIS — M545 Low back pain: Secondary | ICD-10-CM | POA: Diagnosis not present

## 2017-03-19 ENCOUNTER — Ambulatory Visit: Payer: Medicare Other | Admitting: Family Medicine

## 2017-03-20 ENCOUNTER — Other Ambulatory Visit: Payer: Self-pay | Admitting: Family Medicine

## 2017-03-20 NOTE — Progress Notes (Signed)
Patient was denied refer to [facility]. Please refer patient to [facility]. Please visit there website as there referrals are submitted electronically.  Godfrey Pick. Tiburcio Pea, MSN, FNP-C [facility]  [address] [place], [place] [address] [phone]

## 2017-03-21 DIAGNOSIS — M79606 Pain in leg, unspecified: Secondary | ICD-10-CM | POA: Diagnosis not present

## 2017-03-21 DIAGNOSIS — M799 Soft tissue disorder, unspecified: Secondary | ICD-10-CM | POA: Diagnosis not present

## 2017-03-21 DIAGNOSIS — R262 Difficulty in walking, not elsewhere classified: Secondary | ICD-10-CM | POA: Diagnosis not present

## 2017-03-21 DIAGNOSIS — M545 Low back pain: Secondary | ICD-10-CM | POA: Diagnosis not present

## 2017-04-25 DIAGNOSIS — R262 Difficulty in walking, not elsewhere classified: Secondary | ICD-10-CM | POA: Diagnosis not present

## 2017-04-25 DIAGNOSIS — M79606 Pain in leg, unspecified: Secondary | ICD-10-CM | POA: Diagnosis not present

## 2017-04-25 DIAGNOSIS — M799 Soft tissue disorder, unspecified: Secondary | ICD-10-CM | POA: Diagnosis not present

## 2017-04-25 DIAGNOSIS — M545 Low back pain: Secondary | ICD-10-CM | POA: Diagnosis not present

## 2017-05-08 DIAGNOSIS — M79606 Pain in leg, unspecified: Secondary | ICD-10-CM | POA: Diagnosis not present

## 2017-05-08 DIAGNOSIS — M799 Soft tissue disorder, unspecified: Secondary | ICD-10-CM | POA: Diagnosis not present

## 2017-05-08 DIAGNOSIS — R262 Difficulty in walking, not elsewhere classified: Secondary | ICD-10-CM | POA: Diagnosis not present

## 2017-05-08 DIAGNOSIS — M545 Low back pain: Secondary | ICD-10-CM | POA: Diagnosis not present

## 2017-05-09 DIAGNOSIS — M79606 Pain in leg, unspecified: Secondary | ICD-10-CM | POA: Diagnosis not present

## 2017-05-09 DIAGNOSIS — R262 Difficulty in walking, not elsewhere classified: Secondary | ICD-10-CM | POA: Diagnosis not present

## 2017-05-09 DIAGNOSIS — M545 Low back pain: Secondary | ICD-10-CM | POA: Diagnosis not present

## 2017-05-09 DIAGNOSIS — M799 Soft tissue disorder, unspecified: Secondary | ICD-10-CM | POA: Diagnosis not present

## 2017-08-22 ENCOUNTER — Other Ambulatory Visit: Payer: Self-pay | Admitting: Family Medicine

## 2017-08-22 ENCOUNTER — Other Ambulatory Visit: Payer: Self-pay

## 2017-08-22 ENCOUNTER — Emergency Department (HOSPITAL_COMMUNITY): Payer: Medicare Other

## 2017-08-22 ENCOUNTER — Emergency Department (HOSPITAL_COMMUNITY)
Admission: EM | Admit: 2017-08-22 | Discharge: 2017-08-22 | Disposition: A | Discharge status: Home / Self Care | Payer: Medicare Other | Attending: Emergency Medicine | Admitting: Emergency Medicine

## 2017-08-22 ENCOUNTER — Encounter (HOSPITAL_COMMUNITY): Payer: Self-pay | Admitting: Emergency Medicine

## 2017-08-22 DIAGNOSIS — M05632 Rheumatoid arthritis of left wrist with involvement of other organs and systems: Secondary | ICD-10-CM | POA: Diagnosis not present

## 2017-08-22 DIAGNOSIS — Z79899 Other long term (current) drug therapy: Secondary | ICD-10-CM | POA: Diagnosis not present

## 2017-08-22 DIAGNOSIS — M05642 Rheumatoid arthritis of left hand with involvement of other organs and systems: Secondary | ICD-10-CM | POA: Diagnosis not present

## 2017-08-22 DIAGNOSIS — M7989 Other specified soft tissue disorders: Secondary | ICD-10-CM | POA: Diagnosis not present

## 2017-08-22 DIAGNOSIS — D571 Sickle-cell disease without crisis: Secondary | ICD-10-CM | POA: Diagnosis not present

## 2017-08-22 DIAGNOSIS — M069 Rheumatoid arthritis, unspecified: Secondary | ICD-10-CM | POA: Insufficient documentation

## 2017-08-22 DIAGNOSIS — M25532 Pain in left wrist: Secondary | ICD-10-CM | POA: Diagnosis not present

## 2017-08-22 MED ORDER — PREDNISONE 5 MG PO TABS: 5.0000 mg | ORAL_TABLET | Freq: Every day | ORAL | 0 refills | Status: DC

## 2017-08-22 NOTE — ED Triage Notes (Signed)
Pt complaint of worsening left wrist pain since Monday related to hx of rheumatid arthritis. Pt verbalizes usually takes prednisone for it but has been out for a month.

## 2017-08-22 NOTE — ED Provider Notes (Signed)
Planes of left wrist pain for 1 week.  She is been noncompliant with prednisone for 1 month due to running out typical of pain from rheumatoid arthritis.  No fever.  No trauma.  No other associated symptoms.  On exam alert nontoxic upper extremity no redness no swelling radial pulse 2+.  Good capillary refill.  Full range of motion   Doug Sou, MD 08/22/17 1359

## 2017-08-22 NOTE — ED Provider Notes (Signed)
[facility]-EMERGENCY DEPT Provider Note   CSN: [id number] Arrival date & time: 08/22/17  1233     History   Chief Complaint Chief Complaint  Patient presents with  . Wrist Pain    HPI [name] is a 57 y.o. female with a history of rheumatoid arthritis, Hb-SS sickle cell anemia and hypothyroidism who presents to the emergency department with a chief complaint of left wrist and hand pain, onset 9 days ago, that has been constant, but significantly worsened last night.  She reports associated swelling and tenderness to the dorsum of the left hand and wrist, but no warmth or erythema.  No fever or chills.  She characterizes the pain as burning and states "it feels just like a rheumatoid arthritis flare."  She reports that she was previously on prednisone for 2 months, but ran out approximately 1 month ago.  She states that she tried to call her sickle cell physician, who manages her rheumatoid arthritis, but was unable to get in contact with the office.  She denies fever, chills, left elbow pain, right wrist or hand pain, numbness, or weakness.  No treatment prior to arrival.  She denies shortness of breath, chest pain, or fatigue.  She reorts that she was previously on Remicade infusions 3 years ago, followed by a rheumatologist at [facility], but discontinued the infusions because she said that Medicare would not pay for them.  No history of gout or septic joint.  The history is provided by the patient. No language interpreter was used.  Wrist Pain  Pertinent negatives include no chest pain, no abdominal pain, no headaches and no shortness of breath.    Past Medical History:  Diagnosis Date  . Aneurysm (HCC)   . Anxiety   . Arthritis   . Colon ulcer   . Hypokalemia   . Hypothyroidism   . Pneumonia   . Sickle cell anemia (HCC)   . Ulcers of both lower extremities [facility])     Patient Active Problem List   Diagnosis Date Noted  . Symptomatic anemia 02/06/2017   . Leg wound, right 02/06/2017  . Sickle cell crisis (HCC) 10/24/2015  . Anemia of chronic disease   . Hepatitis C 05/11/2014  . 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 Thrombophlebitis leg superficial 03/04/2013  . Rheumatoid arthritis (HCC) 12/16/2012  . Hypothyroidism 11/27/2011    Past Surgical History:  Procedure Laterality Date  . ANKLE SURGERY  1989 and 1990  . CEREBRAL ANEURYSM REPAIR    . COLONOSCOPY  05/12/2012   Procedure: COLONOSCOPY;  Surgeon: Hart Carwin, MD;  Location: [facility] ENDOSCOPY;  Service: Endoscopy;  Laterality: N/A;  . ESOPHAGOGASTRODUODENOSCOPY  05/12/2012   Procedure: ESOPHAGOGASTRODUODENOSCOPY (EGD);  Surgeon: Hart Carwin, MD;  Location: [facility];  Service: Endoscopy;  Laterality: N/A;  . GIVENS CAPSULE STUDY  05/13/2012   Procedure: GIVENS CAPSULE STUDY;  Surgeon: Hart Carwin, MD;  Location: [facility] ENDOSCOPY;  Service: Endoscopy;  Laterality: N/A;    OB History    No data available       Home Medications    Prior to Admission medications   Medication Sig Start Date End Date Taking? Authorizing Provider  acetaminophen (TYLENOL) 500 MG tablet Take 1,000 mg by mouth every 6 (six) hours as needed for mild pain.   Yes [provider]  Ascorbic Acid (VITAMIN C PO) Take 500 mg elemental calcium/kg/hr by mouth daily.   Yes [provider]  Calcium Carb-Cholecalciferol 600-800 MG-UNIT  CHEW Chew 1 tablet by mouth daily. 02/14/17  Yes Bing Neighbors, FNP  folic acid (FOLVITE) 800 MCG tablet Take 0.5 tablets (400 mcg total) by mouth daily. 02/14/17  Yes Bing Neighbors, FNP  Omega-3 Fatty Acids (FISH OIL PO) Take 1 capsule by mouth daily.   Yes [provider]  predniSONE (DELTASONE) 5 MG tablet Take 1 tablet (5 mg total) by mouth daily with breakfast. 08/22/17   Starling Christofferson A, PA-C    Family History Family History  Problem Relation Age of Onset  . Cancer Mother 39       Esophageal  . Sickle cell trait Mother     . Sickle cell trait Father   . HIV/AIDS Brother     Social History Social History   Tobacco Use  . Smoking status: Never Smoker  . Smokeless tobacco: Never Used  Substance Use Topics  . Alcohol use: No  . Drug use: No     Allergies   Ampicillin and Demerol [meperidine]   Review of Systems Review of Systems  Constitutional: Negative for activity change, chills and fever.  Respiratory: Negative for shortness of breath.   Cardiovascular: Negative for chest pain.  Gastrointestinal: Negative for abdominal pain.  Genitourinary: Negative for dysuria.  Musculoskeletal: Positive for arthralgias and joint swelling. Negative for back pain, myalgias, neck pain and neck stiffness.  Skin: Negative for rash.  Allergic/Immunologic: Negative for immunocompromised state.  Neurological: Negative for weakness, numbness and headaches.  Psychiatric/Behavioral: Negative for confusion.     Physical Exam Updated Vital Signs BP 140/85 (BP Location: Left Arm)   Pulse 78   Temp 98.2 F (36.8 C) (Oral)   Resp 16   SpO2 95%   Physical Exam  Constitutional: No distress.  HENT:  Head: Normocephalic.  Eyes: Conjunctivae are normal.  Neck: Neck supple.  Cardiovascular: Normal rate, regular rhythm and intact distal pulses. Exam reveals no gallop and no friction rub.  Murmur heard. Pulmonary/Chest: Effort normal. No stridor. No respiratory distress. She has no wheezes. She has no rales. She exhibits no tenderness.  Abdominal: Soft. She exhibits no distension.  Musculoskeletal:  Tender to palpation over the dorsum of the left hand and generalized tenderness to palpation to the left wrist increased pain with extension of the wrist and ulnar deviation.  Full active and passive range of motion of the left wrist and all digits of the left hand and left elbow.  No overlying warmth or erythema.  The second, third, and fourth MCP, and third PIP joints are mildly edematous, soft, and boggy.  Radial  pulses are 2+ and symmetric.  Sensation is intact throughout the bilateral upper extremities.  Neurological: She is alert.  Skin: Skin is warm. No rash noted.  Psychiatric: Her behavior is normal.  Nursing note and vitals reviewed.    ED Treatments / Results  Labs (all labs ordered are listed, but only abnormal results are displayed) Labs Reviewed - No data to display  EKG  EKG Interpretation None       Radiology Dg Wrist Complete Left  Result Date: 08/22/2017 CLINICAL DATA:  Left wrist pain and swelling which began 1 week ago and worsened last night. No known injury. EXAM: LEFT WRIST - COMPLETE 3+ VIEW COMPARISON:  Plain films left wrist 11/26/2011. FINDINGS: There new crowding of the carpal bones. Erosions are seen in all bones of the carpus and in the bases of the metacarpals. Bones appear osteopenic. Soft tissues about the wrist appear swollen. No fracture  or dislocation. IMPRESSION: Findings consistent with crystal or inflammatory arthropathy. Inflammatory arthropathy such as rheumatoid is favored. Soft tissue swelling about the wrist is consistent with synovitis. Negative for fracture. Electronically Signed   By: Drusilla Kanner M.D.   On: 08/22/2017 13:25    Procedures Procedures (including critical care time)  Medications Ordered in ED Medications - No data to display   Initial Impression / Assessment and Plan / ED Course  I have reviewed the triage vital signs and the nursing notes.  Pertinent labs & imaging results that were available during my care of the patient were reviewed by me and considered in my medical decision making (see chart for details).     57 year old female with a history of sickle cell anemia, rheumatoid arthritis, and hypothyroidism presenting with left wrist and hand pain for 9 days, that worsened last night.  She states that her symptoms feel just like a rheumatoid arthritis flare.  On physical exam, the second, third, and fourth MCPs and third  PIP joints are mildly edematous, soft, and boggy.  X-ray of the left wrist is suggestive of rheumatoid arthropathy and synovitis.  She has no constitutional symptoms and is afebrile and hemodynamically stable in the ED.  She has been out of her prednisone for the last month.  The patient was seen and evaluated by Dr. Rennis Chris, attending physician.  Doubt gout or septic arthritis at this time.  Will discharge the patient home with a 2-week course of her 5 mg prednisone that she has been out of for the last month and recommended follow-up to her sickle cell provider as soon as possible for reevaluation.  Strict return precautions given to the ED.  Recommended that she continue her home pain medication or take Tylenol for pain control.  She is hemodynamically stable and in no acute distress.  We will discharge the patient home at this time.  Final Clinical Impressions(s) / ED Diagnoses   Final diagnoses:  Rheumatoid arthritis flare (HCC)  Rheumatoid arthritis involving left wrist, unspecified rheumatoid factor presence (HCC)  Rheumatoid arthritis involving left hand, unspecified rheumatoid factor presence [facility])    ED Discharge Orders        Ordered    predniSONE (DELTASONE) 5 MG tablet  Daily with breakfast     08/22/17 1413       Kana Reimann A, PA-C 08/22/17 1438    Doug Sou, MD 08/22/17 1658

## 2017-08-22 NOTE — Discharge Instructions (Signed)
Please take 1 5 mg tablet of prednisone daily.  I have provided you with a 2-week course.  Please call Joaquin Courts this afternoon to schedule a follow-up appointment as soon as possible.  Please continue your home pain medication.  You can also take thousand milligrams of Tylenol every 8 hours to help with your pain.  If the left wrist or hand becomes red, hot, or swollen, if you develop constant shortness of breath that is worse with activity, fever, or chills, please return to the emergency department for reevaluation.

## 2017-08-23 ENCOUNTER — Encounter: Payer: Self-pay | Admitting: Family Medicine

## 2017-08-23 ENCOUNTER — Ambulatory Visit (INDEPENDENT_AMBULATORY_CARE_PROVIDER_SITE_OTHER): Payer: Medicare Other | Admitting: Family Medicine

## 2017-08-23 VITALS — BP 114/66 | HR 80 | Temp 98.7°F | Ht 71.0 in | Wt 154.0 lb

## 2017-08-23 DIAGNOSIS — E039 Hypothyroidism, unspecified: Secondary | ICD-10-CM | POA: Diagnosis not present

## 2017-08-23 DIAGNOSIS — M05731 Rheumatoid arthritis with rheumatoid factor of right wrist without organ or systems involvement: Secondary | ICD-10-CM | POA: Diagnosis not present

## 2017-08-23 DIAGNOSIS — D571 Sickle-cell disease without crisis: Secondary | ICD-10-CM | POA: Diagnosis not present

## 2017-08-23 DIAGNOSIS — D5700 Hb-SS disease with crisis, unspecified: Secondary | ICD-10-CM | POA: Diagnosis not present

## 2017-08-23 LAB — POCT URINALYSIS DIP (DEVICE)
Bilirubin Urine: NEGATIVE
Glucose, UA: NEGATIVE mg/dL
Ketones, ur: NEGATIVE mg/dL
Nitrite: NEGATIVE
Protein, ur: NEGATIVE mg/dL
Specific Gravity, Urine: 1.015 (ref 1.005–1.030)
Urobilinogen, UA: 2 mg/dL — ABNORMAL HIGH (ref 0.0–1.0)
pH: 7 (ref 5.0–8.0)

## 2017-08-23 MED ORDER — SILVER SULFADIAZINE 1 % EX CREA: 1.0000 "application " | TOPICAL_CREAM | Freq: Two times a day (BID) | CUTANEOUS | 0 refills | Status: DC

## 2017-08-23 MED ORDER — PREDNISONE 20 MG PO TABS: ORAL_TABLET | ORAL | 0 refills | Status: DC

## 2017-08-23 MED ORDER — CEPHALEXIN 500 MG PO CAPS: 500.0000 mg | ORAL_CAPSULE | Freq: Three times a day (TID) | ORAL | 0 refills | Status: AC

## 2017-08-23 NOTE — Progress Notes (Signed)
Patient ID: [name], female    DOB: 12/17/1960, 57 y.o.   MRN: [id number]  PCP: Bing Neighbors, FNP  Chief Complaint  Patient presents with  . Hospitalization Follow-up    Wrist pain    Subjective:  HPIDeveda Hall is a 57 y.o. female with a history of Sickle Cell Anemia, Rheumatoid Arthritis, Chronic Pain, Hypothyroidism presents for evaluation of right wrist swelling and fatigue. [name] has a history of medication non-compliance and has been lost to follow-up since July 2018. She recently presented to the ED yesterday with a complaint of wrist pain and swelling times 1 week. She has been taking prednisone chronically since for sometime and has been lost to follow-up with Rheumatologist and ran out of medication over 1 week ago. Reports gradually her right wrist began to eventually expanding to her entire hand and has become increasingly painful. She also complains that her chronic right leg ulcer has recently began to drain. [name] suffers from chronic stasis ulcers located to her bilateral lower legs. She has been followed by woundcare in past, however "no showed" prior scheduled appointment.  At present she complains of pain intensity of 7/10. She still has pain medication in which she refers to as "dope" and reports she only takes pain medications as needed. Current pain prescription she has was prescribed in July.  During her ED visit on yesterday labs were not drawn.  [name] suffers from sickle cell anemia with her last hemoglobin level on file is 7.4, was 6 months ago.  Currently not prescribed hydroxyurea.  Reports chronic fatigue.He has hypothyroidism and her last TSH level was 144.26 6 months ago.  The patient was contacted regarding abnormal labs however never returned any of our calls or responded to the unable to contact letter.  Denies chest pain, shortness of breath, dizziness, temperature intolerance, or new weakness. Social History   Socioeconomic History  . Marital status:  Single    Spouse name: Not on file  . Number of children: Not on file  . Years of education: Not on file  . Highest education level: Not on file  Social Needs  . Financial resource strain: Not on file  . Food insecurity - worry: Not on file  . Food insecurity - inability: Not on file  . Transportation needs - medical: Not on file  . Transportation needs - non-medical: Not on file  Occupational History  . Not on file  Tobacco Use  . Smoking status: Never Smoker  . Smokeless tobacco: Never Used  Substance and Sexual Activity  . Alcohol use: No  . Drug use: No  . Sexual activity: Not Currently  Other Topics Concern  . Not on file  Social History Narrative  . Not on file    Family History  Problem Relation Age of Onset  . Cancer Mother 64       Esophageal  . Sickle cell trait Mother   . Sickle cell trait Father   . HIV/AIDS Brother    Review of Systems  Constitutional: Positive for fatigue.  HENT: Negative.   Respiratory: Negative.   Cardiovascular: Negative.   Gastrointestinal: Negative.   Musculoskeletal: Positive for arthralgias and joint swelling.  Skin: Positive for wound.  Neurological: Negative.   Psychiatric/Behavioral: Negative.     Patient Active Problem List   Diagnosis Date Noted  . Symptomatic anemia 02/06/2017  . Leg wound, right 02/06/2017  . Sickle cell crisis (HCC) 10/24/2015  . Anemia of chronic disease   . Hepatitis  C 05/11/2014  . 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 Thrombophlebitis leg superficial 03/04/2013  . Rheumatoid arthritis (HCC) 12/16/2012  . Hypothyroidism 11/27/2011    Allergies  Allergen Reactions  . Ampicillin     Per patient, she tolerates cephalosporins without problems  . Demerol [Meperidine] Other (See Comments)    unknow    Prior to Admission medications   Medication Sig Start Date End Date Taking? Authorizing Provider  acetaminophen (TYLENOL) 500 MG tablet Take 1,000 mg by mouth every 6 (six) hours  as needed for mild pain.   Yes [provider]  Ascorbic Acid (VITAMIN C PO) Take 500 mg elemental calcium/kg/hr by mouth daily.   Yes [provider]  Calcium Carb-Cholecalciferol 600-800 MG-UNIT CHEW Chew 1 tablet by mouth daily. 02/14/17  Yes Bing Neighbors, FNP  folic acid (FOLVITE) 800 MCG tablet Take 0.5 tablets (400 mcg total) by mouth daily. 02/14/17  Yes Bing Neighbors, FNP  Omega-3 Fatty Acids (FISH OIL PO) Take 1 capsule by mouth daily.   Yes [provider]  predniSONE (DELTASONE) 5 MG tablet Take 1 tablet (5 mg total) by mouth daily with breakfast. 08/22/17  Yes McDonald, Coral Else, PA-C    Past Medical, Surgical Family and Social History reviewed and updated.    Objective:   Today's Vitals   08/23/17 1420  BP: 114/66  Pulse: 80  Temp: 98.7 F (37.1 C)  TempSrc: Oral  SpO2: 99%  Weight: 154 lb (69.9 kg)  Height: 5\' 11"  (1.803 m)    Wt Readings from Last 3 Encounters:  08/23/17 154 lb (69.9 kg)  02/14/17 148 lb (67.1 kg)  02/06/17 147 lb 4.3 oz (66.8 kg)   Physical Exam  Constitutional: She is oriented to person, place, and time. She appears well-developed and well-nourished.  HENT:  Head: Normocephalic and atraumatic.  Eyes: Conjunctivae are normal. Pupils are equal, round, and reactive to light.  Neck: Normal range of motion. Neck supple.  Cardiovascular: Normal rate, normal heart sounds and intact distal pulses.  Pulmonary/Chest: Effort normal and breath sounds normal.  Musculoskeletal:       Right wrist: She exhibits decreased range of motion, tenderness and swelling.       Right hand: She exhibits tenderness and swelling.  Lymphadenopathy:    She has no cervical adenopathy.  Neurological: She is alert and oriented to person, place, and time.  Skin: Skin is warm and dry.  Psychiatric: She has a normal mood and affect. Her behavior is normal. Judgment and thought content normal.   Assessment & Plan:  1. Hypothyroidism,  unspecified type, noncompliance with levothyroxine.  Recent has medication with her today which increases my suspicion that she is remained noncompliant with medication she has never followed up here in the office.  Repeat Thyroid Panel With TSH and adjust dose as warranted.   2. Rheumatoid arthritis involving right wrist with positive rheumatoid factor (HCC), will start patient on a prednisone taper.  Did not prescribe continuous prednisone as patient has persistently felt to follow-up with her rheumatologist and has been compliant in following up here in office.  Will check a sed rate and a CBC today.  Patient provided contact information to contact Dr. 02/08/17 to schedule an appointment for RA follow-up.   3. Hb-SS disease without crisis, chronic, no recent follow-up.  Hemoglobin 6 months ago was 7.4.   Patient attributes current pain to RA symptoms, not sickle cell.  Her last admission was February 05, 2017 in  which she was admitted for symptomatic anemia infused a total of 1 unit of packed red blood cells.  Will check CBC, CMP, Reticulocyte count today.   4. Chronic Leg Ulcer, right leg, is draining purulent type.  Will treat with Keflex 500 mg 3 times dailyx 7 days.  Patient reports that she is stopping after today's visit next door to the wound care center to schedule her visit   Meds ordered this encounter  Medications  . silver sulfADIAZINE (SILVADENE) 1 % cream    Sig: Apply 1 application topically 2 (two) times daily. Clean site of wound and apply cream.    Dispense:  50 g    Refill:  0    Order Specific Question:   Supervising Provider    Answer:   Quentin Angst [id number]  . cephALEXin (KEFLEX) 500 MG capsule    Sig: Take 1 capsule (500 mg total) by mouth 3 (three) times daily for 7 days.    Dispense:  21 capsule    Refill:  0    Order Specific Question:   Supervising Provider    Answer:   Quentin Angst [id number]  . predniSONE (DELTASONE) 20 MG tablet    Sig: Take  3 PO QAM x3days, 2 PO QAM x3days, 1 PO QAM x3days    Dispense:  18 tablet    Refill:  0    Order Specific Question:   Supervising Provider    Answer:   Quentin Angst [[id number]]    Orders Placed This Encounter  Procedures  . Thyroid Panel With TSH  . Sedimentation rate  . CBC with Differential  . Comprehensive metabolic panel  . POCT urinalysis dip (device)    Return for care in 2 weeks to evaluate hand swelling as well as leg ulcer.     Godfrey Pick. Tiburcio Pea, MSN, FNP-C [facility]  [address] [place], [place] [address] [phone]   .

## 2017-08-23 NOTE — Patient Instructions (Addendum)
Schedule an appointment with Dr. Sharmon Revere. Address: [address] [place], [place] [address] Phone: [phone]  Take Prednisone 20 mg,  in mornings with breakfast as follows:  Take 3 pills for 3 days, Take 2 pills for 3 days, and Take 1 pill for 3 days. Complete all medication. Once medication is complete, resume prednisone 5 mg daily.

## 2017-08-24 ENCOUNTER — Telehealth: Payer: Self-pay | Admitting: Family Medicine

## 2017-08-24 LAB — CBC WITH DIFFERENTIAL/PLATELET
Basophils Absolute: 0 10*3/uL (ref 0.0–0.2)
Basos: 0 %
EOS (ABSOLUTE): 0.1 10*3/uL (ref 0.0–0.4)
Eos: 1 %
Hematocrit: 16.6 % — CL (ref 34.0–46.6)
Hemoglobin: 5.5 g/dL — CL (ref 11.1–15.9)
Immature Grans (Abs): 0 10*3/uL (ref 0.0–0.1)
Immature Granulocytes: 0 %
Lymphocytes Absolute: 2.6 10*3/uL (ref 0.7–3.1)
Lymphs: 29 %
MCH: 25.9 pg — ABNORMAL LOW (ref 26.6–33.0)
MCHC: 33.1 g/dL (ref 31.5–35.7)
MCV: 78 fL — ABNORMAL LOW (ref 79–97)
Monocytes Absolute: 0.8 10*3/uL (ref 0.1–0.9)
Monocytes: 9 %
NRBC: 5 % — ABNORMAL HIGH (ref 0–0)
Neutrophils Absolute: 5.5 10*3/uL (ref 1.4–7.0)
Neutrophils: 61 %
Platelets: 267 10*3/uL (ref 150–379)
RBC: 2.12 x10E6/uL — CL (ref 3.77–5.28)
RDW: 25.2 % — ABNORMAL HIGH (ref 12.3–15.4)
WBC: 8.9 10*3/uL (ref 3.4–10.8)

## 2017-08-24 LAB — SEDIMENTATION RATE: Sed Rate: 2 mm/hr (ref 0–40)

## 2017-08-24 LAB — COMPREHENSIVE METABOLIC PANEL
ALT: 23 IU/L (ref 0–32)
AST: 68 IU/L — ABNORMAL HIGH (ref 0–40)
Albumin/Globulin Ratio: 1.1 — ABNORMAL LOW (ref 1.2–2.2)
Albumin: 4.2 g/dL (ref 3.5–5.5)
Alkaline Phosphatase: 74 IU/L (ref 39–117)
BUN/Creatinine Ratio: 7 — ABNORMAL LOW (ref 9–23)
BUN: 5 mg/dL — ABNORMAL LOW (ref 6–24)
Bilirubin Total: 2.7 mg/dL — ABNORMAL HIGH (ref 0.0–1.2)
CO2: 21 mmol/L (ref 20–29)
Calcium: 9.4 mg/dL (ref 8.7–10.2)
Chloride: 101 mmol/L (ref 96–106)
Creatinine, Ser: 0.69 mg/dL (ref 0.57–1.00)
GFR calc Af Amer: 112 mL/min/{1.73_m2} (ref >59)
GFR calc non Af Amer: 97 mL/min/{1.73_m2} (ref >59)
Globulin, Total: 3.7 g/dL (ref 1.5–4.5)
Glucose: 100 mg/dL — ABNORMAL HIGH (ref 65–99)
Potassium: 4 mmol/L (ref 3.5–5.2)
Sodium: 139 mmol/L (ref 134–144)
Total Protein: 7.9 g/dL (ref 6.0–8.5)

## 2017-08-24 LAB — THYROID PANEL WITH TSH
Free Thyroxine Index: 0.3 — ABNORMAL LOW (ref 1.2–4.9)
T3 Uptake Ratio: 12 % — ABNORMAL LOW (ref 24–39)
T4, Total: 2.4 ug/dL — ABNORMAL LOW (ref 4.5–12.0)
TSH: 143.5 u[IU]/mL — ABNORMAL HIGH (ref 0.450–4.500)

## 2017-08-24 MED ORDER — LEVOTHYROXINE SODIUM 150 MCG PO TABS: 150.0000 ug | ORAL_TABLET | Freq: Every day | ORAL | 0 refills | Status: DC

## 2017-08-24 MED ORDER — LEVOTHYROXINE SODIUM 25 MCG PO TABS: 25.0000 ug | ORAL_TABLET | Freq: Every day | ORAL | 0 refills | Status: DC

## 2017-08-24 NOTE — Telephone Encounter (Signed)
[name] 57-year-old female with a history of sickle cell anemia, rheumatoid arthritis, severe medication noncompliance, stasis ulcer and hypothyroidism, presented to the office on yesterday for an emergency department follow-up.  Received a critical lab that her current hemoglobin is 5.5 and her TSH is 143.50 . Contacted patient at [phone], received a voicemail message identifying phone number belonging to [facility]-after detailed message critical lab result and that patient will need to report to the emergency department.  Requested for patient to return phone call to verify receipt.  I will continue to him to reach patient via phone throughout the day.  [name] patient on levothyroxine 175 mcg which was the last dose she was prescribed in July.  Medication being E prescribed to pharmacy on file.

## 2017-08-24 NOTE — Telephone Encounter (Signed)
I have attempted to reach patient throughout the day today with no response.  Messages have been left on voicemail.  Patient was also provided at the office number to call me page through the answering service.  I will continue to attempt to reach patient throughout the day.    [name]. Tiburcio Pea, MSN, FNP-C The [facility]  [address] [place], [place] [address] [phone]

## 2017-08-25 ENCOUNTER — Telehealth: Payer: Self-pay | Admitting: Family Medicine

## 2017-08-25 NOTE — Telephone Encounter (Signed)
Attempted to reach patient again today without success. Contacted only listed personal contact which is the patient's mother and only received a voicemail. Left a message including office number advising that I urgently need to get in contact with patient.   [name]. Tiburcio Pea, MSN, FNP-C [facility]  [address] [place], [place] [address] [phone]

## 2017-08-26 ENCOUNTER — Telehealth: Payer: Self-pay | Admitting: Family Medicine

## 2017-08-26 NOTE — Telephone Encounter (Signed)
Sent patient and unable to contact letter I tried to contact her several times over the course of the last 3 days regarding critical lab results with no response from several voicemails which have been left.  Godfrey Pick. Tiburcio Pea, MSN, FNP-C [facility]  [address] [place], [place] [address] [phone]

## 2017-08-26 NOTE — Telephone Encounter (Signed)
Left vm for patient to call office regarding results

## 2017-08-28 ENCOUNTER — Telehealth: Payer: Self-pay

## 2017-08-30 ENCOUNTER — Telehealth: Payer: Self-pay

## 2017-09-05 ENCOUNTER — Telehealth: Payer: Self-pay

## 2017-09-05 NOTE — Telephone Encounter (Signed)
Patient returned called regarding critical labs and per Selena Batten patient will return to office tomorrow at 9am  to check levels on day hospital side and if levels are still critical patient may need a blood transfusion.

## 2017-09-06 ENCOUNTER — Other Ambulatory Visit: Payer: Self-pay | Admitting: Family Medicine

## 2017-09-06 ENCOUNTER — Ambulatory Visit (HOSPITAL_COMMUNITY)
Admission: RE | Admit: 2017-09-06 | Discharge: 2017-09-06 | Disposition: A | Discharge status: Home / Self Care | Payer: Medicare Other | Source: Ambulatory Visit | Attending: Family Medicine | Admitting: Family Medicine

## 2017-09-06 ENCOUNTER — Encounter: Payer: Self-pay | Admitting: Family Medicine

## 2017-09-06 ENCOUNTER — Encounter (HOSPITAL_BASED_OUTPATIENT_CLINIC_OR_DEPARTMENT_OTHER): Payer: Medicare Other | Attending: Internal Medicine

## 2017-09-06 DIAGNOSIS — D571 Sickle-cell disease without crisis: Secondary | ICD-10-CM | POA: Diagnosis not present

## 2017-09-06 DIAGNOSIS — D5700 Hb-SS disease with crisis, unspecified: Secondary | ICD-10-CM | POA: Insufficient documentation

## 2017-09-06 DIAGNOSIS — F17210 Nicotine dependence, cigarettes, uncomplicated: Secondary | ICD-10-CM | POA: Insufficient documentation

## 2017-09-06 DIAGNOSIS — Z7952 Long term (current) use of systemic steroids: Secondary | ICD-10-CM | POA: Insufficient documentation

## 2017-09-06 DIAGNOSIS — D649 Anemia, unspecified: Secondary | ICD-10-CM | POA: Diagnosis not present

## 2017-09-06 DIAGNOSIS — S81801A Unspecified open wound, right lower leg, initial encounter: Secondary | ICD-10-CM | POA: Diagnosis not present

## 2017-09-06 DIAGNOSIS — I87311 Chronic venous hypertension (idiopathic) with ulcer of right lower extremity: Secondary | ICD-10-CM | POA: Diagnosis not present

## 2017-09-06 DIAGNOSIS — M059 Rheumatoid arthritis with rheumatoid factor, unspecified: Secondary | ICD-10-CM | POA: Diagnosis not present

## 2017-09-06 DIAGNOSIS — L97811 Non-pressure chronic ulcer of other part of right lower leg limited to breakdown of skin: Secondary | ICD-10-CM | POA: Insufficient documentation

## 2017-09-06 LAB — CBC WITH DIFFERENTIAL/PLATELET
Basophils Absolute: 0 10*3/uL (ref 0.0–0.1)
Basophils Relative: 0 %
Eosinophils Absolute: 0.3 10*3/uL (ref 0.0–0.7)
Eosinophils Relative: 3 %
HCT: 16.2 % — ABNORMAL LOW (ref 36.0–46.0)
Hemoglobin: 5.8 g/dL — CL (ref 12.0–15.0)
Lymphocytes Relative: 28 %
Lymphs Abs: 2.5 10*3/uL (ref 0.7–4.0)
MCH: 28 pg (ref 26.0–34.0)
MCHC: 35.8 g/dL (ref 30.0–36.0)
MCV: 78.3 fL (ref 78.0–100.0)
Monocytes Absolute: 1.1 10*3/uL — ABNORMAL HIGH (ref 0.1–1.0)
Monocytes Relative: 12 %
Neutro Abs: 4.9 10*3/uL (ref 1.7–7.7)
Neutrophils Relative %: 57 %
Platelets: 188 10*3/uL (ref 150–400)
RBC: 2.07 MIL/uL — ABNORMAL LOW (ref 3.87–5.11)
RDW: 26.6 % — ABNORMAL HIGH (ref 11.5–15.5)
WBC: 8.8 10*3/uL (ref 4.0–10.5)
nRBC: 11 /100 WBC — ABNORMAL HIGH

## 2017-09-06 LAB — HEMOGLOBIN AND HEMATOCRIT, BLOOD
HCT: 20.7 % — ABNORMAL LOW (ref 36.0–46.0)
Hemoglobin: 7 g/dL — ABNORMAL LOW (ref 12.0–15.0)

## 2017-09-06 LAB — PREPARE RBC (CROSSMATCH)

## 2017-09-06 MED ORDER — ACETAMINOPHEN 325 MG PO TABS
650.0000 mg | ORAL_TABLET | Freq: Once | ORAL | Status: AC
  Administered 2017-09-06: 650 mg via ORAL
  Filled 2017-09-06: qty 2

## 2017-09-06 MED ORDER — SODIUM CHLORIDE 0.9 % IV SOLN
Freq: Once | INTRAVENOUS | Status: AC
  Administered 2017-09-06: 12:00:00 via INTRAVENOUS

## 2017-09-06 MED ORDER — DIPHENHYDRAMINE HCL 25 MG PO CAPS
25.0000 mg | ORAL_CAPSULE | Freq: Once | ORAL | Status: AC
  Administered 2017-09-06: 25 mg via ORAL
  Filled 2017-09-06: qty 1

## 2017-09-06 NOTE — Discharge Instructions (Signed)

## 2017-09-06 NOTE — Progress Notes (Signed)
Pt arrived for transfusion of 1 unit PRBCs; transfusion completed with no complications noted; pt alert,oriented, and ambulatory;  Ordering Provider: Julianne Handler, FNP Associated Diagnosis: Symptomatic anemia, Sickle cell crisis

## 2017-09-07 LAB — TYPE AND SCREEN
ABO/RH(D): B POS
Antibody Screen: NEGATIVE
Unit division: 0

## 2017-09-07 LAB — BPAM RBC
Blood Product Expiration Date: 201903142359
ISSUE DATE / TIME: 201902151146
Unit Type and Rh: 7300

## 2017-09-07 NOTE — Progress Notes (Signed)
[name], a 57 year old female with sickle cell anemia presented to clinic with a hemoglobin 5.5 on 08/23/2017. Hemoglobin is 5.8 on today, which is below baseline. Patient's baseline ranges from 7-8. Ms. Araki was transfused with 1 unit of packed red blood cells without complication. A hemoglobin and hematocrit was obtained 1 hour post transfusion, which is 7.0. Patient has a follow up scheduled on 09/09/2017 with Joaquin Courts, FNP.    Nolon Nations  MSN, FNP-C Patient Care [facility] [address] [place], [place] [address] [phone]

## 2017-09-09 ENCOUNTER — Emergency Department (HOSPITAL_COMMUNITY)
Admission: EM | Admit: 2017-09-09 | Discharge: 2017-09-09 | Disposition: A | Discharge status: Home / Self Care | Payer: Medicare Other | Attending: Emergency Medicine | Admitting: Emergency Medicine

## 2017-09-09 ENCOUNTER — Emergency Department (HOSPITAL_COMMUNITY): Payer: Medicare Other

## 2017-09-09 ENCOUNTER — Encounter (HOSPITAL_COMMUNITY): Payer: Self-pay | Admitting: Emergency Medicine

## 2017-09-09 ENCOUNTER — Ambulatory Visit: Payer: Medicare Other | Admitting: Family Medicine

## 2017-09-09 DIAGNOSIS — M069 Rheumatoid arthritis, unspecified: Secondary | ICD-10-CM | POA: Insufficient documentation

## 2017-09-09 DIAGNOSIS — R0789 Other chest pain: Secondary | ICD-10-CM | POA: Diagnosis not present

## 2017-09-09 DIAGNOSIS — R079 Chest pain, unspecified: Secondary | ICD-10-CM | POA: Diagnosis not present

## 2017-09-09 DIAGNOSIS — R0981 Nasal congestion: Secondary | ICD-10-CM | POA: Insufficient documentation

## 2017-09-09 DIAGNOSIS — Z79899 Other long term (current) drug therapy: Secondary | ICD-10-CM | POA: Insufficient documentation

## 2017-09-09 DIAGNOSIS — D5700 Hb-SS disease with crisis, unspecified: Secondary | ICD-10-CM | POA: Insufficient documentation

## 2017-09-09 DIAGNOSIS — Z86718 Personal history of other venous thrombosis and embolism: Secondary | ICD-10-CM | POA: Insufficient documentation

## 2017-09-09 DIAGNOSIS — R05 Cough: Secondary | ICD-10-CM | POA: Diagnosis not present

## 2017-09-09 DIAGNOSIS — I700 Atherosclerosis of aorta: Secondary | ICD-10-CM | POA: Diagnosis not present

## 2017-09-09 LAB — COMPREHENSIVE METABOLIC PANEL
ALT: 19 U/L (ref 14–54)
AST: 64 U/L — ABNORMAL HIGH (ref 15–41)
Albumin: 3.9 g/dL (ref 3.5–5.0)
Alkaline Phosphatase: 66 U/L (ref 38–126)
Anion gap: 11 (ref 5–15)
BUN: 9 mg/dL (ref 6–20)
CO2: 20 mmol/L — ABNORMAL LOW (ref 22–32)
Calcium: 9.3 mg/dL (ref 8.9–10.3)
Chloride: 107 mmol/L (ref 101–111)
Creatinine, Ser: 0.73 mg/dL (ref 0.44–1.00)
GFR calc Af Amer: >60 mL/min (ref >60)
GFR calc non Af Amer: >60 mL/min (ref >60)
Glucose, Bld: 141 mg/dL — ABNORMAL HIGH (ref 65–99)
Potassium: 3.7 mmol/L (ref 3.5–5.1)
Sodium: 138 mmol/L (ref 135–145)
Total Bilirubin: 2.9 mg/dL — ABNORMAL HIGH (ref 0.3–1.2)
Total Protein: 8 g/dL (ref 6.5–8.1)

## 2017-09-09 LAB — CBC WITH DIFFERENTIAL/PLATELET
Basophils Absolute: 0 10*3/uL (ref 0.0–0.1)
Basophils Relative: 0 %
Eosinophils Absolute: 0.1 10*3/uL (ref 0.0–0.7)
Eosinophils Relative: 1 %
HCT: 20.3 % — ABNORMAL LOW (ref 36.0–46.0)
Hemoglobin: 7 g/dL — ABNORMAL LOW (ref 12.0–15.0)
Lymphocytes Relative: 9 %
Lymphs Abs: 1.1 10*3/uL (ref 0.7–4.0)
MCH: 27.1 pg (ref 26.0–34.0)
MCHC: 34.5 g/dL (ref 30.0–36.0)
MCV: 78.7 fL (ref 78.0–100.0)
Monocytes Absolute: 0.7 10*3/uL (ref 0.1–1.0)
Monocytes Relative: 6 %
Neutro Abs: 9.9 10*3/uL — ABNORMAL HIGH (ref 1.7–7.7)
Neutrophils Relative %: 84 %
Platelets: 201 10*3/uL (ref 150–400)
RBC: 2.58 MIL/uL — ABNORMAL LOW (ref 3.87–5.11)
RDW: 24.7 % — ABNORMAL HIGH (ref 11.5–15.5)
WBC: 11.8 10*3/uL — ABNORMAL HIGH (ref 4.0–10.5)
nRBC: 4 /100 WBC — ABNORMAL HIGH

## 2017-09-09 LAB — I-STAT TROPONIN, ED
Troponin i, poc: 0 ng/mL (ref 0.00–0.08)
Troponin i, poc: 0 ng/mL (ref 0.00–0.08)

## 2017-09-09 LAB — RETICULOCYTES
RBC.: 2.58 MIL/uL — ABNORMAL LOW (ref 3.87–5.11)
Retic Count, Absolute: 374.1 10*3/uL — ABNORMAL HIGH (ref 19.0–186.0)
Retic Ct Pct: 14.5 % — ABNORMAL HIGH (ref 0.4–3.1)

## 2017-09-09 LAB — I-STAT BETA HCG BLOOD, ED (MC, WL, AP ONLY): I-stat hCG, quantitative: <5 m[IU]/mL (ref <5)

## 2017-09-09 MED ORDER — IOPAMIDOL (ISOVUE-370) INJECTION 76%
INTRAVENOUS | Status: AC
  Filled 2017-09-09: qty 100

## 2017-09-09 MED ORDER — DEXTROSE-NACL 5-0.45 % IV SOLN
INTRAVENOUS | Status: DC
  Administered 2017-09-09: 23:00:00 via INTRAVENOUS

## 2017-09-09 MED ORDER — IOPAMIDOL (ISOVUE-370) INJECTION 76%
100.0000 mL | Freq: Once | INTRAVENOUS | Status: AC | PRN
  Administered 2017-09-09: 100 mL via INTRAVENOUS

## 2017-09-09 MED ORDER — IBUPROFEN 600 MG PO TABS: 600.0000 mg | ORAL_TABLET | Freq: Four times a day (QID) | ORAL | 0 refills | Status: DC | PRN

## 2017-09-09 MED ORDER — ACETAMINOPHEN 325 MG PO TABS: 650.0000 mg | ORAL_TABLET | Freq: Four times a day (QID) | ORAL | 0 refills | Status: DC | PRN

## 2017-09-09 MED ORDER — HYDROMORPHONE HCL 1 MG/ML IJ SOLN
0.5000 mg | Freq: Once | INTRAMUSCULAR | Status: AC
  Administered 2017-09-09: 0.5 mg via INTRAVENOUS
  Filled 2017-09-09: qty 1

## 2017-09-09 NOTE — ED Provider Notes (Signed)
Medical screening examination/treatment/procedure(s) were conducted as a shared visit with non-physician practitioner(s) and myself.  I personally evaluated the patient during the encounter.   EKG Interpretation  Date/Time:  Monday September 09 2017 19:47:34 EST Ventricular Rate:  66 PR Interval:    QRS Duration: 106 QT Interval:  441 QTC Calculation: 463 R Axis:   63 Text Interpretation:  Sinus rhythm Low voltage, extremity leads No significant change since last tracing Confirmed by Lorre Nick ([id number]) on 09/09/2017 8:37:56 PM     57 year old female presents with sharp left-sided chest and back pain times yesterday.  History of sickle cell disease.  Denies any CHF or anginal symptoms.  Chest CT negative for PE here.  Was medicated with Dilaudid and feels better.  Her EKG is unchanged.  Patient stable for discharge and she is pain-free   Lorre Nick, MD 09/09/17 2253

## 2017-09-09 NOTE — ED Triage Notes (Signed)
Patient c/o sharp pain radiating from neck to left rib since yesterday. Denies SOB, N/V/D. States pain increases with inspiration. Hx sickle cell. States pain does not mimic typical sickle cell pain. Ambulatory.

## 2017-09-09 NOTE — ED Provider Notes (Signed)
[facility]-EMERGENCY DEPT Provider Note   CSN: [id number] Arrival date & time: 09/09/17  1351     History   Chief Complaint Chief Complaint  Patient presents with  . Chest Pain    HPI Eletha Behne is a 57 y.o. female.  HPI   Patient is a 57 year old female with history of brain aneurysm and sickle cell presents the ED today complaining of 8/10 left-sided chest pain that woke her up from sleep yesterday.  States pain is sharp in nature and radiates down the anterior and lateral left chest wall as well as to the left side of her back and left shoulder.  Pain is worse when she takes a deep breath, and she feels that she needs to breathe shallow because of his pain.  He denies any overt shortness of breath.  Does report a cough for the last 2 weeks as well as nasal drainage.  No fever, sore throat, or other URI symptoms.  No abdominal pain, NVD, diaphoresis.  States that she has had this same pain in the past and was admitted to the hospital for pain control.  She does not remember what her diagnosis was during that time.  States that this pain is different from her normal sickle cell pain.  States that she has taken Tylenol with no relief.  Also tried oxycodone 5 mg with no relief.  States she does not normally take pain medication other than ibuprofen 800 mg at home.  Review of records it appears that patient had blood transfusion 3 days ago, 1 unit.  Patient is baseline ranges from 7-8.  Past Medical History:  Diagnosis Date  . Aneurysm (HCC)   . Anxiety   . Arthritis   . Colon ulcer   . Hypokalemia   . Hypothyroidism   . Pneumonia   . Sickle cell anemia (HCC)   . Ulcers of both lower extremities [facility])     Patient Active Problem List   Diagnosis Date Noted  . Symptomatic anemia 02/06/2017  . Leg wound, right 02/06/2017  . Sickle cell crisis (HCC) 10/24/2015  . Anemia of chronic disease   . Hepatitis C 05/11/2014  . 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 Thrombophlebitis leg superficial 03/04/2013  . Rheumatoid arthritis (HCC) 12/16/2012  . Hypothyroidism 11/27/2011    Past Surgical History:  Procedure Laterality Date  . ANKLE SURGERY  1989 and 1990  . CEREBRAL ANEURYSM REPAIR    . COLONOSCOPY  05/12/2012   Procedure: COLONOSCOPY;  Surgeon: Hart Carwin, MD;  Location: [facility] ENDOSCOPY;  Service: Endoscopy;  Laterality: N/A;  . ESOPHAGOGASTRODUODENOSCOPY  05/12/2012   Procedure: ESOPHAGOGASTRODUODENOSCOPY (EGD);  Surgeon: Hart Carwin, MD;  Location: [facility];  Service: Endoscopy;  Laterality: N/A;  . GIVENS CAPSULE STUDY  05/13/2012   Procedure: GIVENS CAPSULE STUDY;  Surgeon: Hart Carwin, MD;  Location: [facility] ENDOSCOPY;  Service: Endoscopy;  Laterality: N/A;    OB History    No data available       Home Medications    Prior to Admission medications   Medication Sig Start Date End Date Taking? Authorizing Provider  Ascorbic Acid (VITAMIN C PO) Take 500 mg elemental calcium/kg/hr by mouth daily.   Yes [provider]  Calcium Carb-Cholecalciferol 600-800 MG-UNIT CHEW Chew 1 tablet by mouth daily. 02/14/17  Yes Bing Neighbors, FNP  folic acid (FOLVITE) 800 MCG tablet Take 0.5 tablets (400 mcg total) by mouth daily. 02/14/17  Yes Bing Neighbors,  FNP  Omega-3 Fatty Acids (FISH OIL PO) Take 1 capsule by mouth daily.   Yes [provider]  OXYCODONE HCL PO Take 5 mg by mouth every 6 (six) hours as needed (pain).   Yes [provider]  predniSONE (DELTASONE) 5 MG tablet Take 1 tablet (5 mg total) by mouth daily with breakfast. 08/22/17  Yes McDonald, Mia A, PA-C  silver sulfADIAZINE (SILVADENE) 1 % cream Apply 1 application topically 2 (two) times daily. Clean site of wound and apply cream. 08/23/17  Yes Bing Neighbors, FNP  acetaminophen (TYLENOL) 325 MG tablet Take 2 tablets (650 mg total) by mouth every 6 (six) hours as needed. Do not take more than 4000mg  of tylenol per day 09/09/17    Asad Keeven S, PA-C  ibuprofen (ADVIL,MOTRIN) 600 MG tablet Take 1 tablet (600 mg total) by mouth every 6 (six) hours as needed. 09/09/17   Malaysha Arlen S, PA-C  levothyroxine (SYNTHROID) 150 MCG tablet Take 1 tablet (150 mcg total) by mouth daily before breakfast. 08/24/17   Bing Neighbors, FNP  levothyroxine (SYNTHROID, LEVOTHROID) 25 MCG tablet Take 1 tablet (25 mcg total) by mouth daily before breakfast. 08/24/17   Bing Neighbors, FNP  predniSONE (DELTASONE) 20 MG tablet Take 3 PO QAM x3days, 2 PO QAM x3days, 1 PO QAM x3days Patient not taking: Reported on 09/09/2017 08/23/17   Bing Neighbors, FNP    Family History Family History  Problem Relation Age of Onset  . Cancer Mother 15       Esophageal  . Sickle cell trait Mother   . Sickle cell trait Father   . HIV/AIDS Brother     Social History Social History   Tobacco Use  . Smoking status: Never Smoker  . Smokeless tobacco: Never Used  Substance Use Topics  . Alcohol use: No  . Drug use: No     Allergies   Ampicillin and Demerol [meperidine]   Review of Systems Review of Systems  Constitutional: Negative for chills and fever.  HENT: Positive for rhinorrhea. Negative for congestion, ear pain, sinus pressure, sinus pain and sore throat.   Eyes: Negative for pain and visual disturbance.  Respiratory: Positive for cough. Negative for shortness of breath.   Cardiovascular: Positive for chest pain. Negative for palpitations and leg swelling.  Gastrointestinal: Negative for abdominal pain, diarrhea, nausea and vomiting.  Genitourinary: Negative for dysuria and hematuria.  Musculoskeletal:       Right shoulder pain, right rib pain and right back pain  Skin: Negative for color change and rash.  Neurological: Negative for seizures and syncope.  All other systems reviewed and are negative.    Physical Exam Updated Vital Signs BP 129/72 (BP Location: Right Arm)   Pulse (!) 57   Temp 99.6 F (37.6 C) (Oral)    Resp 13   SpO2 98%   Physical Exam  Constitutional: She is oriented to person, place, and time. She appears well-developed and well-nourished. No distress.  Patient in mild distress.  HENT:  Head: Normocephalic and atraumatic.  No pharyngeal erythema.  No tonsillar swelling or exudates.  Uvula midline.  Eyes: Conjunctivae and EOM are normal. Pupils are equal, round, and reactive to light.  Neck: Neck supple. No JVD present.  Cardiovascular: Normal rate, regular rhythm and intact distal pulses.  No murmur heard. Pulmonary/Chest: Effort normal and breath sounds normal. No tachypnea. No respiratory distress. She has no decreased breath sounds. She has no wheezes. She has no rhonchi. She  has no rales.  Patient has tenderness to the anterior chest diffusely as well as to the lateral and posterior ribs.  Abdominal: Soft. There is no tenderness.  Musculoskeletal: Normal range of motion. She exhibits no edema.  TTP of the left trapezius muscle. No midline tenderness.  No CVA tenderness. No calf TTP, erythema, swelling.     Neurological: She is alert and oriented to person, place, and time.  Mental Status:  Alert, thought content appropriate, able to give a coherent history. Speech fluent without evidence of aphasia. Able to follow 2 step commands without difficulty.  Cranial Nerves:  II:  pupils equal, round, reactive to light III,IV, VI: ptosis not present, extra-ocular motions intact bilaterally  V,VII: smile symmetric, facial light touch sensation equal VIII: hearing grossly normal to voice  X: uvula elevates symmetrically  XI: bilateral shoulder shrug symmetric and strong XII: midline tongue extension without fassiculations Motor:  Normal tone. 5/5 strength of BUE and BLE major muscle groups including strong and equal grip strength and dorsiflexion/plantar flexion Sensory: light touch normal in all extremities. CV: 2+ radial and DP/PT pulses  Skin: Skin is warm and dry. Capillary refill  takes less than 2 seconds.  Psychiatric: She has a normal mood and affect.  Nursing note and vitals reviewed.    ED Treatments / Results  Labs (all labs ordered are listed, but only abnormal results are displayed) Labs Reviewed  COMPREHENSIVE METABOLIC PANEL - Abnormal; Notable for the following components:      Result Value   CO2 20 (*)    Glucose, Bld 141 (*)    AST 64 (*)    Total Bilirubin 2.9 (*)    All other components within normal limits  CBC WITH DIFFERENTIAL/PLATELET - Abnormal; Notable for the following components:   WBC 11.8 (*)    RBC 2.58 (*)    Hemoglobin 7.0 (*)    HCT 20.3 (*)    RDW 24.7 (*)    nRBC 4 (*)    Neutro Abs 9.9 (*)    All other components within normal limits  RETICULOCYTES - Abnormal; Notable for the following components:   Retic Ct Pct 14.5 (*)    RBC. 2.58 (*)    Retic Count, Absolute 374.1 (*)    All other components within normal limits  I-STAT BETA HCG BLOOD, ED ([facility], [facility], [facility] ONLY)  I-STAT TROPONIN, ED  I-STAT BETA HCG BLOOD, ED ([facility], [facility], [facility] ONLY)  I-STAT TROPONIN, ED    EKG  EKG Interpretation  Date/Time:  Monday September 09 2017 19:47:34 EST Ventricular Rate:  66 PR Interval:    QRS Duration: 106 QT Interval:  441 QTC Calculation: 463 R Axis:   63 Text Interpretation:  Sinus rhythm Low voltage, extremity leads No significant change since last tracing Confirmed by Lorre Nick ([id number]) on 09/09/2017 8:37:04 PM       Radiology Dg Chest 2 View  Result Date: 09/09/2017 CLINICAL DATA:  Chest pain EXAM: CHEST  2 VIEW COMPARISON:  02/06/2017 FINDINGS: Low volume chest with indistinct opacities at the left more than right base, stable. Chronic cardiomegaly. Stable mediastinal contours. No Kerley lines, effusion, or pneumothorax. Chronic calcification along the right rotator cuff. IMPRESSION: 1. Chronic streaky opacities at the bases compatible with scar. No definite acute disease. 2. Chronic cardiomegaly. Electronically Signed   By:  Marnee Spring M.D.   On: 09/09/2017 14:46   Ct Angio Chest Pe W And/or Wo Contrast  Result Date: 09/09/2017 CLINICAL DATA:  Chest pain  since yesterday. History of sickle cell disease. EXAM: CT ANGIOGRAPHY CHEST WITH CONTRAST TECHNIQUE: Multidetector CT imaging of the chest was performed using the standard protocol during bolus administration of intravenous contrast. Multiplanar CT image reconstructions and MIPs were obtained to evaluate the vascular anatomy. CONTRAST:  ISOVUE-370 IOPAMIDOL (ISOVUE-370) INJECTION 76% COMPARISON:  Chest CT 11/27/2011 FINDINGS: Cardiovascular: The heart is enlarged, most particularly the left atrium. No pericardial effusion. The aorta is normal in caliber. No dissection. Minimal atherosclerotic calcifications. No obvious coronary artery calcifications. The pulmonary arterial tree is fairly well opacified. Prominent pulmonary arteries, particularly on the left which measures a maximum of 2.6 cm. No filling defects to suggest pulmonary embolism. Prominent pulmonary vasculature. Mediastinum/Nodes: No mediastinal or hilar mass or adenopathy. Lungs/Pleura: Prominent pulmonary vessels but no pulmonary edema or pleural effusions. No focal infiltrates and no worrisome pulmonary lesions. A few tiny calcified granulomas are noted. Upper Abdomen: No significant upper abdominal findings. Musculoskeletal: No breast masses, supraclavicular or axillary adenopathy. Scattered lymph nodes are noted but appears stable. No significant bony findings. Review of the MIP images confirms the above findings. IMPRESSION: 1. Cardiac enlargement and prominent pulmonary vasculature likely related to the patient's sickle cell disease. 2. No CT findings for pulmonary embolism. 3. No aortic aneurysm or dissection. 4. Prominent pulmonary vasculature but no overt pulmonary edema. Bibasilar atelectasis noted. Aortic Atherosclerosis (ICD10-I70.0). Electronically Signed   By: Rudie Meyer M.D.   On: 09/09/2017  21:59    Procedures Procedures (including critical care time)  Medications Ordered in ED Medications  iopamidol (ISOVUE-370) 76 % injection (not administered)  dextrose 5 %-0.45 % sodium chloride infusion ( Intravenous Stopped 09/09/17 2317)  HYDROmorphone (DILAUDID) injection 0.5 mg (0.5 mg Intravenous Given 09/09/17 2114)  iopamidol (ISOVUE-370) 76 % injection 100 mL (100 mLs Intravenous Contrast Given 09/09/17 2142)     Initial Impression / Assessment and Plan / ED Course  I have reviewed the triage vital signs and the nursing notes.  Pertinent labs & imaging results that were available during my care of the patient were reviewed by me and considered in my medical decision making (see chart for details).  Discussed pt presentation and exam findings with Dr. Freida Busman, who agrees with the current workup and plan.   Rechecked patient.  She says that her pain is completely resolved after 0.5 mg of Dilaudid.  Patient sitting in bed in no acute distress.  Heart rate 70.  O2 sat 95%.     Final Clinical Impressions(s) / ED Diagnoses   Final diagnoses:  Nonspecific chest pain  Sickle cell crisis (HCC)   57 year old female with history of sickle cell presents the ED today complaining of chest pain that began yesterday.  Also with cough and recent URI sxs.  Patient symptoms most concerning for PE.  Will order CTA to rule this out.  Chest x-ray with chronic opacities but no acute disease.  Chronic cardiomegaly.  CTA with cardiac enlargement.  No evidence of PE and no aortic aneurysm or dissection.  Prominent pulmonary vascular markings but no overt pulmonary edema.  Bibasilar atelectasis.  CMP with mildly low CO2 to 20, likely due to hyperventilation from increased pain, however overall reassuring and at baseline for patient. CBC with mildly elevated WBC count to 11.8, however remainder of CBC appears to be at baseline. Hgb is 7 and is baseline for pt.  Reticulocyte count elevated consistent  with sickle cell pain crisis.   Given negative CTA, pts symptoms seem most consistent with pleurisy given  her recent h/o URI sxs. She is afebrile today and has normal vital signs. No lower extremity swelling or dyspnea on exertion.  No JVD or new murmur.  Regular rate and rhythm and breath sounds equal bilaterally. She is satting well on RA and is no distress with complete resolution of pain after 0.5mg  of dilaudid. Chest pain is not likely of cardiac nature today as her EKGs do not show any ischemic changes and did not show any significant changes from prior. Low likelihood of pericarditis or myocarditis.  Troponin negative x2.    Patient is to be discharged with recommendation to follow up with sickle cell clinic for further treatment of her pain.  She is to follow-up with her primary care doctor as well within the next 2-3 days.  She was given strict precautions to return to the ED if CP becomes exertional, associated with diaphoresis or nausea, radiates to left jaw/arm, worsens or becomes concerning in any way. Pt appears reliable for follow up and is agreeable to discharge.   Case has been discussed with and seen by Dr. Freida Busman who personally evaluated the patient, reviewed the workup and results and agrees with the above plan to discharge.   ED Discharge Orders        Ordered    ibuprofen (ADVIL,MOTRIN) 600 MG tablet  Every 6 hours PRN     09/09/17 2258    acetaminophen (TYLENOL) 325 MG tablet  Every 6 hours PRN     09/09/17 2258       Amiayah Giebel S, PA-C 09/09/17 2359    Ahron Hulbert S, PA-C 09/10/17 0002

## 2017-09-09 NOTE — Discharge Instructions (Signed)
You may take over the counter pain medications such as tylenol and ibuprofen to treat your symptoms. Please follow up with the sickle cell crisis clinic within the next 1-2 days.  Also follow-up with your primary care provider for reevaluation of your symptoms.  If you do not have a primary care provider, information for a healthcare clinic has been provided for you to make arrangements for follow up care. Please return to the emergency department for any new or worsening symptoms including any changes in your chest pain, persistent chest pain, shortness of breath, fevers, or any new or worsening symptoms.

## 2017-09-09 NOTE — ED Notes (Signed)
Pt SpO2 didn't drop below 94% while ambulating, did not complain of SOB or difficulty breathing

## 2017-09-17 DIAGNOSIS — S91001A Unspecified open wound, right ankle, initial encounter: Secondary | ICD-10-CM | POA: Diagnosis not present

## 2017-09-17 DIAGNOSIS — D571 Sickle-cell disease without crisis: Secondary | ICD-10-CM | POA: Diagnosis not present

## 2017-09-17 DIAGNOSIS — M059 Rheumatoid arthritis with rheumatoid factor, unspecified: Secondary | ICD-10-CM | POA: Diagnosis not present

## 2017-09-17 DIAGNOSIS — F17210 Nicotine dependence, cigarettes, uncomplicated: Secondary | ICD-10-CM | POA: Diagnosis not present

## 2017-09-17 DIAGNOSIS — L97811 Non-pressure chronic ulcer of other part of right lower leg limited to breakdown of skin: Secondary | ICD-10-CM | POA: Diagnosis not present

## 2017-09-17 DIAGNOSIS — Z7952 Long term (current) use of systemic steroids: Secondary | ICD-10-CM | POA: Diagnosis not present

## 2017-09-17 DIAGNOSIS — I87311 Chronic venous hypertension (idiopathic) with ulcer of right lower extremity: Secondary | ICD-10-CM | POA: Diagnosis not present

## 2017-09-23 ENCOUNTER — Other Ambulatory Visit: Payer: Self-pay | Admitting: Internal Medicine

## 2017-09-23 ENCOUNTER — Ambulatory Visit (HOSPITAL_COMMUNITY)
Admission: RE | Admit: 2017-09-23 | Discharge: 2017-09-23 | Disposition: A | Discharge status: Home / Self Care | Payer: Medicare Other | Source: Ambulatory Visit | Attending: Family | Admitting: Family

## 2017-09-23 DIAGNOSIS — L97909 Non-pressure chronic ulcer of unspecified part of unspecified lower leg with unspecified severity: Secondary | ICD-10-CM | POA: Diagnosis not present

## 2017-09-23 DIAGNOSIS — R609 Edema, unspecified: Secondary | ICD-10-CM

## 2017-09-23 DIAGNOSIS — I83009 Varicose veins of unspecified lower extremity with ulcer of unspecified site: Secondary | ICD-10-CM | POA: Insufficient documentation

## 2017-09-24 ENCOUNTER — Encounter: Payer: Self-pay | Admitting: Family Medicine

## 2017-09-24 ENCOUNTER — Ambulatory Visit (INDEPENDENT_AMBULATORY_CARE_PROVIDER_SITE_OTHER): Payer: Medicare Other | Admitting: Family Medicine

## 2017-09-24 ENCOUNTER — Encounter (HOSPITAL_COMMUNITY): Payer: Self-pay | Admitting: Emergency Medicine

## 2017-09-24 ENCOUNTER — Emergency Department (HOSPITAL_COMMUNITY)
Admission: EM | Admit: 2017-09-24 | Discharge: 2017-09-24 | Disposition: A | Discharge status: Home / Self Care | Payer: Medicare Other | Attending: Emergency Medicine | Admitting: Emergency Medicine

## 2017-09-24 ENCOUNTER — Encounter (HOSPITAL_BASED_OUTPATIENT_CLINIC_OR_DEPARTMENT_OTHER): Payer: Medicare Other | Attending: Internal Medicine

## 2017-09-24 VITALS — BP 116/74 | HR 60 | Temp 98.1°F | Resp 16 | Ht 71.0 in | Wt 158.0 lb

## 2017-09-24 DIAGNOSIS — Z79899 Other long term (current) drug therapy: Secondary | ICD-10-CM | POA: Diagnosis not present

## 2017-09-24 DIAGNOSIS — Y929 Unspecified place or not applicable: Secondary | ICD-10-CM | POA: Diagnosis not present

## 2017-09-24 DIAGNOSIS — W228XXA Striking against or struck by other objects, initial encounter: Secondary | ICD-10-CM | POA: Insufficient documentation

## 2017-09-24 DIAGNOSIS — Z23 Encounter for immunization: Secondary | ICD-10-CM | POA: Insufficient documentation

## 2017-09-24 DIAGNOSIS — Y9389 Activity, other specified: Secondary | ICD-10-CM | POA: Diagnosis not present

## 2017-09-24 DIAGNOSIS — D5700 Hb-SS disease with crisis, unspecified: Secondary | ICD-10-CM

## 2017-09-24 DIAGNOSIS — S0502XA Injury of conjunctiva and corneal abrasion without foreign body, left eye, initial encounter: Secondary | ICD-10-CM | POA: Insufficient documentation

## 2017-09-24 DIAGNOSIS — S81801A Unspecified open wound, right lower leg, initial encounter: Secondary | ICD-10-CM | POA: Diagnosis not present

## 2017-09-24 DIAGNOSIS — D571 Sickle-cell disease without crisis: Secondary | ICD-10-CM | POA: Insufficient documentation

## 2017-09-24 DIAGNOSIS — E039 Hypothyroidism, unspecified: Secondary | ICD-10-CM | POA: Diagnosis not present

## 2017-09-24 DIAGNOSIS — Z872 Personal history of diseases of the skin and subcutaneous tissue: Secondary | ICD-10-CM | POA: Diagnosis not present

## 2017-09-24 DIAGNOSIS — Z09 Encounter for follow-up examination after completed treatment for conditions other than malignant neoplasm: Secondary | ICD-10-CM | POA: Insufficient documentation

## 2017-09-24 DIAGNOSIS — R82998 Other abnormal findings in urine: Secondary | ICD-10-CM | POA: Diagnosis not present

## 2017-09-24 DIAGNOSIS — S0592XA Unspecified injury of left eye and orbit, initial encounter: Secondary | ICD-10-CM | POA: Diagnosis present

## 2017-09-24 DIAGNOSIS — M05731 Rheumatoid arthritis with rheumatoid factor of right wrist without organ or systems involvement: Secondary | ICD-10-CM

## 2017-09-24 DIAGNOSIS — S0591XA Unspecified injury of right eye and orbit, initial encounter: Secondary | ICD-10-CM | POA: Diagnosis not present

## 2017-09-24 DIAGNOSIS — Y999 Unspecified external cause status: Secondary | ICD-10-CM | POA: Insufficient documentation

## 2017-09-24 DIAGNOSIS — H539 Unspecified visual disturbance: Secondary | ICD-10-CM | POA: Diagnosis not present

## 2017-09-24 DIAGNOSIS — I872 Venous insufficiency (chronic) (peripheral): Secondary | ICD-10-CM | POA: Diagnosis not present

## 2017-09-24 LAB — POCT URINALYSIS DIP (DEVICE)
Bilirubin Urine: NEGATIVE
Glucose, UA: NEGATIVE mg/dL
Hgb urine dipstick: NEGATIVE
Ketones, ur: NEGATIVE mg/dL
Nitrite: NEGATIVE
Protein, ur: NEGATIVE mg/dL
Specific Gravity, Urine: 1.015 (ref 1.005–1.030)
Urobilinogen, UA: 4 mg/dL — ABNORMAL HIGH (ref 0.0–1.0)
pH: 7 (ref 5.0–8.0)

## 2017-09-24 MED ORDER — ERYTHROMYCIN 5 MG/GM OP OINT: TOPICAL_OINTMENT | OPHTHALMIC | 0 refills | Status: DC

## 2017-09-24 MED ORDER — FLUORESCEIN SODIUM 1 MG OP STRP
1.0000 | ORAL_STRIP | Freq: Once | OPHTHALMIC | Status: AC
  Administered 2017-09-24: 1 via OPHTHALMIC
  Filled 2017-09-24: qty 1

## 2017-09-24 MED ORDER — TETANUS-DIPHTH-ACELL PERTUSSIS 5-2.5-18.5 LF-MCG/0.5 IM SUSP
0.5000 mL | Freq: Once | INTRAMUSCULAR | Status: AC
Start: 2017-09-24 — End: 2017-09-24
  Administered 2017-09-24: 0.5 mL via INTRAMUSCULAR
  Filled 2017-09-24: qty 0.5

## 2017-09-24 MED ORDER — PREDNISONE 10 MG PO TABS: 10.0000 mg | ORAL_TABLET | Freq: Every day | ORAL | 0 refills | Status: DC

## 2017-09-24 MED ORDER — TETRACAINE HCL 0.5 % OP SOLN
2.0000 [drp] | Freq: Once | OPHTHALMIC | Status: AC
  Administered 2017-09-24: 2 [drp] via OPHTHALMIC
  Filled 2017-09-24: qty 4

## 2017-09-24 MED ORDER — IBUPROFEN 800 MG PO TABS
800.0000 mg | ORAL_TABLET | Freq: Once | ORAL | Status: AC
  Administered 2017-09-24: 800 mg via ORAL
  Filled 2017-09-24: qty 1

## 2017-09-24 NOTE — Discharge Instructions (Signed)
You were seen in the emergency department and found to have an abrasion of your cornea.  We are treating this with antibiotics in order to prevent infection.  Apply the erythromycin ointment 4 times per day for the next 5 days.  Follow-up with the ophthalmologist in your discharge instructions in 3-5 days for reevaluation.  Return to the emergency department for any new or worsening symptoms including but not limited to discharge from the eye that is mucousy or puslike, change in your vision, or difficulty moving her eye, or any other concerns that she may have.

## 2017-09-24 NOTE — Progress Notes (Signed)
Patient ID: [name], female    DOB: Jun 11, 1961, 57 y.o.   MRN: [id number]  PCP: Bing Neighbors, FNP  Chief Complaint  Patient presents with  . Follow-up    sickle cell    Subjective:  HPI [name] is a 57 y.o. female with rheumatoid arthritis, sickle cell anemia, chronic leg ulcerations, presents for follow-up of hemoglobin and left hand pain with swelling. [name] was seen in office 08/23/2017 after being lost to follow-up for sometime. During that visit, all of her chronic medications were resumed, she was reconnected with wound care, and advised to follow-up with her rheumatologist to schedule a follow-up visit.  Her labs from that visit revealed a low hemoglobin of 5.5, and a TSH level exceeding 100.  After multiple attempts to contact patient she finally returned to the office approximately 10 days after abnormal labs to receive 1 unit of packed red blood cells.  She again failed to follow-up for repeat lab work and presents today without an appointment complaining of left hand swelling.  She reports today that she has not resumed her chronic prednisone or picked up and resumed levothyroxine. She was just seen today in [facility] ED emergency department for evaluation and treatment of a left eye corneal abrasion. She was also seen in the ED recently for chest pain which she was negative for acute chest syndrome or ACS. Today she denies chest pain, shortness of breath, weakness, or dizziness. She will also follow-up with wound care after she leaves the office today. Social History   Socioeconomic History  . Marital status: Single    Spouse name: Not on file  . Number of children: Not on file  . Years of education: Not on file  . Highest education level: Not on file  Social Needs  . Financial resource strain: Not on file  . Food insecurity - worry: Not on file  . Food insecurity - inability: Not on file  . Transportation needs - medical: Not on file  . Transportation needs -  non-medical: Not on file  Occupational History  . Not on file  Tobacco Use  . Smoking status: Never Smoker  . Smokeless tobacco: Never Used  Substance and Sexual Activity  . Alcohol use: No  . Drug use: No  . Sexual activity: Not Currently  Other Topics Concern  . Not on file  Social History Narrative  . Not on file    Family History  Problem Relation Age of Onset  . Cancer Mother 61       Esophageal  . Sickle cell trait Mother   . Sickle cell trait Father   . HIV/AIDS Brother    Review of Systems Pertinent negatives listed in HPI Patient Active Problem List   Diagnosis Date Noted  . Symptomatic anemia 02/06/2017  . Leg wound, right 02/06/2017  . Sickle cell crisis (HCC) 10/24/2015  . Anemia of chronic disease   . Hepatitis C 05/11/2014  . 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 Thrombophlebitis leg superficial 03/04/2013  . Rheumatoid arthritis (HCC) 12/16/2012  . Hypothyroidism 11/27/2011    Allergies  Allergen Reactions  . Ampicillin     Per patient, she tolerates cephalosporins without problems  . Demerol [Meperidine] Other (See Comments)    unknow    Prior to Admission medications   Medication Sig Start Date End Date Taking? Authorizing Provider  Ascorbic Acid (VITAMIN C PO) Take 500 mg elemental calcium/kg/hr by mouth daily.   Yes [provider]  Calcium Carb-Cholecalciferol 600-800 MG-UNIT CHEW Chew 1 tablet by mouth daily. 02/14/17  Yes Bing Neighbors, FNP  folic acid (FOLVITE) 800 MCG tablet Take 0.5 tablets (400 mcg total) by mouth daily. 02/14/17  Yes Bing Neighbors, FNP  ibuprofen (ADVIL,MOTRIN) 600 MG tablet Take 1 tablet (600 mg total) by mouth every 6 (six) hours as needed. 09/09/17  Yes Couture, Cortni S, PA-C  Omega-3 Fatty Acids (FISH OIL PO) Take 1 capsule by mouth daily.   Yes [provider]  silver sulfADIAZINE (SILVADENE) 1 % cream Apply 1 application topically 2 (two) times daily. Clean site of wound and  apply cream. 08/23/17  Yes Bing Neighbors, FNP  erythromycin ophthalmic ointment Place a 1/2 inch ribbon of ointment into the lower eyelid 4 times per day for the next 5 days. Patient not taking: Reported on 09/24/2017 09/24/17   Petrucelli, Pleas Koch, PA-C  levothyroxine (SYNTHROID) 150 MCG tablet Take 1 tablet (150 mcg total) by mouth daily before breakfast. Patient not taking: Reported on 09/24/2017 08/24/17   Bing Neighbors, FNP  levothyroxine (SYNTHROID, LEVOTHROID) 25 MCG tablet Take 1 tablet (25 mcg total) by mouth daily before breakfast. Patient not taking: Reported on 09/24/2017 08/24/17   Bing Neighbors, FNP  predniSONE (DELTASONE) 5 MG tablet Take 1 tablet (5 mg total) by mouth daily with breakfast. Patient not taking: Reported on 09/24/2017 08/22/17   Barkley Boards, PA-C    Past Medical, Surgical Family and Social History reviewed and updated.    Objective:   Today's Vitals   09/24/17 1359  Hall: 158 lb (71.7 kg)  Height: 5\' 11"  (1.803 m)    Wt Readings from Last 3 Encounters:  09/24/17 158 lb (71.7 kg)  08/23/17 154 lb (69.9 kg)  02/14/17 148 lb (67.1 kg)    Physical Exam  Constitutional: She is oriented to person, place, and time. She appears cachectic. She appears ill.  Cardiovascular: Normal rate, regular rhythm, normal heart sounds and intact distal pulses.  Pulmonary/Chest: Effort normal and breath sounds normal.  Musculoskeletal: Normal range of motion.  Left hand / wrist -presence of ganglion cyst at anterior wrist with generalized swelling of left hand.  Neurological: She is alert and oriented to person, place, and time.  Psychiatric: She has a normal mood and affect. Her behavior is normal. Judgment and thought content normal.     Assessment & Plan:  1. 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 At present-pain free. Rechecking CBC today. Patient is to follow-up in 4 weeks for routine sickle cell management.  2. 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  For now she is to continue prednisone 10 mg once daily.  3. Leukocytes in urine, urine culture, pending   4. Hypothyroidism, last TSH 143.50 secondary to medication non-compliance. Patient advised to pick-up medication from pharmacy and resume tomorrow AM.  Advised patient during today's visit that if she wishes for me to continue as her PCP she will need to take an active role in her overall care and improve medication compliance.  She verbalized understanding and agreed to return for her 4-week follow-up.  Contact information was verified twice during today's visit.  RTC: 4 week sickle cell disease management and hyperthyroidism   02/16/17. Godfrey Pick, MSN, FNP-C [facility]  [address] [place], [place] [phone]

## 2017-09-24 NOTE — ED Triage Notes (Signed)
Pt c/o L eye pain after accidentally sticking comb in her eye. No drainage noted. Pt arrived with cold compress to L eye.

## 2017-09-24 NOTE — ED Provider Notes (Signed)
[facility]-EMERGENCY DEPT Provider Note   CSN: [id number] Arrival date & time: 09/24/17  1013     History   Chief Complaint Chief Complaint  Patient presents with  . Eye Injury    HPI [name] is a 57 y.o. female with a hx of sickle cell, hypothyroidism, and anxiety who presents to the ED with L eye injury that occurred at 0700 this AM. Patient states she was combing her hair and combed the eyeball by accident. States the L eye is in pain, rates it a 5/10 in severity, and states it is intermittent. Reports associated foreign body sensation, watering of the eye, and blurry vision. Denies fever, chills, nausea, or vomiting. Patient is not a contact lens wearer- she does wear glasses and does not have them with her today.   HPI  Past Medical History:  Diagnosis Date  . Aneurysm (HCC)   . Anxiety   . Arthritis   . Colon ulcer   . Hypokalemia   . Hypothyroidism   . Pneumonia   . Sickle cell anemia (HCC)   . Ulcers of both lower extremities [facility])     Patient Active Problem List   Diagnosis Date Noted  . Symptomatic anemia 02/06/2017  . Leg wound, right 02/06/2017  . Sickle cell crisis (HCC) 10/24/2015  . Anemia of chronic disease   . Hepatitis C 05/11/2014  . 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 Thrombophlebitis leg superficial 03/04/2013  . Rheumatoid arthritis (HCC) 12/16/2012  . Hypothyroidism 11/27/2011    Past Surgical History:  Procedure Laterality Date  . ANKLE SURGERY  1989 and 1990  . CEREBRAL ANEURYSM REPAIR    . COLONOSCOPY  05/12/2012   Procedure: COLONOSCOPY;  Surgeon: Hart Carwin, MD;  Location: [facility] ENDOSCOPY;  Service: Endoscopy;  Laterality: N/A;  . ESOPHAGOGASTRODUODENOSCOPY  05/12/2012   Procedure: ESOPHAGOGASTRODUODENOSCOPY (EGD);  Surgeon: Hart Carwin, MD;  Location: [facility];  Service: Endoscopy;  Laterality: N/A;  . GIVENS CAPSULE STUDY  05/13/2012   Procedure: GIVENS CAPSULE STUDY;  Surgeon: Hart Carwin, MD;  Location: [facility] ENDOSCOPY;  Service: Endoscopy;  Laterality: N/A;    OB History    No data available       Home Medications    Prior to Admission medications   Medication Sig Start Date End Date Taking? Authorizing Provider  Ascorbic Acid (VITAMIN C PO) Take 500 mg elemental calcium/kg/hr by mouth daily.   Yes [provider]  Calcium Carb-Cholecalciferol 600-800 MG-UNIT CHEW Chew 1 tablet by mouth daily. 02/14/17  Yes Bing Neighbors, FNP  folic acid (FOLVITE) 800 MCG tablet Take 0.5 tablets (400 mcg total) by mouth daily. 02/14/17  Yes Bing Neighbors, FNP  ibuprofen (ADVIL,MOTRIN) 600 MG tablet Take 1 tablet (600 mg total) by mouth every 6 (six) hours as needed. 09/09/17  Yes Couture, Cortni S, PA-C  levothyroxine (SYNTHROID) 150 MCG tablet Take 1 tablet (150 mcg total) by mouth daily before breakfast. 08/24/17  Yes Bing Neighbors, FNP  levothyroxine (SYNTHROID, LEVOTHROID) 25 MCG tablet Take 1 tablet (25 mcg total) by mouth daily before breakfast. 08/24/17  Yes Bing Neighbors, FNP  Omega-3 Fatty Acids (FISH OIL PO) Take 1 capsule by mouth daily.   Yes [provider]  predniSONE (DELTASONE) 5 MG tablet Take 1 tablet (5 mg total) by mouth daily with breakfast. 08/22/17  Yes McDonald, Mia A, PA-C  silver sulfADIAZINE (SILVADENE) 1 % cream Apply 1 application topically 2 (two) times  daily. Clean site of wound and apply cream. 08/23/17  Yes Bing Neighbors, FNP  acetaminophen (TYLENOL) 325 MG tablet Take 2 tablets (650 mg total) by mouth every 6 (six) hours as needed. Do not take more than 4000mg  of tylenol per day Patient not taking: Reported on 09/24/2017 09/09/17   Couture, Cortni S, PA-C  predniSONE (DELTASONE) 20 MG tablet Take 3 PO QAM x3days, 2 PO QAM x3days, 1 PO QAM x3days Patient not taking: Reported on 09/09/2017 08/23/17   10/21/17, FNP    Family History Family History  Problem Relation Age of Onset  . Cancer Mother 65        Esophageal  . Sickle cell trait Mother   . Sickle cell trait Father   . HIV/AIDS Brother     Social History Social History   Tobacco Use  . Smoking status: Never Smoker  . Smokeless tobacco: Never Used  Substance Use Topics  . Alcohol use: No  . Drug use: No     Allergies   Ampicillin and Demerol [meperidine]   Review of Systems Review of Systems  Constitutional: Negative for chills and fever.  Eyes: Positive for pain, discharge, itching and visual disturbance.     Physical Exam Updated Vital Signs BP 120/69   Pulse 66   Temp 97.6 F (36.4 C) (Oral)   Resp 14   SpO2 96%   Physical Exam  Constitutional: She appears well-developed and well-nourished. No distress.  HENT:  Head: Normocephalic and atraumatic.  Eyes: Conjunctivae and EOM are normal. Pupils are equal, round, and reactive to light. Right eye exhibits no discharge. No foreign body present in the right eye. Left eye exhibits no discharge. No foreign body present in the left eye. Right conjunctiva is not injected. Right conjunctiva has no hemorrhage. Left conjunctiva is not injected. Left conjunctiva has no hemorrhage.  No periorbital swelling or erythema. No proptosis.  Woods lamp exam L eye: Patient with fluorescein uptake approximately 5mm in size consistent with corneal abrasion at the 1:00 position. No evidence of corneal ulcer or dendritic staining. No evidence of hyphema or hypopyon.  IOP L eye: 19  Neurological: She is alert.  Clear speech.   Psychiatric: She has a normal mood and affect. Her behavior is normal. Thought content normal.  Nursing note and vitals reviewed.   ED Treatments / Results  Labs (all labs ordered are listed, but only abnormal results are displayed) Labs Reviewed - No data to display  EKG  EKG Interpretation None       Radiology No results found.  Procedures Procedures (including critical care time)  Medications Ordered in ED Medications  Tdap (BOOSTRIX)  injection 0.5 mL (not administered)  ibuprofen (ADVIL,MOTRIN) tablet 800 mg (not administered)  tetracaine (PONTOCAINE) 0.5 % ophthalmic solution 2 drop (2 drops Left Eye Given 09/24/17 1245)  fluorescein ophthalmic strip 1 strip (1 strip Left Eye Given 09/24/17 1245)    Initial Impression / Assessment and Plan / ED Course  I have reviewed the triage vital signs and the nursing notes.  Pertinent labs & imaging results that were available during my care of the patient were reviewed by me and considered in my medical decision making (see chart for details).    Patient presents with findings consistent with corneal abrasion on exam.  No evidence of foreign body.  No change in vision, acuity equal bilaterally. She is not a contact lens wearer.  Exam non-concerning for orbital cellulitis, hyphema, or corneal ulcer. Patient  will be discharged home with erythromycin.  Patient understands to follow up with ophthalmology, & to return to ER if new symptoms develop including change in vision, purulent drainage, or entrapment.   Final Clinical Impressions(s) / ED Diagnoses   Final diagnoses:  Abrasion of left cornea, initial encounter    ED Discharge Orders        Ordered    erythromycin ophthalmic ointment     09/24/17 1254       Cherly Anderson, PA-C 09/24/17 1330    Nira Conn, MD 09/24/17 [phone]

## 2017-09-25 ENCOUNTER — Encounter: Payer: Self-pay | Admitting: Family Medicine

## 2017-09-25 LAB — CBC WITH DIFFERENTIAL/PLATELET
Basophils Absolute: 0.2 10*3/uL (ref 0.0–0.2)
Basos: 2 %
EOS (ABSOLUTE): 0.5 10*3/uL — ABNORMAL HIGH (ref 0.0–0.4)
Eos: 6 %
Hematocrit: 19.2 % — ABNORMAL LOW (ref 34.0–46.6)
Hemoglobin: 6.5 g/dL — CL (ref 11.1–15.9)
Lymphocytes Absolute: 3.3 10*3/uL — ABNORMAL HIGH (ref 0.7–3.1)
Lymphs: 41 %
MCH: 27.8 pg (ref 26.6–33.0)
MCHC: 33.9 g/dL (ref 31.5–35.7)
MCV: 82 fL (ref 79–97)
Monocytes Absolute: 0.6 10*3/uL (ref 0.1–0.9)
Monocytes: 8 %
NRBC: 3 % — ABNORMAL HIGH (ref 0–0)
Neutrophils Absolute: 3.4 10*3/uL (ref 1.4–7.0)
Neutrophils: 42 %
Platelets: 289 10*3/uL (ref 150–379)
RBC: 2.34 x10E6/uL — CL (ref 3.77–5.28)
RDW: 23.9 % — ABNORMAL HIGH (ref 12.3–15.4)
WBC: 8 10*3/uL (ref 3.4–10.8)

## 2017-09-25 LAB — IMMATURE CELLS: Metamyelocytes: 1 % — ABNORMAL HIGH (ref 0–0)

## 2017-09-25 NOTE — Progress Notes (Signed)
Mail lab letter  

## 2017-09-26 LAB — URINE CULTURE

## 2017-10-17 NOTE — Telephone Encounter (Signed)
Note not needed 

## 2017-10-22 ENCOUNTER — Ambulatory Visit: Payer: Medicare Other | Admitting: Family Medicine

## 2017-10-23 ENCOUNTER — Ambulatory Visit: Payer: Medicare Other | Admitting: Family Medicine

## 2017-10-28 ENCOUNTER — Ambulatory Visit: Payer: Medicare Other | Admitting: Family Medicine

## 2017-10-29 ENCOUNTER — Ambulatory Visit: Payer: Medicare Other | Admitting: Family Medicine

## 2017-11-04 ENCOUNTER — Encounter: Payer: Self-pay | Admitting: Family Medicine

## 2017-11-04 ENCOUNTER — Ambulatory Visit (INDEPENDENT_AMBULATORY_CARE_PROVIDER_SITE_OTHER): Payer: Medicare Other | Admitting: Family Medicine

## 2017-11-04 ENCOUNTER — Other Ambulatory Visit: Payer: Self-pay | Admitting: Family Medicine

## 2017-11-04 VITALS — BP 112/71 | HR 65 | Temp 98.7°F | Resp 14 | Ht 71.0 in | Wt 151.0 lb

## 2017-11-04 DIAGNOSIS — E039 Hypothyroidism, unspecified: Secondary | ICD-10-CM | POA: Diagnosis not present

## 2017-11-04 DIAGNOSIS — M069 Rheumatoid arthritis, unspecified: Secondary | ICD-10-CM

## 2017-11-04 DIAGNOSIS — Z1231 Encounter for screening mammogram for malignant neoplasm of breast: Secondary | ICD-10-CM | POA: Diagnosis not present

## 2017-11-04 DIAGNOSIS — D571 Sickle-cell disease without crisis: Secondary | ICD-10-CM | POA: Diagnosis not present

## 2017-11-04 DIAGNOSIS — Z1239 Encounter for other screening for malignant neoplasm of breast: Secondary | ICD-10-CM

## 2017-11-04 NOTE — Progress Notes (Signed)
Subjective:    Patient ID: [name], female    DOB: 1961/06/07, 57 y.o.   MRN: [id number]  HPI [name], a very pleasant 57 year old female with a medical history significant for sickle cell anemia, rheumatoid arthritis, and hypothyroidism presents for a 4 week follow up of chronic conditions. [name] says that she has been taking all medications consistently. Patient endorses chronic pain related to sickle cell anemia and RA, patient is on daily prednisone and mostly takes high dose Ibuprofen for pain control. Patient is not followed by rheumatology for RA. Patient has a past CCP that was significantly elevated. 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 Patient also has soft tissue swelling about the left wrist. Patient endorses mild wrist pain on today characterized as intermittent and aching. She has not attempted prescribed analgesic to alleviate symptoms.   [name] also has a history of hypothyroidism. Previous TSH was markedly elevated during previous appointment. Levothyroxine was adjusted at the time and she has been taking consistently on an empty stomach.She currently denies fatigue, weight loss/gain, intolerance to heat/cold, depression, constipation, and/or changes to hair/skin.     Past Medical History:  Diagnosis Date  . Aneurysm (HCC)   . Anxiety   . Arthritis   . Colon ulcer   . Hypokalemia   . Hypothyroidism   . Pneumonia   . Sickle cell anemia (HCC)   . Ulcers of both lower extremities (HCC)    Social History   Socioeconomic History  . Marital status: Single    Spouse name: Not on file  . Number of children: Not on file  . Years of education: Not on file  . Highest education level: Not on file  Occupational History  . Not on file  Social Needs  . Financial resource strain: Not on file  . Food insecurity:    Worry: Not on file    Inability: Not on file  . Transportation needs:    Medical: Not on file   Non-medical: Not on file  Tobacco Use  . Smoking status: Never Smoker  . Smokeless tobacco: Never Used  Substance and Sexual Activity  . Alcohol use: No  . Drug use: No  . Sexual activity: Not Currently  Lifestyle  . Physical activity:    Days per week: Not on file    Minutes per session: Not on file  . Stress: Not on file  Relationships  . Social connections:    Talks on phone: Not on file    Gets together: Not on file    Attends religious service: Not on file    Active member of club or organization: Not on file    Attends meetings of clubs or organizations: Not on file    Relationship status: Not on file  . Intimate partner violence:    Fear of current or ex partner: Not on file    Emotionally abused: Not on file    Physically abused: Not on file    Forced sexual activity: Not on file  Other Topics Concern  . Not on file  Social History Narrative  . Not on file   Immunization History  Administered Date(s) Administered  . Hepatitis B 05/18/2015  . Influenza-Unspecified 04/24/2007  . Meningococcal Conjugate 01/09/2007  . Pneumococcal Conjugate-13 05/18/2015  . Pneumococcal Polysaccharide-23 05/11/2014  . Tdap 09/24/2017     Review of Systems  Constitutional: Negative.   HENT: Negative.   Eyes: Negative for photophobia, redness and visual  disturbance.  Respiratory: Negative.  Negative for choking and chest tightness.   Cardiovascular: Negative.   Gastrointestinal: Negative.   Endocrine: Negative for polydipsia and polyphagia.  Genitourinary: Negative.   Musculoskeletal: Positive for arthralgias.  Neurological: Negative.   Hematological: Negative.   Psychiatric/Behavioral: Negative.        Objective:   Physical Exam  Constitutional: She is oriented to person, place, and time.  Cardiovascular: Normal rate, regular rhythm, normal heart sounds and intact distal pulses.  Pulmonary/Chest: Effort normal and breath sounds normal.  Abdominal: Soft. Bowel sounds  are normal.  Musculoskeletal:       Left wrist: She exhibits decreased range of motion and swelling (trace swelling).  Neurological: She is alert and oriented to person, place, and time. She has normal reflexes.  Skin: Skin is warm and dry.  Psychiatric: She has a normal mood and affect. Her behavior is normal. Judgment and thought content normal.     BP 112/71 (BP Location: Right Arm, Patient Position: Sitting, Cuff Size: Normal)   Pulse 65   Temp 98.7 F (37.1 C) (Oral)   Resp 14   Ht 5\' 11"  (1.803 m)   Wt 151 lb (68.5 kg)   SpO2 96%   BMI 21.06 kg/m  Assessment & Plan:  1. Hb-SS disease without crisis (HCC)  Continue folic acid 1 mg daily to prevent aplastic bone marrow crises.    Pulmonary evaluation - Patient denies severe recurrent wheezes, shortness of breath with exercise, or persistent cough. If these symptoms develop, pulmonary function tests with spirometry will be ordered, and if abnormal, plan on referral to Pulmonology for further evaluation.   Cardiac - Routine screening for pulmonary hypertension is not recommended.  Eye - Will send a referral for eye exam  Immunization status - Immunizations up to date.   - CBC with Differential - Comprehensive metabolic panel - Ambulatory referral to Ophthalmology  2. Acquired hypothyroidism - Thyroid Panel With TSH  3. Rheumatoid arthritis, involving unspecified site, unspecified rheumatoid factor presence (HCC) Previous radiologic studies significant for advanced RA. Patient warrants referral to rheumatology for further evaluation. May be candidate for biologic agent.  - Ambulatory referral to Rheumatology  4. Breast cancer screening  - MM Digital Screening; Future   RTC: Patient to return in 1 month for pap smear.    MSN, FNP-C Patient Care [facility] [address] [place], [place] [phone]

## 2017-11-04 NOTE — Patient Instructions (Signed)
We will continue current medication regimen for sickle cell anemia.  Recommend that you increase water intake to 64 ounces per day.  You can also add Powerade or Gatorade to replenish electrolytes.  I will follow-up with you by phone with your TSH results and will let you know if we have to adjust medication further.  I have also sent a referral to ophthalmology to rule out sickle cell retinopathy. For rheumatoid arthritis, I have sent a referral to [facility].  They will contact you to schedule an appointment. Continue a balanced diet divided over 5-6 small meals throughout the day.

## 2017-11-05 ENCOUNTER — Telehealth: Payer: Self-pay

## 2017-11-05 LAB — COMPREHENSIVE METABOLIC PANEL
ALT: 26 IU/L (ref 0–32)
AST: 58 IU/L — ABNORMAL HIGH (ref 0–40)
Albumin/Globulin Ratio: 1.3 (ref 1.2–2.2)
Albumin: 4.1 g/dL (ref 3.5–5.5)
Alkaline Phosphatase: 76 IU/L (ref 39–117)
BUN/Creatinine Ratio: 10 (ref 9–23)
BUN: 7 mg/dL (ref 6–24)
Bilirubin Total: 2.4 mg/dL — ABNORMAL HIGH (ref 0.0–1.2)
CO2: 23 mmol/L (ref 20–29)
Calcium: 9.6 mg/dL (ref 8.7–10.2)
Chloride: 104 mmol/L (ref 96–106)
Creatinine, Ser: 0.69 mg/dL (ref 0.57–1.00)
GFR calc Af Amer: 112 mL/min/{1.73_m2} (ref >59)
GFR calc non Af Amer: 97 mL/min/{1.73_m2} (ref >59)
Globulin, Total: 3.2 g/dL (ref 1.5–4.5)
Glucose: 95 mg/dL (ref 65–99)
Potassium: 4.8 mmol/L (ref 3.5–5.2)
Sodium: 139 mmol/L (ref 134–144)
Total Protein: 7.3 g/dL (ref 6.0–8.5)

## 2017-11-05 LAB — CBC WITH DIFFERENTIAL/PLATELET
Basophils Absolute: 0 10*3/uL (ref 0.0–0.2)
Basos: 0 %
EOS (ABSOLUTE): 0.1 10*3/uL (ref 0.0–0.4)
Eos: 1 %
Hematocrit: 22.2 % — ABNORMAL LOW (ref 34.0–46.6)
Hemoglobin: 7.5 g/dL — ABNORMAL LOW (ref 11.1–15.9)
Immature Grans (Abs): 0 10*3/uL (ref 0.0–0.1)
Immature Granulocytes: 0 %
Lymphocytes Absolute: 2.2 10*3/uL (ref 0.7–3.1)
Lymphs: 21 %
MCH: 27 pg (ref 26.6–33.0)
MCHC: 33.8 g/dL (ref 31.5–35.7)
MCV: 80 fL (ref 79–97)
Monocytes Absolute: 1.2 10*3/uL — ABNORMAL HIGH (ref 0.1–0.9)
Monocytes: 12 %
Neutrophils Absolute: 6.8 10*3/uL (ref 1.4–7.0)
Neutrophils: 66 %
Platelets: 281 10*3/uL (ref 150–379)
RBC: 2.78 x10E6/uL — ABNORMAL LOW (ref 3.77–5.28)
RDW: 21.8 % — ABNORMAL HIGH (ref 12.3–15.4)
WBC: 10.3 10*3/uL (ref 3.4–10.8)

## 2017-11-05 LAB — THYROID PANEL WITH TSH
Free Thyroxine Index: 1.4 (ref 1.2–4.9)
T3 Uptake Ratio: 18 % — ABNORMAL LOW (ref 24–39)
T4, Total: 7.5 ug/dL (ref 4.5–12.0)
TSH: 9.55 u[IU]/mL — ABNORMAL HIGH (ref 0.450–4.500)

## 2017-11-05 NOTE — Telephone Encounter (Signed)
-----   Message from Massie Maroon, [place] sent at 11/05/2017 12:29 PM EDT ----- Regarding: lab results Please inform patient that hemoglobin level has increased to 7.5, which is at baseline. Continue medications as prescribed. Recommend increasing water intake to 64 ounces daily.   Nolon Nations  MSN, FNP-C Patient Care [facility] [address] [place], [place] [address] [phone]

## 2017-11-05 NOTE — Telephone Encounter (Signed)
Also, inform patient that TSH has markedly improved on current dosage of levothyroxine. Continue 175 mcg daily and will recheck TSH in 4 weeks.    Spoke with patient, advised that hemoglobin has increased to 7.5 which is at baseline and that she should continue medications as prescribed. Asked that patient drink 64 oz of water daily. Advised that TSH is improved and to continue current dosage of medication. Patient wish to scheduled lab draw when she comes in for appointment on 11/18/2017. Thanks!

## 2017-11-18 ENCOUNTER — Ambulatory Visit (INDEPENDENT_AMBULATORY_CARE_PROVIDER_SITE_OTHER): Payer: Medicare Other | Admitting: Family Medicine

## 2017-11-18 ENCOUNTER — Encounter: Payer: Self-pay | Admitting: Family Medicine

## 2017-11-18 VITALS — BP 114/70 | HR 78 | Temp 98.6°F | Resp 14 | Ht 71.0 in | Wt 151.0 lb

## 2017-11-18 DIAGNOSIS — R079 Chest pain, unspecified: Secondary | ICD-10-CM | POA: Diagnosis not present

## 2017-11-18 DIAGNOSIS — R82998 Other abnormal findings in urine: Secondary | ICD-10-CM

## 2017-11-18 DIAGNOSIS — D5700 Hb-SS disease with crisis, unspecified: Secondary | ICD-10-CM | POA: Diagnosis not present

## 2017-11-18 LAB — POCT URINALYSIS DIP (MANUAL ENTRY)
Blood, UA: NEGATIVE
Glucose, UA: NEGATIVE mg/dL
Ketones, POC UA: NEGATIVE mg/dL
Nitrite, UA: NEGATIVE
Protein Ur, POC: NEGATIVE mg/dL
Spec Grav, UA: 1.01 (ref 1.010–1.025)
Urobilinogen, UA: 4 E.U./dL — AB
pH, UA: 6.5 (ref 5.0–8.0)

## 2017-11-18 MED ORDER — OXYCODONE HCL 5 MG PO TABS: 5.0000 mg | ORAL_TABLET | ORAL | 0 refills | Status: DC | PRN

## 2017-11-18 NOTE — Patient Instructions (Signed)
Reviewed EKG, no acute findings noted.  I suspect that you have an sickle cell crisis.  Recommend oxycodone 5 mg every 6 hours as needed for moderate to severe pain.  Also, continue ibuprofen as scheduled.  Hydrate with 64 ounces of water per day to prevent vaso-occlusive sickle cell crisis.  Also, continue folic acid daily.

## 2017-11-18 NOTE — Progress Notes (Signed)
Subjective:    Patient ID: [name], female    DOB: 11-13-1960, 57 y.o.   MRN: [id number]  HPI [name], a very pleasant 57 year old female with a medical history significant for sickle cell anemia, rheumatoid arthritis, and hypothyroidism presents for a pap smear.  Upon arriving in room, patient writhing in pain. Patient states that pain intensity increased while she was waiting. She attributes sudden pain increase to cold temperature in office. She says that pain is mostly in left chest and neck characterized as constant and throbbing. Current pain intensity is 7/10.   Envi says that she has been taking all medications consistently. Patient endorses chronic pain related to sickle cell anemia and RA, patient is on daily prednisone and mostly takes high dose Ibuprofen for pain control. She denies headache, paresthesias, dysuria, nausea, vomiting, or diarrhea  Past Medical History:  Diagnosis Date  . Aneurysm (HCC)   . Anxiety   . Arthritis   . Colon ulcer   . Hypokalemia   . Hypothyroidism   . Pneumonia   . Sickle cell anemia (HCC)   . Ulcers of both lower extremities (HCC)    Social History   Socioeconomic History  . Marital status: Single    Spouse name: Not on file  . Number of children: Not on file  . Years of education: Not on file  . Highest education level: Not on file  Occupational History  . Not on file  Social Needs  . Financial resource strain: Not on file  . Food insecurity:    Worry: Not on file    Inability: Not on file  . Transportation needs:    Medical: Not on file    Non-medical: Not on file  Tobacco Use  . Smoking status: Never Smoker  . Smokeless tobacco: Never Used  Substance and Sexual Activity  . Alcohol use: No  . Drug use: No  . Sexual activity: Not Currently  Lifestyle  . Physical activity:    Days per week: Not on file    Minutes per session: Not on file  . Stress: Not on file  Relationships  . Social connections:    Talks on  phone: Not on file    Gets together: Not on file    Attends religious service: Not on file    Active member of club or organization: Not on file    Attends meetings of clubs or organizations: Not on file    Relationship status: Not on file  . Intimate partner violence:    Fear of current or ex partner: Not on file    Emotionally abused: Not on file    Physically abused: Not on file    Forced sexual activity: Not on file  Other Topics Concern  . Not on file  Social History Narrative  . Not on file   Immunization History  Administered Date(s) Administered  . Hepatitis B 05/18/2015  . Influenza-Unspecified 04/24/2007  . Meningococcal Conjugate 01/09/2007  . Pneumococcal Conjugate-13 05/18/2015  . Pneumococcal Polysaccharide-23 05/11/2014  . Tdap 09/24/2017     Review of Systems  Constitutional: Negative.   HENT: Negative.   Eyes: Negative for photophobia, redness and visual disturbance.  Respiratory: Negative.  Negative for choking and chest tightness.   Cardiovascular: Negative.   Gastrointestinal: Negative.   Endocrine: Negative for polydipsia and polyphagia.  Genitourinary: Negative.   Musculoskeletal: Positive for arthralgias.  Neurological: Negative.   Hematological: Negative.   Psychiatric/Behavioral: Negative.  Objective:   Physical Exam  Constitutional: She is oriented to person, place, and time.  Cardiovascular: Normal rate, regular rhythm, normal heart sounds and intact distal pulses.  Pulmonary/Chest: Effort normal and breath sounds normal.  Abdominal: Soft. Bowel sounds are normal.  Musculoskeletal:       Left wrist: She exhibits decreased range of motion and swelling (trace swelling).  Neurological: She is alert and oriented to person, place, and time. She has normal reflexes.  Skin: Skin is warm and dry.  Psychiatric: She has a normal mood and affect. Her behavior is normal. Judgment and thought content normal.     BP 114/70 (BP Location: Left  Arm, Patient Position: Sitting, Cuff Size: Small)   Pulse 78   Temp 98.6 F (37 C) (Oral)   Resp 14   Ht 5\' 11"  (1.803 m)   Wt 151 lb (68.5 kg)   SpO2 96%   BMI 21.06 kg/m  Assessment & Plan:  1. Sickle cell crisis (HCC) I suspect the current increase in pain intensity is related to sickle cell crisis. Patient says that pain intensity increased suddenly due to cold temperatures of office. Will start a trial of Oxycodone 5 mg every 4-6 hours for moderate to severe pain.  Continue Ibuprofen as previously prescribed.  Advised to increase water intake to 64 ounces per day to avert sickle cell crisis.  - POCT urinalysis dipstick - Comprehensive metabolic panel - CBC with Differential - EKG 12-Lead - oxyCODONE (OXY IR/ROXICODONE) 5 MG immediate release tablet; Take 1 tablet (5 mg total) by mouth every 4 (four) hours as needed for severe pain.  Dispense: 20 tablet; Refill: 0  2. Leukocytes in urine  - Urine Culture  3. Chest pain, unspecified type Reviewed EKG, no acute findings associated with chest pain.  I suspect the chest pain is related to sickle cell crisis. - EKG 12-Lead   RTC:  The patient was given clear instructions to go to ER or return to medical center if symptoms do not improve, worsen or new problems develop. The patient verbalized understanding.    Patient instructed to follow up in day infusion center for treatment of sickle cell crisis.    MSN, FNP-C Patient Care [facility] [address] [place], [place] [phone]

## 2017-11-19 ENCOUNTER — Other Ambulatory Visit: Payer: Self-pay | Admitting: Internal Medicine

## 2017-11-19 ENCOUNTER — Telehealth: Payer: Self-pay

## 2017-11-19 ENCOUNTER — Non-Acute Institutional Stay (HOSPITAL_COMMUNITY)
Admission: AD | Admit: 2017-11-19 | Discharge: 2017-11-19 | Disposition: A | Discharge status: Home / Self Care | Payer: Medicare Other | Source: Ambulatory Visit | Attending: Internal Medicine | Admitting: Internal Medicine

## 2017-11-19 ENCOUNTER — Telehealth (HOSPITAL_COMMUNITY): Payer: Self-pay | Admitting: General Practice

## 2017-11-19 DIAGNOSIS — Z79899 Other long term (current) drug therapy: Secondary | ICD-10-CM | POA: Diagnosis not present

## 2017-11-19 DIAGNOSIS — Z880 Allergy status to penicillin: Secondary | ICD-10-CM | POA: Diagnosis not present

## 2017-11-19 DIAGNOSIS — E039 Hypothyroidism, unspecified: Secondary | ICD-10-CM | POA: Diagnosis not present

## 2017-11-19 DIAGNOSIS — F419 Anxiety disorder, unspecified: Secondary | ICD-10-CM | POA: Insufficient documentation

## 2017-11-19 DIAGNOSIS — M069 Rheumatoid arthritis, unspecified: Secondary | ICD-10-CM | POA: Diagnosis not present

## 2017-11-19 DIAGNOSIS — Z885 Allergy status to narcotic agent status: Secondary | ICD-10-CM | POA: Diagnosis not present

## 2017-11-19 DIAGNOSIS — M1990 Unspecified osteoarthritis, unspecified site: Secondary | ICD-10-CM | POA: Diagnosis not present

## 2017-11-19 DIAGNOSIS — D5700 Hb-SS disease with crisis, unspecified: Secondary | ICD-10-CM | POA: Diagnosis not present

## 2017-11-19 LAB — CBC WITH DIFFERENTIAL/PLATELET
Band Neutrophils: 0 %
Basophils Absolute: 0 10*3/uL (ref 0.0–0.2)
Basophils Absolute: 0 10*3/uL (ref 0.0–0.1)
Basophils Relative: 0 %
Basos: 0 %
Blasts: 0 %
EOS (ABSOLUTE): 0.1 10*3/uL (ref 0.0–0.4)
Eos: 1 %
Eosinophils Absolute: 0 10*3/uL (ref 0.0–0.7)
Eosinophils Relative: 0 %
HCT: 19.5 % — ABNORMAL LOW (ref 36.0–46.0)
Hematocrit: 21.5 % — ABNORMAL LOW (ref 34.0–46.6)
Hemoglobin: 7.3 g/dL — ABNORMAL LOW (ref 11.1–15.9)
Hemoglobin: 6.7 g/dL — CL (ref 12.0–15.0)
Lymphocytes Absolute: 2 10*3/uL (ref 0.7–3.1)
Lymphocytes Relative: 20 %
Lymphs Abs: 2.5 10*3/uL (ref 0.7–4.0)
Lymphs: 17 %
MCH: 27 pg (ref 26.6–33.0)
MCH: 27.7 pg (ref 26.0–34.0)
MCHC: 34 g/dL (ref 31.5–35.7)
MCHC: 34.4 g/dL (ref 30.0–36.0)
MCV: 80 fL (ref 79–97)
MCV: 80.6 fL (ref 78.0–100.0)
Metamyelocytes Relative: 0 %
Monocytes Absolute: 1 10*3/uL — ABNORMAL HIGH (ref 0.1–0.9)
Monocytes Absolute: 1 10*3/uL (ref 0.1–1.0)
Monocytes Relative: 8 %
Monocytes: 8 %
Myelocytes: 0 %
NRBC: 9 % — ABNORMAL HIGH (ref 0–0)
Neutro Abs: 8.9 10*3/uL — ABNORMAL HIGH (ref 1.7–7.7)
Neutrophils Absolute: 8.7 10*3/uL — ABNORMAL HIGH (ref 1.4–7.0)
Neutrophils Relative %: 72 %
Neutrophils: 73 %
Other: 0 %
Platelets: 214 10*3/uL (ref 150–379)
Platelets: 225 10*3/uL (ref 150–400)
Promyelocytes Relative: 0 %
RBC: 2.7 x10E6/uL — CL (ref 3.77–5.28)
RBC: 2.42 MIL/uL — ABNORMAL LOW (ref 3.87–5.11)
RDW: 25 % — ABNORMAL HIGH (ref 12.3–15.4)
RDW: 27.4 % — ABNORMAL HIGH (ref 11.5–15.5)
WBC: 11.9 10*3/uL — ABNORMAL HIGH (ref 3.4–10.8)
WBC: 12.4 10*3/uL — ABNORMAL HIGH (ref 4.0–10.5)
nRBC: 14 /100 WBC — ABNORMAL HIGH

## 2017-11-19 LAB — RETICULOCYTES
RBC.: 2.42 MIL/uL — ABNORMAL LOW (ref 3.87–5.11)
Retic Count, Absolute: 534.8 10*3/uL — ABNORMAL HIGH (ref 19.0–186.0)
Retic Ct Pct: 22.1 % — ABNORMAL HIGH (ref 0.4–3.1)

## 2017-11-19 LAB — COMPREHENSIVE METABOLIC PANEL
ALT: 20 IU/L (ref 0–32)
AST: 59 IU/L — ABNORMAL HIGH (ref 0–40)
Albumin/Globulin Ratio: 1.2 (ref 1.2–2.2)
Albumin: 4.1 g/dL (ref 3.5–5.5)
Alkaline Phosphatase: 69 IU/L (ref 39–117)
BUN/Creatinine Ratio: 9 (ref 9–23)
BUN: 9 mg/dL (ref 6–24)
Bilirubin Total: 4 mg/dL — ABNORMAL HIGH (ref 0.0–1.2)
CO2: 21 mmol/L (ref 20–29)
Calcium: 9.3 mg/dL (ref 8.7–10.2)
Chloride: 100 mmol/L (ref 96–106)
Creatinine, Ser: 0.99 mg/dL (ref 0.57–1.00)
GFR calc Af Amer: 73 mL/min/{1.73_m2} (ref >59)
GFR calc non Af Amer: 63 mL/min/{1.73_m2} (ref >59)
Globulin, Total: 3.5 g/dL (ref 1.5–4.5)
Glucose: 99 mg/dL (ref 65–99)
Potassium: 4.2 mmol/L (ref 3.5–5.2)
Sodium: 137 mmol/L (ref 134–144)
Total Protein: 7.6 g/dL (ref 6.0–8.5)

## 2017-11-19 LAB — IMMATURE CELLS: Metamyelocytes: 1 % — ABNORMAL HIGH (ref 0–0)

## 2017-11-19 MED ORDER — SODIUM CHLORIDE 0.9% FLUSH: 9.0000 mL | INTRAVENOUS | Status: DC | PRN

## 2017-11-19 MED ORDER — ONDANSETRON HCL 4 MG/2ML IJ SOLN
4.0000 mg | Freq: Four times a day (QID) | INTRAMUSCULAR | Status: DC | PRN
  Administered 2017-11-19 (×2): 4 mg via INTRAVENOUS
  Filled 2017-11-19 (×2): qty 2

## 2017-11-19 MED ORDER — KETOROLAC TROMETHAMINE 30 MG/ML IJ SOLN
30.0000 mg | Freq: Four times a day (QID) | INTRAMUSCULAR | Status: DC
  Administered 2017-11-19: 30 mg via INTRAVENOUS
  Filled 2017-11-19: qty 1

## 2017-11-19 MED ORDER — DIPHENHYDRAMINE HCL 25 MG PO CAPS: 25.0000 mg | ORAL_CAPSULE | ORAL | Status: DC | PRN

## 2017-11-19 MED ORDER — SENNOSIDES-DOCUSATE SODIUM 8.6-50 MG PO TABS: 1.0000 | ORAL_TABLET | Freq: Two times a day (BID) | ORAL | Status: DC

## 2017-11-19 MED ORDER — ONDANSETRON HCL 4 MG PO TABS: 4.0000 mg | ORAL_TABLET | Freq: Three times a day (TID) | ORAL | 0 refills | Status: DC | PRN

## 2017-11-19 MED ORDER — SODIUM CHLORIDE 0.9 % IV SOLN
25.0000 mg | INTRAVENOUS | Status: DC | PRN
  Filled 2017-11-19: qty 0.5

## 2017-11-19 MED ORDER — POLYETHYLENE GLYCOL 3350 17 G PO PACK: 17.0000 g | PACK | Freq: Every day | ORAL | Status: DC | PRN

## 2017-11-19 MED ORDER — NALOXONE HCL 0.4 MG/ML IJ SOLN: 0.4000 mg | INTRAMUSCULAR | Status: DC | PRN

## 2017-11-19 MED ORDER — HYDROMORPHONE 1 MG/ML IV SOLN
INTRAVENOUS | Status: DC
  Administered 2017-11-19: 1.5 mg via INTRAVENOUS
  Administered 2017-11-19: 13:00:00 via INTRAVENOUS
  Filled 2017-11-19: qty 25

## 2017-11-19 MED ORDER — SODIUM CHLORIDE 0.45 % IV SOLN
INTRAVENOUS | Status: DC
  Administered 2017-11-19: 13:00:00 via INTRAVENOUS

## 2017-11-19 NOTE — Telephone Encounter (Signed)
Spoke with patient and she is still in pain and was transferred for triage for day hospital

## 2017-11-19 NOTE — Progress Notes (Signed)
Patient admitted to the day hospital for treatment of sickle cell pain crisis. Patient reported pain rated 8/10 in her left shoulder that radiated to her left side abdomen. Patient placed on Dilaudid PCA, given IV Toradol, IV Zofran twice 4mg  each time and hydrated with IV fluids. At discharge patient reports her pain at 3/10. Told to come back tomorrow if she feels the need. Nausea medicine to be be sent to her pharmacy as well. Discharge instructions given to patient. Patient alert, oriented and ambulatory at discharge.

## 2017-11-19 NOTE — Discharge Summary (Signed)
Physician Discharge Summary  [name] NKN:[id number] DOB: [birth year] DOA: 11/19/2017  PCP: Bing Neighbors, FNP  Admit date: 11/19/2017  Discharge date: 11/19/2017  Time spent: 30 minutes  Discharge Diagnoses:  Active Problems:   Sickle cell anemia with crisis [facility])   Discharge Condition: Stable  Diet recommendation: Regular  History of present illness:  [name] is a 57 y.o. female with history of sickle cell disease, rheumatoid arthritis, and hypothyroidism who presented to the day hospital today with complaint of pain in her left shoulder that radiated to left side, scapular and left side of abdomen. She rated the pain as 8/10. She attributes her pain change in weather yesterday and cold temperature in the office, she characterized her pain as constant and throbbing. Denied, fever, chest pain, diarrhea and nausea/vomitting. Last took Oxycontin at 04:00 am with no relief. She was seen in the clinic for her routine office visit yesterday  Hospital Course:  [name] was admitted to the day hospital with sickle cell painful crisis. Patient was treated with weight based IV Dilaudid PCA, IV Toradol, clinician assisted doses as deemed appropriate and IV fluids. She showed significant improvement symptomatically, pain improved from 8 to 3/10 at the time of discharge. Patient was discharged home in a hemodynamically stable condition. Sheralee will follow-up at the clinic as previously scheduled, continue with home medications as per prior to admission. She developed had nausea and vomited once at the time of discharge, possibly side effect of Dilaudid, resolved with IV Zofran. 20 tablets of Zofran prescribed to her pharmacy for pick up on her way home. Patient was given instruction to return in the morning or proceed to the ED at anytime if pain worsens or vomiting persists. Patient verbalize understanding.  Discharge Instructions We discussed the need for good hydration, monitoring of  hydration status, avoidance of heat, cold, stress, and infection triggers. We discussed the need to be compliant with taking Hydrea and other home medications. Denishia was reminded of the need to seek medical attention immediately if any symptom of bleeding, anemia, or infection occurs.  Discharge Exam: Vitals:   11/19/17 1356 11/19/17 1556  BP: 120/66 123/60  Pulse: 73 (!) 53  Resp:  13  Temp: 98.6 F (37 C)   SpO2: 98% 100%    General appearance: alert, cooperative and no distress Eyes: conjunctivae/corneas clear. PERRL, EOM's intact. Fundi benign. Neck: no adenopathy, no carotid bruit, no JVD, supple, symmetrical, trachea midline and thyroid not enlarged, symmetric, no tenderness/mass/nodules Back: symmetric, no curvature. ROM normal. No CVA tenderness. Resp: clear to auscultation bilaterally Chest wall: no tenderness Cardio: regular rate and rhythm, S1, S2 normal, no murmur, click, rub or gallop GI: soft, non-tender; bowel sounds normal; no masses, no organomegaly Extremities: extremities normal, atraumatic, no cyanosis or edema Pulses: 2+ and symmetric Skin: Skin color, texture, turgor normal. No rashes or lesions Neurologic: Grossly normal  Discharge Instructions    Diet - low sodium heart healthy   Complete by:  As directed    Increase activity slowly   Complete by:  As directed      Allergies as of 11/19/2017      Reactions   Ampicillin    Per patient, she tolerates cephalosporins without problems   Demerol [meperidine] Other (See Comments)   unknow   Penicillins       Medication List    TAKE these medications   Calcium Carb-Cholecalciferol 600-800 MG-UNIT Chew Chew 1 tablet by mouth daily.   erythromycin ophthalmic ointment Place  a 1/2 inch ribbon of ointment into the lower eyelid 4 times per day for the next 5 days.   FISH OIL PO Take 1 capsule by mouth daily.   folic acid 800 MCG tablet Commonly known as:  FOLVITE Take 0.5 tablets (400 mcg total) by  mouth daily.   ibuprofen 600 MG tablet Commonly known as:  ADVIL,MOTRIN Take 1 tablet (600 mg total) by mouth every 6 (six) hours as needed.   levothyroxine 150 MCG tablet Commonly known as:  SYNTHROID Take 1 tablet (150 mcg total) by mouth daily before breakfast.   levothyroxine 25 MCG tablet Commonly known as:  SYNTHROID, LEVOTHROID Take 1 tablet (25 mcg total) by mouth daily before breakfast.   oxyCODONE 5 MG immediate release tablet Commonly known as:  Oxy IR/ROXICODONE Take 1 tablet (5 mg total) by mouth every 4 (four) hours as needed for severe pain.   predniSONE 10 MG tablet Commonly known as:  DELTASONE Take 1 tablet (10 mg total) by mouth daily with breakfast.   silver sulfADIAZINE 1 % cream Commonly known as:  SILVADENE Apply 1 application topically 2 (two) times daily. Clean site of wound and apply cream.   VITAMIN C PO Take 500 mg elemental calcium/kg/hr by mouth daily.      Allergies  Allergen Reactions  . Ampicillin     Per patient, she tolerates cephalosporins without problems  . Demerol [Meperidine] Other (See Comments)    unknow  . Penicillins     Significant Diagnostic Studies: No results found.  Signed:  Jeanann Lewandowsky MD, MHA, Maxwell Caul, CPE   11/19/2017, 4:35 PM

## 2017-11-19 NOTE — Discharge Instructions (Signed)
Sickle Cell Anemia, Adult °Sickle cell anemia is a condition where your red blood cells are shaped like sickles. Red blood cells carry oxygen through the body. Sickle-shaped red blood cells do not live as long as normal red blood cells. They also clump together and block blood from flowing through the blood vessels. These things prevent the body from getting enough oxygen. Sickle cell anemia causes organ damage and pain. It also increases the risk of infection. °Follow these instructions at home: °· Drink enough fluid to keep your pee (urine) clear or pale yellow. Drink more in hot weather and during exercise. °· Do not smoke. Smoking lowers oxygen levels in the blood. °· Only take over-the-counter or prescription medicines as told by your doctor. °· Take antibiotic medicines as told by your doctor. Make sure you finish them even if you start to feel better. °· Take supplements as told by your doctor. °· Consider wearing a medical alert bracelet. This tells anyone caring for you in an emergency of your condition. °· When traveling, keep your medical information, doctors' names, and the medicines you take with you at all times. °· If you have a fever, do not take fever medicines right away. This could cover up a problem. Tell your doctor. °· Keep all follow-up visits with your doctor. Sickle cell anemia requires regular medical care. °Contact a doctor if: °You have a fever. °Get help right away if: °· You feel dizzy or faint. °· You have new belly (abdominal) pain, especially on the left side near the stomach area. °· You have a lasting, often uncomfortable and painful erection of the penis (priapism). If it is not treated right away, you will become unable to have sex (impotence). °· You have numbness in your arms or legs or you have a hard time moving them. °· You have a hard time talking. °· You have a fever or lasting symptoms for more than 2-3 days. °· You have a fever and your symptoms suddenly get  worse. °· You have signs or symptoms of infection. These include: °? Chills. °? Being more tired than normal (lethargy). °? Irritability. °? Poor eating. °? Throwing up (vomiting). °· You have pain that is not helped with medicine. °· You have shortness of breath. °· You have pain in your chest. °· You are coughing up pus-like or bloody mucus. °· You have a stiff neck. °· Your feet or hands swell or have pain. °· Your belly looks bloated. °· Your joints hurt. °This information is not intended to replace advice given to you by your health care provider. Make sure you discuss any questions you have with your health care provider. °Document Released: 04/29/2013 Document Revised: 12/15/2015 Document Reviewed: 02/18/2013 °Elsevier Interactive Patient Education © 2017 Elsevier Inc. ° °

## 2017-11-19 NOTE — H&P (Signed)
[facility] History and Physical  [name] CBS:[id number] DOB: [birth year] DOA: 11/19/2017  PCP: Bing Neighbors, FNP   Chief Complaint: Sickle cell Pain  HPI: [name] is a 57 y.o. female with history of sickle cell disease, rheumatoid arthritis, and hypothyroidism who presented to the day hospital today with complaint of pain in her left shoulder that radiated to left side, scapular and left side of abdomen. She rated the pain as 8/10. She attributes her pain change in weather yesterday and cold temperature in the office, she characterized her pain as constant and throbbing. Denied, fever, chest pain, diarrhea and nausea/vomitting. Last took Oxycontin at 04:00 am with no relief. She was seen in the clinic for her routine office visit yesterday  Systemic Review: General: The patient denies anorexia, fever, weight loss Cardiac: Denies chest pain, syncope, palpitations, pedal edema  Respiratory: Denies cough, shortness of breath, wheezing GI: Denies severe indigestion/heartburn, abdominal pain, nausea, vomiting, diarrhea and constipation GU: Denies hematuria, incontinence, dysuria  Musculoskeletal: Denies arthritis  Skin: Denies suspicious skin lesions Neurologic: Denies focal weakness or numbness, change in vision  Past Medical History:  Diagnosis Date  . Aneurysm (HCC)   . Anxiety   . Arthritis   . Colon ulcer   . Hypokalemia   . Hypothyroidism   . Pneumonia   . Sickle cell anemia (HCC)   . Ulcers of both lower extremities [facility])     Past Surgical History:  Procedure Laterality Date  . ANKLE SURGERY  1989 and 1990  . CEREBRAL ANEURYSM REPAIR    . COLONOSCOPY  05/12/2012   Procedure: COLONOSCOPY;  Surgeon: Hart Carwin, MD;  Location: [facility];  Service: Endoscopy;  Laterality: N/A;  . ESOPHAGOGASTRODUODENOSCOPY  05/12/2012   Procedure: ESOPHAGOGASTRODUODENOSCOPY (EGD);  Surgeon: Hart Carwin, MD;  Location: [facility];  Service: Endoscopy;   Laterality: N/A;  . GIVENS CAPSULE STUDY  05/13/2012   Procedure: GIVENS CAPSULE STUDY;  Surgeon: Hart Carwin, MD;  Location: [facility]OSCOPY;  Service: Endoscopy;  Laterality: N/A;    Allergies  Allergen Reactions  . Ampicillin     Per patient, she tolerates cephalosporins without problems  . Demerol [Meperidine] Other (See Comments)    unknow  . Penicillins     Family History  Problem Relation Age of Onset  . Cancer Mother 53       Esophageal  . Sickle cell trait Mother   . Sickle cell trait Father   . HIV/AIDS Brother       Prior to Admission medications   Medication Sig Start Date End Date Taking? Authorizing Provider  Ascorbic Acid (VITAMIN C PO) Take 500 mg elemental calcium/kg/hr by mouth daily.    [provider]  Calcium Carb-Cholecalciferol 600-800 MG-UNIT CHEW Chew 1 tablet by mouth daily. 02/14/17   Bing Neighbors, FNP  erythromycin ophthalmic ointment Place a 1/2 inch ribbon of ointment into the lower eyelid 4 times per day for the next 5 days. Patient not taking: Reported on 11/18/2017 09/24/17   Petrucelli, Pleas Koch, PA-C  folic acid (FOLVITE) 800 MCG tablet Take 0.5 tablets (400 mcg total) by mouth daily. 02/14/17   Bing Neighbors, FNP  ibuprofen (ADVIL,MOTRIN) 600 MG tablet Take 1 tablet (600 mg total) by mouth every 6 (six) hours as needed. 09/09/17   Couture, Cortni S, PA-C  levothyroxine (SYNTHROID) 150 MCG tablet Take 1 tablet (150 mcg total) by mouth daily before breakfast. 08/24/17   Bing Neighbors, FNP  levothyroxine (  SYNTHROID, LEVOTHROID) 25 MCG tablet Take 1 tablet (25 mcg total) by mouth daily before breakfast. 08/24/17   Bing Neighbors, FNP  Omega-3 Fatty Acids (FISH OIL PO) Take 1 capsule by mouth daily.    [provider]  oxyCODONE (OXY IR/ROXICODONE) 5 MG immediate release tablet Take 1 tablet (5 mg total) by mouth every 4 (four) hours as needed for severe pain. 11/18/17   Massie Maroon, FNP  predniSONE (DELTASONE) 10 MG  tablet Take 1 tablet (10 mg total) by mouth daily with breakfast. 09/24/17   Bing Neighbors, FNP  silver sulfADIAZINE (SILVADENE) 1 % cream Apply 1 application topically 2 (two) times daily. Clean site of wound and apply cream. Patient not taking: Reported on 11/18/2017 08/23/17   Bing Neighbors, FNP     Physical Exam: There were no vitals filed for this visit.  General: Alert, awake, afebrile, anicteric, not in obvious distress HEENT: Normocephalic and Atraumatic, Mucous membranes pink                PERRLA; EOM intact; No scleral icterus,                 Nares: Patent, Oropharynx: Clear, Fair Dentition                 Neck: FROM, no cervical lymphadenopathy, thyromegaly, carotid bruit or JVD;  CHEST WALL: No tenderness  CHEST: Normal respiration, clear to auscultation bilaterally  HEART: Regular rate and rhythm; no murmurs rubs or gallops  BACK: No kyphosis or scoliosis; no CVA tenderness  ABDOMEN: Positive Bowel Sounds, soft, non-tender; no masses, no organomegaly EXTREMITIES: No cyanosis, clubbing, or edema SKIN:  no rash or ulceration  CNS: Alert and Oriented x 4, Nonfocal exam, CN 2-12 intact  Labs on Admission:  Basic Metabolic Panel: Recent Labs  Lab 11/18/17 1608  NA 137  K 4.2  CL 100  CO2 21  GLUCOSE 99  BUN 9  CREATININE 0.99  CALCIUM 9.3   Liver Function Tests: Recent Labs  Lab 11/18/17 1608  AST 59*  ALT 20  ALKPHOS 69  BILITOT 4.0*  PROT 7.6  ALBUMIN 4.1   No results for input(s): LIPASE, AMYLASE in the last 168 hours. No results for input(s): AMMONIA in the last 168 hours. CBC: Recent Labs  Lab 11/18/17 1608  WBC 11.9*  NEUTROABS 8.7*  HGB 7.3*  HCT 21.5*  MCV 80  PLT 214   Cardiac Enzymes: No results for input(s): CKTOTAL, CKMB, CKMBINDEX, TROPONINI in the last 168 hours.  BNP (last 3 results) No results for input(s): BNP in the last 8760 hours.  ProBNP (last 3 results) No results for input(s): PROBNP in the last 8760  hours.  CBG: No results for input(s): GLUCAP in the last 168 hours.   Assessment/Plan Active Problems:   Sickle cell anemia with crisis (HCC)   Admits to the Day Hospital  IVF .45% Saline @ 150 mls/hour  Weight based Dilaudid PCA started within 30 minutes of admission  IV Toradol 30 mg Q 6 H  Monitor vitals very closely, Re-evaluate pain scale every hour  2 L of Oxygen by [place]  Patient will be re-evaluated for pain in the context of function and relationship to baseline as care progresses.  If no significant relieve from pain (remains above 5/10) will transfer patient to inpatient services for further evaluation and management  Code Status: Full  Family Communication: None  DVT Prophylaxis: Ambulate as tolerated   Time spent: 35  Minutes  Jeanann Lewandowsky, MD, MHA, FACP, FAAP, CPE  If 7PM-7AM, please contact night-coverage [email or web address] 11/19/2017, 11:53 AM

## 2017-11-19 NOTE — Progress Notes (Signed)
CRITICAL VALUE ALERT  Critical Value:  Hemoglobin 6.7  Date & Time Notied:  04/30/20019; 14:15  Provider Notified: Gerre Pebbles MD Orders Received/Actions taken:  No new orders at this time.

## 2017-11-19 NOTE — Telephone Encounter (Signed)
-----   Message from Massie Maroon, [place] sent at 11/19/2017  6:15 AM EDT ----- Regarding: lab results Please call to inquire about patient's current condition. Reviewed labs, all are consistent with baseline. If pain intensity is increased, I recommend possible day hospital admission.   Nolon Nations  MSN, FNP-C Patient Care [facility] [address] [place], [place] [address] [phone]

## 2017-11-19 NOTE — Telephone Encounter (Signed)
Patient called, complained of pain in her left shoulder that radiated to left side abdomen. She rated the pain as 8/10. Denied, fever, chest pain, diarrhea and nausea/vomitting. Last took Oxycontin at 04:00 am with no relief. Provider notified; per provider, patient can come in for treatment. Patient notified verbalized understanding.

## 2017-11-20 LAB — URINE CULTURE

## 2017-12-13 ENCOUNTER — Ambulatory Visit
Admission: RE | Admit: 2017-12-13 | Discharge: 2017-12-13 | Disposition: A | Discharge status: Home / Self Care | Payer: Medicare Other | Source: Ambulatory Visit | Attending: Family Medicine | Admitting: Family Medicine

## 2017-12-13 ENCOUNTER — Ambulatory Visit: Payer: Medicare Other

## 2017-12-13 DIAGNOSIS — Z1231 Encounter for screening mammogram for malignant neoplasm of breast: Secondary | ICD-10-CM | POA: Diagnosis not present

## 2017-12-13 DIAGNOSIS — Z1239 Encounter for other screening for malignant neoplasm of breast: Secondary | ICD-10-CM

## 2017-12-17 ENCOUNTER — Ambulatory Visit (INDEPENDENT_AMBULATORY_CARE_PROVIDER_SITE_OTHER): Payer: Medicare Other | Admitting: Family Medicine

## 2017-12-17 ENCOUNTER — Encounter: Payer: Self-pay | Admitting: Family Medicine

## 2017-12-17 ENCOUNTER — Other Ambulatory Visit: Payer: Self-pay | Admitting: Family Medicine

## 2017-12-17 VITALS — BP 122/64 | HR 76 | Temp 98.0°F | Ht 71.0 in | Wt 153.0 lb

## 2017-12-17 DIAGNOSIS — R52 Pain, unspecified: Secondary | ICD-10-CM | POA: Diagnosis not present

## 2017-12-17 DIAGNOSIS — D571 Sickle-cell disease without crisis: Secondary | ICD-10-CM

## 2017-12-17 DIAGNOSIS — Z01419 Encounter for gynecological examination (general) (routine) without abnormal findings: Secondary | ICD-10-CM

## 2017-12-17 DIAGNOSIS — Z09 Encounter for follow-up examination after completed treatment for conditions other than malignant neoplasm: Secondary | ICD-10-CM

## 2017-12-17 DIAGNOSIS — R928 Other abnormal and inconclusive findings on diagnostic imaging of breast: Secondary | ICD-10-CM

## 2017-12-17 LAB — POCT URINALYSIS DIP (MANUAL ENTRY)
Bilirubin, UA: NEGATIVE
Glucose, UA: NEGATIVE mg/dL
Ketones, POC UA: NEGATIVE mg/dL
Leukocytes, UA: NEGATIVE
Nitrite, UA: NEGATIVE
Spec Grav, UA: 1.01 (ref 1.010–1.025)
Urobilinogen, UA: 2 E.U./dL — AB
pH, UA: 7 (ref 5.0–8.0)

## 2017-12-17 NOTE — Progress Notes (Signed)
Subjective:     Patient ID: [name], female   DOB: 1960-08-25, 57 y.o.   MRN: [id number]   PCP: Carol Ip, NP  Chief Complaint  Patient presents with  . Gynecologic Exam     HPI  [name] has a history of Sickle Cell Anemia, Anxiety, Arthritis, Hyypokalemia, and Hypothyrodism  Current Status: Patient also here for routine gynecological exam.  She is not had a Pap smear in greater than 3 years.  Patient states that she has never had an abnormal Pap smear.  She denies abnormal vaginal discharge, vaginal  itching, vaginal burning, or dyspareunia. Patient states that she does not perform monthly self breast exams.  She has an upcoming mammogram.  She typically follows a balanced diet but does not exercise routinely. Body mass index is 21.34.   Gynecological Exam  She denies any known history of abnormal PAP. No family history of gynecological cancers or breast cancer. Denies dysuria, or vaginal bleeding. She complains of vaginal irritations. Denies excoriation or odor. She is overdue for her mammogram. Denies prior abnormal breast cancer screenings.   She has a good appetite. She denies abdominal pan, nausea, vomiting, diarrhea, and constipation. She denies any bleeding episodes.   He denies headaches, dizziness and falls.   She has mild pain in her knees, elbows, and feet.   Past Medical History:  Diagnosis Date  . Aneurysm (HCC)   . Anxiety   . Arthritis   . Colon ulcer   . Hypokalemia   . Hypothyroidism   . Pneumonia   . Sickle cell anemia (HCC)   . Ulcers of both lower extremities (HCC)     Family History  Problem Relation Age of Onset  . Cancer Mother 38       Esophageal  . Sickle cell trait Mother   . Sickle cell trait Father   . HIV/AIDS Brother     Social History   Socioeconomic History  . Marital status: Single    Spouse name: Not on file  . Number of children: Not on file  . Years of education: Not on file  . Highest education level: Not on file   Occupational History  . Not on file  Social Needs  . Financial resource strain: Not on file  . Food insecurity:    Worry: Not on file    Inability: Not on file  . Transportation needs:    Medical: Not on file    Non-medical: Not on file  Tobacco Use  . Smoking status: Never Smoker  . Smokeless tobacco: Never Used  Substance and Sexual Activity  . Alcohol use: No  . Drug use: No  . Sexual activity: Not Currently  Lifestyle  . Physical activity:    Days per week: Not on file    Minutes per session: Not on file  . Stress: Not on file  Relationships  . Social connections:    Talks on phone: Not on file    Gets together: Not on file    Attends religious service: Not on file    Active member of club or organization: Not on file    Attends meetings of clubs or organizations: Not on file    Relationship status: Not on file  . Intimate partner violence:    Fear of current or ex partner: Not on file    Emotionally abused: Not on file    Physically abused: Not on file    Forced sexual activity: Not on file  Other  Topics Concern  . Not on file  Social History Narrative  . Not on file    Past Surgical History:  Procedure Laterality Date  . ANKLE SURGERY  1989 and 1990  . CEREBRAL ANEURYSM REPAIR    . COLONOSCOPY  05/12/2012   Procedure: COLONOSCOPY;  Surgeon: Hart Carwin, MD;  Location: [facility];  Service: Endoscopy;  Laterality: N/A;  . ESOPHAGOGASTRODUODENOSCOPY  05/12/2012   Procedure: ESOPHAGOGASTRODUODENOSCOPY (EGD);  Surgeon: Hart Carwin, MD;  Location: [facility];  Service: Endoscopy;  Laterality: N/A;  . GIVENS CAPSULE STUDY  05/13/2012   Procedure: GIVENS CAPSULE STUDY;  Surgeon: Hart Carwin, MD;  Location: [facility];  Service: Endoscopy;  Laterality: N/A;   Immunization History  Administered Date(s) Administered  . Hepatitis B 05/18/2015  . Influenza-Unspecified 04/24/2007  . Meningococcal Conjugate 01/09/2007  . Pneumococcal Conjugate-13  05/18/2015  . Pneumococcal Polysaccharide-23 05/11/2014  . Tdap 09/24/2017    Current Meds  Medication Sig  . Ascorbic Acid (VITAMIN C PO) Take 500 mg elemental calcium/kg/hr by mouth daily.  . Calcium Carb-Cholecalciferol 600-800 MG-UNIT CHEW Chew 1 tablet by mouth daily.  . folic acid (FOLVITE) 800 MCG tablet Take 0.5 tablets (400 mcg total) by mouth daily.  Marland Kitchen ibuprofen (ADVIL,MOTRIN) 600 MG tablet Take 1 tablet (600 mg total) by mouth every 6 (six) hours as needed.  Marland Kitchen levothyroxine (SYNTHROID) 150 MCG tablet Take 1 tablet (150 mcg total) by mouth daily before breakfast.  . levothyroxine (SYNTHROID, LEVOTHROID) 25 MCG tablet Take 1 tablet (25 mcg total) by mouth daily before breakfast.  . Omega-3 Fatty Acids (FISH OIL PO) Take 1 capsule by mouth daily.  . ondansetron (ZOFRAN) 4 MG tablet Take 1 tablet (4 mg total) by mouth every 8 (eight) hours as needed for nausea or vomiting.  Marland Kitchen oxyCODONE (OXY IR/ROXICODONE) 5 MG immediate release tablet Take 1 tablet (5 mg total) by mouth every 4 (four) hours as needed for severe pain.  . predniSONE (DELTASONE) 10 MG tablet Take 1 tablet (10 mg total) by mouth daily with breakfast.  . silver sulfADIAZINE (SILVADENE) 1 % cream Apply 1 application topically 2 (two) times daily. Clean site of wound and apply cream.   Allergies  Allergen Reactions  . Ampicillin     Per patient, she tolerates cephalosporins without problems  . Demerol [Meperidine] Other (See Comments)    unknow  . Penicillins     BP 122/64 (BP Location: Left Arm, Patient Position: Sitting, Cuff Size: Small)   Pulse 76   Temp 98 F (36.7 C) (Oral)   Ht 5\' 11"  (1.803 m)   Wt 153 lb (69.4 kg)   SpO2 95%   BMI 21.34 kg/m   Review of Systems  Constitutional: Negative.   HENT: Negative.   Eyes: Negative.   Respiratory: Negative.   Cardiovascular: Negative.   Gastrointestinal: Negative.   Endocrine: Negative.   Genitourinary: Negative.   Musculoskeletal: Negative.   Skin:  Negative.   Allergic/Immunologic: Negative.   Neurological: Negative.   Hematological: Negative.   Psychiatric/Behavioral: Negative.    Objective:   Physical Exam  Constitutional: She is oriented to person, place, and time. She appears well-developed and well-nourished.  HENT:  Head: Normocephalic and atraumatic.  Right Ear: External ear normal.  Left Ear: External ear normal.  Nose: Nose normal.  Mouth/Throat: Oropharynx is clear and moist.  Eyes: Pupils are equal, round, and reactive to light. Conjunctivae and EOM are normal.  Neck: Normal range of motion. Neck supple.  Cardiovascular:  Normal rate, regular rhythm, normal heart sounds and intact distal pulses.  Pulmonary/Chest: Effort normal and breath sounds normal.  Abdominal: Soft. Bowel sounds are normal.  Musculoskeletal: Normal range of motion.  Neurological: She is alert and oriented to person, place, and time.  Skin: Skin is warm and dry. Capillary refill takes less than 2 seconds.  Psychiatric: She has a normal mood and affect. Her behavior is normal. Judgment and thought content normal.  Nursing note and vitals reviewed.  Assessment:   1. Encounter for cervical Pap smear with pelvic exam 2. Hb-SS disease without crisis (HCC) 3. Pain 4. Follow up  Plan:   1. Encounter for cervical Pap smear with pelvic exam Sample obtained from cervix  Follow-up for scheduled mammogram Recommend monthly self breast exam Recommend daily multivitamin for women Recommend strength training in 150 minutes of cardiovascular exercise per week Urinalysis is stable today. - POCT urinalysis dipstick  2. Hb-SS disease without crisis [facility]) She is doing well. She continues to stay well hydrated. Continue to avoid extreme heat, extreme cold, avoid infections, and increased stress, to decrease chances of crisis.   3. Pain Stable. Not worsening. She does not need refill on pain medication today.   4. Follow up She will follow up in 6  weeks.   No orders of the defined types were placed in this encounter.  Carol Ip,  MSN, FNP-BC Patient Care Center [facility] [address] [place], [place] [address] [phone]

## 2017-12-19 LAB — PAP IG, CT-NG, RFX HPV ASCU
Chlamydia, Nuc. Acid Amp: NEGATIVE
Gonococcus by Nucleic Acid Amp: NEGATIVE
PAP Smear Comment: 0

## 2017-12-20 DIAGNOSIS — H25013 Cortical age-related cataract, bilateral: Secondary | ICD-10-CM | POA: Diagnosis not present

## 2017-12-20 DIAGNOSIS — H35371 Puckering of macula, right eye: Secondary | ICD-10-CM | POA: Diagnosis not present

## 2017-12-20 DIAGNOSIS — H35033 Hypertensive retinopathy, bilateral: Secondary | ICD-10-CM | POA: Diagnosis not present

## 2017-12-20 DIAGNOSIS — H2513 Age-related nuclear cataract, bilateral: Secondary | ICD-10-CM | POA: Diagnosis not present

## 2017-12-24 ENCOUNTER — Ambulatory Visit
Admission: RE | Admit: 2017-12-24 | Discharge: 2017-12-24 | Disposition: A | Discharge status: Home / Self Care | Payer: Medicare Other | Source: Ambulatory Visit | Attending: Family Medicine | Admitting: Family Medicine

## 2017-12-24 DIAGNOSIS — R928 Other abnormal and inconclusive findings on diagnostic imaging of breast: Secondary | ICD-10-CM

## 2017-12-24 DIAGNOSIS — R922 Inconclusive mammogram: Secondary | ICD-10-CM | POA: Diagnosis not present

## 2017-12-24 DIAGNOSIS — N6311 Unspecified lump in the right breast, upper outer quadrant: Secondary | ICD-10-CM | POA: Diagnosis not present

## 2018-01-07 ENCOUNTER — Telehealth: Payer: Self-pay

## 2018-01-07 NOTE — Telephone Encounter (Signed)
-----   Message from Kallie Locks, FNP sent at 01/06/2018  4:19 PM EDT ----- Regarding: "Pap Results" Her Pap results are negative.

## 2018-01-07 NOTE — Telephone Encounter (Signed)
Patient notified

## 2018-01-14 ENCOUNTER — Encounter: Payer: Self-pay | Admitting: Family Medicine

## 2018-01-14 ENCOUNTER — Ambulatory Visit (INDEPENDENT_AMBULATORY_CARE_PROVIDER_SITE_OTHER): Payer: Medicare Other | Admitting: Family Medicine

## 2018-01-14 VITALS — BP 108/68 | HR 66 | Temp 98.0°F | Ht 71.0 in | Wt 153.0 lb

## 2018-01-14 DIAGNOSIS — G629 Polyneuropathy, unspecified: Secondary | ICD-10-CM | POA: Diagnosis not present

## 2018-01-14 DIAGNOSIS — R52 Pain, unspecified: Secondary | ICD-10-CM

## 2018-01-14 DIAGNOSIS — R232 Flushing: Secondary | ICD-10-CM | POA: Diagnosis not present

## 2018-01-14 DIAGNOSIS — R8290 Unspecified abnormal findings in urine: Secondary | ICD-10-CM | POA: Diagnosis not present

## 2018-01-14 DIAGNOSIS — Z09 Encounter for follow-up examination after completed treatment for conditions other than malignant neoplasm: Secondary | ICD-10-CM | POA: Diagnosis not present

## 2018-01-14 DIAGNOSIS — D571 Sickle-cell disease without crisis: Secondary | ICD-10-CM

## 2018-01-14 DIAGNOSIS — M069 Rheumatoid arthritis, unspecified: Secondary | ICD-10-CM

## 2018-01-14 LAB — POCT URINALYSIS DIP (MANUAL ENTRY)
Bilirubin, UA: NEGATIVE
Blood, UA: NEGATIVE
Glucose, UA: NEGATIVE mg/dL
Ketones, POC UA: NEGATIVE mg/dL
Nitrite, UA: NEGATIVE
Protein Ur, POC: NEGATIVE mg/dL
Spec Grav, UA: 1.015 (ref 1.010–1.025)
Urobilinogen, UA: 2 E.U./dL — AB
pH, UA: 7 (ref 5.0–8.0)

## 2018-01-14 MED ORDER — PREDNISONE 5 MG PO TABS: 5.0000 mg | ORAL_TABLET | Freq: Every day | ORAL | 0 refills | Status: DC

## 2018-01-14 NOTE — Progress Notes (Signed)
Subjective:    Patient ID: [name], female    DOB: 1960/10/08, 57 y.o.   MRN: [id number]   PCP: Raliegh Ip, NP  Chief Complaint  Patient presents with  . Follow-up    4 weeks on sickle cell    HPI  [name] has history of Sickle Cell Anemia, Hypothyroidism, Hypokalemia, Arthritis, and Anxiety.   Current Status: She reports pain pain in her left wrist, r/t Rheumatoid Arthritis. She has occasional mild pain in her right knee and back, which she takes OTC medications to help with pain.   She is doing well with no complaints. She has 'hot flashes.' She denies fevers, chills, fatigue, recent infections, and weight loss. She has recent visual changes, but has not picked up new Rx.   Reports occasional dizziness. She has not had any headaches, and falls.   No chest pain, heart palpitations, cough and shortness of breath reported.   No reports of GI problems such as nausea, vomiting, diarrhea, and constipation. She has no reports of blood in stools, dysuria and hematuria.   She has been experiencing anxiety, r/t family stressors.   Past Medical History:  Diagnosis Date  . Aneurysm (HCC)   . Anxiety   . Arthritis   . Colon ulcer   . Hypokalemia   . Hypothyroidism   . Pneumonia   . Sickle cell anemia (HCC)   . Ulcers of both lower extremities (HCC)     Family History  Problem Relation Age of Onset  . Cancer Mother 82       Esophageal  . Sickle cell trait Mother   . Sickle cell trait Father   . HIV/AIDS Brother     Social History   Socioeconomic History  . Marital status: Single    Spouse name: Not on file  . Number of children: Not on file  . Years of education: Not on file  . Highest education level: Not on file  Occupational History  . Not on file  Social Needs  . Financial resource strain: Not on file  . Food insecurity:    Worry: Not on file    Inability: Not on file  . Transportation needs:    Medical: Not on file    Non-medical: Not on file   Tobacco Use  . Smoking status: Never Smoker  . Smokeless tobacco: Never Used  Substance and Sexual Activity  . Alcohol use: No  . Drug use: No  . Sexual activity: Not Currently  Lifestyle  . Physical activity:    Days per week: Not on file    Minutes per session: Not on file  . Stress: Not on file  Relationships  . Social connections:    Talks on phone: Not on file    Gets together: Not on file    Attends religious service: Not on file    Active member of club or organization: Not on file    Attends meetings of clubs or organizations: Not on file    Relationship status: Not on file  . Intimate partner violence:    Fear of current or ex partner: Not on file    Emotionally abused: Not on file    Physically abused: Not on file    Forced sexual activity: Not on file  Other Topics Concern  . Not on file  Social History Narrative  . Not on file    Past Surgical History:  Procedure Laterality Date  . ANKLE SURGERY  1989 and 1990  .  CEREBRAL ANEURYSM REPAIR    . COLONOSCOPY  05/12/2012   Procedure: COLONOSCOPY;  Surgeon: Hart Carwin, MD;  Location: [facility];  Service: Endoscopy;  Laterality: N/A;  . ESOPHAGOGASTRODUODENOSCOPY  05/12/2012   Procedure: ESOPHAGOGASTRODUODENOSCOPY (EGD);  Surgeon: Hart Carwin, MD;  Location: [facility];  Service: Endoscopy;  Laterality: N/A;  . GIVENS CAPSULE STUDY  05/13/2012   Procedure: GIVENS CAPSULE STUDY;  Surgeon: Hart Carwin, MD;  Location: [facility];  Service: Endoscopy;  Laterality: N/A;    Immunization History  Administered Date(s) Administered  . Hepatitis B 05/18/2015  . Influenza-Unspecified 04/24/2007  . Meningococcal Conjugate 01/09/2007  . Pneumococcal Conjugate-13 05/18/2015  . Pneumococcal Polysaccharide-23 05/11/2014  . Tdap 09/24/2017    Allergies  Allergen Reactions  . Ampicillin     Per patient, she tolerates cephalosporins without problems  . Demerol [Meperidine] Other (See Comments)    unknow  .  Penicillins     BP 108/68 (BP Location: Left Arm, Patient Position: Sitting, Cuff Size: Small)   Pulse 66   Temp 98 F (36.7 C) (Oral)   Ht 5\' 11"  (1.803 m)   Wt 153 lb (69.4 kg)   SpO2 96%   BMI 21.34 kg/m   Review of Systems  Constitutional: Negative.   HENT: Negative.   Eyes: Negative.   Respiratory: Negative.   Cardiovascular: Negative.   Gastrointestinal: Negative.   Endocrine: Negative.   Genitourinary: Negative.   Musculoskeletal: Negative.   Skin: Negative.   Allergic/Immunologic: Negative.   Neurological: Negative.   Hematological: Negative.   Psychiatric/Behavioral: Negative.    Objective:   Physical Exam  Constitutional: She is oriented to person, place, and time. She appears well-developed and well-nourished.  HENT:  Head: Normocephalic and atraumatic.  Right Ear: External ear normal.  Left Ear: External ear normal.  Nose: Nose normal.  Mouth/Throat: Oropharynx is clear and moist.  Eyes: Pupils are equal, round, and reactive to light. Conjunctivae and EOM are normal.  Neck: Normal range of motion. Neck supple.  Cardiovascular: Normal rate, regular rhythm, normal heart sounds and intact distal pulses.  Pulmonary/Chest: Effort normal and breath sounds normal.  Abdominal: Soft. Bowel sounds are normal.  Musculoskeletal: Normal range of motion.  Neurological: She is alert and oriented to person, place, and time.  Skin: Skin is warm and dry. Capillary refill takes less than 2 seconds.  Psychiatric: She has a normal mood and affect. Her behavior is normal. Judgment and thought content normal.  Nursing note and vitals reviewed.  Assessment & Plan:   1. Hb-SS disease without crisis [facility]) She is doing well. She continues to take Folic Acid daily as prescribed. She has mild pain in left knee and mild chronic back pain, which she takes OTC pain medications. She does have Rx for Oxycodone as needed.   She will continue to drink at least 64 oz of fluids daily,  avoid excessive heat and cold, anyone with infections, and minimize her stress, to prevent triggers of a crisis event.   - POCT urinalysis dipstick  2. Neuropathy Numbness/tingling located in right 2 end fingers. Stable. She will take Neurontin as prescribed.   3. Hot flashes R/t menopause. Stable today. Not worsening. Advised to use Neurontin for relief.   4. Pain She has mild right knee pain, and chronic back pain. She will continue OTC medications as needed.   5. Rheumatoid arthritis, involving unspecified site, unspecified rheumatoid factor presence (HCC) She continues to have increased pain in her left wrist. She will  follow up with Rheumatologist when she is able to get reliable transportation. We send Rx for Prednisone to pharmacy today.  - predniSONE (DELTASONE) 5 MG tablet; Take 1 tablet (5 mg total) by mouth daily with breakfast.  Dispense: 30 tablet; Refill: 0  6. Abnormal urinalysis Urinalysis revealed + Leukocytes. We will get Urine Culture to further evaluate.  - Urine Culture  7. Follow up She will follow up in 6 weeks.   Meds ordered this encounter  Medications  . predniSONE (DELTASONE) 5 MG tablet    Sig: Take 1 tablet (5 mg total) by mouth daily with breakfast.    Dispense:  30 tablet    Refill:  0    Raliegh Ip,  MSN, FNP-BC [facility] [address] [place], [place] [address] [phone]

## 2018-01-14 NOTE — Patient Instructions (Signed)
Prednisone tablets °What is this medicine? °PREDNISONE (PRED ni sone) is a corticosteroid. It is commonly used to treat inflammation of the skin, joints, lungs, and other organs. Common conditions treated include asthma, allergies, and arthritis. It is also used for other conditions, such as blood disorders and diseases of the adrenal glands. °This medicine may be used for other purposes; ask your health care provider or pharmacist if you have questions. °COMMON BRAND NAME(S): Deltasone, Predone, Sterapred, Sterapred DS °What should I tell my health care provider before I take this medicine? °They need to know if you have any of these conditions: °-Cushing's syndrome °-diabetes °-glaucoma °-heart disease °-high blood pressure °-infection (especially a virus infection such as chickenpox, cold sores, or herpes) °-kidney disease °-liver disease °-mental illness °-myasthenia gravis °-osteoporosis °-seizures °-stomach or intestine problems °-thyroid disease °-an unusual or allergic reaction to lactose, prednisone, other medicines, foods, dyes, or preservatives °-pregnant or trying to get pregnant °-breast-feeding °How should I use this medicine? °Take this medicine by mouth with a glass of water. Follow the directions on the prescription label. Take this medicine with food. If you are taking this medicine once a day, take it in the morning. Do not take more medicine than you are told to take. Do not suddenly stop taking your medicine because you may develop a severe reaction. Your doctor will tell you how much medicine to take. If your doctor wants you to stop the medicine, the dose may be slowly lowered over time to avoid any side effects. °Talk to your pediatrician regarding the use of this medicine in children. Special care may be needed. °Overdosage: If you think you have taken too much of this medicine contact a poison control center or emergency room at once. °NOTE: This medicine is only for you. Do not share this  medicine with others. °What if I miss a dose? °If you miss a dose, take it as soon as you can. If it is almost time for your next dose, talk to your doctor or health care professional. You may need to miss a dose or take an extra dose. Do not take double or extra doses without advice. °What may interact with this medicine? °Do not take this medicine with any of the following medications: °-metyrapone °-mifepristone °This medicine may also interact with the following medications: °-aminoglutethimide °-amphotericin B °-aspirin and aspirin-like medicines °-barbiturates °-certain medicines for diabetes, like glipizide or glyburide °-cholestyramine °-cholinesterase inhibitors °-cyclosporine °-digoxin °-diuretics °-ephedrine °-female hormones, like estrogens and birth control pills °-isoniazid °-ketoconazole °-NSAIDS, medicines for pain and inflammation, like ibuprofen or naproxen °-phenytoin °-rifampin °-toxoids °-vaccines °-warfarin °This list may not describe all possible interactions. Give your health care provider a list of all the medicines, herbs, non-prescription drugs, or dietary supplements you use. Also tell them if you smoke, drink alcohol, or use illegal drugs. Some items may interact with your medicine. °What should I watch for while using this medicine? °Visit your doctor or health care professional for regular checks on your progress. If you are taking this medicine over a prolonged period, carry an identification card with your name and address, the type and dose of your medicine, and your doctor's name and address. °This medicine may increase your risk of getting an infection. Tell your doctor or health care professional if you are around anyone with measles or chickenpox, or if you develop sores or blisters that do not heal properly. °If you are going to have surgery, tell your doctor or health care professional that   you have taken this medicine within the last twelve months. °Ask your doctor or health  care professional about your diet. You may need to lower the amount of salt you eat. °This medicine may affect blood sugar levels. If you have diabetes, check with your doctor or health care professional before you change your diet or the dose of your diabetic medicine. °What side effects may I notice from receiving this medicine? °Side effects that you should report to your doctor or health care professional as soon as possible: °-allergic reactions like skin rash, itching or hives, swelling of the face, lips, or tongue °-changes in emotions or moods °-changes in vision °-depressed mood °-eye pain °-fever or chills, cough, sore throat, pain or difficulty passing urine °-increased thirst °-swelling of ankles, feet °Side effects that usually do not require medical attention (report to your doctor or health care professional if they continue or are bothersome): °-confusion, excitement, restlessness °-headache °-nausea, vomiting °-skin problems, acne, thin and shiny skin °-trouble sleeping °-weight gain °This list may not describe all possible side effects. Call your doctor for medical advice about side effects. You may report side effects to FDA at 1-800-FDA-1088. °Where should I keep my medicine? °Keep out of the reach of children. °Store at room temperature between 15 and 30 degrees C (59 and 86 degrees F). Protect from light. Keep container tightly closed. Throw away any unused medicine after the expiration date. °NOTE: This sheet is a summary. It may not cover all possible information. If you have questions about this medicine, talk to your doctor, pharmacist, or health care provider. °© 2018 [employer] Standard (2011-02-22 10:57:14) ° °

## 2018-01-17 LAB — URINE CULTURE

## 2018-01-19 ENCOUNTER — Other Ambulatory Visit: Payer: Self-pay | Admitting: Family Medicine

## 2018-01-19 DIAGNOSIS — R319 Hematuria, unspecified: Secondary | ICD-10-CM

## 2018-01-19 DIAGNOSIS — N390 Urinary tract infection, site not specified: Secondary | ICD-10-CM

## 2018-01-20 ENCOUNTER — Telehealth: Payer: Self-pay

## 2018-01-20 DIAGNOSIS — M069 Rheumatoid arthritis, unspecified: Secondary | ICD-10-CM

## 2018-01-20 MED ORDER — PREDNISONE 5 MG PO TABS: 5.0000 mg | ORAL_TABLET | Freq: Every day | ORAL | 0 refills | Status: DC

## 2018-01-20 NOTE — Telephone Encounter (Signed)
Left a vm for patient to callback 

## 2018-01-20 NOTE — Telephone Encounter (Signed)
Medication resent to the pharmacy because they didn't receive it

## 2018-01-20 NOTE — Telephone Encounter (Signed)
-----   Message from Kallie Locks, FNP sent at 01/19/2018 10:42 PM EDT ----- Regarding: "Lab Results" [name],   Please inform patient that she has a UTI and we have sent a new Rx for Bactrim to pharmacy. Remind her to take all medication as directed.   Thank you!

## 2018-01-20 NOTE — Telephone Encounter (Signed)
Patient notified of results.

## 2018-01-20 NOTE — Telephone Encounter (Signed)
-----   Message from Natalie M Stroud, FNP sent at 01/19/2018 10:42 PM EDT ----- Regarding: "Lab Results" Carrie,   Please inform patient that she has a UTI and we have sent a new Rx for Bactrim to pharmacy. Remind her to take all medication as directed.   Thank you! 

## 2018-01-21 MED ORDER — SULFAMETHOXAZOLE-TRIMETHOPRIM 800-160 MG PO TABS: 1.0000 | ORAL_TABLET | Freq: Two times a day (BID) | ORAL | 0 refills | Status: DC

## 2018-01-22 ENCOUNTER — Other Ambulatory Visit: Payer: Self-pay

## 2018-01-22 DIAGNOSIS — M069 Rheumatoid arthritis, unspecified: Secondary | ICD-10-CM

## 2018-01-22 MED ORDER — PREDNISONE 5 MG PO TABS: 5.0000 mg | ORAL_TABLET | Freq: Every day | ORAL | 0 refills | Status: DC

## 2018-01-22 NOTE — Telephone Encounter (Signed)
Medication sent to pharmacy  

## 2018-03-05 ENCOUNTER — Ambulatory Visit: Payer: Medicare Other | Admitting: Family Medicine

## 2018-04-01 ENCOUNTER — Ambulatory Visit: Payer: Medicare Other | Admitting: Family Medicine

## 2018-07-29 ENCOUNTER — Ambulatory Visit: Payer: Medicare Other | Admitting: Family Medicine

## 2018-08-06 ENCOUNTER — Encounter: Payer: Self-pay | Admitting: Family Medicine

## 2018-08-06 ENCOUNTER — Ambulatory Visit (INDEPENDENT_AMBULATORY_CARE_PROVIDER_SITE_OTHER): Payer: Medicare HMO | Admitting: Family Medicine

## 2018-08-06 VITALS — BP 132/78 | HR 80 | Temp 98.1°F | Ht 71.0 in | Wt 157.0 lb

## 2018-08-06 DIAGNOSIS — M069 Rheumatoid arthritis, unspecified: Secondary | ICD-10-CM

## 2018-08-06 DIAGNOSIS — Z09 Encounter for follow-up examination after completed treatment for conditions other than malignant neoplasm: Secondary | ICD-10-CM

## 2018-08-06 DIAGNOSIS — IMO0002 Reserved for concepts with insufficient information to code with codable children: Secondary | ICD-10-CM

## 2018-08-06 DIAGNOSIS — R29898 Other symptoms and signs involving the musculoskeletal system: Secondary | ICD-10-CM | POA: Diagnosis not present

## 2018-08-06 DIAGNOSIS — R8290 Unspecified abnormal findings in urine: Secondary | ICD-10-CM

## 2018-08-06 DIAGNOSIS — R232 Flushing: Secondary | ICD-10-CM

## 2018-08-06 LAB — POCT URINALYSIS DIP (MANUAL ENTRY)
Glucose, UA: NEGATIVE mg/dL
Ketones, POC UA: NEGATIVE mg/dL
Nitrite, UA: NEGATIVE
Protein Ur, POC: 100 mg/dL — AB
Spec Grav, UA: 1.01 (ref 1.010–1.025)
Urobilinogen, UA: 4 E.U./dL — AB
pH, UA: 6.5 (ref 5.0–8.0)

## 2018-08-06 MED ORDER — FISH OIL 1000 MG PO CAPS: 1000.0000 mg | ORAL_CAPSULE | Freq: Every day | ORAL | 6 refills | Status: DC

## 2018-08-06 MED ORDER — FOLIC ACID 800 MCG PO TABS: 400.0000 ug | ORAL_TABLET | Freq: Every day | ORAL | 4 refills | Status: DC

## 2018-08-06 MED ORDER — LEVOTHYROXINE SODIUM 25 MCG PO TABS: 25.0000 ug | ORAL_TABLET | Freq: Every day | ORAL | 2 refills | Status: DC

## 2018-08-06 MED ORDER — GABAPENTIN 100 MG PO CAPS: 100.0000 mg | ORAL_CAPSULE | Freq: Every day | ORAL | 6 refills | Status: DC

## 2018-08-06 MED ORDER — LEVOTHYROXINE SODIUM 150 MCG PO TABS: 150.0000 ug | ORAL_TABLET | Freq: Every day | ORAL | 2 refills | Status: DC

## 2018-08-06 MED ORDER — PREDNISONE 5 MG PO TABS: 5.0000 mg | ORAL_TABLET | Freq: Every day | ORAL | 0 refills | Status: DC

## 2018-08-06 MED ORDER — IBUPROFEN 600 MG PO TABS: 600.0000 mg | ORAL_TABLET | Freq: Four times a day (QID) | ORAL | 6 refills | Status: DC | PRN

## 2018-08-06 NOTE — Progress Notes (Signed)
Follow Up  Subjective:    Patient ID: [name], female    DOB: 08/27/1960, 58 y.o.   MRN: [id number]  Chief Complaint  Patient presents with  . Follow-up    sickle cell  . toe discoloration   HPI  [name] is a 58 year old female with a past medical history of Sickle Cell Anemia, Ulcers of Lower Extremities, Pneumonia, Hypothyroidism, Hypokalemia, Colon Ulcer, Arthritis, Anxiety, and Aneurysm. She is here today for follow up.    Current Status: Since she last office visit, she is doing well with no complaints she states that she has pain in her right knee, back, and left wrist. She rates her pain today at 9/10. She has not has a [facility] admission for Sickle Cell Crisis since 11/19/2017 where she was treated and discharged the same day. She is currently taking all medications as prescribed and staying well hydrated. She reports occasional dizziness and headaches. She continues to have chronic pain in her left wrist, r/t Rheumatoid Arthritis, which she continues to take OTC pain medications for pain relief. Insurance is not longer paying for infusion treatments for RA. [facility]. Patient takes Rx for Oxy-IR only if necessary.   She denies fevers, chills, fatigue, recent infections, weight loss, and night sweats. She has not had any headaches, visual changes, dizziness, and falls. No chest pain, heart palpitations, cough and shortness of breath reported. No reports of GI problems such as nausea, vomiting, diarrhea, and constipation. She has no reports of blood in stools, dysuria and hematuria. No depression or anxiety reported. She has mild pain today.   Review of Systems  Constitutional: Negative.   HENT: Negative.   Eyes: Negative.   Respiratory: Negative.   Cardiovascular: Negative.   Gastrointestinal: Negative.   Genitourinary: Negative.   Musculoskeletal: Negative.   Skin: Positive for color change.       Bilateral lower leg  ulcers.   Allergic/Immunologic: Negative.   Neurological: Positive for dizziness and headaches.  Hematological: Negative.   Psychiatric/Behavioral: Negative.    Objective:   Physical Exam Vitals signs and nursing note reviewed.  Constitutional:      Appearance: Normal appearance. She is normal weight.  HENT:     Head: Normocephalic and atraumatic.     Right Ear: External ear normal.     Left Ear: External ear normal.     Nose: Nose normal.     Mouth/Throat:     Mouth: Mucous membranes are moist.     Pharynx: Oropharynx is clear.  Eyes:     Conjunctiva/sclera: Conjunctivae normal.  Neck:     Musculoskeletal: Normal range of motion and neck supple.  Cardiovascular:     Rate and Rhythm: Normal rate and regular rhythm.     Pulses: Normal pulses.  Abdominal:     General: Abdomen is flat. Bowel sounds are normal.     Palpations: Abdomen is soft.  Musculoskeletal: Normal range of motion.  Skin:    General: Skin is warm and dry.     Capillary Refill: Capillary refill takes less than 2 seconds.  Neurological:     General: No focal deficit present.     Mental Status: She is alert and oriented to person, place, and time.  Psychiatric:        Mood and Affect: Mood normal.        Behavior: Behavior normal.        Thought Content: Thought content normal.  Judgment: Judgment normal.    Assessment & Plan:   1. Rheumatoid arthritis, involving unspecified site, unspecified rheumatoid factor presence (HCC) We will initiate Prednisone today. She will continue to follow up with Rheumatologist as needed.  - predniSONE (DELTASONE) 5 MG tablet; Take 1 tablet (5 mg total) by mouth daily with breakfast.  Dispense: 30 tablet; Refill: 0  2. Hot flashes Stable.   3. Foot symptom - Ambulatory referral to Podiatry  4. Abnormal urinalysis Results are pending.  - Urine Culture  5. Follow up She will follow up in 3 months. - POCT urinalysis dipstick  Meds ordered this encounter   Medications  . folic acid (FOLVITE) 800 MCG tablet    Sig: Take 0.5 tablets (400 mcg total) by mouth daily.    Dispense:  60 tablet    Refill:  4  . gabapentin (NEURONTIN) 100 MG capsule    Sig: Take 1 capsule (100 mg total) by mouth at bedtime.    Dispense:  30 capsule    Refill:  6  . ibuprofen (ADVIL,MOTRIN) 600 MG tablet    Sig: Take 1 tablet (600 mg total) by mouth every 6 (six) hours as needed.    Dispense:  30 tablet    Refill:  6  . levothyroxine (SYNTHROID) 150 MCG tablet    Sig: Take 1 tablet (150 mcg total) by mouth daily before breakfast.    Dispense:  90 tablet    Refill:  2  . levothyroxine (SYNTHROID, LEVOTHROID) 25 MCG tablet    Sig: Take 1 tablet (25 mcg total) by mouth daily before breakfast.    Dispense:  90 tablet    Refill:  2  . Omega-3 Fatty Acids (FISH OIL) 1000 MG CAPS    Sig: Take 1 capsule (1,000 mg total) by mouth daily.    Dispense:  30 capsule    Refill:  6  . predniSONE (DELTASONE) 5 MG tablet    Sig: Take 1 tablet (5 mg total) by mouth daily with breakfast.    Dispense:  30 tablet    Refill:  0     Referral Orders     Ambulatory referral to Podiatry   Orders Placed This Encounter  Procedures  . Urine Culture  . Ambulatory referral to Podiatry  . POCT urinalysis dipstick   Raliegh Ip,  MSN, FNP-C [facility] [address] [place], [place] [address] [phone]

## 2018-08-06 NOTE — Patient Instructions (Signed)
Prednisone tablets  What is this medicine?  PREDNISONE (PRED ni sone) is a corticosteroid. It is commonly used to treat inflammation of the skin, joints, lungs, and other organs. Common conditions treated include asthma, allergies, and arthritis. It is also used for other conditions, such as blood disorders and diseases of the adrenal glands.  This medicine may be used for other purposes; ask your health care provider or pharmacist if you have questions.  COMMON BRAND NAME(S): Deltasone, Predone, Sterapred, Sterapred DS  What should I tell my health care provider before I take this medicine?  They need to know if you have any of these conditions:  -Cushing's syndrome  -diabetes  -glaucoma  -heart disease  -high blood pressure  -infection (especially a virus infection such as chickenpox, cold sores, or herpes)  -kidney disease  -liver disease  -mental illness  -myasthenia gravis  -osteoporosis  -seizures  -stomach or intestine problems  -thyroid disease  -an unusual or allergic reaction to lactose, prednisone, other medicines, foods, dyes, or preservatives  -pregnant or trying to get pregnant  -breast-feeding  How should I use this medicine?  Take this medicine by mouth with a glass of water. Follow the directions on the prescription label. Take this medicine with food. If you are taking this medicine once a day, take it in the morning. Do not take more medicine than you are told to take. Do not suddenly stop taking your medicine because you may develop a severe reaction. Your doctor will tell you how much medicine to take. If your doctor wants you to stop the medicine, the dose may be slowly lowered over time to avoid any side effects.  Talk to your pediatrician regarding the use of this medicine in children. Special care may be needed.  Overdosage: If you think you have taken too much of this medicine contact a poison control center or emergency room at once.  NOTE: This medicine is only for you. Do not share this  medicine with others.  What if I miss a dose?  If you miss a dose, take it as soon as you can. If it is almost time for your next dose, talk to your doctor or health care professional. You may need to miss a dose or take an extra dose. Do not take double or extra doses without advice.  What may interact with this medicine?  Do not take this medicine with any of the following medications:  -metyrapone  -mifepristone  This medicine may also interact with the following medications:  -aminoglutethimide  -amphotericin B  -aspirin and aspirin-like medicines  -barbiturates  -certain medicines for diabetes, like glipizide or glyburide  -cholestyramine  -cholinesterase inhibitors  -cyclosporine  -digoxin  -diuretics  -ephedrine  -female hormones, like estrogens and birth control pills  -isoniazid  -ketoconazole  -NSAIDS, medicines for pain and inflammation, like ibuprofen or naproxen  -phenytoin  -rifampin  -toxoids  -vaccines  -warfarin  This list may not describe all possible interactions. Give your health care provider a list of all the medicines, herbs, non-prescription drugs, or dietary supplements you use. Also tell them if you smoke, drink alcohol, or use illegal drugs. Some items may interact with your medicine.  What should I watch for while using this medicine?  Visit your doctor or health care professional for regular checks on your progress. If you are taking this medicine over a prolonged period, carry an identification card with your name and address, the type and dose of your medicine, and   your doctor's name and address.  This medicine may increase your risk of getting an infection. Tell your doctor or health care professional if you are around anyone with measles or chickenpox, or if you develop sores or blisters that do not heal properly.  If you are going to have surgery, tell your doctor or health care professional that you have taken this medicine within the last twelve months.  Ask your doctor or health  care professional about your diet. You may need to lower the amount of salt you eat.  This medicine may increase blood sugar. Ask your healthcare provider if changes in diet or medicines are needed if you have diabetes.  What side effects may I notice from receiving this medicine?  Side effects that you should report to your doctor or health care professional as soon as possible:  -allergic reactions like skin rash, itching or hives, swelling of the face, lips, or tongue  -changes in emotions or moods  -changes in vision  -depressed mood  -eye pain  -fever or chills, cough, sore throat, pain or difficulty passing urine  -signs and symptoms of high blood sugar such as being more thirsty or hungry or having to urinate more than normal. You may also feel very tired or have blurry vision.  -swelling of ankles, feet  Side effects that usually do not require medical attention (report to your doctor or health care professional if they continue or are bothersome):  -confusion, excitement, restlessness  -headache  -nausea, vomiting  -skin problems, acne, thin and shiny skin  -trouble sleeping  -weight gain  This list may not describe all possible side effects. Call your doctor for medical advice about side effects. You may report side effects to FDA at 1-800-FDA-1088.  Where should I keep my medicine?  Keep out of the reach of children.  Store at room temperature between 15 and 30 degrees C (59 and 86 degrees F). Protect from light. Keep container tightly closed. Throw away any unused medicine after the expiration date.  NOTE: This sheet is a summary. It may not cover all possible information. If you have questions about this medicine, talk to your doctor, pharmacist, or health care provider.   2019 Elsevier/Gold Standard (2018-04-08 10:54:22)

## 2018-08-08 LAB — URINE CULTURE: Organism ID, Bacteria: NO GROWTH

## 2018-11-05 ENCOUNTER — Ambulatory Visit: Payer: Medicare HMO | Admitting: Family Medicine

## 2018-11-07 NOTE — Telephone Encounter (Signed)
error 

## 2018-11-17 ENCOUNTER — Telehealth: Payer: Self-pay

## 2018-11-17 ENCOUNTER — Emergency Department (HOSPITAL_COMMUNITY)
Admission: EM | Admit: 2018-11-17 | Discharge: 2018-11-17 | Disposition: A | Discharge status: Home / Self Care | Payer: Medicare Other | Attending: Emergency Medicine | Admitting: Emergency Medicine

## 2018-11-17 ENCOUNTER — Encounter (HOSPITAL_COMMUNITY): Payer: Self-pay | Admitting: Emergency Medicine

## 2018-11-17 ENCOUNTER — Other Ambulatory Visit: Payer: Self-pay

## 2018-11-17 ENCOUNTER — Emergency Department (HOSPITAL_COMMUNITY): Payer: Medicare Other

## 2018-11-17 DIAGNOSIS — Z79899 Other long term (current) drug therapy: Secondary | ICD-10-CM | POA: Insufficient documentation

## 2018-11-17 DIAGNOSIS — Y999 Unspecified external cause status: Secondary | ICD-10-CM | POA: Insufficient documentation

## 2018-11-17 DIAGNOSIS — S42202A Unspecified fracture of upper end of left humerus, initial encounter for closed fracture: Secondary | ICD-10-CM | POA: Diagnosis not present

## 2018-11-17 DIAGNOSIS — M25512 Pain in left shoulder: Secondary | ICD-10-CM | POA: Diagnosis not present

## 2018-11-17 DIAGNOSIS — Y9289 Other specified places as the place of occurrence of the external cause: Secondary | ICD-10-CM | POA: Diagnosis not present

## 2018-11-17 DIAGNOSIS — E039 Hypothyroidism, unspecified: Secondary | ICD-10-CM | POA: Diagnosis not present

## 2018-11-17 DIAGNOSIS — Y9301 Activity, walking, marching and hiking: Secondary | ICD-10-CM | POA: Diagnosis not present

## 2018-11-17 DIAGNOSIS — W010XXA Fall on same level from slipping, tripping and stumbling without subsequent striking against object, initial encounter: Secondary | ICD-10-CM | POA: Insufficient documentation

## 2018-11-17 DIAGNOSIS — S4992XA Unspecified injury of left shoulder and upper arm, initial encounter: Secondary | ICD-10-CM | POA: Diagnosis not present

## 2018-11-17 MED ORDER — HYDROCODONE-ACETAMINOPHEN 5-325 MG PO TABS: 1.0000 | ORAL_TABLET | ORAL | 0 refills | Status: DC | PRN

## 2018-11-17 MED ORDER — IBUPROFEN 200 MG PO TABS
400.0000 mg | ORAL_TABLET | Freq: Once | ORAL | Status: AC
  Administered 2018-11-17: 400 mg via ORAL
  Filled 2018-11-17: qty 2

## 2018-11-17 NOTE — ED Provider Notes (Signed)
[facility]-EMERGENCY DEPT Provider Note   CSN: [id number] Arrival date & time: 11/17/18  1304    History   Chief Complaint Chief Complaint  Patient presents with  . Shoulder Pain  . Fall    HPI Carol JarvisDeveda Margo Hall is a 58 y.o. female.     HPI Patient presented to the emergency room for evaluation of left shoulder pain.  Patient states she fell on Saturday when she was walking up a hill.  She landed on her left shoulder.  Since that time she has had pain in the left shoulder.  It increases to try to move her arm.  She denies any headache or loss of consciousness.  No pain in her elbow or wrist.  Patient denies any other complaints. Past Medical History:  Diagnosis Date  . Aneurysm (HCC)   . Anxiety   . Arthritis   . Colon ulcer   . Hypokalemia   . Hypothyroidism   . Pneumonia   . Sickle cell anemia (HCC)   . Ulcers of both lower extremities [facility](HCC)     Patient Active Problem List   Diagnosis Date Noted  . Sickle cell anemia with crisis (HCC) 11/19/2017  . Symptomatic anemia 02/06/2017  . Leg wound, right 02/06/2017  . Sickle cell crisis (HCC) 10/24/2015  . Anemia of chronic disease   . Hepatitis C 05/11/2014  . 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 Thrombophlebitis leg superficial 03/04/2013  . Rheumatoid arthritis (HCC) 12/16/2012  . Hypothyroidism 11/27/2011    Past Surgical History:  Procedure Laterality Date  . ANKLE SURGERY  1989 and 1990  . CEREBRAL ANEURYSM REPAIR    . COLONOSCOPY  05/12/2012   Procedure: COLONOSCOPY;  Surgeon: Hart Carwinora M Brodie, MD;  Location: [facility] END[facility]COPY;  Service: Endoscopy;  Laterality: N/A;  . ESOPHAGOGASTRODUODENOSCOPY  05/12/2012   Procedure: ESOPHAGOGASTRODUODENOSCOPY (EGD);  Surgeon: Hart Carwinora M Brodie, MD;  [facility];  Service: Endoscopy;  Laterality: N/A;  . GIVENS CAPSULE STUDY  05/13/2012   Procedure: GIVENS CAPSULE STUDY;  Surgeon: Hart Carwinora M Brodie, MD;  Location: [facility] ENDOSCOPY;  Service: Endoscopy;   Laterality: N/A;     OB History   No obstetric history on file.      Home Medications    Prior to Admission medications   Medication Sig Start Date End Date Taking? Authorizing Provider  Ascorbic Acid (VITAMIN C PO) Take 500 mg elemental calcium/kg/hr by mouth daily.    [provider]  Calcium Carb-Cholecalciferol 600-800 MG-UNIT CHEW Chew 1 tablet by mouth daily. 02/14/17   Bing NeighborsHarris, Kimberly S, FNP  folic acid (FOLVITE) 800 MCG tablet Take 0.5 tablets (400 mcg total) by mouth daily. 08/06/18   Kallie LocksStroud, Natalie M, FNP  gabapentin (NEURONTIN) 100 MG capsule Take 1 capsule (100 mg total) by mouth at bedtime. 08/06/18   Kallie LocksStroud, Natalie M, FNP  HYDROcodone-acetaminophen (NORCO/VICODIN) 5-325 MG tablet Take 1 tablet by mouth every 4 (four) hours as needed. 11/17/18   Linwood DibblesKnapp, Carole Deere, MD  ibuprofen (ADVIL,MOTRIN) 600 MG tablet Take 1 tablet (600 mg total) by mouth every 6 (six) hours as needed. 08/06/18   Kallie LocksStroud, Natalie M, FNP  levothyroxine (SYNTHROID) 150 MCG tablet Take 1 tablet (150 mcg total) by mouth daily before breakfast. 08/06/18   Kallie LocksStroud, Natalie M, FNP  levothyroxine (SYNTHROID, LEVOTHROID) 25 MCG tablet Take 1 tablet (25 mcg total) by mouth daily before breakfast. 08/06/18   Kallie LocksStroud, Natalie M, FNP  Omega-3 Fatty Acids (FISH OIL) 1000 MG CAPS Take 1 capsule (1,000  mg total) by mouth daily. 08/06/18   Kallie Locks, FNP  oxyCODONE (OXY IR/ROXICODONE) 5 MG immediate release tablet Take 1 tablet (5 mg total) by mouth every 4 (four) hours as needed for severe pain. Patient not taking: Reported on 08/06/2018 11/18/17   Massie Maroon, FNP  predniSONE (DELTASONE) 5 MG tablet Take 1 tablet (5 mg total) by mouth daily with breakfast. 08/06/18   Kallie Locks, FNP  silver sulfADIAZINE (SILVADENE) 1 % cream Apply 1 application topically 2 (two) times daily. Clean site of wound and apply cream. Patient not taking: Reported on 01/14/2018 08/23/17   Bing Neighbors, FNP    Family History  Family History  Problem Relation Age of Onset  . Cancer Mother 42       Esophageal  . Sickle cell trait Mother   . Sickle cell trait Father   . HIV/AIDS Brother     Social History Social History   Tobacco Use  . Smoking status: Never Smoker  . Smokeless tobacco: Never Used  Substance Use Topics  . Alcohol use: No  . Drug use: No     Allergies   Ampicillin; Demerol [meperidine]; and Penicillins   Review of Systems Review of Systems  All other systems reviewed and are negative.    Physical Exam Updated Vital Signs BP 127/69 (BP Location: Right Arm)   Pulse 81   Temp 98.9 F (37.2 C) (Oral)   Resp 18   SpO2 98%   Physical Exam Vitals signs and nursing note reviewed.  Constitutional:      General: She is not in acute distress.    Appearance: She is well-developed.  HENT:     Head: Normocephalic and atraumatic.     Right Ear: External ear normal.     Left Ear: External ear normal.  Eyes:     General: No scleral icterus.       Right eye: No discharge.        Left eye: No discharge.     Conjunctiva/sclera: Conjunctivae normal.  Neck:     Musculoskeletal: Neck supple.     Trachea: No tracheal deviation.  Cardiovascular:     Rate and Rhythm: Normal rate.  Pulmonary:     Effort: Pulmonary effort is normal. No respiratory distress.     Breath sounds: No stridor.  Abdominal:     General: There is no distension.  Musculoskeletal:        General: No swelling or deformity.     Left shoulder: She exhibits tenderness and bony tenderness.     Left elbow: Normal.     Left wrist: Normal.  Skin:    General: Skin is warm and dry.     Findings: No rash.  Neurological:     Mental Status: She is alert.     Cranial Nerves: Cranial nerve deficit: no gross deficits.      ED Treatments / Results  Labs (all labs ordered are listed, but only abnormal results are displayed) Labs Reviewed - No data to display  EKG None  Radiology Dg Shoulder Left  Result  Date: 11/17/2018 CLINICAL DATA:  Recent fall.  Pain. EXAM: LEFT SHOULDER - 2+ VIEW COMPARISON:  None. FINDINGS: There is a step-off along the lateral aspect of the humeral head. No fracture line extends all the way through the humeral head or neck. Calcifications along the superolateral humeral head suggests calcific tendinosis in the rotator cuff tendon. No dislocation. IMPRESSION: 1. There is a step-off/cortical irregularity  along the lateral humeral head suggestive of a subtle fracture. However, the apparent fracture does not appear to extend all the way through the humeral head or neck. A CT scan could further evaluate if clinically warranted. No dislocation. 2. 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 Electronically Signed   By: Gerome Sam III M.D   On: 11/17/2018 13:50    Procedures Procedures (including critical care time)  Medications Ordered in ED Medications  ibuprofen (ADVIL) tablet 400 mg (400 mg Oral Given 11/17/18 1353)     Initial Impression / Assessment and Plan / ED Course  I have reviewed the triage vital signs and the nursing notes.  Pertinent labs & imaging results that were available during my care of the patient were reviewed by me and considered in my medical decision making (see chart for details).   Patient presented to the emergency room for evaluation of shoulder pain.  X-rays show the possibility of a hairline fracture.  CT scan suggested for further evaluation.  Think we can hold off on that right now as even if it is a definite fracture , treatment would be the same.  I will have the patient follow-up with orthopedics.  Discharged home with pain meds.  Final Clinical Impressions(s) / ED Diagnoses   Final diagnoses:  Closed fracture of proximal end of left humerus, unspecified fracture morphology, initial encounter    ED Discharge Orders         Ordered    HYDROcodone-acetaminophen (NORCO/VICODIN) 5-325 MG tablet  Every 4 hours PRN      11/17/18 1431           Linwood Dibbles, MD 11/17/18 1433

## 2018-11-17 NOTE — ED Notes (Signed)
Pt is alert and oriented x 4 and is verbally responsive. Pt is moaning and groaning and guarding left shoulder and reports 9/10 pain.

## 2018-11-17 NOTE — Discharge Instructions (Addendum)
The x-rays show the possibility of a hairline fracture in the shoulder.  Take the medications as needed for pain, follow-up to schedule an appointment with the orthopedic doctor.

## 2018-11-17 NOTE — ED Triage Notes (Signed)
Fell on Saturday and having left shoulder pain

## 2018-11-17 NOTE — ED Notes (Signed)
Bed: WA06 Expected date:  Expected time:  Means of arrival:  Comments: 

## 2018-11-19 NOTE — Telephone Encounter (Signed)
Note not needed 

## 2019-02-23 ENCOUNTER — Telehealth: Payer: Self-pay | Admitting: Family Medicine

## 2019-02-23 NOTE — Telephone Encounter (Signed)
Pharmacy called to get Prescriptions sent to [name], [place]

## 2019-02-24 ENCOUNTER — Telehealth: Payer: Self-pay

## 2019-02-24 NOTE — Telephone Encounter (Signed)
Patient has never been seen here. Needs to call Patient [name].

## 2019-02-24 NOTE — Telephone Encounter (Signed)
Called pharmacy to inform that they would have to call patient care center

## 2019-02-24 NOTE — Telephone Encounter (Signed)
Spoke with patient mother and she states that she will get in touch with her and have her call office to schedule appointment

## 2019-07-20 ENCOUNTER — Telehealth: Payer: Self-pay | Admitting: Family Medicine

## 2019-07-20 NOTE — Telephone Encounter (Signed)
Was told this is the wrong number no one by this name lived there

## 2019-07-28 ENCOUNTER — Other Ambulatory Visit: Payer: Self-pay | Admitting: Family Medicine

## 2019-07-29 ENCOUNTER — Ambulatory Visit: Payer: Medicare Other | Attending: Internal Medicine

## 2019-08-10 ENCOUNTER — Telehealth: Payer: Self-pay

## 2019-08-10 NOTE — Telephone Encounter (Signed)
Refills Rx's Levothyroxine, Gabapentin, Omega declined. Patient needs a follow up appointment ASAP. Messages left on both phone numbers in chart for patient to call back. Information faxed to [facility].

## 2019-08-18 ENCOUNTER — Other Ambulatory Visit: Payer: Self-pay | Admitting: Family Medicine

## 2019-08-18 NOTE — Telephone Encounter (Signed)
Please review and advise. Should she be taking 2 different doses of Levothyroxine? ( I cannot reach the patient by phone. Phone number needs to be corrected).

## 2019-08-19 ENCOUNTER — Telehealth: Payer: Self-pay

## 2019-08-19 NOTE — Telephone Encounter (Signed)
-----   Message from Kallie Locks, FNP sent at 08/18/2019  5:45 PM EST ----- Regarding: "Refill Request" Patient needs follow up appointment. We will give her 30 day supply of Synthroid until her appointment. Please inform patient.

## 2019-08-19 NOTE — Telephone Encounter (Signed)
Message left with patient's mother for Justice Britain to call and update her phone number. Patient will also need an appointment for refills.

## 2019-09-02 ENCOUNTER — Other Ambulatory Visit: Payer: Self-pay | Admitting: Family Medicine

## 2019-09-03 NOTE — Telephone Encounter (Signed)
Patient requesting refill. Please advise.

## 2019-10-08 ENCOUNTER — Encounter: Payer: Self-pay | Admitting: Family

## 2019-10-09 ENCOUNTER — Emergency Department (HOSPITAL_COMMUNITY)
Admission: EM | Admit: 2019-10-09 | Discharge: 2019-10-09 | Disposition: A | Discharge status: Home / Self Care | Payer: Medicare HMO | Attending: Emergency Medicine | Admitting: Emergency Medicine

## 2019-10-09 ENCOUNTER — Encounter (HOSPITAL_COMMUNITY): Payer: Self-pay

## 2019-10-09 ENCOUNTER — Other Ambulatory Visit: Payer: Self-pay

## 2019-10-09 DIAGNOSIS — Z5321 Procedure and treatment not carried out due to patient leaving prior to being seen by health care provider: Secondary | ICD-10-CM | POA: Diagnosis not present

## 2019-10-09 DIAGNOSIS — D649 Anemia, unspecified: Secondary | ICD-10-CM | POA: Insufficient documentation

## 2019-10-09 LAB — CBC
HCT: 17.2 % — ABNORMAL LOW (ref 36.0–46.0)
Hemoglobin: 5.9 g/dL — CL (ref 12.0–15.0)
MCH: 29.2 pg (ref 26.0–34.0)
MCHC: 34.3 g/dL (ref 30.0–36.0)
MCV: 85.1 fL (ref 80.0–100.0)
Platelets: 277 10*3/uL (ref 150–400)
RBC: 2.02 MIL/uL — ABNORMAL LOW (ref 3.87–5.11)
RDW: 26.9 % — ABNORMAL HIGH (ref 11.5–15.5)
WBC: 10.9 10*3/uL — ABNORMAL HIGH (ref 4.0–10.5)
nRBC: 18.2 % — ABNORMAL HIGH (ref 0.0–0.2)

## 2019-10-09 LAB — TYPE AND SCREEN
ABO/RH(D): B POS
Antibody Screen: NEGATIVE

## 2019-10-09 LAB — COMPREHENSIVE METABOLIC PANEL
ALT: 20 U/L (ref 0–44)
AST: 53 U/L — ABNORMAL HIGH (ref 15–41)
Albumin: 3.8 g/dL (ref 3.5–5.0)
Alkaline Phosphatase: 80 U/L (ref 38–126)
Anion gap: 7 (ref 5–15)
BUN: 5 mg/dL — ABNORMAL LOW (ref 6–20)
CO2: 25 mmol/L (ref 22–32)
Calcium: 9.4 mg/dL (ref 8.9–10.3)
Chloride: 106 mmol/L (ref 98–111)
Creatinine, Ser: 0.53 mg/dL (ref 0.44–1.00)
GFR calc Af Amer: >60 mL/min (ref >60)
GFR calc non Af Amer: >60 mL/min (ref >60)
Glucose, Bld: 96 mg/dL (ref 70–99)
Potassium: 3.6 mmol/L (ref 3.5–5.1)
Sodium: 138 mmol/L (ref 135–145)
Total Bilirubin: 3.3 mg/dL — ABNORMAL HIGH (ref 0.3–1.2)
Total Protein: 7.6 g/dL (ref 6.5–8.1)

## 2019-10-09 NOTE — ED Triage Notes (Addendum)
Arrived by cab. Patient sent by PCP for low hemoglobin 5.(something).  Patient reports she has sickle cell but hasn't had to get blood in over a year. Low O2 saturation, SHOB with walking

## 2019-10-09 NOTE — ED Notes (Signed)
Per PCP, states history of sickle cell-lab work shows hgb 5.4

## 2019-11-10 ENCOUNTER — Observation Stay (HOSPITAL_COMMUNITY)
Admission: EM | Admit: 2019-11-10 | Discharge: 2019-11-11 | Disposition: A | Discharge status: Home / Self Care | Payer: Medicare HMO | Attending: Internal Medicine | Admitting: Internal Medicine

## 2019-11-10 ENCOUNTER — Encounter (HOSPITAL_COMMUNITY): Payer: Self-pay | Admitting: Emergency Medicine

## 2019-11-10 ENCOUNTER — Other Ambulatory Visit: Payer: Self-pay

## 2019-11-10 DIAGNOSIS — Z885 Allergy status to narcotic agent status: Secondary | ICD-10-CM | POA: Diagnosis not present

## 2019-11-10 DIAGNOSIS — R5383 Other fatigue: Secondary | ICD-10-CM | POA: Insufficient documentation

## 2019-11-10 DIAGNOSIS — B182 Chronic viral hepatitis C: Secondary | ICD-10-CM

## 2019-11-10 DIAGNOSIS — Z832 Family history of diseases of the blood and blood-forming organs and certain disorders involving the immune mechanism: Secondary | ICD-10-CM | POA: Diagnosis not present

## 2019-11-10 DIAGNOSIS — E039 Hypothyroidism, unspecified: Secondary | ICD-10-CM | POA: Diagnosis not present

## 2019-11-10 DIAGNOSIS — F419 Anxiety disorder, unspecified: Secondary | ICD-10-CM | POA: Diagnosis not present

## 2019-11-10 DIAGNOSIS — B1920 Unspecified viral hepatitis C without hepatic coma: Secondary | ICD-10-CM | POA: Diagnosis present

## 2019-11-10 DIAGNOSIS — Z7952 Long term (current) use of systemic steroids: Secondary | ICD-10-CM | POA: Diagnosis not present

## 2019-11-10 DIAGNOSIS — D571 Sickle-cell disease without crisis: Secondary | ICD-10-CM | POA: Diagnosis present

## 2019-11-10 DIAGNOSIS — Z800 Family history of malignant neoplasm of digestive organs: Secondary | ICD-10-CM | POA: Insufficient documentation

## 2019-11-10 DIAGNOSIS — Z79899 Other long term (current) drug therapy: Secondary | ICD-10-CM | POA: Insufficient documentation

## 2019-11-10 DIAGNOSIS — Z880 Allergy status to penicillin: Secondary | ICD-10-CM | POA: Insufficient documentation

## 2019-11-10 DIAGNOSIS — Z791 Long term (current) use of non-steroidal anti-inflammatories (NSAID): Secondary | ICD-10-CM | POA: Insufficient documentation

## 2019-11-10 DIAGNOSIS — Z8679 Personal history of other diseases of the circulatory system: Secondary | ICD-10-CM | POA: Insufficient documentation

## 2019-11-10 DIAGNOSIS — E079 Disorder of thyroid, unspecified: Secondary | ICD-10-CM | POA: Diagnosis present

## 2019-11-10 DIAGNOSIS — Z8489 Family history of other specified conditions: Secondary | ICD-10-CM | POA: Diagnosis not present

## 2019-11-10 DIAGNOSIS — D638 Anemia in other chronic diseases classified elsewhere: Principal | ICD-10-CM | POA: Diagnosis present

## 2019-11-10 DIAGNOSIS — D649 Anemia, unspecified: Secondary | ICD-10-CM | POA: Diagnosis present

## 2019-11-10 DIAGNOSIS — M069 Rheumatoid arthritis, unspecified: Secondary | ICD-10-CM | POA: Diagnosis present

## 2019-11-10 DIAGNOSIS — Z20822 Contact with and (suspected) exposure to covid-19: Secondary | ICD-10-CM | POA: Diagnosis not present

## 2019-11-10 DIAGNOSIS — E038 Other specified hypothyroidism: Secondary | ICD-10-CM

## 2019-11-10 LAB — DIFFERENTIAL
Abs Immature Granulocytes: 0.08 10*3/uL — ABNORMAL HIGH (ref 0.00–0.07)
Basophils Absolute: 0.1 10*3/uL (ref 0.0–0.1)
Basophils Relative: 1 %
Eosinophils Absolute: 0.6 10*3/uL — ABNORMAL HIGH (ref 0.0–0.5)
Eosinophils Relative: 7 %
Immature Granulocytes: 1 %
Lymphocytes Relative: 40 %
Lymphs Abs: 3.9 10*3/uL (ref 0.7–4.0)
Monocytes Absolute: 1 10*3/uL (ref 0.1–1.0)
Monocytes Relative: 10 %
Neutro Abs: 4.1 10*3/uL (ref 1.7–7.7)
Neutrophils Relative %: 41 %

## 2019-11-10 LAB — PROTIME-INR
INR: 1.1 (ref 0.8–1.2)
Prothrombin Time: 14.4 seconds (ref 11.4–15.2)

## 2019-11-10 LAB — COMPREHENSIVE METABOLIC PANEL
ALT: 22 U/L (ref 0–44)
AST: 65 U/L — ABNORMAL HIGH (ref 15–41)
Albumin: 3.7 g/dL (ref 3.5–5.0)
Alkaline Phosphatase: 88 U/L (ref 38–126)
Anion gap: 8 (ref 5–15)
BUN: <5 mg/dL — ABNORMAL LOW (ref 6–20)
CO2: 23 mmol/L (ref 22–32)
Calcium: 9.5 mg/dL (ref 8.9–10.3)
Chloride: 108 mmol/L (ref 98–111)
Creatinine, Ser: 0.62 mg/dL (ref 0.44–1.00)
GFR calc Af Amer: >60 mL/min (ref >60)
GFR calc non Af Amer: >60 mL/min (ref >60)
Glucose, Bld: 103 mg/dL — ABNORMAL HIGH (ref 70–99)
Potassium: 3.9 mmol/L (ref 3.5–5.1)
Sodium: 139 mmol/L (ref 135–145)
Total Bilirubin: 3.1 mg/dL — ABNORMAL HIGH (ref 0.3–1.2)
Total Protein: 7.5 g/dL (ref 6.5–8.1)

## 2019-11-10 LAB — IRON AND TIBC
Iron: 71 ug/dL (ref 28–170)
Saturation Ratios: 18 % (ref 10.4–31.8)
TIBC: 395 ug/dL (ref 250–450)
UIBC: 324 ug/dL

## 2019-11-10 LAB — VITAMIN B12: Vitamin B-12: 293 pg/mL (ref 180–914)

## 2019-11-10 LAB — TSH: TSH: 147.044 u[IU]/mL — ABNORMAL HIGH (ref 0.350–4.500)

## 2019-11-10 LAB — CBC
HCT: 14.9 % — ABNORMAL LOW (ref 36.0–46.0)
HCT: 14.6 % — ABNORMAL LOW (ref 36.0–46.0)
Hemoglobin: 5.1 g/dL — CL (ref 12.0–15.0)
Hemoglobin: 4.9 g/dL — CL (ref 12.0–15.0)
MCH: 28.7 pg (ref 26.0–34.0)
MCH: 28.5 pg (ref 26.0–34.0)
MCHC: 34.2 g/dL (ref 30.0–36.0)
MCHC: 33.6 g/dL (ref 30.0–36.0)
MCV: 83.7 fL (ref 80.0–100.0)
MCV: 84.9 fL (ref 80.0–100.0)
Platelets: 201 10*3/uL (ref 150–400)
Platelets: 190 10*3/uL (ref 150–400)
RBC: 1.78 MIL/uL — ABNORMAL LOW (ref 3.87–5.11)
RBC: 1.72 MIL/uL — ABNORMAL LOW (ref 3.87–5.11)
RDW: 27 % — ABNORMAL HIGH (ref 11.5–15.5)
RDW: 27.2 % — ABNORMAL HIGH (ref 11.5–15.5)
WBC: 9.4 10*3/uL (ref 4.0–10.5)
WBC: 9.7 10*3/uL (ref 4.0–10.5)
nRBC: 11.1 % — ABNORMAL HIGH (ref 0.0–0.2)
nRBC: 11.4 % — ABNORMAL HIGH (ref 0.0–0.2)

## 2019-11-10 LAB — RETICULOCYTES: RBC.: 1.72 MIL/uL — ABNORMAL LOW (ref 3.87–5.11)

## 2019-11-10 LAB — FERRITIN: Ferritin: 48 ng/mL (ref 11–307)

## 2019-11-10 LAB — POC OCCULT BLOOD, ED: Fecal Occult Bld: NEGATIVE

## 2019-11-10 LAB — PREPARE RBC (CROSSMATCH)

## 2019-11-10 MED ORDER — ONDANSETRON HCL 4 MG PO TABS: 4.0000 mg | ORAL_TABLET | Freq: Four times a day (QID) | ORAL | Status: DC | PRN

## 2019-11-10 MED ORDER — ACETAMINOPHEN 325 MG PO TABS: 650.0000 mg | ORAL_TABLET | Freq: Every evening | ORAL | Status: DC | PRN

## 2019-11-10 MED ORDER — GABAPENTIN 300 MG PO CAPS
300.0000 mg | ORAL_CAPSULE | Freq: Every day | ORAL | Status: DC
  Administered 2019-11-10: 300 mg via ORAL
  Filled 2019-11-10: qty 1

## 2019-11-10 MED ORDER — FOLIC ACID 1 MG PO TABS
500.0000 ug | ORAL_TABLET | Freq: Two times a day (BID) | ORAL | Status: DC
  Administered 2019-11-10 – 2019-11-11 (×2): 0.5 mg via ORAL
  Filled 2019-11-10 (×2): qty 1

## 2019-11-10 MED ORDER — LEVOTHYROXINE SODIUM 75 MCG PO TABS
175.0000 ug | ORAL_TABLET | Freq: Every day | ORAL | Status: DC
  Administered 2019-11-11: 175 ug via ORAL
  Filled 2019-11-10: qty 1

## 2019-11-10 MED ORDER — VITAMIN D 25 MCG (1000 UNIT) PO TABS
1000.0000 [IU] | ORAL_TABLET | Freq: Every day | ORAL | Status: DC
  Administered 2019-11-11: 1000 [IU] via ORAL
  Filled 2019-11-10: qty 1

## 2019-11-10 MED ORDER — SODIUM CHLORIDE 0.9% IV SOLUTION: Freq: Once | INTRAVENOUS | Status: DC

## 2019-11-10 MED ORDER — ONDANSETRON HCL 4 MG/2ML IJ SOLN: 4.0000 mg | Freq: Four times a day (QID) | INTRAMUSCULAR | Status: DC | PRN

## 2019-11-10 NOTE — ED Notes (Signed)
Informed blood consent obtained by this RN.

## 2019-11-10 NOTE — ED Triage Notes (Signed)
Pt reports having a check up today and was told her Hgb was 4.7. Pt denies any weakness or SOB. Hx of anemia.

## 2019-11-10 NOTE — H&P (Signed)
History and Physical    [name] STM:[id number] DOB: 06-May-1961 DOA: 11/10/2019  PCP: Kallie Locks, FNP Patient coming from: home  Chief Complaint: fatigue  HPI: [name] is a 59 y.o. female with medical history significant of sickle cell disease with baseline hemoglobin 7, hypothyroidism, colon ulcer, anxiety who was sent by her PCP for fatigue and low hemoglobin.  Patient has sickle cell disease and follows up with her PCP as outpatient.  Patient reports that she has increased fatigue and tiredness over last 1 month.  Denies hematemesis, melena bright red blood per rectum.  Denies sickle cell pain crisis, fevers, chills, shortness of breath, heavy vaginal bleeding.  Her hemoglobin was 5.9 on 10/09/2019, and she was scheduled to have outpatient blood transfusion but did not receive transfusion because she could not wait.  She went back for her blood check by her PCP and was found to have hemoglobin 4.7.  Patient was instructed to come to ED for further evaluation.  In the emergency room, she was afebrile with pulse , RR 20, BP 138/88 and room air O2 sats 99%.  Labs showed hemoglobin 5.1, nonrevealing CMP, negative Hemoccult.   Review of Systems: As per HPI otherwise 10 point review of systems negative.  Review of Systems Otherwise negative except as per HPI, including: General: Denies fever, chills, night sweats or unintended weight loss.  Positive for fatigue Resp: Denies cough, wheezing, shortness of per rectum.. Cardiac: Denies chest pain, palpitations, orthopnea, paroxysmal nocturnal dyspnea. GI: Denies abdominal pain, nausea, vomiting, diarrhea or constipation GU: Denies dysuria, frequency, hesitancy or incontinence MS: Denies muscle aches, joint pain or swelling Neuro: Denies headache, neurologic deficits (focal weakness, numbness, tingling), abnormal gait Psych: Denies anxiety, depression, SI/HI/AVH Skin: Denies new rashes or lesions ID: Denies sick contacts, exotic  exposures, travel  Past Medical History:  Diagnosis Date  . Aneurysm (HCC)   . Anxiety   . Arthritis   . Colon ulcer   . Hypokalemia   . Hypothyroidism   . Pneumonia   . Sickle cell anemia (HCC)   . Ulcers of both lower extremities [facility])     Past Surgical History:  Procedure Laterality Date  . ANKLE SURGERY  1989 and 1990  . CEREBRAL ANEURYSM REPAIR    . COLONOSCOPY  05/12/2012   Procedure: COLONOSCOPY;  Surgeon: Hart Carwin, MD;  Location: [facility];  Service: Endoscopy;  Laterality: N/A;  . ESOPHAGOGASTRODUODENOSCOPY  05/12/2012   Procedure: ESOPHAGOGASTRODUODENOSCOPY (EGD);  Surgeon: Hart Carwin, MD;  Location: [facility];  Service: Endoscopy;  Laterality: N/A;  . GIVENS CAPSULE STUDY  05/13/2012   Procedure: GIVENS CAPSULE STUDY;  Surgeon: Hart Carwin, MD;  Location: [facility];  Service: Endoscopy;  Laterality: N/A;    SOCIAL HISTORY:  reports that she has never smoked. She has never used smokeless tobacco. She reports that she does not drink alcohol or use drugs.  Allergies  Allergen Reactions  . Ampicillin Other (See Comments)    Caused red bumps on the skin, but nothing else Per patient, she tolerates cephalosporins without problems (??)  . Demerol [Meperidine] Other (See Comments)    A high(er) dose caused seizure(s)    FAMILY HISTORY: Family History  Problem Relation Age of Onset  . Cancer Mother 31       Esophageal  . Sickle cell trait Mother   . Sickle cell trait Father   . HIV/AIDS Brother      Prior to Admission medications   Medication Sig Start  Date End Date Taking? Authorizing Provider  acetaminophen (TYLENOL) 650 MG CR tablet Take 650 mg by mouth at bedtime as needed for pain.   Yes [provider]  ascorbic acid (VITAMIN C) 500 MG tablet Take 500-1,000 mg by mouth daily.   Yes [provider]  Cholecalciferol (VITAMIN D-3) 25 MCG (1000 UT) CAPS Take 1,000 Units by mouth daily.   Yes [provider]    folic acid (FOLVITE) 400 MCG tablet Take 400 mcg by mouth in the morning and at bedtime.   Yes [provider]  gabapentin (NEURONTIN) 300 MG capsule Take 300 mg by mouth at bedtime.   Yes [provider]  ibuprofen (ADVIL,MOTRIN) 600 MG tablet Take 1 tablet (600 mg total) by mouth every 6 (six) hours as needed. Patient taking differently: Take 600 mg by mouth every 6 (six) hours as needed (for pain).  08/06/18  Yes Kallie Locks, FNP  levothyroxine (SYNTHROID) 175 MCG tablet Take 175 mcg by mouth daily before breakfast.   Yes [provider]  omega-3 acid ethyl esters (LOVAZA) 1 g capsule Take 1 g by mouth daily.   Yes [provider]  Calcium Carb-Cholecalciferol 600-800 MG-UNIT CHEW Chew 1 tablet by mouth daily. Patient not taking: Reported on 11/10/2019 02/14/17   Bing Neighbors, FNP  folic acid (FOLVITE) 800 MCG tablet Take 0.5 tablets (400 mcg total) by mouth daily. Patient not taking: Reported on 11/10/2019 08/06/18   Kallie Locks, FNP  gabapentin (NEURONTIN) 100 MG capsule Take 1 capsule (100 mg total) by mouth at bedtime. Patient not taking: Reported on 11/10/2019 08/06/18   Kallie Locks, FNP  HYDROcodone-acetaminophen (NORCO/VICODIN) 5-325 MG tablet Take 1 tablet by mouth every 4 (four) hours as needed. Patient not taking: Reported on 11/10/2019 11/17/18   Linwood Dibbles, MD  levothyroxine (SYNTHROID) 150 MCG tablet Take 1 tablet (150 mcg total) by mouth daily before breakfast. Patient not taking: Reported on 11/10/2019 08/06/18   Kallie Locks, FNP  levothyroxine (SYNTHROID, LEVOTHROID) 25 MCG tablet Take 1 tablet (25 mcg total) by mouth daily before breakfast. Patient not taking: Reported on 11/10/2019 08/06/18   Kallie Locks, FNP  Omega-3 Fatty Acids (FISH OIL) 1000 MG CAPS Take 1 capsule (1,000 mg total) by mouth daily. Patient not taking: Reported on 11/10/2019 08/06/18   Kallie Locks, FNP  oxyCODONE (OXY IR/ROXICODONE) 5 MG  immediate release tablet Take 1 tablet (5 mg total) by mouth every 4 (four) hours as needed for severe pain. Patient not taking: Reported on 11/10/2019 11/18/17   Massie Maroon, FNP  predniSONE (DELTASONE) 5 MG tablet Take 1 tablet (5 mg total) by mouth daily with breakfast. Patient not taking: Reported on 11/10/2019 08/06/18   Kallie Locks, FNP  silver sulfADIAZINE (SILVADENE) 1 % cream Apply 1 application topically 2 (two) times daily. Clean site of wound and apply cream. Patient not taking: Reported on 11/10/2019 08/23/17   Bing Neighbors, FNP    Physical Exam: Vitals:   11/10/19 1930 11/10/19 1945 11/10/19 2000 11/10/19 2015  BP: 138/70 117/88 136/78 131/81  Pulse: 60  71 68  Resp: 16 17 17 14   Temp:      TempSrc:      SpO2: 100%  96% 97%  Weight:      Height:          Constitutional: NAD, calm, comfortable.  Pale Eyes: PERRL, lids and conjunctivae normal ENMT: Mucous membranes are moist. Posterior pharynx clear of  any exudate or lesions.Normal dentition.  Neck: normal, supple, no masses, no thyromegaly Respiratory: clear to auscultation bilaterally, no wheezing, no crackles. Normal respiratory effort. No accessory muscle use.  Cardiovascular: Regular rate and rhythm, no murmurs / rubs / gallops. No extremity edema. 2+ pedal pulses. No carotid bruits.  Abdomen: no tenderness, no masses palpated. No hepatosplenomegaly. Bowel sounds positive.  Musculoskeletal: no clubbing / cyanosis. No joint deformity upper and lower extremities. Good ROM, no contractures. Normal muscle tone.  Skin: no rashes, lesions, ulcers. No induration Neurologic: CN 2-12 grossly intact. Sensation intact, DTR normal. Strength 5/5 in all 4.  Psychiatric: Normal judgment and insight. Alert and oriented x 3. Normal mood.     Labs on Admission: I have personally reviewed following labs and imaging studies  CBC: Recent Labs  Lab 11/10/19 1454  WBC 9.4  HGB 5.1*  HCT 14.9*  MCV 83.7  PLT 322    Basic Metabolic Panel: Recent Labs  Lab 11/10/19 1454  Marchele Decock 139  K 3.9  CL 108  CO2 23  GLUCOSE 103*  BUN <5*  CREATININE 0.62  CALCIUM 9.5   GFR: Estimated Creatinine Clearance: 78.7 mL/min (by C-G formula based on SCr of 0.62 mg/dL). Liver Function Tests: Recent Labs  Lab 11/10/19 1454  AST 65*  ALT 22  ALKPHOS 88  BILITOT 3.1*  PROT 7.5  ALBUMIN 3.7   No results for input(s): LIPASE, AMYLASE in the last 168 hours. No results for input(s): AMMONIA in the last 168 hours. Coagulation Profile: No results for input(s): INR, PROTIME in the last 168 hours. Cardiac Enzymes: No results for input(s): CKTOTAL, CKMB, CKMBINDEX, TROPONINI in the last 168 hours. BNP (last 3 results) No results for input(s): PROBNP in the last 8760 hours. HbA1C: No results for input(s): HGBA1C in the last 72 hours. CBG: No results for input(s): GLUCAP in the last 168 hours. Lipid Profile: No results for input(s): CHOL, HDL, LDLCALC, TRIG, CHOLHDL, LDLDIRECT in the last 72 hours. Thyroid Function Tests: No results for input(s): TSH, T4TOTAL, FREET4, T3FREE, THYROIDAB in the last 72 hours. Anemia Panel: No results for input(s): VITAMINB12, FOLATE, FERRITIN, TIBC, IRON, RETICCTPCT in the last 72 hours. Urine analysis:    Component Value Date/Time   COLORURINE YELLOW 02/04/2017 2010   APPEARANCEUR CLEAR 02/04/2017 2010   LABSPEC 1.015 09/24/2017 1451   PHURINE 7.0 09/24/2017 1451   GLUCOSEU NEGATIVE 09/24/2017 1451   HGBUR NEGATIVE 09/24/2017 1451   BILIRUBINUR small (A) 08/06/2018 1536   KETONESUR negative 08/06/2018 1536   KETONESUR NEGATIVE 09/24/2017 1451   PROTEINUR =100 (A) 08/06/2018 1536   PROTEINUR NEGATIVE 09/24/2017 1451   UROBILINOGEN 4.0 (A) 08/06/2018 1536   UROBILINOGEN 4.0 (H) 09/24/2017 1451   NITRITE Negative 08/06/2018 1536   NITRITE NEGATIVE 09/24/2017 1451   LEUKOCYTESUR Trace (A) 08/06/2018 1536   Sepsis Labs:  !!!!!!!!!!!!!!!!!!!!!!!!!!!!!!!!!!!!!!!!!!!! @LABRCNTIP (procalcitonin:4,lacticidven:4) )No results found for this or any previous visit (from the past 240 hour(s)).   Radiological Exams on Admission: No results found.   All images have been reviewed by me personally.  EKG: Independently reviewed.   Assessment/Plan Principal Problem:   Acute on chronic anemia Active Problems:   Hypothyroidism   Rheumatoid arthritis ([place])   Hb-SS disease without crisis (HCC)   Hepatitis C   Anemia of chronic disease   Symptomatic anemia  #Acute on chronic anemia #Symptomatic anemia #Sickle cell disease with baseline hemoglobin 7.0  Patient presented with fatigue x1 month and a hemoglobin 4.7 at PCP office and 5.1 in  the ED today.  Denies bleedings.  Hemoccult negative at ED. The etiology likely related to her sickle cell disease.  She denies pain  -Admit for observation telemetry monitoring -She is hemodynamically stable -Follow-up CBC -Hemoccult negative x1 in the ED -Pathology smear pending -Anemia work-up pending -Blood transfusion 2 units   #Hypothyroidism  -TSH pending -Continue home meds   #Anxiety -chronic and at her baseline   #History of hep C #History of rheumatoid arthritis -chronic and at baseline        DVT prophylaxis: SCD Code Status: Full code Family Communication: discuss with her adult daughter who is at bedside.  Disposition Plan:  home Consults called: None Admission status: obs   Time Spent: 65 minutes.  >50% of the time was devoted to discussing the patients care, assessment, plan and disposition with other care givers along with counseling the patient about the risks and benefits of treatment.    Dede Query MD [facility]  If 7PM-7AM, please contact night-coverage   11/10/2019, 8:26 PM

## 2019-11-10 NOTE — ED Provider Notes (Signed)
[name]   CSN: [id number] Arrival date & time: 11/10/19  1437     History Chief Complaint  Patient presents with  . Anemia    [name] is a 59 y.o. female.  59 yo F with a chief complaints of anemia.  This is unnoticed to the patient.  She is not having any symptoms.  She went to see her family doctor today and they told her hemoglobin was 4.7 and told her she needed to come to the ED for transfusion.  She has had a history of sickle cell anemia and has required transfusions in the past.  She denies dark stool or blood in her stool denies abdominal pain denies sickle cell pain crisis denies chest pain or shortness of breath denies fatigue.  Denies heavy vaginal bleeding.   The history is provided by the patient.  Anemia This is a new problem. The current episode started 2 days ago. The problem occurs constantly. The problem has not changed since onset.Pertinent negatives include no chest pain, no headaches and no shortness of breath. Nothing aggravates the symptoms. Nothing relieves the symptoms. She has tried nothing for the symptoms. The treatment provided no relief.       Past Medical History:  Diagnosis Date  . Aneurysm ([place])   . Anxiety   . Arthritis   . Colon ulcer   . Hypokalemia   . Hypothyroidism   . Pneumonia   . Sickle cell anemia (HCC)   . Ulcers of both lower extremities [facility])     Patient Active Problem List   Diagnosis Date Noted  . Sickle cell anemia with crisis ([place]) 11/19/2017  . Symptomatic anemia 02/06/2017  . Leg wound, right 02/06/2017  . Sickle cell crisis ([place]) 10/24/2015  . Anemia of chronic disease   . Hepatitis C 05/11/2014  . Hb-SS disease without crisis ([place]) 03/04/2013  . Thrombophlebitis leg superficial 03/04/2013  . Rheumatoid arthritis ([place]) 12/16/2012  . Hypothyroidism 11/27/2011    Past Surgical History:  Procedure Laterality Date  . Power  .  CEREBRAL ANEURYSM REPAIR    . COLONOSCOPY  05/12/2012   Procedure: COLONOSCOPY;  Surgeon: Lafayette Dragon, MD;  Location: [facility];  Service: Endoscopy;  Laterality: N/A;  . ESOPHAGOGASTRODUODENOSCOPY  05/12/2012   Procedure: ESOPHAGOGASTRODUODENOSCOPY (EGD);  Surgeon: Lafayette Dragon, MD;  Location: [facility];  Service: Endoscopy;  Laterality: N/A;  . GIVENS CAPSULE STUDY  05/13/2012   Procedure: GIVENS CAPSULE STUDY;  Surgeon: Lafayette Dragon, MD;  Location: [facility]COPY;  Service: Endoscopy;  Laterality: N/A;     OB History   No obstetric history on file.     Family History  Problem Relation Age of Onset  . Cancer Mother 30       Esophageal  . Sickle cell trait Mother   . Sickle cell trait Father   . HIV/AIDS Brother     Social History   Tobacco Use  . Smoking status: Never Smoker  . Smokeless tobacco: Never Used  Substance Use Topics  . Alcohol use: No  . Drug use: No    Home Medications Prior to Admission medications   Medication Sig Start Date End Date Taking? Authorizing Provider  acetaminophen (TYLENOL) 650 MG CR tablet Take 650 mg by mouth at bedtime as needed for pain.   Yes [provider]  ascorbic acid (VITAMIN C) 500 MG tablet Take 500-1,000 mg by mouth daily.  Yes [provider]  Cholecalciferol (VITAMIN D-3) 25 MCG (1000 UT) CAPS Take 1,000 Units by mouth daily.   Yes [provider]  folic acid (FOLVITE) 400 MCG tablet Take 400 mcg by mouth in the morning and at bedtime.   Yes [provider]  gabapentin (NEURONTIN) 300 MG capsule Take 300 mg by mouth at bedtime.   Yes [provider]  ibuprofen (ADVIL,MOTRIN) 600 MG tablet Take 1 tablet (600 mg total) by mouth every 6 (six) hours as needed. Patient taking differently: Take 600 mg by mouth every 6 (six) hours as needed (for pain).  08/06/18  Yes Kallie Locks, FNP  levothyroxine (SYNTHROID) 175 MCG tablet Take 175 mcg by mouth daily before breakfast.   Yes  [provider]  omega-3 acid ethyl esters (LOVAZA) 1 g capsule Take 1 g by mouth daily.   Yes [provider]  Calcium Carb-Cholecalciferol 600-800 MG-UNIT CHEW Chew 1 tablet by mouth daily. Patient not taking: Reported on 11/10/2019 02/14/17   Bing Neighbors, FNP  folic acid (FOLVITE) 800 MCG tablet Take 0.5 tablets (400 mcg total) by mouth daily. Patient not taking: Reported on 11/10/2019 08/06/18   Kallie Locks, FNP  gabapentin (NEURONTIN) 100 MG capsule Take 1 capsule (100 mg total) by mouth at bedtime. Patient not taking: Reported on 11/10/2019 08/06/18   Kallie Locks, FNP  HYDROcodone-acetaminophen (NORCO/VICODIN) 5-325 MG tablet Take 1 tablet by mouth every 4 (four) hours as needed. Patient not taking: Reported on 11/10/2019 11/17/18   Linwood Dibbles, MD  levothyroxine (SYNTHROID) 150 MCG tablet Take 1 tablet (150 mcg total) by mouth daily before breakfast. Patient not taking: Reported on 11/10/2019 08/06/18   Kallie Locks, FNP  levothyroxine (SYNTHROID, LEVOTHROID) 25 MCG tablet Take 1 tablet (25 mcg total) by mouth daily before breakfast. Patient not taking: Reported on 11/10/2019 08/06/18   Kallie Locks, FNP  Omega-3 Fatty Acids (FISH OIL) 1000 MG CAPS Take 1 capsule (1,000 mg total) by mouth daily. Patient not taking: Reported on 11/10/2019 08/06/18   Kallie Locks, FNP  oxyCODONE (OXY IR/ROXICODONE) 5 MG immediate release tablet Take 1 tablet (5 mg total) by mouth every 4 (four) hours as needed for severe pain. Patient not taking: Reported on 11/10/2019 11/18/17   Massie Maroon, FNP  predniSONE (DELTASONE) 5 MG tablet Take 1 tablet (5 mg total) by mouth daily with breakfast. Patient not taking: Reported on 11/10/2019 08/06/18   Kallie Locks, FNP  silver sulfADIAZINE (SILVADENE) 1 % cream Apply 1 application topically 2 (two) times daily. Clean site of wound and apply cream. Patient not taking: Reported on 11/10/2019 08/23/17   Bing Neighbors, FNP     Allergies    Ampicillin and Demerol [meperidine]  Review of Systems   Review of Systems  Constitutional: Negative for chills and fever.  HENT: Negative for congestion and rhinorrhea.   Eyes: Negative for redness and visual disturbance.  Respiratory: Negative for shortness of breath and wheezing.   Cardiovascular: Negative for chest pain and palpitations.  Gastrointestinal: Negative for nausea and vomiting.  Genitourinary: Negative for dysuria and urgency.  Musculoskeletal: Negative for arthralgias and myalgias.  Skin: Negative for pallor and wound.  Neurological: Negative for dizziness and headaches.    Physical Exam Updated Vital Signs BP 118/63   Pulse (!) 59   Temp 98.5 F (36.9 C) (Oral)   Resp 14   Ht 5\' 11"  (1.803 m)   Wt 65.8 kg  SpO2 93%   BMI 20.22 kg/m   Physical Exam Vitals and nursing Hall reviewed.  Constitutional:      General: She is not in acute distress.    Appearance: She is well-developed. She is not diaphoretic.     Comments: Pale conjunctive  HENT:     Head: Normocephalic and atraumatic.  Eyes:     Pupils: Pupils are equal, round, and reactive to light.  Cardiovascular:     Rate and Rhythm: Normal rate and regular rhythm.     Heart sounds: No murmur. No friction rub. No gallop.   Pulmonary:     Effort: Pulmonary effort is normal.     Breath sounds: No wheezing or rales.  Abdominal:     General: There is no distension.     Palpations: Abdomen is soft.     Tenderness: There is no abdominal tenderness.  Musculoskeletal:        General: No tenderness.     Cervical back: Normal range of motion and neck supple.  Skin:    General: Skin is warm and dry.  Neurological:     Mental Status: She is alert and oriented to person, place, and time.  Psychiatric:        Behavior: Behavior normal.     ED Results / Procedures / Treatments   Labs (all labs ordered are listed, but only abnormal results are displayed) Labs Reviewed  COMPREHENSIVE  METABOLIC PANEL - Abnormal; Notable for the following components:      Result Value   Glucose, Bld 103 (*)    BUN <5 (*)    AST 65 (*)    Total Bilirubin 3.1 (*)    All other components within normal limits  CBC - Abnormal; Notable for the following components:   RBC 1.78 (*)    Hemoglobin 5.1 (*)    HCT 14.9 (*)    RDW 27.0 (*)    nRBC 11.1 (*)    All other components within normal limits  SARS CORONAVIRUS 2 (TAT 6-24 HRS)  PATHOLOGIST SMEAR REVIEW  TYPE AND SCREEN  PREPARE RBC (CROSSMATCH)    EKG None  Radiology No results found.  Procedures Procedures (including critical care time)  Medications Ordered in ED Medications  0.9 %  sodium chloride infusion (Manually program via Guardrails IV Fluids) (has no administration in time range)    ED Course  I have reviewed the triage vital signs and the nursing notes.  Pertinent labs & imaging results that were available during my care of the patient were reviewed by me and considered in my medical decision making (see chart for details).    MDM Rules/Calculators/A&P                      59 yo F with a chief complaint of anemia.  Patient has no symptoms but was found to have a hemoglobin of 4.7 her doctor's office today.  Repeat here is 5.1.  Will transfuse 2 units.  Will discuss with medicine for admission.  CRITICAL CARE Performed by: Rae Roam   Total critical care time: 35 minutes  Critical care time was exclusive of separately billable procedures and treating other patients.  Critical care was necessary to treat or prevent imminent or life-threatening deterioration.  Critical care was time spent personally by me on the following activities: development of treatment plan with patient and/or surrogate as well as nursing, discussions with consultants, evaluation of patient's response to treatment, examination of  patient, obtaining history from patient or surrogate, ordering and performing treatments and  interventions, ordering and review of laboratory studies, ordering and review of radiographic studies, pulse oximetry and re-evaluation of patient's condition.  The patients results and plan were reviewed and discussed.   Any x-rays performed were independently reviewed by myself.   Differential diagnosis were considered with the presenting HPI.  Medications  0.9 %  sodium chloride infusion (Manually program via Guardrails IV Fluids) (has no administration in time range)    Vitals:   11/10/19 1447 11/10/19 1549 11/10/19 1923 11/10/19 1924  BP: (!) 115/45 (!) 121/56 118/63   Pulse: 74 73  (!) 59  Resp: 16 20 13 14   Temp: 98.5 F (36.9 C)     TempSrc: Oral     SpO2: 92% 99%  93%  Weight: 65.8 kg     Height: 5\' 11"  (1.803 m)       Final diagnoses:  Acute on chronic anemia    Admission/ observation were discussed with the admitting physician, patient and/or family and they are comfortable with the plan.    Final Clinical Impression(s) / ED Diagnoses Final diagnoses:  Acute on chronic anemia    Rx / DC Orders ED Discharge Orders    None       , DO 11/10/19 1928

## 2019-11-11 DIAGNOSIS — D649 Anemia, unspecified: Secondary | ICD-10-CM | POA: Diagnosis not present

## 2019-11-11 DIAGNOSIS — D638 Anemia in other chronic diseases classified elsewhere: Secondary | ICD-10-CM | POA: Diagnosis not present

## 2019-11-11 LAB — CBC
HCT: 22 % — ABNORMAL LOW (ref 36.0–46.0)
HCT: 21.4 % — ABNORMAL LOW (ref 36.0–46.0)
Hemoglobin: 7.7 g/dL — ABNORMAL LOW (ref 12.0–15.0)
Hemoglobin: 7.4 g/dL — ABNORMAL LOW (ref 12.0–15.0)
MCH: 29.2 pg (ref 26.0–34.0)
MCH: 29 pg (ref 26.0–34.0)
MCHC: 35 g/dL (ref 30.0–36.0)
MCHC: 34.6 g/dL (ref 30.0–36.0)
MCV: 83.3 fL (ref 80.0–100.0)
MCV: 83.9 fL (ref 80.0–100.0)
Platelets: 234 10*3/uL (ref 150–400)
Platelets: 221 10*3/uL (ref 150–400)
RBC: 2.64 MIL/uL — ABNORMAL LOW (ref 3.87–5.11)
RBC: 2.55 MIL/uL — ABNORMAL LOW (ref 3.87–5.11)
RDW: 23.1 % — ABNORMAL HIGH (ref 11.5–15.5)
RDW: 23.6 % — ABNORMAL HIGH (ref 11.5–15.5)
WBC: 9 10*3/uL (ref 4.0–10.5)
WBC: 8.5 10*3/uL (ref 4.0–10.5)
nRBC: 11.7 % — ABNORMAL HIGH (ref 0.0–0.2)
nRBC: 13.2 % — ABNORMAL HIGH (ref 0.0–0.2)

## 2019-11-11 LAB — COMPREHENSIVE METABOLIC PANEL
ALT: 23 U/L (ref 0–44)
AST: 66 U/L — ABNORMAL HIGH (ref 15–41)
Albumin: 3.5 g/dL (ref 3.5–5.0)
Alkaline Phosphatase: 79 U/L (ref 38–126)
Anion gap: 8 (ref 5–15)
BUN: <5 mg/dL — ABNORMAL LOW (ref 6–20)
CO2: 22 mmol/L (ref 22–32)
Calcium: 9.5 mg/dL (ref 8.9–10.3)
Chloride: 109 mmol/L (ref 98–111)
Creatinine, Ser: 0.77 mg/dL (ref 0.44–1.00)
GFR calc Af Amer: >60 mL/min (ref >60)
GFR calc non Af Amer: >60 mL/min (ref >60)
Glucose, Bld: 90 mg/dL (ref 70–99)
Potassium: 3.9 mmol/L (ref 3.5–5.1)
Sodium: 139 mmol/L (ref 135–145)
Total Bilirubin: 3.2 mg/dL — ABNORMAL HIGH (ref 0.3–1.2)
Total Protein: 7.3 g/dL (ref 6.5–8.1)

## 2019-11-11 LAB — PATHOLOGIST SMEAR REVIEW

## 2019-11-11 LAB — BPAM RBC
Blood Product Expiration Date: 202105032359
Blood Product Expiration Date: 202105032359
ISSUE DATE / TIME: 202104202116
ISSUE DATE / TIME: 202104202359
Unit Type and Rh: 1700
Unit Type and Rh: 1700

## 2019-11-11 LAB — TYPE AND SCREEN
ABO/RH(D): B POS
Antibody Screen: NEGATIVE
Unit division: 0
Unit division: 0

## 2019-11-11 LAB — SARS CORONAVIRUS 2 (TAT 6-24 HRS): SARS Coronavirus 2: NEGATIVE

## 2019-11-11 MED ORDER — DIPHENHYDRAMINE HCL 25 MG PO CAPS: 25.0000 mg | ORAL_CAPSULE | Freq: Four times a day (QID) | ORAL | Status: DC | PRN

## 2019-11-11 MED ORDER — DIPHENHYDRAMINE HCL 50 MG/ML IJ SOLN
INTRAMUSCULAR | Status: AC
  Administered 2019-11-11: 25 mg via INTRAVENOUS
  Filled 2019-11-11: qty 1

## 2019-11-11 MED ORDER — DIPHENHYDRAMINE HCL 50 MG/ML IJ SOLN
25.0000 mg | Freq: Four times a day (QID) | INTRAMUSCULAR | Status: DC | PRN
  Administered 2019-11-11: 25 mg via INTRAVENOUS

## 2019-11-11 NOTE — ED Notes (Signed)
Patient verbalizes understanding of discharge instructions. Opportunity for questioning and answers were provided. Armband removed by staff. Patient discharged from ED. Signature pad not working at this time.

## 2019-11-11 NOTE — Discharge Instructions (Signed)
Anemia  Anemia is a condition in which you do not have enough red blood cells or hemoglobin. Hemoglobin is a substance in red blood cells that carries oxygen. When you do not have enough red blood cells or hemoglobin (are anemic), your body cannot get enough oxygen and your organs may not work properly. As a result, you may feel very tired or have other problems. What are the causes? Common causes of anemia include:  Excessive bleeding. Anemia can be caused by excessive bleeding inside or outside the body, including bleeding from the intestine or from periods in women.  Poor nutrition.  Long-lasting (chronic) kidney, thyroid, and liver disease.  Bone marrow disorders.  Cancer and treatments for cancer.  HIV (human immunodeficiency virus) and AIDS (acquired immunodeficiency syndrome).  Treatments for HIV and AIDS.  Spleen problems.  Blood disorders.  Infections, medicines, and autoimmune disorders that destroy red blood cells. What are the signs or symptoms? Symptoms of this condition include:  Minor weakness.  Dizziness.  Headache.  Feeling heartbeats that are irregular or faster than normal (palpitations).  Shortness of breath, especially with exercise.  Paleness.  Cold sensitivity.  Indigestion.  Nausea.  Difficulty sleeping.  Difficulty concentrating. Symptoms may occur suddenly or develop slowly. If your anemia is mild, you may not have symptoms. How is this diagnosed? This condition is diagnosed based on:  Blood tests.  Your medical history.  A physical exam.  Bone marrow biopsy. Your health care provider may also check your stool (feces) for blood and may do additional testing to look for the cause of your bleeding. You may also have other tests, including:  Imaging tests, such as a CT scan or MRI.  Endoscopy.  Colonoscopy. How is this treated? Treatment for this condition depends on the cause. If you continue to lose a lot of blood, you may  need to be treated at a hospital. Treatment may include:  Taking supplements of iron, vitamin S31, or folic acid.  Taking a hormone medicine (erythropoietin) that can help to stimulate red blood cell growth.  Having a blood transfusion. This may be needed if you lose a lot of blood.  Making changes to your diet.  Having surgery to remove your spleen. Follow these instructions at home:  Take over-the-counter and prescription medicines only as told by your health care provider.  Take supplements only as told by your health care provider.  Follow any diet instructions that you were given.  Keep all follow-up visits as told by your health care provider. This is important. Contact a health care provider if:  You develop new bleeding anywhere in the body. Get help right away if:  You are very weak.  You are short of breath.  You have pain in your abdomen or chest.  You are dizzy or feel faint.  You have trouble concentrating.  You have bloody or black, tarry stools.  You vomit repeatedly or you vomit up blood. Summary  Anemia is a condition in which you do not have enough red blood cells or enough of a substance in your red blood cells that carries oxygen (hemoglobin).  Symptoms may occur suddenly or develop slowly.  If your anemia is mild, you may not have symptoms.  This condition is diagnosed with blood tests as well as a medical history and physical exam. Other tests may be needed.  Treatment for this condition depends on the cause of the anemia. This information is not intended to replace advice given to you by  your health care provider. Make sure you discuss any questions you have with your health care provider. Document Revised: 06/21/2017 Document Reviewed: 08/10/2016 Elsevier Patient Education  [place].

## 2019-11-11 NOTE — Discharge Summary (Signed)
Physician Discharge Summary  [name] OVZ:[id number] DOB: Dec 31, 1960 DOA: 11/10/2019  PCP: Kallie Locks, FNP  Admit date: 11/10/2019  Discharge date: 11/11/2019  Discharge Diagnoses:  Principal Problem:   Acute on chronic anemia Active Problems:   Hypothyroidism   Rheumatoid arthritis (HCC)   Hb-SS disease without crisis (HCC)   Hepatitis C   Anemia of chronic disease   Symptomatic anemia   Discharge Condition: Stable  Disposition:  Follow-up Information    Raymon Mutton., FNP Follow up in 1 week(s).   Specialty: Family Medicine Contact information: [address] [place] [address] [phone]          Pt is discharged home in good condition and is to follow up with Kallie Locks, FNP this week to have labs evaluated. Blenda Wisecup is instructed to increase activity slowly and balance with rest for the next few days, and use prescribed medication to complete treatment of pain  Diet: Regular Wt Readings from Last 3 Encounters:  11/10/19 65.8 kg  08/06/18 71.2 kg  01/14/18 69.4 kg    History of present illness:  [name] is a 59 year old female with a medical history significant for sickle cell disease with a baseline hemoglobin of 7.0 g/dL, hypothyroidism, colon ulcer, anxiety who was sent by her PCP Fatima Sanger, FNP for fatigue and low hemoglobin.  Patient has sickle cell disease and follows up with her PCP as an outpatient.  She reports increased fatigue and tiredness over the past month.  Patient denies hematemesis, melena or bright red blood per rectum.  Denies sickle cell pain crisis, fevers, chills, shortness of breath, heavy vaginal bleeding.  Her hemoglobin was 5.9 on 10/09/2019, and she was scheduled to have outpatient blood transfusion, but did not receive because she could not wait.  She went back for blood checked by her PCP and was found to have a hemoglobin of 4.7.  Patient was instructed to report to ER for further evaluation.  In the ER,  patient was afebrile with stable pulse, RR 20, BP 138/88, and oxygen saturation 99% on RA.  Labs showed hemoglobin of 5.1, nonrevealing CMP, and negative Hemoccult.  Patient was admitted overnight in observation for transfusions of 2 units of packed red blood cells.   Hospital Course:  Symptomatic anemia without sickle cell pain crisis: Patient received 2 units of packed red blood cells throughout observation without complication.  Hemoglobin returned to 7.4 which is consistent with patient's baseline.  Patient advised to follow-up with Fatima Sanger, FNP to repeat CBC in 1 week. Patient also advised to resume all home medications.  Ms. Sonnier is alert, oriented, and ambulating without assistance.    Patient is requesting immediate discharge home, she says she has animals to care for.   Ms. Grieves is afebrile and oxygen saturation is 99% on RA.  Patient has not had any events on cardiac monitor.  She will discharge home today in a hemodynamically stable condition.   Discharge Exam: Vitals:   11/11/19 1445 11/11/19 1500  BP:  118/68  Pulse: 64 63  Resp: 15 16  Temp:    SpO2: 98% 98%   Vitals:   11/11/19 1400 11/11/19 1415 11/11/19 1445 11/11/19 1500  BP: (!) 130/58   118/68  Pulse: 63 65 64 63  Resp: 17 16 15 16   Temp:      TempSrc:      SpO2: 100% 99% 98% 98%  Weight:      Height:  General appearance : Awake, alert, not in any distress. Speech Clear. Not toxic looking HEENT: Atraumatic and Normocephalic, pupils equally reactive to light and accomodation Neck: Supple, no JVD. No cervical lymphadenopathy.  Chest: Good air entry bilaterally, no added sounds  CVS: S1 S2 regular, no murmurs.  Abdomen: Bowel sounds present, Non tender and not distended with no gaurding, rigidity or rebound. Extremities: B/L Lower Ext shows no edema, both legs are warm to touch Neurology: Awake alert, and oriented X 3, CN II-XII intact, Non focal Skin: No Rash  Discharge Instructions  Discharge  Instructions    Discharge patient   Complete by: As directed    Discharge disposition: 01-Home or Self Care   Discharge patient date: 11/11/2019     Allergies as of 11/11/2019      Reactions   Ampicillin Other (See Comments)   Caused red bumps on the skin, but nothing else Per patient, she tolerates cephalosporins without problems (??)   Demerol [meperidine] Other (See Comments)   A high(er) dose caused seizure(s)      Medication List    STOP taking these medications   HYDROcodone-acetaminophen 5-325 MG tablet Commonly known as: NORCO/VICODIN     TAKE these medications   acetaminophen 650 MG CR tablet Commonly known as: TYLENOL Take 650 mg by mouth at bedtime as needed for pain.   ascorbic acid 500 MG tablet Commonly known as: VITAMIN C Take 500-1,000 mg by mouth daily.   Calcium Carb-Cholecalciferol 600-800 MG-UNIT Chew Chew 1 tablet by mouth daily.   Fish Oil 1000 MG Caps Take 1 capsule (1,000 mg total) by mouth daily.   folic acid 366 MCG tablet Commonly known as: FOLVITE Take 400 mcg by mouth in the morning and at bedtime.   folic acid 440 MCG tablet Commonly known as: FOLVITE Take 0.5 tablets (400 mcg total) by mouth daily.   gabapentin 300 MG capsule Commonly known as: NEURONTIN Take 300 mg by mouth at bedtime.   gabapentin 100 MG capsule Commonly known as: NEURONTIN Take 1 capsule (100 mg total) by mouth at bedtime.   ibuprofen 600 MG tablet Commonly known as: ADVIL Take 1 tablet (600 mg total) by mouth every 6 (six) hours as needed. What changed: reasons to take this   levothyroxine 175 MCG tablet Commonly known as: SYNTHROID Take 175 mcg by mouth daily before breakfast.   levothyroxine 150 MCG tablet Commonly known as: Synthroid Take 1 tablet (150 mcg total) by mouth daily before breakfast.   levothyroxine 25 MCG tablet Commonly known as: SYNTHROID Take 1 tablet (25 mcg total) by mouth daily before breakfast.   omega-3 acid ethyl esters 1  g capsule Commonly known as: LOVAZA Take 1 g by mouth daily.   oxyCODONE 5 MG immediate release tablet Commonly known as: Oxy IR/ROXICODONE Take 1 tablet (5 mg total) by mouth every 4 (four) hours as needed for severe pain.   predniSONE 5 MG tablet Commonly known as: DELTASONE Take 1 tablet (5 mg total) by mouth daily with breakfast.   silver sulfADIAZINE 1 % cream Commonly known as: SILVADENE Apply 1 application topically 2 (two) times daily. Clean site of wound and apply cream.   Vitamin D-3 25 MCG (1000 UT) Caps Take 1,000 Units by mouth daily.       The results of significant diagnostics from this hospitalization (including imaging, microbiology, ancillary and laboratory) are listed below for reference.    Significant Diagnostic Studies: No results found.  Microbiology: Recent Results (from the past  240 hour(s))  SARS CORONAVIRUS 2 (TAT 6-24 HRS) Nasopharyngeal Nasopharyngeal Swab     Status: None   Collection Time: 11/10/19  7:29 PM   Specimen: Nasopharyngeal Swab  Result Value Ref Range Status   SARS Coronavirus 2 NEGATIVE NEGATIVE Final    Comment: (NOTE) SARS-CoV-2 target nucleic acids are NOT DETECTED. The SARS-CoV-2 RNA is generally detectable in upper and lower respiratory specimens during the acute phase of infection. Negative results do not preclude SARS-CoV-2 infection, do not rule out co-infections with other pathogens, and should not be used as the sole basis for treatment or other patient management decisions. Negative results must be combined with clinical observations, patient history, and epidemiological information. The expected result is Negative. Fact Sheet for Patients: HairSlick.no Fact Sheet for Healthcare Providers: quierodirigir.com This test is not yet approved or cleared by the [place] FDA and  has been authorized for detection and/or diagnosis of SARS-CoV-2 by FDA under an  Emergency Use Authorization (EUA). This EUA will remain  in effect (meaning this test can be used) for the duration of the COVID-19 declaration under Section 56 4(b)(1) of the Act, 21 U.S.C. section 360bbb-3(b)(1), unless the authorization is terminated or revoked sooner. Performed at [facility], [address] [place], [place] [address]      Labs: Basic Metabolic Panel: Recent Labs  Lab 11/10/19 1454 11/11/19 0500  NA 139 139  K 3.9 3.9  CL 108 109  CO2 23 22  GLUCOSE 103* 90  BUN <5* <5*  CREATININE 0.62 0.77  CALCIUM 9.5 9.5   Liver Function Tests: Recent Labs  Lab 11/10/19 1454 11/11/19 0500  AST 65* 66*  ALT 22 23  ALKPHOS 88 79  BILITOT 3.1* 3.2*  PROT 7.5 7.3  ALBUMIN 3.7 3.5   No results for input(s): LIPASE, AMYLASE in the last 168 hours. No results for input(s): AMMONIA in the last 168 hours. CBC: Recent Labs  Lab 11/10/19 1454 11/10/19 1945 11/11/19 0500 11/11/19 1512  WBC 9.4 9.7 9.0 PENDING  NEUTROABS  --  4.1  --   --   HGB 5.1* 4.9* 7.7* 7.4*  HCT 14.9* 14.6* 22.0* 21.4*  MCV 83.7 84.9 83.3 83.9  PLT 201 190 234 221   Cardiac Enzymes: No results for input(s): CKTOTAL, CKMB, CKMBINDEX, TROPONINI in the last 168 hours. BNP: Invalid input(s): POCBNP CBG: No results for input(s): GLUCAP in the last 168 hours.  Time coordinating discharge: 50 minutes  Signed:  Nolon Nations  APRN, MSN, FNP-C Patient Care [facility] [address] [place], [place] [address] [phone]  [facility] 11/11/2019, 3:30 PM

## 2019-11-11 NOTE — ED Notes (Addendum)
Pt endorsing itching to R arm above IV site where blood is infusing. No other symptoms noted. Blood bank called to consult, advised to pause and tx with benadryl. Blood was paused at this time. Only if one more symptom arises will it be worked up as a transfusion rxn. Paged hospitalists.

## 2019-11-11 NOTE — ED Notes (Signed)
Tele   bfast ordered  

## 2019-11-11 NOTE — ED Notes (Signed)
Gave 25 mg IV benadryl per MD Dierdre Searles. Restarted IV blood administration and will sit with patient for another period of time to monitor for further reaction symptoms.

## 2019-11-11 NOTE — ED Notes (Signed)
Pt to be d/c from ED. 3E notified of plan

## 2019-11-11 NOTE — ED Notes (Signed)
Pt ambulated w/o assist around room.

## 2019-11-11 NOTE — ED Notes (Signed)
Pt assisted with ambulation and reported she  Felt dizzy . Pt was returned to her room via [facility]

## 2019-11-12 LAB — FOLATE RBC
Folate, Hemolysate: 537 ng/mL
Folate, RBC: 2366 ng/mL (ref >498)
Hematocrit: 22.7 % — ABNORMAL LOW (ref 34.0–46.6)

## 2019-12-01 ENCOUNTER — Emergency Department (HOSPITAL_COMMUNITY)
Admission: EM | Admit: 2019-12-01 | Discharge: 2019-12-01 | Disposition: A | Discharge status: Home / Self Care | Payer: Medicare HMO | Attending: Emergency Medicine | Admitting: Emergency Medicine

## 2019-12-01 ENCOUNTER — Encounter (HOSPITAL_COMMUNITY): Payer: Self-pay | Admitting: *Deleted

## 2019-12-01 ENCOUNTER — Emergency Department (HOSPITAL_COMMUNITY): Payer: Medicare HMO

## 2019-12-01 ENCOUNTER — Other Ambulatory Visit: Payer: Self-pay

## 2019-12-01 DIAGNOSIS — R1031 Right lower quadrant pain: Secondary | ICD-10-CM | POA: Insufficient documentation

## 2019-12-01 DIAGNOSIS — E039 Hypothyroidism, unspecified: Secondary | ICD-10-CM | POA: Diagnosis not present

## 2019-12-01 DIAGNOSIS — Z79899 Other long term (current) drug therapy: Secondary | ICD-10-CM | POA: Insufficient documentation

## 2019-12-01 DIAGNOSIS — D649 Anemia, unspecified: Secondary | ICD-10-CM | POA: Diagnosis not present

## 2019-12-01 DIAGNOSIS — D571 Sickle-cell disease without crisis: Secondary | ICD-10-CM | POA: Insufficient documentation

## 2019-12-01 DIAGNOSIS — K625 Hemorrhage of anus and rectum: Secondary | ICD-10-CM | POA: Diagnosis present

## 2019-12-01 LAB — CBC
HCT: 20.8 % — ABNORMAL LOW (ref 36.0–46.0)
Hemoglobin: 6.8 g/dL — CL (ref 12.0–15.0)
MCH: 31.5 pg (ref 26.0–34.0)
MCHC: 32.7 g/dL (ref 30.0–36.0)
MCV: 96.3 fL (ref 80.0–100.0)
Platelets: 227 10*3/uL (ref 150–400)
RBC: 2.16 MIL/uL — ABNORMAL LOW (ref 3.87–5.11)
RDW: 24.8 % — ABNORMAL HIGH (ref 11.5–15.5)
WBC: 7.8 10*3/uL (ref 4.0–10.5)
nRBC: 14.7 % — ABNORMAL HIGH (ref 0.0–0.2)

## 2019-12-01 LAB — COMPREHENSIVE METABOLIC PANEL
ALT: 26 U/L (ref 0–44)
AST: 105 U/L — ABNORMAL HIGH (ref 15–41)
Albumin: 3.8 g/dL (ref 3.5–5.0)
Alkaline Phosphatase: 86 U/L (ref 38–126)
Anion gap: 9 (ref 5–15)
BUN: <5 mg/dL — ABNORMAL LOW (ref 6–20)
CO2: 23 mmol/L (ref 22–32)
Calcium: 9.4 mg/dL (ref 8.9–10.3)
Chloride: 107 mmol/L (ref 98–111)
Creatinine, Ser: 0.73 mg/dL (ref 0.44–1.00)
GFR calc Af Amer: >60 mL/min (ref >60)
GFR calc non Af Amer: >60 mL/min (ref >60)
Glucose, Bld: 89 mg/dL (ref 70–99)
Potassium: 3.8 mmol/L (ref 3.5–5.1)
Sodium: 139 mmol/L (ref 135–145)
Total Bilirubin: 4.6 mg/dL — ABNORMAL HIGH (ref 0.3–1.2)
Total Protein: 8 g/dL (ref 6.5–8.1)

## 2019-12-01 LAB — POC OCCULT BLOOD, ED: Fecal Occult Bld: NEGATIVE

## 2019-12-01 LAB — PREPARE RBC (CROSSMATCH)

## 2019-12-01 MED ORDER — IOHEXOL 350 MG/ML SOLN
100.0000 mL | Freq: Once | INTRAVENOUS | Status: AC | PRN
  Administered 2019-12-01: 100 mL via INTRAVENOUS

## 2019-12-01 MED ORDER — SODIUM CHLORIDE 0.9 % IV SOLN: 10.0000 mL/h | Freq: Once | INTRAVENOUS | Status: DC

## 2019-12-01 MED ORDER — HYDROMORPHONE HCL 1 MG/ML IJ SOLN
0.5000 mg | Freq: Once | INTRAMUSCULAR | Status: AC
  Administered 2019-12-01: 0.5 mg via INTRAVENOUS
  Filled 2019-12-01: qty 1

## 2019-12-01 MED ORDER — DIPHENHYDRAMINE HCL 50 MG/ML IJ SOLN
25.0000 mg | Freq: Once | INTRAMUSCULAR | Status: AC
  Administered 2019-12-01: 25 mg via INTRAVENOUS
  Filled 2019-12-01: qty 1

## 2019-12-01 MED ORDER — OXYCODONE-ACETAMINOPHEN 5-325 MG PO TABS: 1.0000 | ORAL_TABLET | Freq: Four times a day (QID) | ORAL | 0 refills | Status: AC | PRN

## 2019-12-01 NOTE — ED Notes (Signed)
ED Provider at bedside. 

## 2019-12-01 NOTE — Discharge Instructions (Addendum)
Please go to the sickle cell clinic tomorrow.  First thing tomorrow morning, call their office to confirm appointment time.  If you feel you have worsening pain, any further blood per rectum, or other new concerning symptom, return to ER for reassessment.  Take prescribed Percocet as needed.  Note this can make you drowsy and should not be taken while driving or operating heavy machinery.   Please follow up with GI regarding the blood you noticed in the stool.

## 2019-12-01 NOTE — ED Triage Notes (Signed)
Sent to ED due to 'low blood'. States she has RLQ pain and bright red blood in stool. States she was in ED 2 wks ago and was transfused 2 units. Skin w/d, resp e/u. Appears in nad. Sclera yellow.

## 2019-12-01 NOTE — ED Provider Notes (Signed)
[facility] EMERGENCY DEPARTMENT Provider Note   CSN: [id number] Arrival date & time: 12/01/19  0277     History Chief Complaint  Patient presents with  . Abdominal Pain  . GI Bleeding    [name] is a 59 y.o. female.  HPI 59 year old female presents with low hemoglobin and rectal bleeding.  She is a vague historian but overall it sounds like she went to her PCP yesterday and had regular labs drawn and was told her hemoglobin was in the 6 range and was told to come to the ER.  Had a blood transfusion at the end of April where her hemoglobin was down to 4.  She states over the last week or 2 she has been having blood when she wipes after bowel movements.  Does not think there is blood in the stool.  It is bright red.  She states she has a history of hemorrhoids.  Has also been having right lower quadrant abdominal pain during this time.   Past Medical History:  Diagnosis Date  . Aneurysm (HCC)   . Anxiety   . Arthritis   . Colon ulcer   . Hypokalemia   . Hypothyroidism   . Pneumonia   . Sickle cell anemia (HCC)   . Ulcers of both lower extremities [facility])     Patient Active Problem List   Diagnosis Date Noted  . Acute on chronic anemia 11/10/2019  . Sickle cell anemia with crisis (HCC) 11/19/2017  . Symptomatic anemia 02/06/2017  . Leg wound, right 02/06/2017  . Sickle cell crisis (HCC) 10/24/2015  . Anemia of chronic disease   . Hepatitis C 05/11/2014  . 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 Thrombophlebitis leg superficial 03/04/2013  . Rheumatoid arthritis (HCC) 12/16/2012  . Hypothyroidism 11/27/2011    Past Surgical History:  Procedure Laterality Date  . ANKLE SURGERY  1989 and 1990  . CEREBRAL ANEURYSM REPAIR    . COLONOSCOPY  05/12/2012   Procedure: COLONOSCOPY;  Surgeon: Hart Carwin, MD;  Location: [facility] ENDOSCOPY;  Service: Endoscopy;  Laterality: N/A;  . ESOPHAGOGASTRODUODENOSCOPY  05/12/2012   Procedure:  ESOPHAGOGASTRODUODENOSCOPY (EGD);  Surgeon: Hart Carwin, MD;  Location: [facility];  Service: Endoscopy;  Laterality: N/A;  . GIVENS CAPSULE STUDY  05/13/2012   Procedure: GIVENS CAPSULE STUDY;  Surgeon: Hart Carwin, MD;  Location: [facility] ENDOSCOPY;  Service: Endoscopy;  Laterality: N/A;     OB History   No obstetric history on file.     Family History  Problem Relation Age of Onset  . Cancer Mother 80       Esophageal  . Sickle cell trait Mother   . Sickle cell trait Father   . HIV/AIDS Brother     Social History   Tobacco Use  . Smoking status: Never Smoker  . Smokeless tobacco: Never Used  Substance Use Topics  . Alcohol use: No  . Drug use: No    Home Medications Prior to Admission medications   Medication Sig Start Date End Date Taking? Authorizing Provider  acetaminophen (TYLENOL) 650 MG CR tablet Take 650 mg by mouth at bedtime as needed for pain.   Yes [provider]  ascorbic acid (VITAMIN C) 500 MG tablet Take 500-1,000 mg by mouth daily.   Yes [provider]  Cholecalciferol (VITAMIN D-3) 25 MCG (1000 UT) CAPS Take 1,000 Units by mouth daily.   Yes [provider]  folic acid (FOLVITE) 400 MCG tablet Take 400  mcg by mouth in the morning and at bedtime.   Yes [provider]  gabapentin (NEURONTIN) 300 MG capsule Take 300 mg by mouth at bedtime.   Yes [provider]  levothyroxine (SYNTHROID) 175 MCG tablet Take 175 mcg by mouth daily before breakfast.   Yes [provider]  montelukast (SINGULAIR) 10 MG tablet Take 1 tablet by mouth every evening. 11/25/19  Yes [provider]  omega-3 acid ethyl esters (LOVAZA) 1 g capsule Take 1 g by mouth daily.   Yes [provider]  Calcium Carb-Cholecalciferol 600-800 MG-UNIT CHEW Chew 1 tablet by mouth daily. Patient not taking: Reported on 11/10/2019 02/14/17   Bing Neighbors, FNP  folic acid (FOLVITE) 800 MCG tablet Take 0.5 tablets (400 mcg  total) by mouth daily. Patient not taking: Reported on 11/10/2019 08/06/18   Kallie Locks, FNP  gabapentin (NEURONTIN) 100 MG capsule Take 1 capsule (100 mg total) by mouth at bedtime. Patient not taking: Reported on 11/10/2019 08/06/18   Kallie Locks, FNP  ibuprofen (ADVIL,MOTRIN) 600 MG tablet Take 1 tablet (600 mg total) by mouth every 6 (six) hours as needed. Patient not taking: Reported on 12/01/2019 08/06/18   Kallie Locks, FNP  levothyroxine (SYNTHROID) 150 MCG tablet Take 1 tablet (150 mcg total) by mouth daily before breakfast. Patient not taking: Reported on 11/10/2019 08/06/18   Kallie Locks, FNP  levothyroxine (SYNTHROID, LEVOTHROID) 25 MCG tablet Take 1 tablet (25 mcg total) by mouth daily before breakfast. Patient not taking: Reported on 11/10/2019 08/06/18   Kallie Locks, FNP  Omega-3 Fatty Acids (FISH OIL) 1000 MG CAPS Take 1 capsule (1,000 mg total) by mouth daily. Patient not taking: Reported on 11/10/2019 08/06/18   Kallie Locks, FNP  oxyCODONE (OXY IR/ROXICODONE) 5 MG immediate release tablet Take 1 tablet (5 mg total) by mouth every 4 (four) hours as needed for severe pain. Patient not taking: Reported on 11/10/2019 11/18/17   Massie Maroon, FNP  predniSONE (DELTASONE) 5 MG tablet Take 1 tablet (5 mg total) by mouth daily with breakfast. Patient not taking: Reported on 11/10/2019 08/06/18   Kallie Locks, FNP  silver sulfADIAZINE (SILVADENE) 1 % cream Apply 1 application topically 2 (two) times daily. Clean site of wound and apply cream. Patient not taking: Reported on 11/10/2019 08/23/17   Bing Neighbors, FNP    Allergies    Ampicillin and Demerol [meperidine]  Review of Systems   Review of Systems  Constitutional: Negative for fatigue.  Respiratory: Negative for shortness of breath.   Cardiovascular: Negative for chest pain.  Gastrointestinal: Positive for abdominal pain and blood in stool. Negative for rectal pain and vomiting.  All other  systems reviewed and are negative.   Physical Exam Updated Vital Signs BP 126/76   Pulse 65   Temp 98.4 F (36.9 C) (Oral)   Resp 14   Ht 5\' 11"  (1.803 m)   Wt 65.8 kg   SpO2 98%   BMI 20.22 kg/m   Physical Exam Vitals and nursing note reviewed. Exam conducted with a chaperone present.  Constitutional:      General: She is not in acute distress.    Appearance: She is well-developed. She is not ill-appearing or diaphoretic.  HENT:     Head: Normocephalic and atraumatic.     Right Ear: External ear normal.     Left Ear: External ear normal.     Nose: Nose normal.  Eyes:  General:        Right eye: No discharge.        Left eye: No discharge.  Cardiovascular:     Rate and Rhythm: Normal rate and regular rhythm.     Heart sounds: Normal heart sounds.  Pulmonary:     Effort: Pulmonary effort is normal.     Breath sounds: Normal breath sounds.  Abdominal:     Palpations: Abdomen is soft.     Tenderness: There is abdominal tenderness (mild) in the right lower quadrant.  Genitourinary:    Rectum: Guaiac result negative. No mass, tenderness or anal fissure. Normal anal tone.  Skin:    General: Skin is warm and dry.  Neurological:     Mental Status: She is alert.  Psychiatric:        Mood and Affect: Mood is not anxious.     ED Results / Procedures / Treatments   Labs (all labs ordered are listed, but only abnormal results are displayed) Labs Reviewed  COMPREHENSIVE METABOLIC PANEL - Abnormal; Notable for the following components:      Result Value   BUN <5 (*)    AST 105 (*)    Total Bilirubin 4.6 (*)    All other components within normal limits  CBC - Abnormal; Notable for the following components:   RBC 2.16 (*)    Hemoglobin 6.8 (*)    HCT 20.8 (*)    RDW 24.8 (*)    nRBC 14.7 (*)    All other components within normal limits  POC OCCULT BLOOD, ED  TYPE AND SCREEN  PREPARE RBC (CROSSMATCH)    EKG None  Radiology No results  found.  Procedures .Critical Care Performed by: Pricilla Loveless, MD Authorized by: Pricilla Loveless, MD   Critical care provider statement:    Critical care time (minutes):  30   Critical care time was exclusive of:  Separately billable procedures and treating other patients   Critical care was necessary to treat or prevent imminent or life-threatening deterioration of the following conditions:  Circulatory failure   Critical care was time spent personally by me on the following activities:  Discussions with consultants, evaluation of patient's response to treatment, examination of patient, ordering and performing treatments and interventions, ordering and review of laboratory studies, ordering and review of radiographic studies, pulse oximetry, re-evaluation of patient's condition, obtaining history from patient or surrogate and review of old charts   (including critical care time)  Medications Ordered in ED Medications  0.9 %  sodium chloride infusion (has no administration in time range)  iohexol (OMNIPAQUE) 350 MG/ML injection 100 mL (100 mLs Intravenous Contrast Given 12/01/19 1522)    ED Course  I have reviewed the triage vital signs and the nursing notes.  Pertinent labs & imaging results that were available during my care of the patient were reviewed by me and considered in my medical decision making (see chart for details).    MDM Rules/Calculators/A&P                      Hemoglobin is lower than her baseline at 6.8.  She is typically 7 or higher.  Given this in the setting of having on and off bleeding from her rectum, she will be given a unit of RBCs.  She also will get a CT angiography given the abdominal pain association with some bleeding.  While she is not unstable, I think she should probably come in for GI work-up.  Care transferred to Dr. Roslynn Amble with CT pending. Final Clinical Impression(s) / ED Diagnoses Final diagnoses:  Acute on chronic anemia    Rx / DC  Orders ED Discharge Orders    None       Sherwood Gambler, MD 12/01/19 1531

## 2019-12-01 NOTE — ED Provider Notes (Signed)
Signout note  59 year old lady sickle cell disease presenting to ER with concern for low hemoglobin, bright red blood in stool.  Here hemoglobin 6.8, slightly lower than her baseline which is around 7.  CT abdomen pelvis ordered to further evaluate her reported bright red blood per rectum and abdominal pain.  Hemoccult was negative.   Received signout from Dr. Gwenlyn Fudge   Reviewed CT scan, no acute findings, reviewed case with on-call for [facility] - if Hgb near baseline, CT neg, heme neg and no ongoing bloody stools, okay for out pt f/u  Discussed case with Odette Horns, NP for sickle cell clinic, she is very familiar with patient, cared for her on last admission; if pain well controlled and patient remains well appearing, recommends discharge and recheck in their clinic tomorrow, will repeat Hgb in clinic  Updated patient, well appearing, pain well controlled, provided short rx for percocet for pain control at home and stressed need for recheck in sickle cell clinic tomorrow   Milagros Loll, MD 12/01/19 1902

## 2019-12-02 LAB — TYPE AND SCREEN
ABO/RH(D): B POS
Antibody Screen: NEGATIVE
Unit division: 0

## 2019-12-02 LAB — BPAM RBC
Blood Product Expiration Date: 202105282359
ISSUE DATE / TIME: 202105111357
Unit Type and Rh: 5100

## 2019-12-15 ENCOUNTER — Telehealth: Payer: Self-pay | Admitting: Pharmacy Technician

## 2019-12-15 ENCOUNTER — Other Ambulatory Visit: Payer: Self-pay

## 2019-12-15 ENCOUNTER — Encounter: Payer: Self-pay | Admitting: Family

## 2019-12-15 ENCOUNTER — Ambulatory Visit (INDEPENDENT_AMBULATORY_CARE_PROVIDER_SITE_OTHER): Payer: Medicare HMO | Admitting: Family

## 2019-12-15 VITALS — BP 138/77 | HR 58 | Wt 150.0 lb

## 2019-12-15 DIAGNOSIS — B182 Chronic viral hepatitis C: Secondary | ICD-10-CM

## 2019-12-15 NOTE — Patient Instructions (Signed)
Nice to meet you.  We will check your blood work today and let you know the results.   Let us know if you have any questions.  Have a great day and stay safe!  Limit acetaminophen (Tylenol) usage to no more than 2 grams (2,000 mg) per day.  Avoid alcohol.  Do not share toothbrushes or razors.  Practice safe sex to protect against transmission as well as sexually transmitted disease.    Hepatitis C Hepatitis C is a viral infection of the liver. It can lead to scarring of the liver (cirrhosis), liver failure, or liver cancer. Hepatitis C may go undetected for months or years because people with the infection may not have symptoms, or they may have only mild symptoms. What are the causes? This condition is caused by the hepatitis C virus (HCV). The virus can spread from person to person (is contagious) through:  Blood.  Childbirth. A woman who has hepatitis C can pass it to her baby during birth.  Bodily fluids, such as breast milk, tears, semen, vaginal fluids, and saliva.  Blood transfusions or organ transplants done in the [place] before 1992.  What increases the risk? The following factors may make you more likely to develop this condition:  Having contact with unclean (contaminated) needles or syringes. This may result from: ? Acupuncture. ? Tattoing. ? Body piercing. ? Injecting drugs.  Having unprotected sex with someone who is infected.  Needing treatment to filter your blood (kidney dialysis).  Having HIV (human immunodeficiency virus) or AIDS (acquired immunodeficiency syndrome).  Working in a job that involves contact with blood or bodily fluids, such as health care.  What are the signs or symptoms? Symptoms of this condition include:  Fatigue.  Loss of appetite.  Nausea.  Vomiting.  Abdominal pain.  Dark yellow urine.  Yellowish skin and eyes (jaundice).  Itchy skin.  Clay-colored bowel movements.  Joint pain.  Bleeding and bruising  easily.  Fluid building up in your stomach (ascites).  In some cases, you may not have any symptoms. How is this diagnosed? This condition is diagnosed with:  Blood tests.  Other tests to check how well your liver is functioning. They may include: ? Magnetic resonance elastography (MRE). This imaging test uses MRIs and sound waves to measure liver stiffness. ? Transient elastography. This imaging test uses ultrasounds to measure liver stiffness. ? Liver biopsy. This test requires taking a small tissue sample from your liver to examine it under a microscope.  How is this treated? Your health care provider may perform noninvasive tests or a liver biopsy to help decide the best course of treatment. Treatment may include:  Antiviral medicines and other medicines.  Follow-up treatments every 6-12 months for infections or other liver conditions.  Receiving a donated liver (liver transplant).  Follow these instructions at home: Medicines  Take over-the-counter and prescription medicines only as told by your health care provider.  Take your antiviral medicine as told by your health care provider. Do not stop taking the antiviral even if you start to feel better.  Do not take any medicines unless approved by your health care provider, including over-the-counter medicines and birth control pills. Activity  Rest as needed.  Do not have sex unless approved by your health care provider.  Ask your health care provider when you may return to school or work. Eating and drinking  Eat a balanced diet with plenty of fruits and vegetables, whole grains, and lowfat (lean) meats or non-meat proteins (  such as beans or tofu).  Drink enough fluids to keep your urine clear or pale yellow.  Do not drink alcohol. General instructions  Do not share toothbrushes, nail clippers, or razors.  Wash your hands frequently with soap and water. If soap and water are not available, use hand  sanitizer.  Cover any cuts or open sores on your skin to prevent spreading the virus.  Keep all follow-up visits as told by your health care provider. This is important. You may need follow-up visits every 6-12 months. How is this prevented? There is no vaccine for hepatitis C. The only way to prevent the disease is to reduce the risk of exposure to the virus. Make sure you:  Wash your hands frequently with soap and water. If soap and water are not available, use hand sanitizer.  Do not share needles or syringes.  Practice safe sex and use condoms.  Avoid handling blood or bodily fluids without gloves or other protection.  Avoid getting tattoos or piercings in shops or other locations that are not clean.  Contact a health care provider if:  You have a fever.  You develop abdominal pain.  You pass dark urine.  You pass clay-colored stools.  You develop joint pain. Get help right away if:  You have increasing fatigue or weakness.  You lose your appetite.  You cannot eat or drink without vomiting.  You develop jaundice or your jaundice gets worse.  You bruise or bleed easily. Summary  Hepatitis C is a viral infection of the liver. It can lead to scarring of the liver (cirrhosis), liver failure, or liver cancer.  The hepatitis C virus (HCV) causes this condition. The virus can pass from person to person (is contagious).  You should not take any medicines unless approved by your health care provider. This includes over-the-counter medicines and birth control pills. This information is not intended to replace advice given to you by your health care provider. Make sure you discuss any questions you have with your health care provider. Document Released: 07/06/2000 Document Revised: 08/14/2016 Document Reviewed: 08/14/2016 Elsevier Interactive Patient Education  [employer].

## 2019-12-15 NOTE — Progress Notes (Signed)
Subjective:    Patient ID: [name], female    DOB: April 06, 1961, 59 y.o.   MRN: [id number]  Chief Complaint  Patient presents with  . New Patient (Initial Visit)    New HCV patient; has not received COVID vaccine; will provide counseling services info    HPI:  [name] is a 59 y.o. 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  [name] was recently seen by her primary care provider on 10/08/2019 with blood work showing positive hepatitis C antibody and hepatitis C viral load of 10,700.  First diagnosed with Hepatitis C in 2012 during a routine screening and was asymptomatic at the time. Risk factors include age and a blood transfusion in 1990.  Denies history of tattoos, sharing of toothbrushes/razors, or sexual contact with known positive partner.  Currently with right upper abdominal pain described as sharp at times and generally waxing and waning.  No other nausea, vomiting, or jaundice.  Has had scleral icterus before.  No personal or family history of liver disease.  Has not received treatment since initial diagnosis.  No current recreational or illicit drug use or tobacco use and occasional alcohol consumption.     Allergies  Allergen Reactions  . Ampicillin Other (See Comments)    Caused red bumps on the skin, but nothing else Per patient, she tolerates cephalosporins without problems (??)  . Demerol [Meperidine] Other (See Comments)    A high(er) dose caused seizure(s)      Outpatient Medications Prior to Visit  Medication Sig Dispense Refill  . acetaminophen (TYLENOL) 650 MG CR tablet Take 650 mg by mouth at bedtime as needed for pain.    Marland Kitchen ascorbic acid (VITAMIN C) 500 MG tablet Take 500-1,000 mg by mouth  daily.    . Calcium Carb-Cholecalciferol 600-800 MG-UNIT CHEW Chew 1 tablet by mouth daily. (Patient not taking: Reported on 11/10/2019) 60 tablet 11  . Cholecalciferol (VITAMIN D-3) 25 MCG (1000 UT) CAPS Take 1,000 Units by mouth daily.    . folic acid (FOLVITE) 789 MCG tablet Take 400 mcg by mouth in the morning and at bedtime.    . folic acid (FOLVITE) 381 MCG tablet Take 0.5 tablets (400 mcg total) by mouth daily. (Patient not taking: Reported on 11/10/2019) 60 tablet 4  . gabapentin (NEURONTIN) 100 MG capsule Take 1 capsule (100 mg total) by mouth at bedtime. (Patient not taking: Reported on 11/10/2019) 30 capsule 6  . gabapentin (NEURONTIN) 300 MG capsule Take 300 mg by mouth at bedtime.    Marland Kitchen ibuprofen (ADVIL,MOTRIN) 600 MG tablet Take 1 tablet (600 mg total) by mouth every 6 (six) hours as needed. (Patient not taking: Reported on 12/01/2019) 30 tablet 6  . levothyroxine (SYNTHROID) 150 MCG tablet Take 1 tablet (150 mcg total) by mouth daily before breakfast. (Patient not taking: Reported on 11/10/2019) 90 tablet 2  . levothyroxine (SYNTHROID) 175 MCG tablet Take 175 mcg by mouth daily before breakfast.    . levothyroxine (SYNTHROID, LEVOTHROID) 25 MCG tablet Take 1 tablet (25 mcg total) by mouth daily before breakfast. (Patient not taking: Reported on 11/10/2019) 90 tablet 2  . montelukast (SINGULAIR) 10 MG tablet Take 1 tablet by mouth every evening.    Marland Kitchen omega-3 acid ethyl esters (LOVAZA) 1 g capsule Take 1 g by  mouth daily.    . Omega-3 Fatty Acids (FISH OIL) 1000 MG CAPS Take 1 capsule (1,000 mg total) by mouth daily. (Patient not taking: Reported on 11/10/2019) 30 capsule 6  . oxyCODONE (OXY IR/ROXICODONE) 5 MG immediate release tablet Take 1 tablet (5 mg total) by mouth every 4 (four) hours as needed for severe pain. (Patient not taking: Reported on 11/10/2019) 20 tablet 0  . silver sulfADIAZINE (SILVADENE) 1 % cream Apply 1 application topically 2 (two) times daily. Clean site of wound and apply  cream. (Patient not taking: Reported on 11/10/2019) 50 g 0  . predniSONE (DELTASONE) 5 MG tablet Take 1 tablet (5 mg total) by mouth daily with breakfast. (Patient not taking: Reported on 11/10/2019) 30 tablet 0   No facility-administered medications prior to visit.     Past Medical History:  Diagnosis Date  . Aneurysm ([place])   . Anxiety   . Arthritis   . Colon ulcer   . Hypokalemia   . Hypothyroidism   . Pneumonia   . Sickle cell anemia (HCC)   . Ulcers of both lower extremities [facility])       Past Surgical History:  Procedure Laterality Date  . [place]  . CEREBRAL ANEURYSM REPAIR    . COLONOSCOPY  05/12/2012   Procedure: COLONOSCOPY;  Surgeon: Lafayette Dragon, MD;  Location: [facility];  Service: Endoscopy;  Laterality: N/A;  . ESOPHAGOGASTRODUODENOSCOPY  05/12/2012   Procedure: ESOPHAGOGASTRODUODENOSCOPY (EGD);  Surgeon: Lafayette Dragon, MD;  Location: [facility];  Service: Endoscopy;  Laterality: N/A;  . GIVENS CAPSULE STUDY  05/13/2012   Procedure: GIVENS CAPSULE STUDY;  Surgeon: Lafayette Dragon, MD;  [facility];  Service: Endoscopy;  Laterality: N/A;      Family History  Problem Relation Age of Onset  . Cancer Mother 21       Esophageal  . Sickle cell trait Mother   . Sickle cell trait Father   . HIV/AIDS Brother       Social History   Socioeconomic History  . Marital status: Single    Spouse name: Not on file  . Number of children: Not on file  . Years of education: Not on file  . Highest education level: Not on file  Occupational History  . Not on file  Tobacco Use  . Smoking status: Never Smoker  . Smokeless tobacco: Never Used  Substance and Sexual Activity  . Alcohol use: Yes    Comment: Occasional  . Drug use: No  . Sexual activity: Not Currently  Other Topics Concern  . Not on file  Social History Narrative  . Not on file   Social Determinants of Health   Financial Resource Strain:   . Difficulty of Paying  Living Expenses:   Food Insecurity:   . Worried About Charity fundraiser in the Last Year:   . Arboriculturist in the Last Year:   Transportation Needs:   . Film/video editor (Medical):   Marland Kitchen Lack of Transportation (Non-Medical):   Physical Activity:   . Days of Exercise per Week:   . Minutes of Exercise per Session:   Stress:   . Feeling of Stress :   Social Connections:   . Frequency of Communication with Friends and Family:   . Frequency of Social Gatherings with Friends and Family:   . Attends Religious Services:   . Active Member of Clubs or Organizations:   . Attends Club or  Organization Meetings:   Marland Kitchen Marital Status:   Intimate Partner Violence:   . Fear of Current or Ex-Partner:   . Emotionally Abused:   Marland Kitchen Physically Abused:   . Sexually Abused:       Review of Systems  Constitutional: Negative for chills, fatigue, fever and unexpected weight change.  Respiratory: Negative for cough, chest tightness, shortness of breath and wheezing.   Cardiovascular: Negative for chest pain and leg swelling.  Gastrointestinal: Negative for abdominal distention, constipation, diarrhea, nausea and vomiting.  Neurological: Negative for dizziness, weakness, light-headedness and headaches.  Hematological: Does not bruise/bleed easily.       Objective:    BP 138/77   Pulse (!) 58   Wt 150 lb (68 kg)   BMI 20.92 kg/m  Nursing note and vital signs reviewed.  Physical Exam Constitutional:      General: She is not in acute distress.    Appearance: She is well-developed.  Cardiovascular:     Rate and Rhythm: Normal rate and regular rhythm.     Heart sounds: Normal heart sounds. No murmur. No friction rub. No gallop.   Pulmonary:     Effort: Pulmonary effort is normal. No respiratory distress.     Breath sounds: Normal breath sounds. No wheezing or rales.  Chest:     Chest wall: No tenderness.  Abdominal:     General: Bowel sounds are normal. There is no distension.      Palpations: Abdomen is soft. There is no mass.     Tenderness: There is no abdominal tenderness. There is no guarding or rebound.  Skin:    General: Skin is warm and dry.  Neurological:     Mental Status: She is alert and oriented to person, place, and time.  Psychiatric:        Behavior: Behavior normal.        Thought Content: Thought content normal.        Judgment: Judgment normal.        Assessment & Plan:   Patient Active Problem List   Diagnosis Date Noted  . Acute on chronic anemia 11/10/2019  . Sickle cell anemia with crisis ([place]) 11/19/2017  . Symptomatic anemia 02/06/2017  . Leg wound, right 02/06/2017  . Sickle cell crisis ([place]) 10/24/2015  . Anemia of chronic disease   . Hepatitis C 05/11/2014  . 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 Thrombophlebitis leg superficial 03/04/2013  . Rheumatoid arthritis ([place]) 12/16/2012  . Hypothyroidism 11/27/2011     Problem List Items Addressed This Visit      Digestive   Hepatitis C - Primary    [name] is a 59 year old female with chronic hepatitis C with initial viral load of 10,700.  Infection likely obtained from previous history of blood transfusion and/or through medical practice/age.  She is treatment nave with occasional abdominal pains.  Discussed the pathogenesis, transmission, prevention, risks if left untreated, and treatment options for hepatitis C.  She met with pharmacy staff to complete medication assistance paperwork.  Check blood work today including hepatitis C genotype, fibrosis score, hepatic function, HIV status, and hepatitis B status.  Plan for medication decision pending blood work results.      Relevant Orders   Hepatic function panel   Hepatitis C genotype   Hepatitis C RNA quantitative   Liver Fibrosis, FibroTest-ActiTest   Protime-INR   Hepatitis B surface antibody,qualitative   Hepatitis B surface antigen   HIV antibody (with reflex)  I have discontinued Janyth Faulkenberry's  predniSONE. I am also having her maintain her Calcium Carb-Cholecalciferol, silver sulfADIAZINE, oxyCODONE, folic acid, gabapentin, ibuprofen, levothyroxine, levothyroxine, Fish Oil, omega-3 acid ethyl esters, levothyroxine, folic acid, acetaminophen, Vitamin D-3, ascorbic acid, gabapentin, and montelukast.   Follow-up: Return Pending blood work results and 1 month after initiation of medication.    Terri Piedra, MSN, FNP-C Nurse Practitioner [facility] [place] number: [phone]

## 2019-12-15 NOTE — Assessment & Plan Note (Signed)
[name] is a 58-year-old female with chronic hepatitis C with initial viral load of 10,700.  Infection likely obtained from previous history of blood transfusion and/or through medical practice/age.  She is treatment nave with occasional abdominal pains.  Discussed the pathogenesis, transmission, prevention, risks if left untreated, and treatment options for hepatitis C.  She met with pharmacy staff to complete medication assistance paperwork.  Check blood work today including hepatitis C genotype, fibrosis score, hepatic function, HIV status, and hepatitis B status.  Plan for medication decision pending blood work results. 

## 2019-12-15 NOTE — Telephone Encounter (Signed)
[facility] Patient Advocate Encounter   Gathered needed copay information to submit to bring cost down to zero for Hepatitis C medication.  Patient would like medication shipped to her home.  We have hard copy HCV labs to submit.     [facility] will continue to follow.   Netty Starring. Dimas Aguas CPhT Specialty Pharmacy Patient [facility] Phone: [phone] Fax:  [phone]

## 2019-12-15 NOTE — Telephone Encounter (Signed)
[facility] Patient Advocate Encounter  Insurance verification completed.    The patient is insured through [employer].  We will continue to follow to see if copay assistance is needed.  Casmira Cramer E. Money Mckeithan, CPhT Specialty Pharmacy Patient [facility] Phone: [phone] Fax:  [phone]   

## 2019-12-25 ENCOUNTER — Encounter: Payer: Self-pay | Admitting: Family

## 2019-12-25 NOTE — Progress Notes (Signed)
Office Visit Note  Patient: [name]             Date of Birth: Jan 01, 1961           MRN: [id number]             PCP: Sonia Side., FNP Referring: Sonia Side., FNP Visit Date: 01/07/2020 Occupation: '@GUAROCC' @  Subjective:  Pain in left hand.   History of Present Illness: [name] is a 59 y.o. female seen in consultation per request of her PCP for evaluation of joint swelling and joint pain.  According to patient in 90s she started having swelling in her left middle finger.  She left with those symptoms for a long time.  She states in 2007 she was diagnosed with hepatitis C but never received any treatment.  In 2013 she moved to [facility] and she was diagnosed with rheumatoid arthritis.  She was referred to Dr. Earnest Conroy in [facility] and she was placed on prednisone.  She states she was on prednisone for a long time which was eventually taken off by the provider.  She was referred to Dr. Earnest Conroy and Dr. Earnest Conroy started her on Remicade infusions.  She is believes that she took Remicade infusion for 2 years she is uncertain if they were helpful.  She states they were not covered by Medicaid and she discontinued the medication.  She has been off Medicaid for 5 years.  She states she continues to have pain and discomfort in her left hand.  None of the other joints are painful.  There is no family history of rheumatoid arthritis.  She was recently seen for the evaluation of hepatitis C.  She states she has a follow-up appointment coming up for the treatment of hepatitis C.  Activities of Daily Living:  Patient reports morning stiffness for 0 none.   Patient Reports nocturnal pain.  Difficulty dressing/grooming: Denies Difficulty climbing stairs: Denies Difficulty getting out of chair: Denies Difficulty using hands for taps, buttons, cutlery, and/or writing: Reports  Review of Systems  Constitutional: Positive for fatigue. Negative for night sweats, weight gain and weight loss.  HENT: Positive  for mouth dryness. Negative for mouth sores, trouble swallowing, trouble swallowing and nose dryness.   Eyes: Positive for dryness. Negative for pain, redness and visual disturbance.  Respiratory: Negative for cough, shortness of breath and difficulty breathing.   Cardiovascular: Negative for chest pain, palpitations, hypertension, irregular heartbeat and swelling in legs/feet.  Gastrointestinal: Negative for blood in stool, constipation and diarrhea.  Endocrine: Positive for cold intolerance. Negative for heat intolerance, excessive thirst and increased urination.  Genitourinary: Negative for difficulty urinating and vaginal dryness.  Musculoskeletal: Positive for arthralgias, joint pain, joint swelling and muscle tenderness. Negative for gait problem, myalgias, muscle weakness, morning stiffness and myalgias.  Skin: Negative for color change, rash, hair loss, skin tightness, ulcers and sensitivity to sunlight.  Allergic/Immunologic: Negative for susceptible to infections.  Neurological: Positive for numbness and weakness. Negative for dizziness, memory loss and night sweats.  Hematological: Negative for bruising/bleeding tendency and swollen glands.  Psychiatric/Behavioral: Positive for depressed mood. Negative for sleep disturbance. The patient is not nervous/anxious.     PMFS History:  Patient Active Problem List   Diagnosis Date Noted  . Acute on chronic anemia 11/10/2019  . Sickle cell anemia with crisis ([place]) 11/19/2017  . Symptomatic anemia 02/06/2017  . Leg wound, right 02/06/2017  . Sickle cell crisis ([place]) 10/24/2015  . Anemia of chronic disease   .  Hepatitis C 05/11/2014  . 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 Thrombophlebitis leg superficial 03/04/2013  . Rheumatoid arthritis ([place]) 12/16/2012  . Hypothyroidism 11/27/2011    Past Medical History:  Diagnosis Date  . Aneurysm ([place])   . Anxiety   . Arthritis   . Colon ulcer   . Gall stones   . Hypokalemia     . Hypothyroidism   . Pneumonia   . Sickle cell anemia (HCC)   . Ulcers of both lower extremities (HCC)     Family History  Problem Relation Age of Onset  . Cancer Mother 48       Esophageal  . Sickle cell trait Mother   . Sickle cell trait Father   . HIV/AIDS Brother    Past Surgical History:  Procedure Laterality Date  . [place]  . CEREBRAL ANEURYSM REPAIR    . COLONOSCOPY  05/12/2012   Procedure: COLONOSCOPY;  Surgeon: Lafayette Dragon, MD;  [facility];  Service: Endoscopy;  Laterality: N/A;  . ESOPHAGOGASTRODUODENOSCOPY  05/12/2012   Procedure: ESOPHAGOGASTRODUODENOSCOPY (EGD);  Surgeon: Lafayette Dragon, MD;  Location: [facility];  Service: Endoscopy;  Laterality: N/A;  . gall stone removal    . GIVENS CAPSULE STUDY  05/13/2012   Procedure: GIVENS CAPSULE STUDY;  Surgeon: Lafayette Dragon, MD;  [facility];  Service: Endoscopy;  Laterality: N/A;   Social History   Social History Narrative  . Not on file   Immunization History  Administered Date(s) Administered  . Hepatitis B 05/18/2015  . Influenza-Unspecified 04/24/2007  . Meningococcal Conjugate 01/09/2007  . Pneumococcal Conjugate-13 05/18/2015  . Pneumococcal Polysaccharide-23 05/11/2014  . Tdap 09/24/2017     Objective: Vital Signs: BP (!) 115/56 (BP Location: Right Arm, Patient Position: Sitting, Cuff Size: Normal)   Pulse 67   Resp 14   Ht '5\' 11"'  (1.803 m)   Wt 151 lb (68.5 kg)   BMI 21.06 kg/m    Physical Exam Vitals and nursing note reviewed.  Constitutional:      Appearance: She is well-developed.  HENT:     Head: Normocephalic and atraumatic.  Eyes:     Conjunctiva/sclera: Conjunctivae normal.  Cardiovascular:     Rate and Rhythm: Normal rate and regular rhythm.     Heart sounds: Normal heart sounds.  Pulmonary:     Effort: Pulmonary effort is normal.     Breath sounds: Normal breath sounds.  Abdominal:     General: Bowel sounds are normal.      Palpations: Abdomen is soft.  Musculoskeletal:     Cervical back: Normal range of motion.  Lymphadenopathy:     Cervical: No cervical adenopathy.  Skin:    General: Skin is warm and dry.     Capillary Refill: Capillary refill takes less than 2 seconds.  Neurological:     Mental Status: She is alert and oriented to person, place, and time.  Psychiatric:        Behavior: Behavior normal.      Musculoskeletal Exam: C-spine, thoracic and lumbar spine with good range of motion.  Shoulder joints, elbow joints with good range of motion.  She has limited range of motion of her left wrist joint with flexor and extensor tenosynovitis.  She has synovitis over some of her MCPs and PIPs as described below.  Hip joints, knee joints, ankles with good range of motion.  She has no tenderness across her MTPs or PIPs.  CDAI Exam:  CDAI Score: 13  Patient Global: 5 mm; Provider Global: 5 mm Swollen: 6 ; Tender: 6  Joint Exam 01/07/2020      Right  Left  Wrist     Swollen Tender  MCP 2     Swollen Tender  MCP 3     Swollen Tender  IP     Swollen Tender  PIP 3     Swollen Tender  PIP 5     Swollen Tender     Investigation: No additional findings.  Imaging: No results found.  Recent Labs: Lab Results  Component Value Date   WBC 7.8 12/01/2019   HGB 6.8 (LL) 12/01/2019   PLT 227 12/01/2019   NA 139 12/01/2019   K 3.8 12/01/2019   CL 107 12/01/2019   CO2 23 12/01/2019   GLUCOSE 89 12/01/2019   BUN <5 (L) 12/01/2019   CREATININE 0.73 12/01/2019   BILITOT 3.4 (H) 12/15/2019   ALKPHOS 86 12/01/2019   AST 87 (H) 12/15/2019   ALT 28 12/15/2019   ALT 29 12/15/2019   PROT 7.2 12/15/2019   ALBUMIN 3.8 12/01/2019   CALCIUM 9.4 12/01/2019   GFRAA >60 12/01/2019  10/08/19: RF 107, ANA 1:40 cytoplasmic, 1:40 NS, iron panel WNL, TPO 36, thyroglobulin ab 809, ESR 6, CRP 3.1  Dec 15, 2019 hep C RNA 218,000, hep B-, HIV negative   September 09, 2017 chest x-ray showed chronic cardiomegaly and  chest x-ray showed streaky opacities at the bases compatible with a scarring.  No acute disease was noted.  Read by Dr. Jorje Guild.  Speciality Comments: No specialty comments available.  Procedures:  No procedures performed Allergies: Ampicillin and Demerol [meperidine]   Assessment / Plan:     Visit Diagnoses: Rheumatoid arthritis involving multiple sites with positive rheumatoid factor ([place]) -patient gives history of arthritis in her left hand since 2009.  She was diagnosed with rheumatoid arthritis by Dr. Earnest Conroy in [facility].  She was on prednisone for few years and then she was switched to Remicade infusions.  She recalls being on Remicade infusions for couple of years and did well.  But as it was not covered by Medicaid the Remicade infusions were discontinued.  Patient states she has not been on any treatment over the last 5 years.  She has severe pain and swelling in her left hand today.  There was evidence of synovitis and tenosynovitis as described above.  She has hepatitis C and has not been treated.  Treatment options are limited.  I will obtain labs today.  She may benefit from Enbrel as a treatment choice.  Plan: Cyclic citrul peptide antibody, IgG, 14-3-3 eta Protein  High risk medication use she will be starting high risk medications.  I will obtain following labs today.- Plan: QuantiFERON-TB Gold Plus, Serum protein electrophoresis with reflex, IgG, IgA, IgM, Thiopurine methyltransferase(tpmt)rbc, DG Chest 2 View today.  She had a chest x-ray in the past which showed some opacities.  I will obtain another film for comparison.  Pain in both hands -she complains of some discomfort in her right hand but no swelling.  Left hand shows synovitis and tenosynovitis.  Plan: XR Hand 2 View Right, XR Hand 2 View Left,.  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  Uric acid  Pain in both feet -she is occasional discomfort in  her feet but no synovitis was noted.  Plan: XR Foot 2 Views Right, XR Foot 2 Views  Left.  X-rays are consistent with osteoarthritis.  Chronic hepatitis C without hepatic coma ([place]) - reports she has not been treated for hep c. Hep C ab+, HCV RNA quant+, PTH 23, TSH 0.03, T4 1.3.  Patient states she has an appointment at hepatitis clinic.  Hypertensive retinopathy of both eyes  Neuropathy  History of hypothyroidism  History of sickle cell anemia - sickle cell crisis in 2016. Hx of blood transfusions.  Other fatigue - Plan: CK  Orders: Orders Placed This Encounter  Procedures  . XR Hand 2 View Right  . XR Hand 2 View Left  . XR Foot 2 Views Right  . XR Foot 2 Views Left  . DG Chest 2 View  . CK  . Cyclic citrul peptide antibody, IgG  . 14-3-3 eta Protein  . Uric acid  . QuantiFERON-TB Gold Plus  . Serum protein electrophoresis with reflex  . IgG, IgA, IgM  . Thiopurine methyltransferase(tpmt)rbc   No orders of the defined types were placed in this encounter.   Face-to-face time spent with patient was 60 minutes. Greater than 50% of time was spent in counseling and coordination of care.  Follow-Up Instructions: Return for Rheumatoid arthritis.   Bo Merino, MD  Note - This record has been created using Editor, commissioning.  Chart creation errors have been sought, but may not always  have been located. Such creation errors do not reflect on  the standard of medical care.

## 2019-12-28 LAB — LIVER FIBROSIS, FIBROTEST-ACTITEST
ALT: 29 U/L (ref 6–29)
Alpha-2-Macroglobulin: 243 mg/dL (ref 106–279)
Apolipoprotein A1: 121 mg/dL (ref 101–198)
Bilirubin: 3.2 mg/dL — ABNORMAL HIGH (ref 0.2–1.2)
Fibrosis Score: 0.91
GGT: 23 U/L (ref 3–70)
Haptoglobin: <8 mg/dL — ABNORMAL LOW (ref 43–212)
Necroinflammat ACT Score: 0.29
Reference ID: 3418447

## 2019-12-28 LAB — HEPATIC FUNCTION PANEL
AG Ratio: 1.2 (calc) (ref 1.0–2.5)
ALT: 28 U/L (ref 6–29)
AST: 87 U/L — ABNORMAL HIGH (ref 10–35)
Albumin: 3.9 g/dL (ref 3.6–5.1)
Alkaline phosphatase (APISO): 105 U/L (ref 37–153)
Bilirubin, Direct: 0.7 mg/dL — ABNORMAL HIGH (ref 0.0–0.2)
Globulin: 3.3 g/dL (calc) (ref 1.9–3.7)
Indirect Bilirubin: 2.7 mg/dL (calc) — ABNORMAL HIGH (ref 0.2–1.2)
Total Bilirubin: 3.4 mg/dL — ABNORMAL HIGH (ref 0.2–1.2)
Total Protein: 7.2 g/dL (ref 6.1–8.1)

## 2019-12-28 LAB — HEPATITIS C RNA QUANTITATIVE
HCV Quantitative Log: 5.34 Log IU/mL — ABNORMAL HIGH
HCV RNA, PCR, QN: 218000 IU/mL — ABNORMAL HIGH

## 2019-12-28 LAB — PROTIME-INR
INR: 1
Prothrombin Time: 11.2 s (ref 9.0–11.5)

## 2019-12-28 LAB — HEPATITIS B SURFACE ANTIGEN: Hepatitis B Surface Ag: NONREACTIVE

## 2019-12-28 LAB — HEPATITIS B SURFACE ANTIBODY,QUALITATIVE: Hep B S Ab: NONREACTIVE

## 2019-12-28 LAB — HIV ANTIBODY (ROUTINE TESTING W REFLEX): HIV 1&2 Ab, 4th Generation: NONREACTIVE

## 2019-12-28 LAB — HEPATITIS C GENOTYPE

## 2019-12-31 ENCOUNTER — Telehealth: Payer: Self-pay | Admitting: Family

## 2019-12-31 DIAGNOSIS — K7400 Hepatic fibrosis, unspecified: Secondary | ICD-10-CM

## 2019-12-31 DIAGNOSIS — B182 Chronic viral hepatitis C: Secondary | ICD-10-CM

## 2019-12-31 MED ORDER — HARVONI 45-200 MG PO TABS: 1.0000 | ORAL_TABLET | Freq: Every day | ORAL | 2 refills | Status: DC

## 2019-12-31 NOTE — Telephone Encounter (Signed)
[name] is a 59 y/o female with Genotype 1b chronic hepatitis C with initial viral load of 218,000 with fibrosis score of F4 and corresponding APRI of 0.958 and FIB4 of 4.27. There is concern for significant fibrosis and possibly cirrhosis. Referral to GI will be placed. CT scan with no focal liver abnormalities seen. Will treat with consideration for compensated cirrhosis. Will plan for treatment with 12 weeks of Harvoni. Attempted to leave message but there was a different name associated with mailbox.

## 2020-01-01 ENCOUNTER — Telehealth: Payer: Self-pay | Admitting: Pharmacy Technician

## 2020-01-01 NOTE — Telephone Encounter (Signed)
[facility] Patient Advocate Encounter   Received notification from [facility] that prior authorization for Harvoni is required.   PA submitted on 01/01/2020 Key [id number] Status is pending    [facility] will continue to follow.  Beulah Gandy, CPhT Specialty Pharmacy Patient [facility] for Infectious Disease Phone: [phone] Fax: [phone] 01/01/2020 12:37 PM

## 2020-01-04 NOTE — Telephone Encounter (Signed)
[facility] Patient Advocate Encounter  Prior Authorization for [name] has been approved.    PA# [id number] Effective dates: through 03/26/2020  Patients co-pay is $$4.   [facility] will continue to follow.  Netty Starring. Dimas Aguas CPhT Specialty Pharmacy Patient [facility] for Infectious Disease Phone: [phone] Fax:  [phone]

## 2020-01-07 ENCOUNTER — Ambulatory Visit: Payer: Self-pay

## 2020-01-07 ENCOUNTER — Encounter: Payer: Self-pay | Admitting: Rheumatology

## 2020-01-07 ENCOUNTER — Ambulatory Visit (HOSPITAL_COMMUNITY)
Admission: RE | Admit: 2020-01-07 | Discharge: 2020-01-07 | Disposition: A | Discharge status: Home / Self Care | Payer: Medicare HMO | Source: Ambulatory Visit | Attending: Rheumatology | Admitting: Rheumatology

## 2020-01-07 ENCOUNTER — Other Ambulatory Visit: Payer: Self-pay

## 2020-01-07 ENCOUNTER — Ambulatory Visit (INDEPENDENT_AMBULATORY_CARE_PROVIDER_SITE_OTHER): Payer: Medicare HMO | Admitting: Rheumatology

## 2020-01-07 VITALS — BP 115/56 | HR 67 | Resp 14 | Ht 71.0 in | Wt 151.0 lb

## 2020-01-07 DIAGNOSIS — M79641 Pain in right hand: Secondary | ICD-10-CM

## 2020-01-07 DIAGNOSIS — M79642 Pain in left hand: Secondary | ICD-10-CM | POA: Diagnosis not present

## 2020-01-07 DIAGNOSIS — H35033 Hypertensive retinopathy, bilateral: Secondary | ICD-10-CM

## 2020-01-07 DIAGNOSIS — Z8639 Personal history of other endocrine, nutritional and metabolic disease: Secondary | ICD-10-CM

## 2020-01-07 DIAGNOSIS — B182 Chronic viral hepatitis C: Secondary | ICD-10-CM

## 2020-01-07 DIAGNOSIS — Z79899 Other long term (current) drug therapy: Secondary | ICD-10-CM

## 2020-01-07 DIAGNOSIS — M0579 Rheumatoid arthritis with rheumatoid factor of multiple sites without organ or systems involvement: Secondary | ICD-10-CM

## 2020-01-07 DIAGNOSIS — M79671 Pain in right foot: Secondary | ICD-10-CM | POA: Diagnosis not present

## 2020-01-07 DIAGNOSIS — G629 Polyneuropathy, unspecified: Secondary | ICD-10-CM

## 2020-01-07 DIAGNOSIS — M79672 Pain in left foot: Secondary | ICD-10-CM | POA: Diagnosis not present

## 2020-01-07 DIAGNOSIS — R5383 Other fatigue: Secondary | ICD-10-CM

## 2020-01-07 DIAGNOSIS — Z862 Personal history of diseases of the blood and blood-forming organs and certain disorders involving the immune mechanism: Secondary | ICD-10-CM

## 2020-01-08 ENCOUNTER — Encounter: Payer: Self-pay | Admitting: Pharmacy Technician

## 2020-01-08 ENCOUNTER — Other Ambulatory Visit: Payer: Self-pay | Admitting: *Deleted

## 2020-01-08 ENCOUNTER — Telehealth: Payer: Self-pay | Admitting: Rheumatology

## 2020-01-08 DIAGNOSIS — J849 Interstitial pulmonary disease, unspecified: Secondary | ICD-10-CM

## 2020-01-08 NOTE — Telephone Encounter (Signed)
Keri from Radiology Scheduling called requesting a return call regarding patient's CT scheduled for 01/12/20.  Keri states there is no authorization on referral and was told without the PA the appointment would need to be rescheduled.  Please call back at #[phone]

## 2020-01-08 NOTE — Progress Notes (Signed)
I discussed the chest x-ray results with the patient.  Please schedule high-resolution CT supine and prone to evaluate for ILD.

## 2020-01-08 NOTE — Telephone Encounter (Signed)
[facility] Patient Advocate Encounter   I was successful in securing patient a $30,000 grant from [employer] to provide copayment coverage for Harvoni. This will make the out of pocket cost $0.     I have spoken with the patient and they will have the medication shipped 06/21 to arrive 06/22. She will begin Tuesday when the medication arrives.     The billing information is: RxBin: [id number] PCN: [id number] Member ID: [id number] Group ID: [id number] Dates of Eligibility: 12/09/2019 through 12/07/2020  Patient knows to call the office with questions or concerns.  Beulah Gandy, CPhT Specialty Pharmacy Patient [facility] for Infectious Disease Phone: [phone] Fax: [phone] 01/08/2020 3:31 PM

## 2020-01-11 ENCOUNTER — Other Ambulatory Visit: Payer: Self-pay | Admitting: Pharmacist

## 2020-01-11 ENCOUNTER — Telehealth: Payer: Self-pay | Admitting: Pharmacist

## 2020-01-11 ENCOUNTER — Encounter: Payer: Self-pay | Admitting: Pharmacy Technician

## 2020-01-11 DIAGNOSIS — B182 Chronic viral hepatitis C: Secondary | ICD-10-CM

## 2020-01-11 MED ORDER — LEDIPASVIR-SOFOSBUVIR 90-400 MG PO TABS: 1.0000 | ORAL_TABLET | Freq: Every day | ORAL | 2 refills | Status: DC

## 2020-01-11 MED FILL — LEDIPASVIR-SOFOSBUVIR 90-40: 90-400 | 28 days supply | Qty: 28 | Fill #0

## 2020-01-11 NOTE — Telephone Encounter (Signed)
Thanks

## 2020-01-11 NOTE — Telephone Encounter (Cosign Needed)
I was able to reach [facility] by phone today before she starts her treatment with Harvoni x12 weeks for her hepatitis C infection. I told her that Hassell Halim is a one pill once a day medication that can be taken with or without food. Some of the most common side effects include, headache, nausea, fatigue, and insomnia. If she gets nauseous she can take it with some food to help and she should use ibuprofen instead of tylenol if she needs something for a headache. She does not take any acid reducing medications like TUMs, H2RAs, or PPIs and does not plan to start. I told her taking this medication at the same time everyday tends to help patients remember to take their medication better. Adherence is the most important thing when it comes to this medication and 100% adherence gives her the best chance of cure. She should be receiving her medication tomorrow 6/22 by mail and that is when she will start treatment she says. She has an appointment with Cassie on 7/22, she knows to give [place] a call with any issues/questions.

## 2020-01-12 ENCOUNTER — Ambulatory Visit (HOSPITAL_COMMUNITY): Payer: Medicare HMO

## 2020-01-13 LAB — CYCLIC CITRUL PEPTIDE ANTIBODY, IGG: Cyclic Citrullin Peptide Ab: <16 UNITS

## 2020-01-13 LAB — QUANTIFERON-TB GOLD PLUS
Mitogen-NIL: 2.75 IU/mL
NIL: 0.13 IU/mL
QuantiFERON-TB Gold Plus: NEGATIVE
TB1-NIL: 0 IU/mL
TB2-NIL: <0 IU/mL

## 2020-01-13 LAB — PROTEIN ELECTROPHORESIS, SERUM, WITH REFLEX
Albumin ELP: 4.1 g/dL (ref 3.8–4.8)
Alpha 1: 0.3 g/dL (ref 0.2–0.3)
Alpha 2: 0.6 g/dL (ref 0.5–0.9)
Beta 2: 0.5 g/dL (ref 0.2–0.5)
Beta Globulin: 0.4 g/dL (ref 0.4–0.6)
Gamma Globulin: 2 g/dL — ABNORMAL HIGH (ref 0.8–1.7)
Total Protein: 7.8 g/dL (ref 6.1–8.1)

## 2020-01-13 LAB — IGG, IGA, IGM
IgG (Immunoglobin G), Serum: 2307 mg/dL — ABNORMAL HIGH (ref 600–1640)
IgM, Serum: 259 mg/dL (ref 50–300)
Immunoglobulin A: 345 mg/dL — ABNORMAL HIGH (ref 47–310)

## 2020-01-13 LAB — CK: Total CK: 40 U/L (ref 29–143)

## 2020-01-13 LAB — THIOPURINE METHYLTRANSFERASE (TPMT), RBC: Thiopurine Methyltransferase, RBC: 25 nmol/hr/mL RBC

## 2020-01-13 LAB — URIC ACID: Uric Acid, Serum: 5.6 mg/dL (ref 2.5–7.0)

## 2020-01-13 LAB — 14-3-3 ETA PROTEIN: 14-3-3 eta Protein: <0.2 ng/mL (ref <0.2)

## 2020-01-13 NOTE — Progress Notes (Signed)
I will discuss results at the follow-up visit.

## 2020-01-24 NOTE — Progress Notes (Deleted)
Office Visit Note  Patient: [name]             Date of Birth: February 06, 1961           MRN: [id number]             PCP: Raymon Mutton., FNP Referring: Kallie Locks, FNP Visit Date: 02/02/2020 Occupation: @GUAROCC @  Subjective:  No chief complaint on file.   History of Present Illness: [name] is a 59 y.o. female ***   Activities of Daily Living:  Patient reports morning stiffness for *** {minute/hour:19697}.   Patient {ACTIONS;DENIES/REPORTS:21021675::"Denies"} nocturnal pain.  Difficulty dressing/grooming: {ACTIONS;DENIES/REPORTS:21021675::"Denies"} Difficulty climbing stairs: {ACTIONS;DENIES/REPORTS:21021675::"Denies"} Difficulty getting out of chair: {ACTIONS;DENIES/REPORTS:21021675::"Denies"} Difficulty using hands for taps, buttons, cutlery, and/or writing: {ACTIONS;DENIES/REPORTS:21021675::"Denies"}  No Rheumatology ROS completed.   PMFS History:  Patient Active Problem List   Diagnosis Date Noted  . Acute on chronic anemia 11/10/2019  . Sickle cell anemia with crisis (HCC) 11/19/2017  . Symptomatic anemia 02/06/2017  . Leg wound, right 02/06/2017  . Sickle cell crisis (HCC) 10/24/2015  . Anemia of chronic disease   . Hepatitis C 05/11/2014  . 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 Thrombophlebitis leg superficial 03/04/2013  . Rheumatoid arthritis (HCC) 12/16/2012  . Hypothyroidism 11/27/2011    Past Medical History:  Diagnosis Date  . Aneurysm (HCC)   . Anxiety   . Arthritis   . Colon ulcer   . Gall stones   . Hypokalemia   . Hypothyroidism   . Pneumonia   . Sickle cell anemia (HCC)   . Ulcers of both lower extremities (HCC)     Family History  Problem Relation Age of Onset  . Cancer Mother 66       Esophageal  . Sickle cell trait Mother   . Sickle cell trait Father   . HIV/AIDS Brother    Past Surgical History:  Procedure Laterality Date  . ANKLE SURGERY  1989 and 1990  . CEREBRAL ANEURYSM REPAIR    . COLONOSCOPY   05/12/2012   Procedure: COLONOSCOPY;  Surgeon: 05/14/2012, MD;  Location: [facility] ENDOSCOPY;  Service: Endoscopy;  Laterality: N/A;  . ESOPHAGOGASTRODUODENOSCOPY  05/12/2012   Procedure: ESOPHAGOGASTRODUODENOSCOPY (EGD);  Surgeon: 05/14/2012, MD;  Location: [facility];  Service: Endoscopy;  Laterality: N/A;  . gall stone removal    . GIVENS CAPSULE STUDY  05/13/2012   Procedure: GIVENS CAPSULE STUDY;  Surgeon: 05/15/2012, MD;  Location: [facility] ENDOSCOPY;  Service: Endoscopy;  Laterality: N/A;   Social History   Social History Narrative  . Not on file   Immunization History  Administered Date(s) Administered  . Hepatitis B 05/18/2015  . Influenza-Unspecified 04/24/2007  . Meningococcal Conjugate 01/09/2007  . Pneumococcal Conjugate-13 05/18/2015  . Pneumococcal Polysaccharide-23 05/11/2014  . Tdap 09/24/2017     Objective: Vital Signs: There were no vitals taken for this visit.   Physical Exam   Musculoskeletal Exam: ***  CDAI Exam: CDAI Score: -- Patient Global: --; Provider Global: -- Swollen: --; Tender: -- Joint Exam 02/02/2020   No joint exam has been documented for this visit   There is currently no information documented on the homunculus. Go to the Rheumatology activity and complete the homunculus joint exam.  Investigation: No additional findings.  Imaging: DG Chest 2 View  Result Date: 01/07/2020 CLINICAL DATA:  Rheumatoid arthritis involving multiple sites with positive rheumatoid factor. Immunosuppressive therapy. EXAM: CHEST - 2 VIEW COMPARISON:  Chest x-ray dated 09/09/2017 and  10/24/2015 and chest CT dated 09/09/2017 FINDINGS: Slight cardiomegaly, unchanged. Aortic atherosclerosis. Slight chronic accentuation of the interstitial markings at the left lung base. No infiltrates or effusions. No significant bone abnormality. IMPRESSION: No acute abnormalities. Chronic accentuation of the interstitial markings at the left lung base. Electronically Signed   By:  Lorriane Shire M.D.   On: 01/07/2020 16:05   XR Foot 2 Views Left  Result Date: 01/07/2020 PIP and DIP narrowing was noted.  No MTP, or intertarsal joint space narrowing was noted.  Inferior calcaneal spur was noted. Impression: These findings are consistent with osteoarthritis.  XR Foot 2 Views Right  Result Date: 01/07/2020 PIP and DIP narrowing was noted.  No MTP, or intertarsal joint space narrowing was noted.  Inferior calcaneal spur was noted. Impression: These findings are consistent with osteoarthritis.  XR Hand 2 View Left  Result Date: 01/07/2020 Juxta-articular osteopenia was noted.  Narrowing of third and fourth MCPs was noted.  Severe narrowing of all PIP joints with erosive changes of the third PIP joint was noted.  Erosive changes were noted in the metacarpal carpal joints carpals and radius and ulnar styloid.  Severe narrowing of metacarpocarpal intercarpal and radiocarpal joints was noted. Impression: These findings are consistent with severe erosive rheumatoid arthritis and osteoarthritis.  XR Hand 2 View Right  Result Date: 01/07/2020 Juxta-articular osteopenia was noted.  Mild fourth MCP narrowing was noted.  PIP and DIP narrowing was noted.  No intercarpal radiocarpal joint space narrowing was noted.  No erosive changes were noted. Impression: These findings are consistent with osteoarthritis and possible rheumatoid arthritis overlap.   Recent Labs: Lab Results  Component Value Date   WBC 7.8 12/01/2019   HGB 6.8 (LL) 12/01/2019   PLT 227 12/01/2019   NA 139 12/01/2019   K 3.8 12/01/2019   CL 107 12/01/2019   CO2 23 12/01/2019   GLUCOSE 89 12/01/2019   BUN <5 (L) 12/01/2019   CREATININE 0.73 12/01/2019   BILITOT 3.4 (H) 12/15/2019   ALKPHOS 86 12/01/2019   AST 87 (H) 12/15/2019   ALT 28 12/15/2019   ALT 29 12/15/2019   PROT 7.8 01/07/2020   ALBUMIN 3.8 12/01/2019   CALCIUM 9.4 12/01/2019   GFRAA >60 12/01/2019   QFTBGOLDPLUS NEGATIVE 01/07/2020   January 07, 2020 SPEP showed increased immunoglobulin.  IgG elevated.  IgG a elevated.  TB gold negative.  CK 40, TPMT 25 normal, uric acid 5.6, anti-CCP negative, '14 3 3 '$ eta negative.  10/08/19: RF 107, ANA 1:40 cytoplasmic, 1:40 NS, iron panel WNL, TPO 36, thyroglobulin ab 809, ESR 6, CRP 3.1  Dec 15, 2019 hep C RNA 218,000, hep B-, HIV negative   September 09, 2017 chest x-ray showed chronic cardiomegaly and chest x-ray showed streaky opacities at the bases compatible with a scarring.  No acute disease was noted.  Read by Dr. Jorje Guild.  Speciality Comments: No specialty comments available.  Procedures:  No procedures performed Allergies: Ampicillin and Demerol [meperidine]   Assessment / Plan:     Visit Diagnoses: No diagnosis found.  Orders: No orders of the defined types were placed in this encounter.  No orders of the defined types were placed in this encounter.   Face-to-face time spent with patient was *** minutes. Greater than 50% of time was spent in counseling and coordination of care.  Follow-Up Instructions: No follow-ups on file.   Bo Merino, MD  Note - This record has been created using Editor, commissioning.  Chart creation  errors have been sought, but may not always  have been located. Such creation errors do not reflect on  the standard of medical care.

## 2020-01-29 ENCOUNTER — Other Ambulatory Visit: Payer: Self-pay

## 2020-01-29 ENCOUNTER — Ambulatory Visit (HOSPITAL_COMMUNITY)
Admission: RE | Admit: 2020-01-29 | Discharge: 2020-01-29 | Disposition: A | Discharge status: Home / Self Care | Payer: Medicare HMO | Source: Ambulatory Visit | Attending: Rheumatology | Admitting: Rheumatology

## 2020-01-29 ENCOUNTER — Encounter (HOSPITAL_COMMUNITY): Payer: Self-pay

## 2020-01-29 DIAGNOSIS — J849 Interstitial pulmonary disease, unspecified: Secondary | ICD-10-CM | POA: Diagnosis present

## 2020-02-01 NOTE — Progress Notes (Signed)
Please notify patient that her CT scan is consistent with interstitial lung disease.  We have referred her to a pulmonologist.  She also has a calcification in her aortic valve.  If she does not have a cardiologist then refer her to a cardiologist.

## 2020-02-01 NOTE — Progress Notes (Signed)
This is the patient we discussed to schedule at your office.  I have sent a referral already.  High-resolution CT is consistent with ILD.  Please to schedule an appointment to evaluate.

## 2020-02-02 ENCOUNTER — Ambulatory Visit: Payer: Medicare HMO | Admitting: Rheumatology

## 2020-02-04 ENCOUNTER — Ambulatory Visit: Payer: Medicare HMO | Admitting: Rheumatology

## 2020-02-05 MED FILL — LEDIPASVIR-SOFOSBUVIR 90-40: 90-400 | 28 days supply | Qty: 28 | Fill #1

## 2020-02-08 ENCOUNTER — Other Ambulatory Visit: Payer: Self-pay | Admitting: *Deleted

## 2020-02-08 DIAGNOSIS — J849 Interstitial pulmonary disease, unspecified: Secondary | ICD-10-CM

## 2020-02-08 DIAGNOSIS — I359 Nonrheumatic aortic valve disorder, unspecified: Secondary | ICD-10-CM

## 2020-02-11 ENCOUNTER — Other Ambulatory Visit: Payer: Self-pay

## 2020-02-11 ENCOUNTER — Ambulatory Visit (INDEPENDENT_AMBULATORY_CARE_PROVIDER_SITE_OTHER): Payer: Medicare HMO | Admitting: Pharmacist

## 2020-02-11 DIAGNOSIS — B182 Chronic viral hepatitis C: Secondary | ICD-10-CM

## 2020-02-11 DIAGNOSIS — K7400 Hepatic fibrosis, unspecified: Secondary | ICD-10-CM

## 2020-02-11 NOTE — Progress Notes (Signed)
HPI: [name] is a 59 y.o. female who presents to the [facility] pharmacy clinic for Hepatitis C follow-up.  Medication: Harvoni x 12 weeks  Start Date: 01/12/20  Hepatitis C Genotype: 1b  Fibrosis Score: F4 - compensated cirrhosis  Hepatitis C RNA: 218,000 on 12/15/19  Patient Active Problem List   Diagnosis Date Noted  . Acute on chronic anemia 11/10/2019  . Sickle cell anemia with crisis (HCC) 11/19/2017  . Symptomatic anemia 02/06/2017  . Leg wound, right 02/06/2017  . Sickle cell crisis (HCC) 10/24/2015  . Anemia of chronic disease   . Hepatitis C 05/11/2014  . 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 Thrombophlebitis leg superficial 03/04/2013  . Rheumatoid arthritis (HCC) 12/16/2012  . Hypothyroidism 11/27/2011    Patient's Medications  New Prescriptions   No medications on file  Previous Medications   ACETAMINOPHEN (TYLENOL) 650 MG CR TABLET    Take 650 mg by mouth at bedtime as needed for pain.   ASCORBIC ACID (VITAMIN C) 500 MG TABLET    Take 500-1,000 mg by mouth daily.   CALCIUM CITRATE-VITAMIN D (CITRACAL + D PO)    Take by mouth.   CHOLECALCIFEROL (VITAMIN D-3) 25 MCG (1000 UT) CAPS    Take 1,000 Units by mouth daily.   FOLIC ACID (FOLVITE) 400 MCG TABLET    Take 400 mcg by mouth in the morning and at bedtime.   GABAPENTIN (NEURONTIN) 300 MG CAPSULE    Take 300 mg by mouth at bedtime.   LEDIPASVIR-SOFOSBUVIR 90-400 MG TABS    Take 1 tablet by mouth daily.   LEVOTHYROXINE (SYNTHROID) 175 MCG TABLET    Take 175 mcg by mouth daily before breakfast.   OMEGA-3 ACID ETHYL ESTERS (LOVAZA) 1 G CAPSULE    Take 1 g by mouth daily.  Modified Medications   No medications on file  Discontinued Medications   No medications on file    Allergies: Allergies  Allergen Reactions  . Ampicillin Other (See Comments)    Caused red bumps on the skin, but nothing else Per patient, she tolerates cephalosporins without problems (??)  . Demerol [Meperidine] Other (See  Comments)    A high(er) dose caused seizure(s)    Past Medical History: Past Medical History:  Diagnosis Date  . Aneurysm (HCC)   . Anxiety   . Arthritis   . Colon ulcer   . Gall stones   . Hypokalemia   . Hypothyroidism   . Pneumonia   . Sickle cell anemia (HCC)   . Ulcers of both lower extremities (HCC)     Social History: Social History   Socioeconomic History  . Marital status: Single    Spouse name: Not on file  . Number of children: Not on file  . Years of education: Not on file  . Highest education level: Not on file  Occupational History  . Not on file  Tobacco Use  . Smoking status: Never Smoker  . Smokeless tobacco: Never Used  Vaping Use  . Vaping Use: Never used  Substance and Sexual Activity  . Alcohol use: Yes    Comment: Occasional  . Drug use: No  . Sexual activity: Not Currently  Other Topics Concern  . Not on file  Social History Narrative  . Not on file   Social Determinants of Health   Financial Resource Strain:   . Difficulty of Paying Living Expenses:   Food Insecurity:   . Worried About Programme researcher, broadcasting/film/video in the  Last Year:   . Ran Out of Food in the Last Year:   Transportation Needs:   . Freight forwarder (Medical):   Marland Kitchen Lack of Transportation (Non-Medical):   Physical Activity:   . Days of Exercise per Week:   . Minutes of Exercise per Session:   Stress:   . Feeling of Stress :   Social Connections:   . Frequency of Communication with Friends and Family:   . Frequency of Social Gatherings with Friends and Family:   . Attends Religious Services:   . Active Member of Clubs or Organizations:   . Attends Banker Meetings:   Marland Kitchen Marital Status:     Labs: Hepatitis C Lab Results  Component Value Date   HCVGENOTYPE 1b 12/15/2019   HCVRNAPCRQN 218,000 (H) 12/15/2019   FIBROSTAGE F4 12/15/2019   Hepatitis B Lab Results  Component Value Date   HEPBSAB NON-REACTIVE 12/15/2019   HEPBSAG NON-REACTIVE  12/15/2019   Hepatitis A No results found for: HAV HIV Lab Results  Component Value Date   HIV NON-REACTIVE 12/15/2019   HIV Non Reactive 02/06/2017   Lab Results  Component Value Date   CREATININE 0.73 12/01/2019   CREATININE 0.77 11/11/2019   CREATININE 0.62 11/10/2019   CREATININE 0.53 10/09/2019   CREATININE 0.99 11/18/2017   Lab Results  Component Value Date   AST 87 (H) 12/15/2019   AST 105 (H) 12/01/2019   AST 66 (H) 11/11/2019   ALT 28 12/15/2019   ALT 29 12/15/2019   ALT 26 12/01/2019   INR 1.0 12/15/2019   INR 1.1 11/10/2019   INR 1.23 02/06/2017    Assessment: [name] is here today for her 4-week Hepatitis C follow up appointment. She started Harvoni for 12 weeks on 6/22 and is doing well so far. She has noticed no side effects related to the medication and has missed no doses since starting. She takes the tablet every morning before breakfast. She has started no new medications since starting Harvoni and has had no issues getting her refills from the pharmacy. Will check labs today and bring her back to see Tammy Sours when she has completed treatment.  She knows to call with any questions or concerns in the meantime.   Plan: - Continue Harvoni x 12 weeks - Hep C viral load + CMET today - F/u with Tammy Sours 10/4 at 245pm  Teryl Gubler L. Kadence Mikkelson, PharmD, BCIDP, AAHIVP, CPP Clinical Pharmacist Practitioner Infectious Diseases Clinical Pharmacist [facility] 02/11/2020, 3:16 PM

## 2020-02-17 LAB — HEPATITIS C RNA QUANTITATIVE
HCV Quantitative Log: <1.18 Log IU/mL
HCV RNA, PCR, QN: <15 IU/mL

## 2020-02-17 LAB — COMPREHENSIVE METABOLIC PANEL
AG Ratio: 1.2 (calc) (ref 1.0–2.5)
ALT: 12 U/L (ref 6–29)
AST: 64 U/L — ABNORMAL HIGH (ref 10–35)
Albumin: 4 g/dL (ref 3.6–5.1)
Alkaline phosphatase (APISO): 80 U/L (ref 37–153)
BUN/Creatinine Ratio: 6 (calc) (ref 6–22)
BUN: 5 mg/dL — ABNORMAL LOW (ref 7–25)
CO2: 26 mmol/L (ref 20–32)
Calcium: 9.3 mg/dL (ref 8.6–10.4)
Chloride: 104 mmol/L (ref 98–110)
Creat: 0.79 mg/dL (ref 0.50–1.05)
Globulin: 3.4 g/dL (calc) (ref 1.9–3.7)
Glucose, Bld: 86 mg/dL (ref 65–99)
Potassium: 4.1 mmol/L (ref 3.5–5.3)
Sodium: 137 mmol/L (ref 135–146)
Total Bilirubin: 5 mg/dL — ABNORMAL HIGH (ref 0.2–1.2)
Total Protein: 7.4 g/dL (ref 6.1–8.1)

## 2020-02-24 ENCOUNTER — Emergency Department (HOSPITAL_COMMUNITY)
Admission: EM | Admit: 2020-02-24 | Discharge: 2020-02-25 | Disposition: A | Discharge status: Home / Self Care | Payer: Medicare HMO | Attending: Emergency Medicine | Admitting: Emergency Medicine

## 2020-02-24 ENCOUNTER — Other Ambulatory Visit: Payer: Self-pay

## 2020-02-24 ENCOUNTER — Encounter (HOSPITAL_COMMUNITY): Payer: Self-pay | Admitting: Emergency Medicine

## 2020-02-24 DIAGNOSIS — Z5321 Procedure and treatment not carried out due to patient leaving prior to being seen by health care provider: Secondary | ICD-10-CM | POA: Diagnosis not present

## 2020-02-24 DIAGNOSIS — K625 Hemorrhage of anus and rectum: Secondary | ICD-10-CM | POA: Insufficient documentation

## 2020-02-24 LAB — CBC
HCT: 15.8 % — ABNORMAL LOW (ref 36.0–46.0)
Hemoglobin: 5.6 g/dL — CL (ref 12.0–15.0)
MCH: 31.5 pg (ref 26.0–34.0)
MCHC: 35.4 g/dL (ref 30.0–36.0)
MCV: 88.8 fL (ref 80.0–100.0)
Platelets: 198 10*3/uL (ref 150–400)
RBC: 1.78 MIL/uL — ABNORMAL LOW (ref 3.87–5.11)
RDW: 22.5 % — ABNORMAL HIGH (ref 11.5–15.5)
WBC: 9.3 10*3/uL (ref 4.0–10.5)
nRBC: 16.2 % — ABNORMAL HIGH (ref 0.0–0.2)

## 2020-02-24 LAB — COMPREHENSIVE METABOLIC PANEL
ALT: 16 U/L (ref 0–44)
AST: 76 U/L — ABNORMAL HIGH (ref 15–41)
Albumin: 3.7 g/dL (ref 3.5–5.0)
Alkaline Phosphatase: 74 U/L (ref 38–126)
Anion gap: 7 (ref 5–15)
BUN: <5 mg/dL — ABNORMAL LOW (ref 6–20)
CO2: 25 mmol/L (ref 22–32)
Calcium: 9.4 mg/dL (ref 8.9–10.3)
Chloride: 106 mmol/L (ref 98–111)
Creatinine, Ser: 0.76 mg/dL (ref 0.44–1.00)
GFR calc Af Amer: >60 mL/min (ref >60)
GFR calc non Af Amer: >60 mL/min (ref >60)
Glucose, Bld: 87 mg/dL (ref 70–99)
Potassium: 4.1 mmol/L (ref 3.5–5.1)
Sodium: 138 mmol/L (ref 135–145)
Total Bilirubin: 5.1 mg/dL — ABNORMAL HIGH (ref 0.3–1.2)
Total Protein: 7.5 g/dL (ref 6.5–8.1)

## 2020-02-24 LAB — TYPE AND SCREEN
ABO/RH(D): B POS
Antibody Screen: NEGATIVE

## 2020-02-24 NOTE — ED Triage Notes (Signed)
Pt sent here by PCP for low hg, pt states she is having dark stool for about a month.

## 2020-02-26 ENCOUNTER — Encounter (HOSPITAL_COMMUNITY): Payer: Self-pay

## 2020-02-26 ENCOUNTER — Other Ambulatory Visit: Payer: Self-pay

## 2020-02-26 DIAGNOSIS — D649 Anemia, unspecified: Secondary | ICD-10-CM | POA: Insufficient documentation

## 2020-02-26 DIAGNOSIS — Z79890 Hormone replacement therapy (postmenopausal): Secondary | ICD-10-CM | POA: Insufficient documentation

## 2020-02-26 DIAGNOSIS — M2550 Pain in unspecified joint: Secondary | ICD-10-CM | POA: Diagnosis not present

## 2020-02-26 DIAGNOSIS — E039 Hypothyroidism, unspecified: Secondary | ICD-10-CM | POA: Diagnosis not present

## 2020-02-26 DIAGNOSIS — R5383 Other fatigue: Secondary | ICD-10-CM | POA: Insufficient documentation

## 2020-02-26 LAB — CBC WITH DIFFERENTIAL/PLATELET
Abs Immature Granulocytes: 0.11 10*3/uL — ABNORMAL HIGH (ref 0.00–0.07)
Basophils Absolute: 0.1 10*3/uL (ref 0.0–0.1)
Basophils Relative: 1 %
Eosinophils Absolute: 0.3 10*3/uL (ref 0.0–0.5)
Eosinophils Relative: 2 %
HCT: 16.8 % — ABNORMAL LOW (ref 36.0–46.0)
Hemoglobin: 6 g/dL — CL (ref 12.0–15.0)
Immature Granulocytes: 1 %
Lymphocytes Relative: 24 %
Lymphs Abs: 3.2 10*3/uL (ref 0.7–4.0)
MCH: 32.3 pg (ref 26.0–34.0)
MCHC: 35.7 g/dL (ref 30.0–36.0)
MCV: 90.3 fL (ref 80.0–100.0)
Monocytes Absolute: 1.3 10*3/uL — ABNORMAL HIGH (ref 0.1–1.0)
Monocytes Relative: 10 %
Neutro Abs: 8.1 10*3/uL — ABNORMAL HIGH (ref 1.7–7.7)
Neutrophils Relative %: 62 %
Platelets: 202 10*3/uL (ref 150–400)
RBC: 1.86 MIL/uL — ABNORMAL LOW (ref 3.87–5.11)
RDW: 24.6 % — ABNORMAL HIGH (ref 11.5–15.5)
WBC: 13.1 10*3/uL — ABNORMAL HIGH (ref 4.0–10.5)
nRBC: 19.8 % — ABNORMAL HIGH (ref 0.0–0.2)

## 2020-02-26 LAB — COMPREHENSIVE METABOLIC PANEL
ALT: 14 U/L (ref 0–44)
AST: 79 U/L — ABNORMAL HIGH (ref 15–41)
Albumin: 4.1 g/dL (ref 3.5–5.0)
Alkaline Phosphatase: 69 U/L (ref 38–126)
Anion gap: 9 (ref 5–15)
BUN: 7 mg/dL (ref 6–20)
CO2: 23 mmol/L (ref 22–32)
Calcium: 9.4 mg/dL (ref 8.9–10.3)
Chloride: 105 mmol/L (ref 98–111)
Creatinine, Ser: 0.71 mg/dL (ref 0.44–1.00)
GFR calc Af Amer: >60 mL/min (ref >60)
GFR calc non Af Amer: >60 mL/min (ref >60)
Glucose, Bld: 98 mg/dL (ref 70–99)
Potassium: 4.3 mmol/L (ref 3.5–5.1)
Sodium: 137 mmol/L (ref 135–145)
Total Bilirubin: 5.3 mg/dL — ABNORMAL HIGH (ref 0.3–1.2)
Total Protein: 8.5 g/dL — ABNORMAL HIGH (ref 6.5–8.1)

## 2020-02-26 LAB — RETICULOCYTES
Immature Retic Fract: 29.3 % — ABNORMAL HIGH (ref 2.3–15.9)
RBC.: 1.86 MIL/uL — ABNORMAL LOW (ref 3.87–5.11)
Retic Count, Absolute: 576.5 10*3/uL — ABNORMAL HIGH (ref 19.0–186.0)
Retic Ct Pct: 29.6 % — ABNORMAL HIGH (ref 0.4–3.1)

## 2020-02-26 MED ORDER — SODIUM CHLORIDE 0.9% FLUSH: 3.0000 mL | Freq: Once | INTRAVENOUS | Status: DC

## 2020-02-26 NOTE — ED Triage Notes (Signed)
Arrived POV from home. Patient reports sickle cell pain that started around 1600 today. Patient reports pain in wrists, hips, knees, and ankles.

## 2020-02-27 ENCOUNTER — Emergency Department (HOSPITAL_COMMUNITY)
Admission: EM | Admit: 2020-02-27 | Discharge: 2020-02-27 | Disposition: A | Discharge status: Home / Self Care | Payer: Medicare HMO | Attending: Emergency Medicine | Admitting: Emergency Medicine

## 2020-02-27 DIAGNOSIS — M2550 Pain in unspecified joint: Secondary | ICD-10-CM | POA: Diagnosis not present

## 2020-02-27 DIAGNOSIS — D649 Anemia, unspecified: Secondary | ICD-10-CM

## 2020-02-27 LAB — PREPARE RBC (CROSSMATCH)

## 2020-02-27 MED ORDER — KETOROLAC TROMETHAMINE 30 MG/ML IJ SOLN
30.0000 mg | Freq: Once | INTRAMUSCULAR | Status: AC
  Administered 2020-02-27: 30 mg via INTRAVENOUS
  Filled 2020-02-27: qty 1

## 2020-02-27 MED ORDER — SODIUM CHLORIDE 0.9% IV SOLUTION: Freq: Once | INTRAVENOUS | Status: AC

## 2020-02-27 NOTE — ED Provider Notes (Signed)
[facility]-EMERGENCY DEPT Provider Note   CSN: [id number] Arrival date & time: 02/26/20  1830     History Chief Complaint  Patient presents with  . Sickle Cell Pain Crisis    [name] is a 59 y.o. female.  Patient with a history of sickle cell anemia, hypothyroidism, arthritis, presents with pain limited to joints since yesterday. She reports being seen by her PCP 2 days ago and was told she needed to go to the hospital for treatment of a low hemoglobin. She came that day but left due to the wait. She reports she felt fine until yesterday and then started having pain in her joints, feeling tired, like prior to previous episodes of anemia. She states she has needed frequent transfusions in the last year. No syncope. No fever, vomiting, diarrhea.   The history is provided by the patient. No language interpreter was used.       Past Medical History:  Diagnosis Date  . Aneurysm (HCC)   . Anxiety   . Arthritis   . Colon ulcer   . Gall stones   . Hypokalemia   . Hypothyroidism   . Pneumonia   . Sickle cell anemia (HCC)   . Ulcers of both lower extremities [facility])     Patient Active Problem List   Diagnosis Date Noted  . Acute on chronic anemia 11/10/2019  . Sickle cell anemia with crisis (HCC) 11/19/2017  . Symptomatic anemia 02/06/2017  . Leg wound, right 02/06/2017  . Sickle cell crisis (HCC) 10/24/2015  . Anemia of chronic disease   . Hepatitis C 05/11/2014  . 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 Thrombophlebitis leg superficial 03/04/2013  . Rheumatoid arthritis (HCC) 12/16/2012  . Hypothyroidism 11/27/2011    Past Surgical History:  Procedure Laterality Date  . ANKLE SURGERY  1989 and 1990  . CEREBRAL ANEURYSM REPAIR    . COLONOSCOPY  05/12/2012   Procedure: COLONOSCOPY;  Surgeon: Hart Carwin, MD;  Location: [facility];  Service: Endoscopy;  Laterality: N/A;  . ESOPHAGOGASTRODUODENOSCOPY  05/12/2012   Procedure:  ESOPHAGOGASTRODUODENOSCOPY (EGD);  Surgeon: Hart Carwin, MD;  Location: [facility];  Service: Endoscopy;  Laterality: N/A;  . gall stone removal    . GIVENS CAPSULE STUDY  05/13/2012   Procedure: GIVENS CAPSULE STUDY;  Surgeon: Hart Carwin, MD;  Location: [facility];  Service: Endoscopy;  Laterality: N/A;     OB History   No obstetric history on file.     Family History  Problem Relation Age of Onset  . Cancer Mother 57       Esophageal  . Sickle cell trait Mother   . Sickle cell trait Father   . HIV/AIDS Brother     Social History   Tobacco Use  . Smoking status: Never Smoker  . Smokeless tobacco: Never Used  Vaping Use  . Vaping Use: Never used  Substance Use Topics  . Alcohol use: Yes    Comment: Occasional  . Drug use: No    Home Medications Prior to Admission medications   Medication Sig Start Date End Date Taking? Authorizing Provider  acetaminophen (TYLENOL) 650 MG CR tablet Take 650 mg by mouth at bedtime as needed for pain.   Yes [provider]  ascorbic acid (VITAMIN C) 500 MG tablet Take 500-1,000 mg by mouth daily.   Yes [provider]  Calcium Citrate-Vitamin D (CITRACAL + D PO) Take 1 tablet by mouth daily.    Yes  [provider]  folic acid (FOLVITE) 400 MCG tablet Take 800 mcg by mouth in the morning and at bedtime.    Yes [provider]  gabapentin (NEURONTIN) 300 MG capsule Take 300 mg by mouth daily.   Yes [provider]  Ledipasvir-Sofosbuvir 90-400 MG TABS Take 1 tablet by mouth daily. 01/11/20  Yes Veryl Speak, FNP  levothyroxine (SYNTHROID) 175 MCG tablet Take 175 mcg by mouth daily before breakfast.   Yes [provider]  montelukast (SINGULAIR) 10 MG tablet Take 10 mg by mouth at bedtime.   Yes [provider]  omega-3 acid ethyl esters (LOVAZA) 1 g capsule Take 1 g by mouth daily.   Yes [provider]  oxyCODONE-acetaminophen (PERCOCET/ROXICET) 5-325 MG  tablet Take 1 tablet by mouth every 6 (six) hours as needed for severe pain.   Yes [provider]    Allergies    Ampicillin and Demerol [meperidine]  Review of Systems   Review of Systems  Constitutional: Positive for fatigue. Negative for chills and fever.  HENT: Negative.   Respiratory: Negative.  Negative for shortness of breath.   Cardiovascular: Negative.  Negative for chest pain.  Gastrointestinal: Negative.   Musculoskeletal: Positive for arthralgias.  Skin: Negative.   Neurological: Negative.     Physical Exam Updated Vital Signs BP 130/68 (BP Location: Right Arm)   Pulse 67   Temp 98.2 F (36.8 C) (Oral)   Resp 18   Ht 5\' 11"  (1.803 m)   Wt 68 kg   SpO2 98%   BMI 20.92 kg/m   Physical Exam Vitals and nursing note reviewed.  Constitutional:      Appearance: She is well-developed.  HENT:     Head: Normocephalic.  Eyes:     Comments: Conjunctival pallor  Cardiovascular:     Rate and Rhythm: Normal rate and regular rhythm.  Pulmonary:     Effort: Pulmonary effort is normal.     Breath sounds: Normal breath sounds.  Abdominal:     General: Bowel sounds are normal.     Palpations: Abdomen is soft.     Tenderness: There is no abdominal tenderness. There is no guarding or rebound.  Musculoskeletal:        General: Normal range of motion.     Cervical back: Normal range of motion and neck supple.     Comments: No joint swelling, warmth or redness.   Skin:    General: Skin is warm and dry.     Findings: No rash.  Neurological:     Mental Status: She is alert and oriented to person, place, and time.     ED Results / Procedures / Treatments   Labs (all labs ordered are listed, but only abnormal results are displayed) Labs Reviewed  COMPREHENSIVE METABOLIC PANEL - Abnormal; Notable for the following components:      Result Value   Total Protein 8.5 (*)    AST 79 (*)    Total Bilirubin 5.3 (*)    All other components within normal limits    CBC WITH DIFFERENTIAL/PLATELET - Abnormal; Notable for the following components:   WBC 13.1 (*)    RBC 1.86 (*)    Hemoglobin 6.0 (*)    HCT 16.8 (*)    RDW 24.6 (*)    nRBC 19.8 (*)    Neutro Abs 8.1 (*)    Monocytes Absolute 1.3 (*)    Abs Immature Granulocytes 0.11 (*)    All other components  within normal limits  RETICULOCYTES - Abnormal; Notable for the following components:   Retic Ct Pct 29.6 (*)    RBC. 1.86 (*)    Retic Count, Absolute 576.5 (*)    Immature Retic Fract 29.3 (*)    All other components within normal limits  PREPARE RBC (CROSSMATCH)    EKG None  Radiology No results found.  Procedures Procedures (including critical care time) CRITICAL CARE Performed by: Arnoldo Hooker   Total critical care time: 45 minutes  Critical care time was exclusive of separately billable procedures and treating other patients.  Critical care was necessary to treat or prevent imminent or life-threatening deterioration.  Critical care was time spent personally by me on the following activities: development of treatment plan with patient and/or surrogate as well as nursing, discussions with consultants, evaluation of patient's response to treatment, examination of patient, obtaining history from patient or surrogate, ordering and performing treatments and interventions, ordering and review of laboratory studies, ordering and review of radiographic studies, pulse oximetry and re-evaluation of patient's condition.  Medications Ordered in ED Medications  sodium chloride flush (NS) 0.9 % injection 3 mL (has no administration in time range)  0.9 %  sodium chloride infusion (Manually program via Guardrails IV Fluids) (has no administration in time range)  ketorolac (TORADOL) 30 MG/ML injection 30 mg (has no administration in time range)    ED Course  I have reviewed the triage vital signs and the nursing notes.  Pertinent labs & imaging results that were available during my  care of the patient were reviewed by me and considered in my medical decision making (see chart for details).    MDM Rules/Calculators/A&P                          Patient to ED with known low hemoglobin found in the outpatient setting and c/o joint pain that is diffuse. No fever. She reports fatigue and generalized weakness. She reports this is her 4th transfusion this year. Being evaluated by rheumatology.   Patient given Toradol with complete relief of her pain. VSS. Hemodynamically stable. Hgb 6.0, baseline appears to be around 7.   Will transfuse in the ED, 1 unit and anticipate discharge home afterward. She is seen by Dr. Effie Shy who agrees with plan of care.  Final Clinical Impression(s) / ED Diagnoses Final diagnoses:  None   1. Anemia 2. Arthralgias  Rx / DC Orders ED Discharge Orders    None       Danne Harbor 03/04/20 [id number]    Mancel Bale, MD 03/04/20 1946    Mancel Bale, MD 03/04/20 [phone]

## 2020-02-27 NOTE — ED Provider Notes (Addendum)
  Face-to-face evaluation   History: She presents for evaluation of achiness in the large joints of her legs, as well as her wrists.  This is typical pain for her.  Recently treated for same.  She has been having transfusions, regularly this year, which is unusual for her.  She does not take chronic pain medicine at home.  Physical exam: Alert, calm and cooperative.  No dysarthria or aphasia.  No visible swelling of the wrists.  She is resting comfortably at 2:35 PM.  Medical screening examination/treatment/procedure(s) were conducted as a shared visit with non-physician practitioner(s) and myself.  I personally evaluated the patient during the encounter     Mancel Bale, MD 03/04/20 [phone]

## 2020-02-27 NOTE — Discharge Instructions (Signed)
Follow up with your doctors as planned.   Please return to the emergency department with any new or concerning symptoms.

## 2020-03-01 LAB — TYPE AND SCREEN
ABO/RH(D): B POS
Antibody Screen: NEGATIVE
Unit division: 0

## 2020-03-01 LAB — BPAM RBC
Blood Product Expiration Date: 202109042359
ISSUE DATE / TIME: 202108071140
Unit Type and Rh: 9500

## 2020-03-07 MED FILL — LEDIPASVIR-SOFOSBUVIR 90-40: 90-400 | 28 days supply | Qty: 28 | Fill #2

## 2020-03-13 NOTE — Progress Notes (Deleted)
Cardiology Office Note:   Date:  03/13/2020  NAME:  [name]    MRN: [id number] DOB:  November 10, 1960   PCP:  Raymon Mutton., FNP  Cardiologist:  No primary care provider on file.  Electrophysiologist:  None   Referring MD: Pollyann Savoy, MD   No chief complaint on file. ***  History of Present Illness:   [name] is a 59 y.o. female with a hx of rheumatoid arthritis, interstitial lung disease who is being seen today for the evaluation of aortic valve calcium at the request of Raymon Mutton., FNP.  Problem List 1. Rheumatoid arthritis  2. Interstitial lung disease  3. Anemia -baseline ~7 4. HCV  Past Medical History: Past Medical History:  Diagnosis Date  . Aneurysm (HCC)   . Anxiety   . Arthritis   . Colon ulcer   . Gall stones   . Hypokalemia   . Hypothyroidism   . Pneumonia   . Sickle cell anemia (HCC)   . Ulcers of both lower extremities (HCC)     Past Surgical History: Past Surgical History:  Procedure Laterality Date  . ANKLE SURGERY  1989 and 1990  . CEREBRAL ANEURYSM REPAIR    . COLONOSCOPY  05/12/2012   Procedure: COLONOSCOPY;  Surgeon: Hart Carwin, MD;  Location: [facility];  Service: Endoscopy;  Laterality: N/A;  . ESOPHAGOGASTRODUODENOSCOPY  05/12/2012   Procedure: ESOPHAGOGASTRODUODENOSCOPY (EGD);  Surgeon: Hart Carwin, MD;  Location: [facility];  Service: Endoscopy;  Laterality: N/A;  . gall stone removal    . GIVENS CAPSULE STUDY  05/13/2012   Procedure: GIVENS CAPSULE STUDY;  Surgeon: Hart Carwin, MD;  Location: [facility];  Service: Endoscopy;  Laterality: N/A;    Current Medications: No outpatient medications have been marked as taking for the 03/14/20 encounter (Appointment) with O'Neal, Ronnald Ramp, MD.     Allergies:    Ampicillin and Demerol [meperidine]   Social History: Social History   Socioeconomic History  . Marital status: Single    Spouse name: Not on file  . Number of children: Not on file  .  Years of education: Not on file  . Highest education level: Not on file  Occupational History  . Not on file  Tobacco Use  . Smoking status: Never Smoker  . Smokeless tobacco: Never Used  Vaping Use  . Vaping Use: Never used  Substance and Sexual Activity  . Alcohol use: Yes    Comment: Occasional  . Drug use: No  . Sexual activity: Not Currently  Other Topics Concern  . Not on file  Social History Narrative  . Not on file   Social Determinants of Health   Financial Resource Strain:   . Difficulty of Paying Living Expenses: Not on file  Food Insecurity:   . Worried About Programme researcher, broadcasting/film/video in the Last Year: Not on file  . Ran Out of Food in the Last Year: Not on file  Transportation Needs:   . Lack of Transportation (Medical): Not on file  . Lack of Transportation (Non-Medical): Not on file  Physical Activity:   . Days of Exercise per Week: Not on file  . Minutes of Exercise per Session: Not on file  Stress:   . Feeling of Stress : Not on file  Social Connections:   . Frequency of Communication with Friends and Family: Not on file  . Frequency of Social Gatherings with Friends and Family: Not on file  . Attends Religious  Services: Not on file  . Active Member of Clubs or Organizations: Not on file  . Attends Banker Meetings: Not on file  . Marital Status: Not on file     Family History: The patient's ***family history includes Cancer (age of onset: 55) in her mother; HIV/AIDS in her brother; Sickle cell trait in her father and mother.  ROS:   All other ROS reviewed and negative. Pertinent positives noted in the HPI.     EKGs/Labs/Other Studies Reviewed:   The following studies were personally reviewed by me today:  EKG:  EKG is *** ordered today.  The ekg ordered today demonstrates ***, and was personally reviewed by me.   Recent Labs: 11/10/2019: TSH 147.044 02/26/2020: ALT 14; BUN 7; Creatinine, Ser 0.71; Hemoglobin 6.0; Platelets 202; Potassium  4.3; Sodium 137   Recent Lipid Panel    Component Value Date/Time   CHOL 133 05/11/2014 1243   TRIG 89 05/11/2014 1243   HDL 49 05/11/2014 1243   CHOLHDL 2.7 05/11/2014 1243   VLDL 18 05/11/2014 1243   LDLCALC 66 05/11/2014 1243    Physical Exam:   VS:  There were no vitals taken for this visit.   Wt Readings from Last 3 Encounters:  02/26/20 150 lb (68 kg)  02/24/20 150 lb (68 kg)  01/07/20 151 lb (68.5 kg)    General: Well nourished, well developed, in no acute distress Heart: Atraumatic, normal size  Eyes: PEERLA, EOMI  Neck: Supple, no JVD Endocrine: No thryomegaly Cardiac: Normal S1, S2; RRR; no murmurs, rubs, or gallops Lungs: Clear to auscultation bilaterally, no wheezing, rhonchi or rales  Abd: Soft, nontender, no hepatomegaly  Ext: No edema, pulses 2+ Musculoskeletal: No deformities, BUE and BLE strength normal and equal Skin: Warm and dry, no rashes   Neuro: Alert and oriented to person, place, time, and situation, CNII-XII grossly intact, no focal deficits  Psych: Normal mood and affect   ASSESSMENT:   [name] is a 59 y.o. female who presents for the following: No diagnosis found.  PLAN:   There are no diagnoses linked to this encounter.  Disposition: No follow-ups on file.  Medication Adjustments/Labs and Tests Ordered: Current medicines are reviewed at length with the patient today.  Concerns regarding medicines are outlined above.  No orders of the defined types were placed in this encounter.  No orders of the defined types were placed in this encounter.   There are no Patient Instructions on file for this visit.   Time Spent with Patient: I have spent a total of *** minutes with patient reviewing hospital notes, telemetry, EKGs, labs and examining the patient as well as establishing an assessment and plan that was discussed with the patient.  > 50% of time was spent in direct patient care.  Signed, Lenna Gilford. Flora Lipps, MD [facility]  [address] [place], [place] [address] [phone]  03/13/2020 4:57 PM

## 2020-03-14 ENCOUNTER — Ambulatory Visit: Payer: Medicare HMO | Admitting: Cardiovascular Disease

## 2020-04-12 ENCOUNTER — Encounter: Payer: Self-pay | Admitting: Nurse Practitioner

## 2020-04-12 ENCOUNTER — Ambulatory Visit (INDEPENDENT_AMBULATORY_CARE_PROVIDER_SITE_OTHER): Payer: Medicare HMO | Admitting: Nurse Practitioner

## 2020-04-12 ENCOUNTER — Other Ambulatory Visit (INDEPENDENT_AMBULATORY_CARE_PROVIDER_SITE_OTHER): Payer: Medicare HMO

## 2020-04-12 VITALS — BP 128/72 | HR 62 | Ht 71.0 in | Wt 153.1 lb

## 2020-04-12 DIAGNOSIS — K625 Hemorrhage of anus and rectum: Secondary | ICD-10-CM

## 2020-04-12 DIAGNOSIS — D500 Iron deficiency anemia secondary to blood loss (chronic): Secondary | ICD-10-CM | POA: Diagnosis not present

## 2020-04-12 LAB — HEMOGLOBIN AND HEMATOCRIT, BLOOD
HCT: 15.9 % — CL (ref 36.0–46.0)
Hemoglobin: 6 g/dL — CL (ref 12.0–15.0)

## 2020-04-12 MED ORDER — NA SULFATE-K SULFATE-MG SULF 17.5-3.13-1.6 GM/177ML PO SOLN: 1.0000 | Freq: Once | ORAL | 0 refills | Status: AC

## 2020-04-12 NOTE — Patient Instructions (Signed)
If you are age 60 or older, your body mass index should be between 23-30. Your Body mass index is 21.36 kg/m. If this is out of the aforementioned range listed, please consider follow up with your Primary Care Provider.  If you are age 36 or younger, your body mass index should be between 19-25. Your Body mass index is 21.36 kg/m. If this is out of the aformentioned range listed, please consider follow up with your Primary Care Provider.    We have sent the following medications to your pharmacy for you to pick up at your convenience:  suprep for colonoscopy  Your provider has requested that you go to the basement level for lab work before leaving today. Press "B" on the elevator. The lab is located at the first door on the left as you exit the elevator.  Due to recent changes in healthcare laws, you may see the results of your imaging and laboratory studies on MyChart before your provider has had a chance to review them.  We understand that in some cases there may be results that are confusing or concerning to you. Not all laboratory results come back in the same time frame and the provider may be waiting for multiple results in order to interpret others.  Please give [place] 48 hours in order for your provider to thoroughly review all the results before contacting the office for clarification of your results.

## 2020-04-12 NOTE — Progress Notes (Signed)
ASSESSMENT AND PLAN    # Rectal bleeding ( 2 episodes in last six months, last one 3 months ago) Also FOBT+ at PCP's office --Last colonoscopy was in 2013.  --She has chronic anemia but likely all due to sickle cell --For further evaluation patient will be scheduled for colonoscopy. Patient will be scheduled for a colonoscopy. The risks and benefits of colonoscopy with possible polypectomy / biopsies were discussed and the patient agrees to proceed -- repeat CBC today. Transfused a unit of blood in ED on 02/26/20 for hgb of 6.   # Chronic hyperbilirubinemia --sickle cell / hemolysis  # RA --Follows with Rheumatology  HISTORY OF PRESENT ILLNESS     Primary Gastroenterologist :Previously Dr. Juanda Chance  Chief Complaint : blood in stool a few months ago.   [name] is a 59 y.o. female with PMH / PSH significant for,  but not necessarily limited to: sickle cell, chronic anemia, hypothyroidism, Rheumatoid arthritis, glaucoma, HCV   Patient referred by PCP for FOBT + .  Patient had an episode of rectal bleeding with a bowel movement 6 months ago, she had a second episode 3 months ago but has not had any bleeding since.  The episodes of rectal bleeding were not associated with any bowel habit changes such as constipation or diarrhea.  She says her bowel movements are normal, she has a bowel movement every day.  Patient says that several months ago she saw her PCP and her stool tested positive during a rectal exam.  She has no GI complaints.  Once a month she gets lower abdominal cramping lasting for about 20 minutes.  Almost feels like menstrual cramps though she has not had a period since being in her forties.  Patient was seen in the ED 02/26/2020.  PCP advised her to go to the hospital for treatment of a low hemoglobin. She was transfused a unit of blood in the ED.   Patient has a history of RA, at one time was on Remicade. She hasn't followed up with Rheumatology due to transportation  issues. .   Data Reviewed: 02/26/20 hgb 6  Previous Endoscopic Evaluations / Pertinent Studies:   October 2013 colonoscopy for Hemoccult positive, sickle cell anemia --Complete exam, good prep --Tiny 3 mm fresh ulcer on IC valve.  October 2013 EGD for Hemoccult positive, sickle cell anemia --Normal  October 2013 small bowel video capsule for Hemoccult positive, sickle cell anemia --Small amount of heme in colon, probably from IC valve biopsies, otherwise negative   Diagnosis Colon, biopsy - FOCAL ACTIVE INFLAMMATION AND FOCAL EROSION. - SEE MICROSCOPIC DESCRIPTION.   Past Medical History:  Diagnosis Date  . Aneurysm (HCC)   . Anxiety   . Arthritis   . Colon ulcer   . Gall stones   . Hypokalemia   . Hypothyroidism   . Pneumonia   . Sickle cell anemia (HCC)   . Ulcers of both lower extremities [facility])      Past Surgical History:  Procedure Laterality Date  . ANKLE SURGERY  1989 and 1990  . CEREBRAL ANEURYSM REPAIR    . COLONOSCOPY  05/12/2012   Procedure: COLONOSCOPY;  Surgeon: Hart Carwin, MD;  Location: [facility];  Service: Endoscopy;  Laterality: N/A;  . ESOPHAGOGASTRODUODENOSCOPY  05/12/2012   Procedure: ESOPHAGOGASTRODUODENOSCOPY (EGD);  Surgeon: Hart Carwin, MD;  Location: [facility];  Service: Endoscopy;  Laterality: N/A;  . gall stone removal    . GIVENS CAPSULE STUDY  05/13/2012  Procedure: GIVENS CAPSULE STUDY;  Surgeon: Hart Carwin, MD;  [facility]: [facility];  Service: Endoscopy;  Laterality: N/A;   Family History  Problem Relation Age of Onset  . Cancer Mother 7       Esophageal  . Sickle cell trait Mother   . Sickle cell trait Father   . HIV/AIDS Brother    Social History   Tobacco Use  . Smoking status: Never Smoker  . Smokeless tobacco: Never Used  Vaping Use  . Vaping Use: Never used  Substance Use Topics  . Alcohol use: Yes    Comment: Occasional  . Drug use: No   Current Outpatient Medications  Medication Sig Dispense  Refill  . acetaminophen (TYLENOL) 650 MG CR tablet Take 650 mg by mouth at bedtime as needed for pain.    Marland Kitchen ascorbic acid (VITAMIN C) 500 MG tablet Take 500-1,000 mg by mouth daily.    . Calcium Citrate-Vitamin D (CITRACAL + D PO) Take 1 tablet by mouth daily.     . folic acid (FOLVITE) 400 MCG tablet Take 800 mcg by mouth in the morning and at bedtime.     . gabapentin (NEURONTIN) 300 MG capsule Take 300 mg by mouth daily.    . Ledipasvir-Sofosbuvir 90-400 MG TABS Take 1 tablet by mouth daily. 28 tablet 2  . levothyroxine (SYNTHROID) 175 MCG tablet Take 175 mcg by mouth daily before breakfast.    . montelukast (SINGULAIR) 10 MG tablet Take 10 mg by mouth at bedtime.    Marland Kitchen omega-3 acid ethyl esters (LOVAZA) 1 g capsule Take 1 g by mouth daily.    Marland Kitchen oxyCODONE-acetaminophen (PERCOCET/ROXICET) 5-325 MG tablet Take 1 tablet by mouth every 6 (six) hours as needed for severe pain.     No current facility-administered medications for this visit.   Allergies  Allergen Reactions  . Ampicillin Other (See Comments)    Caused red bumps on the skin, but nothing else Per patient, she tolerates cephalosporins without problems (??)  . Demerol [Meperidine] Other (See Comments)    A high(er) dose caused seizure(s)     Review of Systems: Positive for heart murmur.  All other systems reviewed and negative except where noted in HPI.   PHYSICAL EXAM :    Wt Readings from Last 3 Encounters:  02/26/20 150 lb (68 kg)  02/24/20 150 lb (68 kg)  01/07/20 151 lb (68.5 kg)    BP 128/72   Pulse 62   Ht 5\' 11"  (1.803 m)   Wt 153 lb 2 oz (69.5 kg)   BMI 21.36 kg/m  Constitutional:  Pleasant female in no acute distress. Psychiatric: Normal mood and affect. Behavior is normal. EENT: Pupils normal.  Conjunctivae are normal. No scleral icterus. Neck supple.  Cardiovascular: Normal rate, regular rhythm. No edema Pulmonary/chest: Effort normal and breath sounds normal. No wheezing, rales or  rhonchi. Abdominal: Soft, nondistended, nontender. Bowel sounds active throughout. There are no masses palpable. No hepatomegaly. Neurological: Alert and oriented to person place and time. Skin: Skin is warm and dry. No rashes noted.  , NP  04/12/2020, 1:17 PM  Cc:  Referring Provider 04/14/2020., FNP

## 2020-04-21 ENCOUNTER — Telehealth: Payer: Self-pay | Admitting: Internal Medicine

## 2020-04-21 ENCOUNTER — Encounter: Payer: Medicare HMO | Admitting: Internal Medicine

## 2020-04-21 ENCOUNTER — Telehealth: Payer: Self-pay | Admitting: *Deleted

## 2020-04-21 ENCOUNTER — Institutional Professional Consult (permissible substitution): Payer: Medicare HMO | Admitting: Emergency Medicine

## 2020-04-21 NOTE — Telephone Encounter (Signed)
Noted  

## 2020-04-21 NOTE — Telephone Encounter (Signed)
Hi Dr. Rhea Belton,  FYI: This patient just canceled the procedure for 04/22/2020 due to transportation.  Thank you

## 2020-04-21 NOTE — Telephone Encounter (Signed)
Pt no showed for her COVID test on 9/28. Called to speak with the patient to see if she could go today but there was no answer and no VM available. Will route this to Dr. Rhea Belton.

## 2020-04-22 ENCOUNTER — Encounter: Payer: Medicare HMO | Admitting: Internal Medicine

## 2020-04-22 NOTE — Progress Notes (Signed)
Addendum: Reviewed and agree with assessment and management plan. Slayter Moorhouse M, MD  

## 2020-04-25 ENCOUNTER — Ambulatory Visit: Payer: Medicare HMO | Admitting: Family

## 2020-04-28 ENCOUNTER — Other Ambulatory Visit: Payer: Self-pay

## 2020-04-28 ENCOUNTER — Encounter: Payer: Self-pay | Admitting: Family

## 2020-04-28 ENCOUNTER — Ambulatory Visit (INDEPENDENT_AMBULATORY_CARE_PROVIDER_SITE_OTHER): Payer: Medicare HMO | Admitting: Family

## 2020-04-28 VITALS — BP 153/71 | HR 67 | Wt 151.0 lb

## 2020-04-28 DIAGNOSIS — B182 Chronic viral hepatitis C: Secondary | ICD-10-CM

## 2020-04-28 NOTE — Assessment & Plan Note (Signed)
[name] is a 59 year old female with genotype 1b chronic hepatitis C with initial viral load of 218,000 and fibrosis score of F4 with compensated cirrhosis who is completing 12 weeks of Harvoni with most recent viral load being undetectable.  Recheck blood work today.  Discussed the plan of care to include blood work today followed by repeat blood work in 3 months to determine sustained viremic response.  She does have scleral icterus noted today and will check hepatic panel.  Complete Harvoni as prescribed and plan for follow-up in 3 months or sooner if needed.

## 2020-04-28 NOTE — Progress Notes (Signed)
Subjective:    Patient ID: [name], female    DOB: 1960/08/07, 59 y.o.   MRN: [id number]  Chief Complaint  Patient presents with  . Follow-up    No questions or concerns     HPI:  [name] is a 59 y.o. female with chronic hepatitis C who was last seen on 02/11/2020 by Aggie Cosier, CPP, PharmD for routine follow-up with good adherence and tolerance to her Harvoni.  Viral load at the time was found to be undetectable.  Here today for routine follow-up.  [name] has about 5 days of medication remaining and continues to take her Harvoni as prescribed with no adverse side effects.  Overall feeling okay with occasional abdominal pains described as cramping at times.  She is scheduled for colonoscopy.   Allergies  Allergen Reactions  . Ampicillin Other (See Comments)    Caused red bumps on the skin, but nothing else Per patient, she tolerates cephalosporins without problems (??)  . Demerol [Meperidine] Other (See Comments)    A high(er) dose caused seizure(s)      Outpatient Medications Prior to Visit  Medication Sig Dispense Refill  . acetaminophen (TYLENOL) 650 MG CR tablet Take 650 mg by mouth at bedtime as needed for pain.    Marland Kitchen albuterol (VENTOLIN HFA) 108 (90 Base) MCG/ACT inhaler     . ascorbic acid (VITAMIN C) 500 MG tablet Take 500-1,000 mg by mouth daily.    . Calcium Citrate-Vitamin D (CITRACAL + D PO) Take 1 tablet by mouth daily.     . folic acid (FOLVITE) 400 MCG tablet Take 800 mcg by mouth in the morning and at bedtime.     . gabapentin (NEURONTIN) 300 MG capsule Take 300 mg by mouth daily.    . Ledipasvir-Sofosbuvir 90-400 MG TABS Take 1 tablet by mouth daily. 28 tablet 2  . levothyroxine (SYNTHROID) 175 MCG tablet Take 175 mcg by mouth daily before breakfast.    . montelukast (SINGULAIR) 10 MG tablet Take 10 mg by mouth at bedtime.    Marland Kitchen oxyCODONE-acetaminophen (PERCOCET/ROXICET) 5-325 MG tablet Take 1 tablet by mouth every 6 (six) hours as needed for  severe pain. (Patient not taking: Reported on 04/28/2020)     No facility-administered medications prior to visit.     Past Medical History:  Diagnosis Date  . Aneurysm (HCC)   . Anxiety   . Arthritis   . Colon ulcer   . Gall stones   . Hypokalemia   . Hypothyroidism   . Pneumonia   . Sickle cell anemia (HCC)   . Ulcers of both lower extremities [facility])      Past Surgical History:  Procedure Laterality Date  . ANKLE SURGERY  1989 and 1990  . CEREBRAL ANEURYSM REPAIR    . COLONOSCOPY  05/12/2012   Procedure: COLONOSCOPY;  Surgeon: Hart Carwin, MD;  Location: [facility];  Service: Endoscopy;  Laterality: N/A;  . ESOPHAGOGASTRODUODENOSCOPY  05/12/2012   Procedure: ESOPHAGOGASTRODUODENOSCOPY (EGD);  Surgeon: Hart Carwin, MD;  Location: [facility];  Service: Endoscopy;  Laterality: N/A;  . gall stone removal    . GIVENS CAPSULE STUDY  05/13/2012   Procedure: GIVENS CAPSULE STUDY;  Surgeon: Hart Carwin, MD;  Location: [facility];  Service: Endoscopy;  Laterality: N/A;       Review of Systems  Constitutional: Negative for chills, fatigue, fever and unexpected weight change.  Respiratory: Negative for cough, chest tightness, shortness of breath and wheezing.   Cardiovascular: Negative  for chest pain and leg swelling.  Gastrointestinal: Negative for abdominal distention, constipation, diarrhea, nausea and vomiting.  Neurological: Negative for dizziness, weakness, light-headedness and headaches.  Hematological: Does not bruise/bleed easily.      Objective:    BP (!) 153/71   Pulse 67   Wt 151 lb (68.5 kg)   SpO2 93%   BMI 21.06 kg/m  Nursing note and vital signs reviewed.  Physical Exam Constitutional:      General: She is not in acute distress.    Appearance: She is well-developed.  Eyes:     General: Scleral icterus present.  Cardiovascular:     Rate and Rhythm: Normal rate and regular rhythm.     Heart sounds: Normal heart sounds. No murmur heard.    No friction rub. No gallop.   Pulmonary:     Effort: Pulmonary effort is normal. No respiratory distress.     Breath sounds: Normal breath sounds. No wheezing or rales.  Chest:     Chest wall: No tenderness.  Abdominal:     General: Bowel sounds are normal. There is no distension.     Palpations: Abdomen is soft. There is no mass.     Tenderness: There is no abdominal tenderness. There is no guarding or rebound.  Skin:    General: Skin is warm and dry.  Neurological:     Mental Status: She is alert and oriented to person, place, and time.  Psychiatric:        Behavior: Behavior normal.        Thought Content: Thought content normal.        Judgment: Judgment normal.      Depression screen [facility] 2/9 12/15/2019 12/17/2017 11/18/2017 11/04/2017 08/23/2017  Decreased Interest 0 2 2 2 2   Down, Depressed, Hopeless 1 1 2 2 2   PHQ - 2 Score 1 3 4 4 4   Altered sleeping - 0 0 - 0  Tired, decreased energy - 0 2 - 2  Change in appetite - 1 0 - 0  Feeling bad or failure about yourself  - 2 - - 2  Trouble concentrating - 1 1 - 1  Moving slowly or fidgety/restless - 0 0 - 0  Suicidal thoughts - 0 0 - 0  PHQ-9 Score - 7 7 - 9       Assessment & Plan:    Patient Active Problem List   Diagnosis Date Noted  . Acute on chronic anemia 11/10/2019  . Sickle cell anemia with crisis (HCC) 11/19/2017  . Symptomatic anemia 02/06/2017  . Leg wound, right 02/06/2017  . Sickle cell crisis (HCC) 10/24/2015  . Anemia of chronic disease   . Hepatitis C 05/11/2014  . 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 Thrombophlebitis leg superficial 03/04/2013  . Rheumatoid arthritis (HCC) 12/16/2012  . Hypothyroidism 11/27/2011     Problem List Items Addressed This Visit      Digestive   Hepatitis C - Primary    [name] is a 59 year old female with genotype 1b chronic hepatitis C with initial viral load of 218,000 and fibrosis score of F4 with compensated cirrhosis who is completing 12 weeks of  Harvoni with most recent viral load being undetectable.  Recheck blood work today.  Discussed the plan of care to include blood work today followed by repeat blood work in 3 months to determine sustained viremic response.  She does have scleral icterus noted today and will check hepatic panel.  Complete Harvoni as prescribed  and plan for follow-up in 3 months or sooner if needed.      Relevant Orders   Hepatic function panel   Hepatitis C RNA quantitative   Basic metabolic panel       I am having [name] maintain her levothyroxine, folic acid, acetaminophen, ascorbic acid, Calcium Citrate-Vitamin D (CITRACAL + D PO), Ledipasvir-Sofosbuvir, oxyCODONE-acetaminophen, montelukast, gabapentin, and albuterol.   No orders of the defined types were placed in this encounter.    Follow-up: Return in about 3 months (around 07/29/2020), or if symptoms worsen or fail to improve.   Marcos Eke, MSN, FNP-C Nurse Practitioner [facility]ious [facility] Main number: [phone]

## 2020-04-28 NOTE — Patient Instructions (Signed)
Nice to see you.  We will check your blood work today.   Plan for follow up in 3 months to recheck your blood work for cure visit. Schedule an appointment for lab 1-2 weeks prior to your appointment.   We will get you on the vaccine schedule for tomorrow.  Have a great day and stay safe!

## 2020-04-29 ENCOUNTER — Ambulatory Visit: Payer: Medicare HMO

## 2020-04-30 LAB — BASIC METABOLIC PANEL
BUN: 8 mg/dL (ref 7–25)
CO2: 24 mmol/L (ref 20–32)
Calcium: 9.7 mg/dL (ref 8.6–10.4)
Chloride: 106 mmol/L (ref 98–110)
Creat: 0.7 mg/dL (ref 0.50–0.99)
Glucose, Bld: 94 mg/dL (ref 65–99)
Potassium: 3.8 mmol/L (ref 3.5–5.3)
Sodium: 138 mmol/L (ref 135–146)

## 2020-04-30 LAB — HEPATIC FUNCTION PANEL
AG Ratio: 1.2 (calc) (ref 1.0–2.5)
ALT: 12 U/L (ref 6–29)
AST: 46 U/L — ABNORMAL HIGH (ref 10–35)
Albumin: 4.1 g/dL (ref 3.6–5.1)
Alkaline phosphatase (APISO): 83 U/L (ref 37–153)
Bilirubin, Direct: 0.9 mg/dL — ABNORMAL HIGH (ref 0.0–0.2)
Globulin: 3.3 g/dL (calc) (ref 1.9–3.7)
Indirect Bilirubin: 3.7 mg/dL (calc) — ABNORMAL HIGH (ref 0.2–1.2)
Total Bilirubin: 4.6 mg/dL — ABNORMAL HIGH (ref 0.2–1.2)
Total Protein: 7.4 g/dL (ref 6.1–8.1)

## 2020-04-30 LAB — HEPATITIS C RNA QUANTITATIVE
HCV RNA, PCR, QN (Log): <1.18 log IU/mL
HCV RNA, PCR, QN: <15 IU/mL

## 2020-05-02 ENCOUNTER — Telehealth: Payer: Self-pay

## 2020-05-02 NOTE — Telephone Encounter (Signed)
Attempted to call patient to relay lab results per Marcos Eke, NP. No answer, please continue to follow.   Sandie Ano, RN

## 2020-05-02 NOTE — Telephone Encounter (Signed)
Attempted to call patient again, no answer. Please continue to follow.

## 2020-05-02 NOTE — Telephone Encounter (Signed)
-----   Message from Veryl Speak, FNP sent at 05/02/2020  8:55 AM EDT ----- Please inform Ms. Hurston that her Hepatitis C viral load is undetectable. Liver function tests are looking good. Follow up in 3 months or sooner if needed for cure visit.

## 2020-05-03 ENCOUNTER — Ambulatory Visit: Payer: Medicare HMO | Admitting: Family

## 2020-05-03 NOTE — Telephone Encounter (Signed)
Attempted to call patient with results; voicemail name does not match the patient name. Unable to leave message. Will try again later.   Brigido Mera Loyola Mast, RN

## 2020-05-06 ENCOUNTER — Other Ambulatory Visit: Payer: Self-pay

## 2020-05-06 ENCOUNTER — Ambulatory Visit (INDEPENDENT_AMBULATORY_CARE_PROVIDER_SITE_OTHER): Payer: Medicare HMO

## 2020-05-06 DIAGNOSIS — Z23 Encounter for immunization: Secondary | ICD-10-CM | POA: Diagnosis not present

## 2020-05-06 NOTE — Progress Notes (Signed)
   Covid-19 Vaccination Clinic  Name:  [name]    MRN: [id number] DOB: [birth year]  05/06/2020  [name] was observed post Covid-19 immunization for 15 minutes without incident. She was provided with Vaccine Information Sheet and instruction to access the V-Safe system.   [name] was instructed to call 911 with any severe reactions post vaccine: Marland Kitchen Difficulty breathing  . Swelling of face and throat  . A fast heartbeat  . A bad rash all over body  . Dizziness and weakness   Immunizations Administered    Name Date Dose VIS Date Route   Pfizer COVID-19 Vaccine 05/06/2020 11:26 AM 0.3 mL 09/16/2018 Intramuscular   Manufacturer: [employer], [employer]   Lot: [id number]   NDC: [address]-6

## 2020-05-06 NOTE — Telephone Encounter (Signed)
Spoke with the patient to relay undetectable Hep C viral load and that liver function tests are looking good per Marcos Eke, NP. Patient was pleased and verbalized understanding. Reminded patient that she has a follow-up appointment in January with Tammy Sours. Patient has no further questions.    Sandie Ano, RN

## 2020-05-27 ENCOUNTER — Ambulatory Visit: Payer: Medicare HMO

## 2020-06-03 ENCOUNTER — Ambulatory Visit (INDEPENDENT_AMBULATORY_CARE_PROVIDER_SITE_OTHER): Payer: Medicare HMO

## 2020-06-03 ENCOUNTER — Other Ambulatory Visit: Payer: Self-pay

## 2020-06-03 DIAGNOSIS — Z23 Encounter for immunization: Secondary | ICD-10-CM

## 2020-06-13 ENCOUNTER — Encounter: Payer: Self-pay | Admitting: Certified Registered Nurse Anesthetist

## 2020-06-13 DIAGNOSIS — I729 Aneurysm of unspecified site: Secondary | ICD-10-CM | POA: Insufficient documentation

## 2020-06-14 ENCOUNTER — Encounter: Payer: Medicare HMO | Admitting: Internal Medicine

## 2020-07-29 ENCOUNTER — Ambulatory Visit: Payer: Medicare HMO | Admitting: Family

## 2020-08-14 IMAGING — CR DG CHEST 2V
2 series · 2 of 2 positions shown · non-contrast
Comparison: Chest x-ray dated 09/09/2017 and 10/24/2015 and chest
CT dated 09/09/2017

CLINICAL DATA: Rheumatoid arthritis involving multiple sites with
positive rheumatoid factor. Immunosuppressive therapy.

EXAM:
CHEST - 2 VIEW

[chest pa]
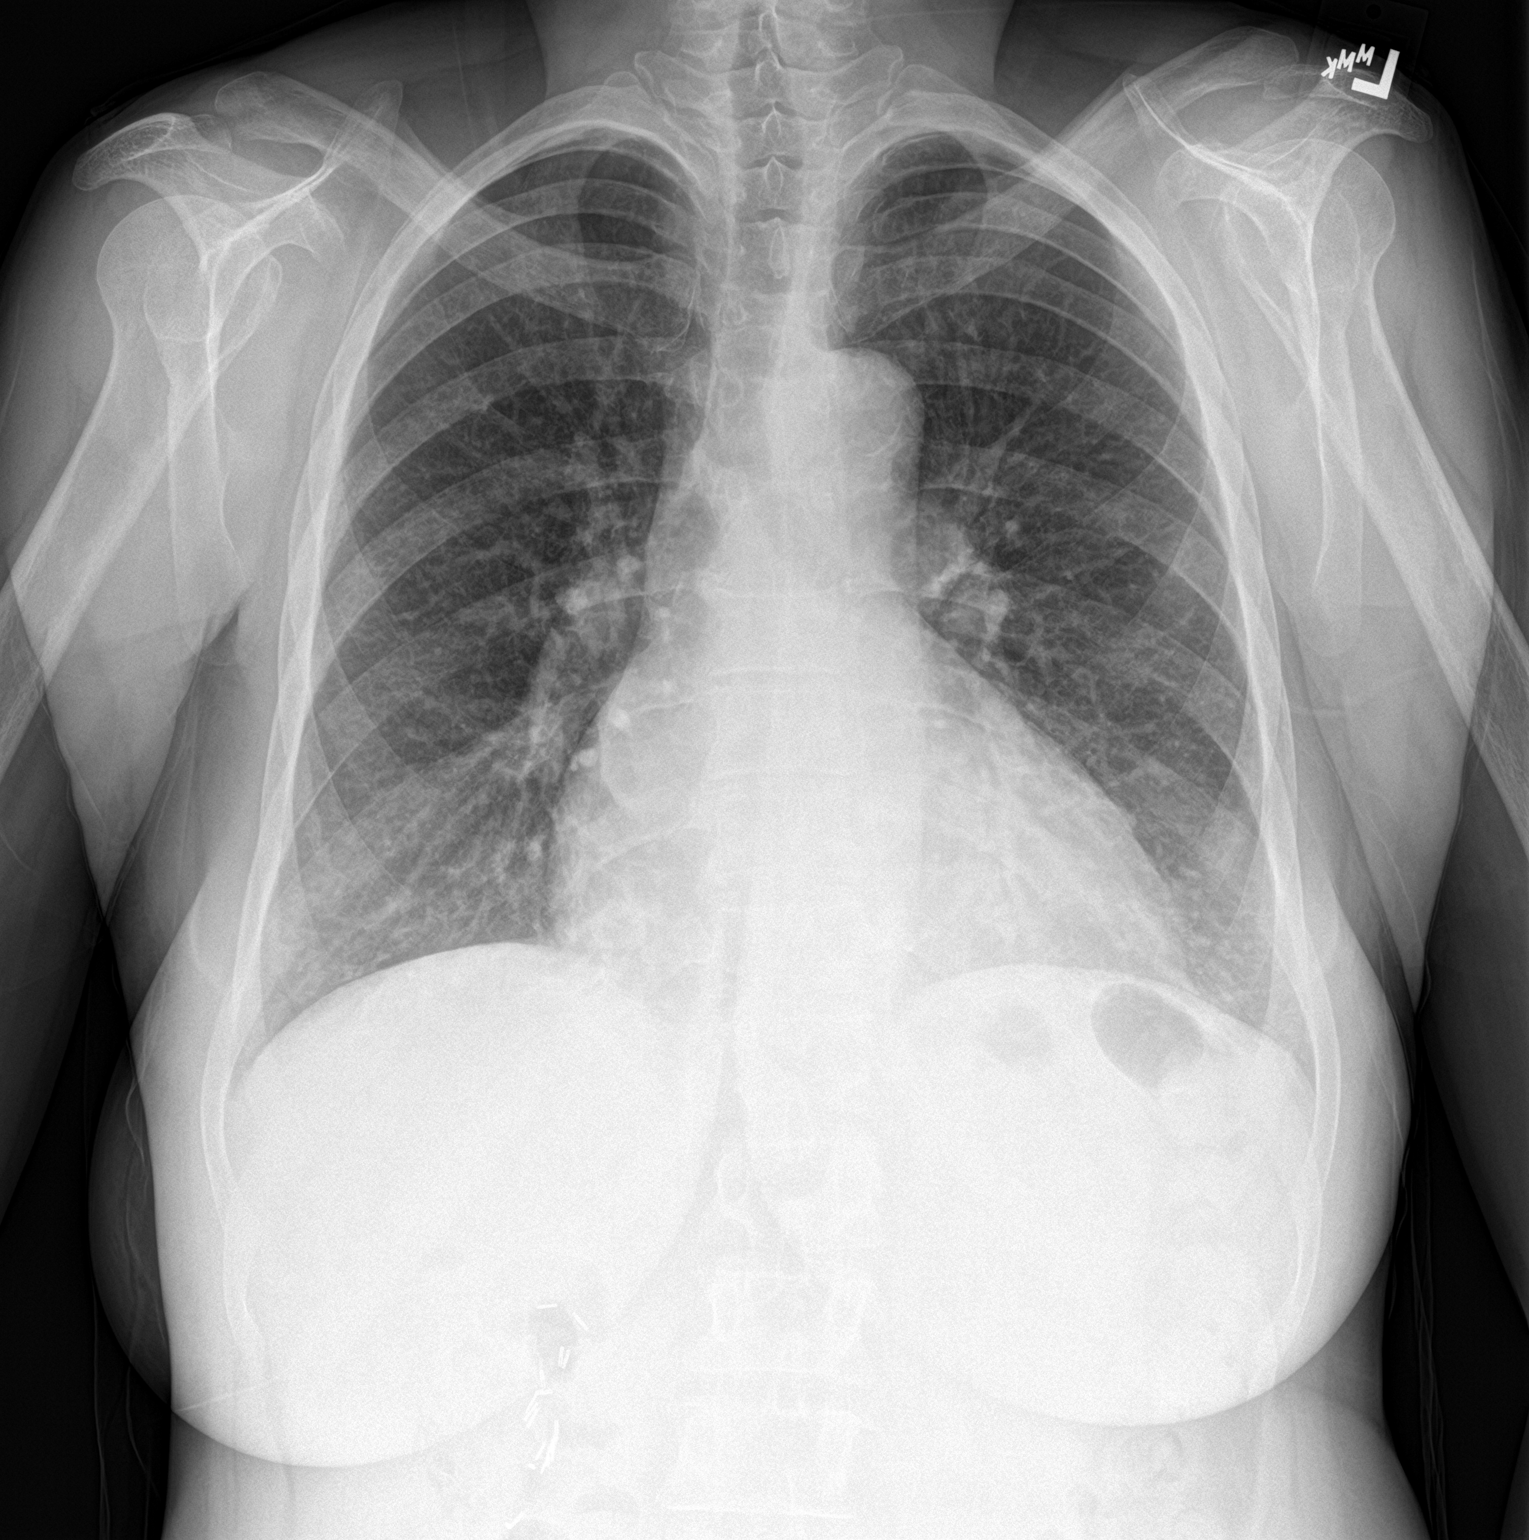

[chest lat]
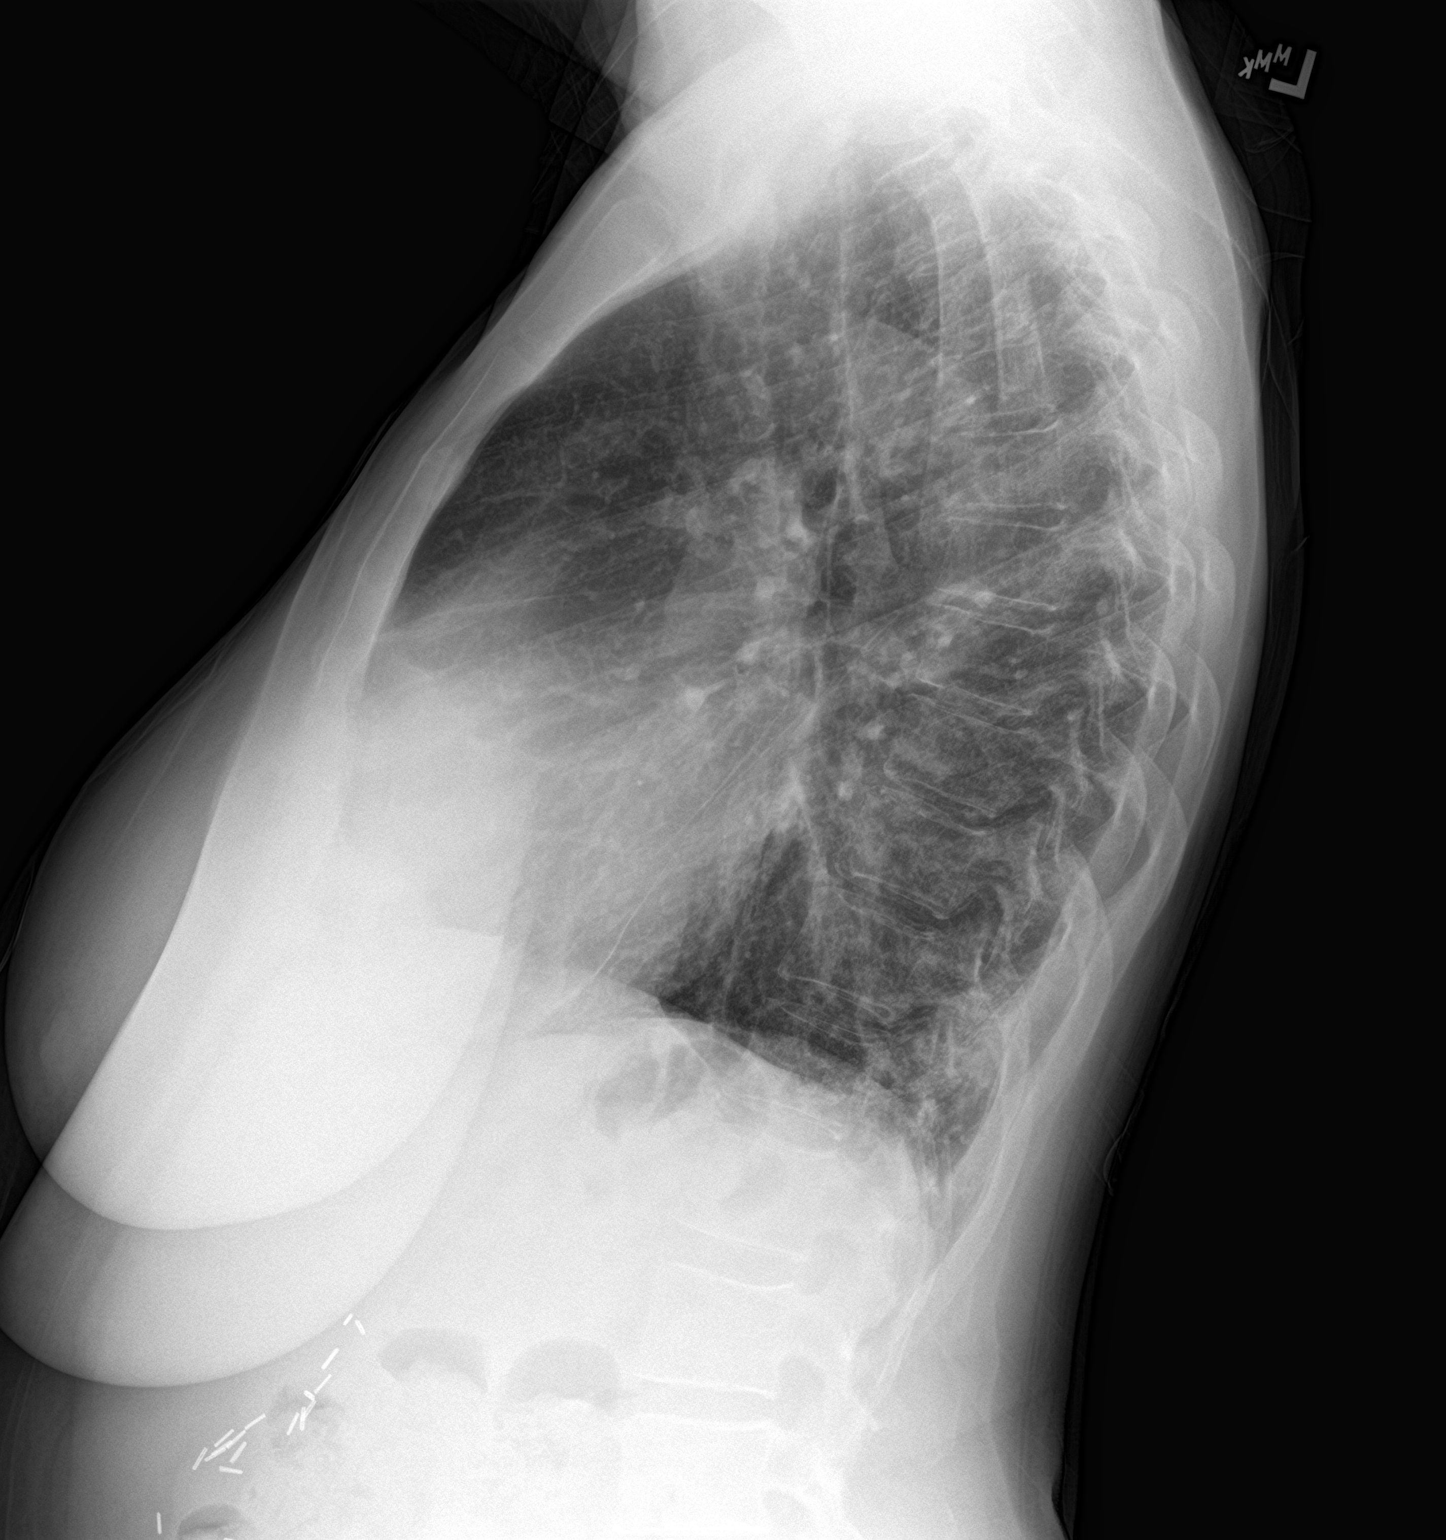

[2 of 2 positions shown; findings below may reference images not displayed]

FINDINGS: Slight cardiomegaly, unchanged. Aortic atherosclerosis. Slight
chronic accentuation of the interstitial markings at the left lung
base. No infiltrates or effusions. No significant bone abnormality.
IMPRESSION: No acute abnormalities. Chronic accentuation of the interstitial
markings at the left lung base.

## 2020-08-19 ENCOUNTER — Ambulatory Visit (INDEPENDENT_AMBULATORY_CARE_PROVIDER_SITE_OTHER): Payer: Medicare HMO | Admitting: Family

## 2020-08-19 ENCOUNTER — Encounter: Payer: Self-pay | Admitting: Family

## 2020-08-19 ENCOUNTER — Other Ambulatory Visit: Payer: Self-pay

## 2020-08-19 VITALS — BP 147/76 | HR 68 | Temp 97.9°F | Ht 71.0 in | Wt 155.0 lb

## 2020-08-19 DIAGNOSIS — B182 Chronic viral hepatitis C: Secondary | ICD-10-CM

## 2020-08-19 NOTE — Progress Notes (Signed)
Subjective:    Patient ID: [name], female    DOB: 15-Sep-1960, 60 y.o.   MRN: [id number]  Chief Complaint  Patient presents with  . Follow-up     HPI:  [name] is a 60 y.o. female is with Genotype 1b Chronic Hepatitis C who was last seen on 04/28/20 nearing the completion of treatment with Harvoni. Hepatitis C RNA level at the time was undetectable. Here today for routine follow up.  [name] has been doing well with no current symptoms. She has several questions regarding post-treatment care.    Allergies  Allergen Reactions  . Ampicillin Other (See Comments)    Caused red bumps on the skin, but nothing else Per patient, she tolerates cephalosporins without problems (??)  . Demerol [Meperidine] Other (See Comments)    A high(er) dose caused seizure(s)      Outpatient Medications Prior to Visit  Medication Sig Dispense Refill  . acetaminophen (TYLENOL) 650 MG CR tablet Take 650 mg by mouth at bedtime as needed for pain.    Marland Kitchen albuterol (VENTOLIN HFA) 108 (90 Base) MCG/ACT inhaler     . ascorbic acid (VITAMIN C) 500 MG tablet Take 500-1,000 mg by mouth daily.    . Calcium Citrate-Vitamin D (CITRACAL + D PO) Take 1 tablet by mouth daily.     . folic acid (FOLVITE) 400 MCG tablet Take 800 mcg by mouth in the morning and at bedtime.     . gabapentin (NEURONTIN) 300 MG capsule Take 300 mg by mouth daily.    Marland Kitchen levothyroxine (SYNTHROID) 175 MCG tablet Take 175 mcg by mouth daily before breakfast.    . montelukast (SINGULAIR) 10 MG tablet Take 10 mg by mouth at bedtime.    Marland Kitchen omega-3 acid ethyl esters (LOVAZA) 1 g capsule     . cyclobenzaprine (FLEXERIL) 10 MG tablet  (Patient not taking: Reported on 08/19/2020)    . oxyCODONE-acetaminophen (PERCOCET/ROXICET) 5-325 MG tablet Take 1 tablet by mouth every 6 (six) hours as needed for severe pain. (Patient not taking: No sig reported)    . Ledipasvir-Sofosbuvir 90-400 MG TABS Take 1 tablet by mouth daily. (Patient not taking:  Reported on 08/19/2020) 28 tablet 2   No facility-administered medications prior to visit.     Past Medical History:  Diagnosis Date  . Aneurysm (HCC)   . Anxiety   . Arthritis   . Colon ulcer   . Gall stones   . Hypokalemia   . Hypothyroidism   . Pneumonia   . Sickle cell anemia (HCC)   . Ulcers of both lower extremities [facility])      Past Surgical History:  Procedure Laterality Date  . ANKLE SURGERY  1989 and 1990  . CEREBRAL ANEURYSM REPAIR    . COLONOSCOPY  05/12/2012   Procedure: COLONOSCOPY;  Surgeon: Hart Carwin, MD;  Location: [facility];  Service: Endoscopy;  Laterality: N/A;  . ESOPHAGOGASTRODUODENOSCOPY  05/12/2012   Procedure: ESOPHAGOGASTRODUODENOSCOPY (EGD);  Surgeon: Hart Carwin, MD;  Location: [facility];  Service: Endoscopy;  Laterality: N/A;  . gall stone removal    . GIVENS CAPSULE STUDY  05/13/2012   Procedure: GIVENS CAPSULE STUDY;  Surgeon: Hart Carwin, MD;  Location: [facility];  Service: Endoscopy;  Laterality: N/A;       Review of Systems  Constitutional: Negative for chills, fatigue, fever and unexpected weight change.  Respiratory: Negative for cough, chest tightness, shortness of breath and wheezing.   Cardiovascular: Negative for chest pain  and leg swelling.  Gastrointestinal: Negative for abdominal distention, constipation, diarrhea, nausea and vomiting.  Neurological: Negative for dizziness, weakness, light-headedness and headaches.  Hematological: Does not bruise/bleed easily.      Objective:    BP (!) 147/76   Pulse 68   Temp 97.9 F (36.6 C) (Oral)   Ht 5\' 11"  (1.803 m)   Wt 155 lb (70.3 kg)   SpO2 94%   BMI 21.62 kg/m  Nursing note and vital signs reviewed.  Physical Exam Constitutional:      General: She is not in acute distress.    Appearance: She is well-developed.  Cardiovascular:     Rate and Rhythm: Normal rate and regular rhythm.     Pulses: Intact distal pulses.     Heart sounds: Normal heart sounds.  No murmur heard. No friction rub. No gallop.   Pulmonary:     Effort: Pulmonary effort is normal. No respiratory distress.     Breath sounds: Normal breath sounds. No wheezing or rales.  Chest:     Chest wall: No tenderness.  Abdominal:     General: Bowel sounds are normal. There is no distension or ascites.     Palpations: Abdomen is soft. There is no hepatosplenomegaly or mass.     Tenderness: There is no abdominal tenderness. There is no guarding or rebound.  Skin:    General: Skin is warm and dry.  Neurological:     Mental Status: She is alert and oriented to person, place, and time.  Psychiatric:        Mood and Affect: Mood and affect normal.        Behavior: Behavior normal.        Thought Content: Thought content normal.        Judgment: Judgment normal.      Depression screen [facility] 2/9 08/19/2020 12/15/2019 12/17/2017 11/18/2017 11/04/2017  Decreased Interest 3 0 2 2 2   Down, Depressed, Hopeless 3 1 1 2 2   PHQ - 2 Score 6 1 3 4 4   Altered sleeping 0 - 0 0 -  Tired, decreased energy 3 - 0 2 -  Change in appetite 0 - 1 0 -  Feeling bad or failure about yourself  3 - 2 - -  Trouble concentrating 3 - 1 1 -  Moving slowly or fidgety/restless 2 - 0 0 -  Suicidal thoughts 0 - 0 0 -  PHQ-9 Score 17 - 7 7 -  Difficult doing work/chores Very difficult - - - -       Assessment & Plan:    Patient Active Problem List   Diagnosis Date Noted  . Aneurysm (HCC)   . Acute on chronic anemia 11/10/2019  . Sickle cell anemia with crisis (HCC) 11/19/2017  . Symptomatic anemia 02/06/2017  . Leg wound, right 02/06/2017  . Sickle cell crisis (HCC) 10/24/2015  . Anemia of chronic disease   . Hepatitis C 05/11/2014  . 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 Thrombophlebitis leg superficial 03/04/2013  . Rheumatoid arthritis (HCC) 12/16/2012  . Hypothyroidism 11/27/2011     Problem List Items Addressed This Visit      Digestive   Hepatitis C - Primary    [name]  completed treatment with Harvoni for genotype 1b chronic hepatitis C with fibrosis score of F4.  Currently without symptoms.  Recheck hepatitis C RNA level today.  Discussed need for routine hepatocellular carcinoma screenings every 6 to 12 months through ultrasound with or without  alpha-fetoprotein which can be done with her primary care provider.  If viral load is undetectable no further treatment is necessary as hepatitis C is cured.  If additional screening is necessary in the future hepatitis C RNA level will need to be checked as the hepatitis C antibody will always be positive.  Plan for follow-up as needed pending blood work results.      Relevant Orders   Hepatitis C RNA quantitative   COMPLETE METABOLIC PANEL WITH GFR       I have discontinued Rhaya Drummer's Ledipasvir-Sofosbuvir. I am also having her maintain her levothyroxine, folic acid, acetaminophen, ascorbic acid, Calcium Citrate-Vitamin D (CITRACAL + D PO), oxyCODONE-acetaminophen, montelukast, gabapentin, albuterol, cyclobenzaprine, and omega-3 acid ethyl esters.   Follow-up: Return if symptoms worsen or fail to improve.   Marcos Eke, MSN, FNP-C Nurse Practitioner [facility]ious [facility] Main number: [phone]

## 2020-08-19 NOTE — Patient Instructions (Addendum)
Nice to see you.  We will check your lab work today.  Recommend routine liver cancer screenings every 6-12 months through ultrasound with/without lab work.   No further treatment for Hepatitis C is needed if testing is negative. If you need to be tested in the future you will need to check your viral load.   Please call [facility] if you decide to seek counseling at [phone]  Let us know if you have any questions.  Have a great day and stay safe!

## 2020-08-19 NOTE — Assessment & Plan Note (Signed)
Ms. Mulvihill completed treatment with Harvoni for genotype 1b chronic hepatitis C with fibrosis score of F4.  Currently without symptoms.  Recheck hepatitis C RNA level today.  Discussed need for routine hepatocellular carcinoma screenings every 6 to 12 months through ultrasound with or without alpha-fetoprotein which can be done with her primary care provider.  If viral load is undetectable no further treatment is necessary as hepatitis C is cured.  If additional screening is necessary in the future hepatitis C RNA level will need to be checked as the hepatitis C antibody will always be positive.  Plan for follow-up as needed pending blood work results.

## 2020-08-24 LAB — COMPLETE METABOLIC PANEL WITH GFR
AG Ratio: 1.1 (calc) (ref 1.0–2.5)
ALT: 13 U/L (ref 6–29)
AST: 44 U/L — ABNORMAL HIGH (ref 10–35)
Albumin: 3.9 g/dL (ref 3.6–5.1)
Alkaline phosphatase (APISO): 87 U/L (ref 37–153)
BUN: 8 mg/dL (ref 7–25)
CO2: 25 mmol/L (ref 20–32)
Calcium: 9.6 mg/dL (ref 8.6–10.4)
Chloride: 107 mmol/L (ref 98–110)
Creat: 0.64 mg/dL (ref 0.50–0.99)
GFR, Est African American: 112 mL/min/{1.73_m2} (ref >=60)
GFR, Est Non African American: 97 mL/min/{1.73_m2} (ref >=60)
Globulin: 3.5 g/dL (calc) (ref 1.9–3.7)
Glucose, Bld: 89 mg/dL (ref 65–99)
Potassium: 4.3 mmol/L (ref 3.5–5.3)
Sodium: 139 mmol/L (ref 135–146)
Total Bilirubin: 3.2 mg/dL — ABNORMAL HIGH (ref 0.2–1.2)
Total Protein: 7.4 g/dL (ref 6.1–8.1)

## 2020-08-24 LAB — HEPATITIS C RNA QUANTITATIVE
HCV Quantitative Log: <1.18 log IU/mL
HCV RNA, PCR, QN: <15 IU/mL

## 2020-08-29 ENCOUNTER — Telehealth: Payer: Self-pay

## 2020-08-29 NOTE — Telephone Encounter (Signed)
RN spoke with patient to let her know per Marcos Eke, NP that her hepatitis C viral load was undetectable, meaning she will not need any additional treatment. Advised her that Tammy Sours would like for her to continue follow up for liver cancer screenings with her primary care provider as they discussed, but that she will not need to follow up with [place]. Patient verbalized understanding and has no further questions.   Sandie Ano, RN

## 2020-08-29 NOTE — Telephone Encounter (Signed)
-----   Message from Veryl Speak, FNP sent at 08/29/2020  3:25 PM EST ----- Please inform [name] that her Hepatitis C RNA was undetectable meaning she does not need any additional treatment. Recommend to continue to follow up for liver cancer screenings with her PCP as we discussed. No follow up needed.

## 2020-11-07 ENCOUNTER — Encounter (HOSPITAL_BASED_OUTPATIENT_CLINIC_OR_DEPARTMENT_OTHER): Payer: Medicare HMO | Attending: Internal Medicine | Admitting: Internal Medicine

## 2020-11-07 ENCOUNTER — Other Ambulatory Visit: Payer: Self-pay

## 2020-11-07 DIAGNOSIS — Z87891 Personal history of nicotine dependence: Secondary | ICD-10-CM | POA: Insufficient documentation

## 2020-11-07 DIAGNOSIS — Z8249 Family history of ischemic heart disease and other diseases of the circulatory system: Secondary | ICD-10-CM | POA: Insufficient documentation

## 2020-11-07 DIAGNOSIS — D571 Sickle-cell disease without crisis: Secondary | ICD-10-CM | POA: Diagnosis not present

## 2020-11-07 DIAGNOSIS — M069 Rheumatoid arthritis, unspecified: Secondary | ICD-10-CM | POA: Diagnosis not present

## 2020-11-07 DIAGNOSIS — L97819 Non-pressure chronic ulcer of other part of right lower leg with unspecified severity: Secondary | ICD-10-CM

## 2020-11-07 DIAGNOSIS — I872 Venous insufficiency (chronic) (peripheral): Secondary | ICD-10-CM | POA: Insufficient documentation

## 2020-11-07 DIAGNOSIS — L97919 Non-pressure chronic ulcer of unspecified part of right lower leg with unspecified severity: Secondary | ICD-10-CM | POA: Diagnosis present

## 2020-11-11 ENCOUNTER — Encounter (HOSPITAL_BASED_OUTPATIENT_CLINIC_OR_DEPARTMENT_OTHER): Payer: Medicare HMO | Admitting: Internal Medicine

## 2020-11-14 ENCOUNTER — Other Ambulatory Visit: Payer: Self-pay

## 2020-11-14 ENCOUNTER — Encounter (HOSPITAL_BASED_OUTPATIENT_CLINIC_OR_DEPARTMENT_OTHER): Payer: Medicare HMO | Admitting: Internal Medicine

## 2020-11-14 DIAGNOSIS — L97819 Non-pressure chronic ulcer of other part of right lower leg with unspecified severity: Secondary | ICD-10-CM | POA: Diagnosis not present

## 2020-11-14 DIAGNOSIS — I872 Venous insufficiency (chronic) (peripheral): Secondary | ICD-10-CM
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
## 2020-11-21 ENCOUNTER — Encounter (HOSPITAL_BASED_OUTPATIENT_CLINIC_OR_DEPARTMENT_OTHER): Payer: Medicare HMO | Attending: Internal Medicine | Admitting: Internal Medicine

## 2020-11-25 ENCOUNTER — Encounter (HOSPITAL_BASED_OUTPATIENT_CLINIC_OR_DEPARTMENT_OTHER): Payer: Medicare HMO | Attending: Internal Medicine | Admitting: Internal Medicine

## 2020-11-25 ENCOUNTER — Other Ambulatory Visit: Payer: Self-pay

## 2020-11-25 DIAGNOSIS — M069 Rheumatoid arthritis, unspecified: Secondary | ICD-10-CM | POA: Diagnosis not present

## 2020-11-25 DIAGNOSIS — L97812 Non-pressure chronic ulcer of other part of right lower leg with fat layer exposed: Secondary | ICD-10-CM | POA: Diagnosis not present

## 2020-11-25 DIAGNOSIS — L97819 Non-pressure chronic ulcer of other part of right lower leg with unspecified severity: Secondary | ICD-10-CM

## 2020-11-25 DIAGNOSIS — B182 Chronic viral hepatitis C: Secondary | ICD-10-CM | POA: Insufficient documentation

## 2020-11-25 DIAGNOSIS — I872 Venous insufficiency (chronic) (peripheral): Secondary | ICD-10-CM | POA: Insufficient documentation

## 2020-11-25 DIAGNOSIS — E039 Hypothyroidism, unspecified: Secondary | ICD-10-CM | POA: Insufficient documentation

## 2020-11-25 DIAGNOSIS — D571 Sickle-cell disease without crisis: Secondary | ICD-10-CM | POA: Diagnosis not present
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
## 2020-11-25 NOTE — Progress Notes (Signed)
ROSELINA, BURGUENO ([id number]) Visit Report for 11/25/2020 Arrival Information Details Patient Name: Date of Service: Angela Burke EDA 11/25/2020 10:15 A M Medical Record Number: [id number] Patient Account Number: [id number] Date of Birth/Sex: Treating RN: 21-Apr-1961 (60 y.o. Female) Rhae Hammock Primary Care Fread Kottke: Dustin Folks Other Clinician: Referring Kerstyn Coryell: Treating Shene Maxfield/Extender: Catalina Pizza in Treatment: 2 Visit Information History Since Last Visit Added or deleted any medications: No Patient Arrived: Ambulatory Any new allergies or adverse reactions: No Arrival Time: 10:33 Had a fall or experienced change in No Accompanied By: self activities of daily living that may affect Transfer Assistance: None risk of falls: Patient Identification Verified: Yes Signs or symptoms of abuse/neglect since last visito No Secondary Verification Process Completed: Yes Hospitalized since last visit: No Patient Requires Transmission-Based Precautions: No Implantable device outside of the clinic excluding No Patient Has Alerts: No cellular tissue based products placed in the center since last visit: Has Dressing in Place as Prescribed: Yes Has Compression in Place as Prescribed: Yes Pain Present Now: No Electronic Signature(s) Signed: 11/25/2020 3:14:58 PM By: Rhae Hammock RN Entered By: Rhae Hammock on 11/25/2020 10:33:54 -------------------------------------------------------------------------------- Encounter Discharge Information Details Patient Name: Date of Service: [name], [name] 11/25/2020 10:15 A M Medical Record Number: [id number] Patient Account Number: [id number] Date of Birth/Sex: Treating RN: 1961-03-26 (60 y.o. Female) Rhae Hammock Primary Care Ameera Tigue: Dustin Folks Other Clinician: Referring Ramsie Ostrander: Treating Khasir Woodrome/Extender: Catalina Pizza in Treatment: 2 Encounter Discharge Information Items Post Procedure  Vitals Discharge Condition: Stable Temperature (F): 97.4 Ambulatory Status: Ambulatory Pulse (bpm): 74 Discharge Destination: Home Respiratory Rate (breaths/min): 17 Transportation: Private Auto Blood Pressure (mmHg): 147/74 Accompanied By: self Schedule Follow-up Appointment: Yes Clinical Summary of Care: Patient Declined Electronic Signature(s) Signed: 11/25/2020 3:14:58 PM By: Rhae Hammock RN Entered By: Rhae Hammock on 11/25/2020 11:10:54 -------------------------------------------------------------------------------- Lower Extremity Assessment Details Patient Name: Date of Service: [name], [name] 11/25/2020 10:15 A M Medical Record Number: [id number] Patient Account Number: [id number] Date of Birth/Sex: Treating RN: 10-04-1960 (60 y.o. Female) Rhae Hammock Primary Care Emelly Wurtz: Dustin Folks Other Clinician: Referring Aliveah Gallant: Treating Evangela Heffler/Extender: Catalina Pizza in Treatment: 2 Edema Assessment Assessed: [Left: No] [Right: Yes] Edema: [Left: Ye] [Right: s] Calf Left: Right: Point of Measurement: 37 cm From Medial Instep 30 cm Ankle Left: Right: Point of Measurement: 7 cm From Medial Instep 20.5 cm Vascular Assessment Pulses: Dorsalis Pedis Palpable: [Right:Yes] Posterior Tibial Palpable: [Right:Yes] Electronic Signature(s) Signed: 11/25/2020 3:14:58 PM By: Rhae Hammock RN Entered By: Rhae Hammock on 11/25/2020 10:36:01 -------------------------------------------------------------------------------- Multi Wound Chart Details Patient Name: Date of Service: [name], [name] 11/25/2020 10:15 A M Medical Record Number: [id number] Patient Account Number: [id number] Date of Birth/Sex: Treating RN: October 17, 1960 (60 y.o. Female) Lorrin Jackson Primary Care Katja Blue: Dustin Folks Other Clinician: Referring Thanos Cousineau: Treating Emoree Sasaki/Extender: Catalina Pizza in Treatment: 2 Vital Signs Height(in):  71 Pulse(bpm): 78 Weight(lbs): 200 Blood Pressure(mmHg): 141/72 Body Mass Index(BMI): 28 Temperature(F): 98.2 Respiratory Rate(breaths/min): 17 Photos: [3:No Photos Right, Medial Lower Leg] [N/A:N/A N/A] Wound Location: [3:Gradually Appeared] [N/A:N/A] Wounding Event: [3:Venous Leg Ulcer] [N/A:N/A] Primary Etiology: [3:Cataracts, Anemia, Sickle Cell] [N/A:N/A] Comorbid History: [3:Disease, Hepatitis C, Rheumatoid Arthritis 07/23/2020] [N/A:N/A] Date Acquired: [3:2] [N/A:N/A] Weeks of Treatment: [3:Open] [N/A:N/A] Wound Status: [3:2x1x0.1] [N/A:N/A] Measurements L x W x D (cm) [3:1.571] [N/A:N/A] A (cm) : rea [3:0.157] [N/A:N/A] Volume (cm) : [3:55.50%] [N/A:N/A] % Reduction in Area: [3:77.80%] [N/A:N/A] % Reduction in Volume: [3:Full Thickness With Exposed Support] [N/A:N/A]  Classification: [3:Structures Medium] [N/A:N/A] Exudate A mount: [3:Serosanguineous] [N/A:N/A] Exudate Type: [3:red, brown] [N/A:N/A] Exudate Color: [3:Distinct, outline attached] [N/A:N/A] Wound Margin: [3:Large (67-100%)] [N/A:N/A] Granulation A mount: [3:Red, Pink] [N/A:N/A] Granulation Quality: [3:Small (1-33%)] [N/A:N/A] Necrotic A mount: [3:Tendon: Yes] [N/A:N/A] Exposed Structures: [3:Muscle: Yes Fascia: No Fat Layer (Subcutaneous Tissue): No Joint: No Bone: No Small (1-33%)] [N/A:N/A] Epithelialization: [3:Debridement - Excisional] [N/A:N/A] Debridement: Pre-procedure Verification/Time Out 10:53 [N/A:N/A] Taken: [3:Other] [N/A:N/A] Pain Control: [3:Subcutaneous, Slough] [N/A:N/A] Tissue Debrided: [3:Skin/Subcutaneous Tissue] [N/A:N/A] Level: [3:2] [N/A:N/A] Debridement A (sq cm): [3:rea Curette] [N/A:N/A] Instrument: [3:Minimum] [N/A:N/A] Bleeding: [3:Pressure] [N/A:N/A] Hemostasis A chieved: [3:Procedure was tolerated well] [N/A:N/A] Debridement Treatment Response: [3:2x1x0.1] [N/A:N/A] Post Debridement Measurements L x W x D (cm) [3:0.157] [N/A:N/A] Post Debridement Volume: (cm)  [3:Debridement] [N/A:N/A] Treatment Notes Wound #3 (Lower Leg) Wound Laterality: Right, Medial Cleanser Wound Cleanser Discharge Instruction: Cleanse the wound with wound cleanser prior to applying a clean dressing using gauze sponges, not tissue or cotton balls. Soap and Water Discharge Instruction: May shower and wash wound with dial antibacterial soap and water prior to dressing change. Peri-Wound Care Triamcinolone 15 (g) Discharge Instruction: Use triamcinolone 15 (g)mixed with lotion to lower leg Sween Lotion (Moisturizing lotion) Discharge Instruction: Apply moisturizing lotion as directed Topical Primary Dressing Hydrofera Blue Classic Foam, 2x2 in Discharge Instruction: Moisten with saline prior to applying to wound bed Secondary Dressing Woven Gauze Sponge, Non-Sterile 4x4 in Discharge Instruction: Apply over primary dressing as directed. Secured With Compression Wrap Kerlix Roll 4.5x3.1 (in/yd) Discharge Instruction: Apply Kerlix and Coban compression as directed. Coban Self-Adherent Wrap 4x5 (in/yd) Discharge Instruction: Apply over Kerlix as directed. Compression Stockings Add-Ons Electronic Signature(s) Signed: 11/25/2020 12:19:47 PM By: Kalman Shan DO Signed: 11/25/2020 5:09:19 PM By: Fara Chute By: Kalman Shan on 11/25/2020 11:28:17 -------------------------------------------------------------------------------- Multi-Disciplinary Care Plan Details Patient Name: Date of Service: [name], [name] 11/25/2020 10:15 A M Medical Record Number: [id number] Patient Account Number: [id number] Date of Birth/Sex: Treating RN: Nov 09, 1960 (60 y.o. Female) Lorrin Jackson Primary Care Kenyatta Keidel: Dustin Folks Other Clinician: Referring Taylin Leder: Treating Tayte Mcwherter/Extender: Catalina Pizza in Treatment: 2 Active Inactive Wound/Skin Impairment Nursing Diagnoses: Impaired tissue integrity Knowledge deficit related to ulceration/compromised  skin integrity Goals: Patient will have a decrease in wound volume by X% from date: (specify in notes) Date Initiated: 11/07/2020 Date Inactivated: 11/14/2020 Target Resolution Date: 11/23/2020 Goal Status: Met Patient/caregiver will verbalize understanding of skin care regimen Date Initiated: 11/07/2020 Date Inactivated: 11/25/2020 Target Resolution Date: 11/23/2020 Goal Status: Met Ulcer/skin breakdown will have a volume reduction of 30% by week 4 Date Initiated: 11/07/2020 Date Inactivated: 11/25/2020 Target Resolution Date: 11/23/2020 Goal Status: Met Ulcer/skin breakdown will have a volume reduction of 80% by week 12 Date Initiated: 11/25/2020 Target Resolution Date: 12/23/2020 Goal Status: Active Interventions: Assess patient/caregiver ability to obtain necessary supplies Assess patient/caregiver ability to perform ulcer/skin care regimen upon admission and as needed Assess ulceration(s) every visit Provide education on ulcer and skin care Notes: Electronic Signature(s) Signed: 11/25/2020 5:09:19 PM By: Lorrin Jackson Entered By: Lorrin Jackson on 11/25/2020 11:01:24 -------------------------------------------------------------------------------- Pain Assessment Details Patient Name: Date of Service: [name], [name] 11/25/2020 10:15 A M Medical Record Number: [id number] Patient Account Number: [id number] Date of Birth/Sex: Treating RN: Oct 28, 1960 (60 y.o. Female) Rhae Hammock Primary Care Joana Nolton: Dustin Folks Other Clinician: Referring Hutchinson Isenberg: Treating Haedyn Ancrum/Extender: Catalina Pizza in Treatment: 2 Active Problems Location of Pain Severity and Description of Pain Patient Has Paino No Site Locations Rate the pain. Current Pain  Level: 0 Pain Management and Medication Current Pain Management: Electronic Signature(s) Signed: 11/25/2020 3:14:58 PM By: Rhae Hammock RN Entered By: Rhae Hammock on 11/25/2020  10:34:32 -------------------------------------------------------------------------------- Patient/Caregiver Education Details Patient Name: Date of Service: [name], [name] 5/6/2022andnbsp10:15 A M Medical Record Number: [id number] Patient Account Number: [id number] Date of Birth/Gender: Treating RN: [birth year] (60 y.o. Female) Lorrin Jackson Primary Care Physician: Dustin Folks Other Clinician: Referring Physician: Treating Physician/Extender: Catalina Pizza in Treatment: 2 Education Assessment Education Provided To: Patient Education Topics Provided Venous: Methods: Explain/Verbal, Printed Responses: State content correctly Wound/Skin Impairment: Methods: Explain/Verbal, Printed Responses: State content correctly Electronic Signature(s) Signed: 11/25/2020 5:09:19 PM By: Lorrin Jackson Entered By: Lorrin Jackson on 11/25/2020 11:00:23 -------------------------------------------------------------------------------- Wound Assessment Details Patient Name: Date of Service: [name], [name] 11/25/2020 10:15 A M Medical Record Number: [id number] Patient Account Number: [id number] Date of Birth/Sex: Treating RN: [birth year] (60 y.o. Female) Rhae Hammock Primary Care Izaha Shughart: Dustin Folks Other Clinician: Referring Ereka Brau: Treating Kelliann Pendergraph/Extender: Catalina Pizza in Treatment: 2 Wound Status Wound Number: 3 Primary Venous Leg Ulcer Etiology: Wound Location: Right, Medial Lower Leg Wound Status: Open Wounding Event: Gradually Appeared Comorbid Cataracts, Anemia, Sickle Cell Disease, Hepatitis C, Date Acquired: 07/23/2020 History: Rheumatoid Arthritis Weeks Of Treatment: 2 Clustered Wound: No Photos Wound Measurements Length: (cm) 2 Width: (cm) 1 Depth: (cm) 0.1 Area: (cm) 1.571 Volume: (cm) 0.157 % Reduction in Area: 55.5% % Reduction in Volume: 77.8% Epithelialization: Small (1-33%) Tunneling: No Undermining: No Wound  Description Classification: Full Thickness With Exposed Support Structures Wound Margin: Distinct, outline attached Exudate Amount: Medium Exudate Type: Serosanguineous Exudate Color: red, brown Foul Odor After Cleansing: No Slough/Fibrino Yes Wound Bed Granulation Amount: Large (67-100%) Exposed Structure Granulation Quality: Red, Pink Fascia Exposed: No Necrotic Amount: Small (1-33%) Fat Layer (Subcutaneous Tissue) Exposed: No Necrotic Quality: Adherent Slough Tendon Exposed: Yes Muscle Exposed: Yes Necrosis of Muscle: No Joint Exposed: No Bone Exposed: No Treatment Notes Wound #3 (Lower Leg) Wound Laterality: Right, Medial Cleanser Wound Cleanser Discharge Instruction: Cleanse the wound with wound cleanser prior to applying a clean dressing using gauze sponges, not tissue or cotton balls. Soap and Water Discharge Instruction: May shower and wash wound with dial antibacterial soap and water prior to dressing change. Peri-Wound Care Triamcinolone 15 (g) Discharge Instruction: Use triamcinolone 15 (g)mixed with lotion to lower leg Sween Lotion (Moisturizing lotion) Discharge Instruction: Apply moisturizing lotion as directed Topical Primary Dressing Hydrofera Blue Classic Foam, 2x2 in Discharge Instruction: Moisten with saline prior to applying to wound bed Secondary Dressing Woven Gauze Sponge, Non-Sterile 4x4 in Discharge Instruction: Apply over primary dressing as directed. Secured With Compression Wrap Kerlix Roll 4.5x3.1 (in/yd) Discharge Instruction: Apply Kerlix and Coban compression as directed. Coban Self-Adherent Wrap 4x5 (in/yd) Discharge Instruction: Apply over Kerlix as directed. Compression Stockings Add-Ons Electronic Signature(s) Signed: 11/25/2020 4:45:41 PM By: Sandre Kitty Signed: 11/25/2020 5:07:37 PM By: Rhae Hammock RN Previous Signature: 11/25/2020 3:14:58 PM Version By: Rhae Hammock RN Entered By: Sandre Kitty on 11/25/2020  16:25:17 -------------------------------------------------------------------------------- Vitals Details Patient Name: Date of Service: [name], [name] 11/25/2020 10:15 A M Medical Record Number: [id number] Patient Account Number: [id number] Date of Birth/Sex: Treating RN: 1960/11/12 (60 y.o. Female) Rhae Hammock Primary Care Ramil Edgington: Dustin Folks Other Clinician: Referring Daune Colgate: Treating Sidi Dzikowski/Extender: Catalina Pizza in Treatment: 2 Vital Signs Time Taken: 10:34 Temperature (F): 98.2 Height (in): 71 Pulse (bpm): 82 Weight (lbs): 200 Respiratory Rate (breaths/min): 17 Body Mass Index (BMI): 27.9 Blood  Pressure (mmHg): 141/72 Reference Range: 80 - 120 mg / dl Electronic Signature(s) Signed: 11/25/2020 3:14:58 PM By: Rhae Hammock RN Entered By: Rhae Hammock on 11/25/2020 10:34:24

## 2020-11-25 NOTE — Progress Notes (Signed)
RACHAL, DVORSKY ([id number]) Visit Report for 11/14/2020 Arrival Information Details Patient Name: Date of Service: [name], [name] 11/14/2020 2:45 PM Medical Record Number: [id number] Patient Account Number: [id number] Date of Birth/Sex: Treating RN: [birth year] (60 y.o. Tonita Phoenix, Lauren Primary Care [name]: Dustin Folks Other Clinician: Referring Shantella Blubaugh: Treating Nabria Nevin/Extender: Catalina Pizza in Treatment: 1 Visit Information History Since Last Visit Added or deleted any medications: No Patient Arrived: Ambulatory Any new allergies or adverse reactions: No Arrival Time: 15:14 Had a fall or experienced change in No Accompanied By: self activities of daily living that may affect Transfer Assistance: None risk of falls: Patient Identification Verified: Yes Signs or symptoms of abuse/neglect since last visito No Secondary Verification Process Completed: Yes Hospitalized since last visit: No Patient Requires Transmission-Based Precautions: No Implantable device outside of the clinic excluding No Patient Has Alerts: No cellular tissue based products placed in the center since last visit: Has Dressing in Place as Prescribed: Yes Has Compression in Place as Prescribed: Yes Pain Present Now: No Electronic Signature(s) Signed: 11/25/2020 3:14:58 PM By: Rhae Hammock RN Entered By: Rhae Hammock on 11/14/2020 15:14:41 -------------------------------------------------------------------------------- Clinic Level of Care Assessment Details Patient Name: Date of Service: [name], [name] 11/14/2020 2:45 PM Medical Record Number: [id number] Patient Account Number: [id number] Date of Birth/Sex: Treating RN: 1960/09/22 (60 y.o. [name] Primary Care Elmo Rio: Dustin Folks Other Clinician: Referring Vaani Morren: Treating Addalynn Kumari/Extender: Catalina Pizza in Treatment: 1 Clinic Level of Care Assessment Items TOOL 4 Quantity Score _0  -  0 Use when only an EandM is performed on FOLLOW-UP visit ASSESSMENTS - Nursing Assessment / Reassessment X- 1 10 Reassessment of Co-morbidities (includes updates in patient status) X- 1 5 Reassessment of Adherence to Treatment Plan ASSESSMENTS - Wound and Skin A ssessment / Reassessment X - Simple Wound Assessment / Reassessment - one wound 1 5 _1  - 0 Complex Wound Assessment / Reassessment - multiple wounds _2  - 0 Dermatologic / Skin Assessment (not related to wound area) ASSESSMENTS - Focused Assessment X- 1 5 Circumferential Edema Measurements - multi extremities _3  - 0 Nutritional Assessment / Counseling / Intervention X- 1 5 Lower Extremity Assessment (monofilament, tuning fork, pulses) _4  - 0 Peripheral Arterial Disease Assessment (using hand held doppler) ASSESSMENTS - Ostomy and/or Continence Assessment and Care _5  - 0 Incontinence Assessment and Management _6  - 0 Ostomy Care Assessment and Management (repouching, etc.) PROCESS - Coordination of Care X - Simple Patient / Family Education for ongoing care 1 15 _7  - 0 Complex (extensive) Patient / Family Education for ongoing care X- 1 10 Staff obtains Programmer, systems, Records, T Results / Process Orders est _8  - 0 Staff telephones HHA, Nursing Homes / Clarify orders / etc _9  - 0 Routine Transfer to another Facility (non-emergent condition) _10  - 0 Routine Hospital Admission (non-emergent condition) _11  - 0 New Admissions / Biomedical engineer / Ordering NPWT Apligraf, etc. , _12  - 0 Emergency Hospital Admission (emergent condition) X- 1 10 Simple Discharge Coordination _13  - 0 Complex (extensive) Discharge Coordination PROCESS - Special Needs _14  - 0 Pediatric / Minor Patient Management _15  - 0 Isolation Patient Management _16  - 0 Hearing / Language / Visual special needs _17  - 0 Assessment of Community assistance (transportation, D/C planning, etc.) _18  - 0 Additional assistance / Altered mentation _19  -  0 Support Surface(s) Assessment (bed, cushion, seat, etc.) INTERVENTIONS - Wound Cleansing / Measurement X - Simple Wound Cleansing - one wound 1 5 _20  - 0 Complex  Wound Cleansing - multiple wounds X- 1 5 Wound Imaging (photographs - any number of wounds) _0  - 0 Wound Tracing (instead of photographs) X- 1 5 Simple Wound Measurement - one wound _1  - 0 Complex Wound Measurement - multiple wounds INTERVENTIONS - Wound Dressings X - Small Wound Dressing one or multiple wounds 1 10 _2  - 0 Medium Wound Dressing one or multiple wounds _3  - 0 Large Wound Dressing one or multiple wounds X- 1 5 Application of Medications - topical <[id number]>_1<\/[id number]>_4  - 0 Application of Medications - injection INTERVENTIONS - Miscellaneous _5  - 0 External ear exam _6  - 0 Specimen Collection (cultures, biopsies, blood, body fluids, etc.) _7  - 0 Specimen(s) / Culture(s) sent or taken to Lab for analysis _8  - 0 Patient Transfer (multiple staff / Civil Service fast streamer / Similar devices) _9  - 0 Simple Staple / Suture removal (25 or less) _10  - 0 Complex Staple / Suture removal (26 or more) _11  - 0 Hypo / Hyperglycemic Management (close monitor of Blood Glucose) _12  - 0 Ankle / Brachial Index (ABI) - do not check if billed separately X- 1 5 Vital Signs Has the patient been seen at the hospital within the last three years: Yes Total Score: 100 Level Of Care: New/Established - Level 3 Electronic Signature(s) Signed: 11/14/2020 4:57:45 PM By: Baruch Gouty RN, BSN Entered By: Baruch Gouty on 11/14/2020 15:35:39 -------------------------------------------------------------------------------- Encounter Discharge Information Details Patient Name: Date of Service: [name], [name] 11/14/2020 2:45 PM Medical Record Number: [id number] Patient Account Number: [id number] Date of Birth/Sex: Treating RN: 01-13-1961 (60 y.o. Tonita Phoenix, Lauren Primary Care Myrle Wanek: Dustin Folks Other Clinician: Referring Dean Goldner: Treating  Ivon Roedel/Extender: Catalina Pizza in Treatment: 1 Encounter Discharge Information Items Discharge Condition: Stable Ambulatory Status: Ambulatory Discharge Destination: Home Transportation: Private Auto Accompanied By: self Schedule Follow-up Appointment: Yes Clinical Summary of Care: Patient Declined Electronic Signature(s) Signed: 11/25/2020 3:14:58 PM By: Rhae Hammock RN Entered By: Rhae Hammock on 11/14/2020 15:43:10 -------------------------------------------------------------------------------- Lower Extremity Assessment Details Patient Name: Date of Service: [name], [name] 11/14/2020 2:45 PM Medical Record Number: [id number] Patient Account Number: [id number] Date of Birth/Sex: Treating RN: 1961/04/18 (60 y.o. Tonita Phoenix, Lauren Primary Care Hallis Meditz: Dustin Folks Other Clinician: Referring Orlanda Lemmerman: Treating Jevaeh Shams/Extender: Catalina Pizza in Treatment: 1 Edema Assessment Assessed: Shirlyn Goltz: No] Patrice Paradise: Yes] Edema: [Left: Ye] [Right: s] Calf Left: Right: Point of Measurement: 37 cm From Medial Instep 30.4 cm Ankle Left: Right: Point of Measurement: 7 cm From Medial Instep 21 cm Vascular Assessment Pulses: Dorsalis Pedis Palpable: [Right:Yes] Posterior Tibial Palpable: [Right:Yes] Electronic Signature(s) Signed: 11/25/2020 3:14:58 PM By: Rhae Hammock RN Entered By: Rhae Hammock on 11/14/2020 15:18:05 -------------------------------------------------------------------------------- Multi Wound Chart Details Patient Name: Date of Service: [name], [name] 11/14/2020 2:45 PM Medical Record Number: [id number] Patient Account Number: [id number] Date of Birth/Sex: Treating RN: 21-Jan-1961 (60 y.o. [name] Primary Care Artha Chiasson: Dustin Folks Other Clinician: Referring Monique Hefty: Treating Nekeya Briski/Extender: Catalina Pizza in Treatment: 1 Vital Signs Height(in): 71 Pulse(bpm):  84 Weight(lbs): 200 Blood Pressure(mmHg): 151/74 Body Mass Index(BMI): 28 Temperature(F): 97.7 Respiratory Rate(breaths/min): 17 Photos: [3:No Photos Medial Lower Leg] [N/A:N/A N/A] Wound Location: [3:Gradually Appeared] [N/A:N/A] Wounding Event: [3:Venous Leg Ulcer] [N/A:N/A] Primary Etiology: [3:Cataracts, Anemia, Sickle Cell] [N/A:N/A] Comorbid History: [3:Disease, Hepatitis C, Rheumatoid Arthritis 07/23/2020] [N/A:N/A] Date Acquired: [3:1] [N/A:N/A] Weeks of Treatment: [3:Open] [N/A:N/A] Wound Status: [3:2x1.7x0.2] [N/A:N/A] Measurements L x W x D (cm) [3:2.67] [N/A:N/A] A (cm) : rea [3:0.534] [N/A:N/A] Volume (cm) : [  3:24.40%] [N/A:N/A] % Reduction in Area: [3:24.50%] [N/A:N/A] % Reduction in Volume: [3:Full Thickness With Exposed Support] [N/A:N/A] Classification: [3:Structures Medium] [N/A:N/A] Exudate Amount: [3:Serosanguineous] [N/A:N/A] Exudate Type: [3:red, brown] [N/A:N/A] Exudate Color: [3:Distinct, outline attached] [N/A:N/A] Wound Margin: [3:Large (67-100%)] [N/A:N/A] Granulation Amount: [3:Red, Pink] [N/A:N/A] Granulation Quality: [3:Small (1-33%)] [N/A:N/A] Necrotic Amount: [3:Tendon: Yes] [N/A:N/A] Exposed Structures: [3:Muscle: Yes Fascia: No Fat Layer (Subcutaneous Tissue): No Joint: No Bone: No None] [N/A:N/A] Treatment Notes Wound #3 (Lower Leg) Wound Laterality: Medial Cleanser Soap and Water Discharge Instruction: May shower and wash wound with dial antibacterial soap and water prior to dressing change. Wound Cleanser Discharge Instruction: Cleanse the wound with wound cleanser prior to applying a clean dressing using gauze sponges, not tissue or cotton balls. Peri-Wound Care Triamcinolone 15 (g) Discharge Instruction: Use triamcinolone 15 (g)mixed with lotion to lower leg Sween Lotion (Moisturizing lotion) Discharge Instruction: Apply moisturizing lotion as directed Topical Primary Dressing Hydrofera Blue Classic Foam, 2x2 in Discharge  Instruction: Moisten with saline prior to applying to wound bed Secondary Dressing Woven Gauze Sponge, Non-Sterile 4x4 in Discharge Instruction: Apply over primary dressing as directed. Secured With Compression Wrap Kerlix Roll 4.5x3.1 (in/yd) Discharge Instruction: Apply Kerlix and Coban compression as directed. Coban Self-Adherent Wrap 4x5 (in/yd) Discharge Instruction: Apply over Kerlix as directed. Compression Stockings Add-Ons Electronic Signature(s) Signed: 11/14/2020 4:18:14 PM By: Kalman Shan DO Signed: 11/14/2020 4:57:45 PM By: Baruch Gouty RN, BSN Entered By: Kalman Shan on 11/14/2020 16:05:34 -------------------------------------------------------------------------------- Multi-Disciplinary Care Plan Details Patient Name: Date of Service: [name], [name] 11/14/2020 2:45 PM Medical Record Number: [id number] Patient Account Number: [id number] Date of Birth/Sex: Treating RN: 1960/09/16 (60 y.o. [name] Primary Care Gilverto Dileonardo: Dustin Folks Other Clinician: Referring Tanganika Barradas: Treating Gael Londo/Extender: Catalina Pizza in Treatment: 1 Active Inactive Wound/Skin Impairment Nursing Diagnoses: Impaired tissue integrity Knowledge deficit related to ulceration/compromised skin integrity Goals: Patient will have a decrease in wound volume by X% from date: (specify in notes) Date Initiated: 11/07/2020 Date Inactivated: 11/14/2020 Target Resolution Date: 11/23/2020 Goal Status: Met Patient/caregiver will verbalize understanding of skin care regimen Date Initiated: 11/07/2020 Target Resolution Date: 11/23/2020 Goal Status: Active Ulcer/skin breakdown will have a volume reduction of 30% by week 4 Date Initiated: 11/07/2020 Target Resolution Date: 11/23/2020 Goal Status: Active Interventions: Assess patient/caregiver ability to obtain necessary supplies Assess patient/caregiver ability to perform ulcer/skin care regimen upon admission and as  needed Assess ulceration(s) every visit Notes: Electronic Signature(s) Signed: 11/14/2020 4:57:45 PM By: Baruch Gouty RN, BSN Entered By: Baruch Gouty on 11/14/2020 15:28:07 -------------------------------------------------------------------------------- Pain Assessment Details Patient Name: Date of Service: [name], [name] 11/14/2020 2:45 PM Medical Record Number: [id number] Patient Account Number: [id number] Date of Birth/Sex: Treating RN: July 01, 1961 (60 y.o. Tonita Phoenix, Lauren Primary Care Asta Corbridge: Dustin Folks Other Clinician: Referring Ellenor Wisniewski: Treating Mozetta Murfin/Extender: Catalina Pizza in Treatment: 1 Active Problems Location of Pain Severity and Description of Pain Patient Has Paino No Site Locations Rate the pain. Current Pain Level: 0 Pain Management and Medication Current Pain Management: Electronic Signature(s) Signed: 11/25/2020 3:14:58 PM By: Rhae Hammock RN Entered By: Rhae Hammock on 11/14/2020 15:15:34 -------------------------------------------------------------------------------- Patient/Caregiver Education Details Patient Name: Date of Service: [name], [name] 4/25/2022andnbsp2:45 PM Medical Record Number: [id number] Patient Account Number: [id number] Date of Birth/Gender: Treating RN: August 31, 1960 (60 y.o. [name] Primary Care Physician: Dustin Folks Other Clinician: Referring Physician: Treating Physician/Extender: Catalina Pizza in Treatment: 1 Education Assessment Education Provided To: Patient Education Topics Provided Venous: Methods:  Explain/Verbal Responses: Reinforcements needed, State content correctly Wound/Skin Impairment: Methods: Explain/Verbal Responses: Reinforcements needed, State content correctly Electronic Signature(s) Signed: 11/14/2020 4:57:45 PM By: Baruch Gouty RN, BSN Entered By: Baruch Gouty on 11/14/2020  15:34:47 -------------------------------------------------------------------------------- Wound Assessment Details Patient Name: Date of Service: [name], [name] 11/14/2020 2:45 PM Medical Record Number: [id number] Patient Account Number: [id number] Date of Birth/Sex: Treating RN: [birth year] (60 y.o. Tonita Phoenix, Lauren Primary Care Dietrich Samuelson: Dustin Folks Other Clinician: Referring Maja Mccaffery: Treating Leodan Bolyard/Extender: Catalina Pizza in Treatment: 1 Wound Status Wound Number: 3 Primary Venous Leg Ulcer Etiology: Wound Location: Medial Lower Leg Wound Status: Open Wounding Event: Gradually Appeared Comorbid Cataracts, Anemia, Sickle Cell Disease, Hepatitis C, Date Acquired: 07/23/2020 History: Rheumatoid Arthritis Weeks Of Treatment: 1 Clustered Wound: No Photos Wound Measurements Length: (cm) 2 Width: (cm) 1.7 Depth: (cm) 0.2 Area: (cm) 2.67 Volume: (cm) 0.534 % Reduction in Area: 24.4% % Reduction in Volume: 24.5% Epithelialization: None Tunneling: No Undermining: No Wound Description Classification: Full Thickness With Exposed Support Structures Wound Margin: Distinct, outline attached Exudate Amount: Medium Exudate Type: Serosanguineous Exudate Color: red, brown Foul Odor After Cleansing: No Slough/Fibrino Yes Wound Bed Granulation Amount: Large (67-100%) Exposed Structure Granulation Quality: Red, Pink Fascia Exposed: No Necrotic Amount: Small (1-33%) Fat Layer (Subcutaneous Tissue) Exposed: No Necrotic Quality: Adherent Slough Tendon Exposed: Yes Muscle Exposed: Yes Necrosis of Muscle: No Joint Exposed: No Bone Exposed: No Electronic Signature(s) Signed: 11/15/2020 5:14:41 PM By: Sandre Kitty Signed: 11/25/2020 3:14:58 PM By: Rhae Hammock RN Entered By: Sandre Kitty on 11/15/2020 16:54:56 -------------------------------------------------------------------------------- Vitals Details Patient Name: Date of Service: [name],  [name] 11/14/2020 2:45 PM Medical Record Number: [id number] Patient Account Number: [id number] Date of Birth/Sex: Treating RN: 07/11/1961 (60 y.o. Tonita Phoenix, Lauren Primary Care Emmalea Treanor: Dustin Folks Other Clinician: Referring Elecia Serafin: Treating Juluis Fitzsimmons/Extender: Catalina Pizza in Treatment: 1 Vital Signs Time Taken: 15:15 Temperature (F): 97.7 Height (in): 71 Pulse (bpm): 84 Weight (lbs): 200 Respiratory Rate (breaths/min): 17 Body Mass Index (BMI): 27.9 Blood Pressure (mmHg): 151/74 Reference Range: 80 - 120 mg / dl Electronic Signature(s) Signed: 11/25/2020 3:14:58 PM By: Rhae Hammock RN Entered By: Rhae Hammock on 11/14/2020 15:15:24

## 2020-12-02 ENCOUNTER — Encounter (HOSPITAL_BASED_OUTPATIENT_CLINIC_OR_DEPARTMENT_OTHER): Payer: Medicare HMO | Admitting: Internal Medicine

## 2020-12-02 ENCOUNTER — Other Ambulatory Visit: Payer: Self-pay

## 2020-12-02 DIAGNOSIS — I872 Venous insufficiency (chronic) (peripheral): Secondary | ICD-10-CM | POA: Diagnosis not present
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
## 2020-12-05 NOTE — Progress Notes (Signed)
EBONY, YORIO ([id number]) Visit Report for 12/02/2020 Arrival Information Details Patient Name: Date of Service: Angela Burke EDA 12/02/2020 8:15 A M Medical Record Number: [id number] Patient Account Number: [id number] Date of Birth/Sex: Treating RN: Mar 15, 1961 (60 y.o. F) Primary Care Khara Renaud: Dustin Folks Other Clinician: Referring Rhonda Linan: Treating Julanne Schlueter/Extender: Rayna Sexton in Treatment: 3 Visit Information History Since Last Visit Added or deleted any medications: No Patient Arrived: Ambulatory Any new allergies or adverse reactions: No Arrival Time: 08:29 Had a fall or experienced change in No Accompanied By: daughter activities of daily living that may affect Transfer Assistance: None risk of falls: Patient Identification Verified: Yes Signs or symptoms of abuse/neglect since last visito No Secondary Verification Process Completed: Yes Hospitalized since last visit: No Patient Requires Transmission-Based Precautions: No Implantable device outside of the clinic excluding No Patient Has Alerts: No cellular tissue based products placed in the center since last visit: Has Dressing in Place as Prescribed: Yes Pain Present Now: No Electronic Signature(s) Signed: 12/02/2020 11:30:48 AM By: Sandre Kitty Entered By: Sandre Kitty on 12/02/2020 08:31:12 -------------------------------------------------------------------------------- Clinic Level of Care Assessment Details Patient Name: Date of Service: [name], [name] 12/02/2020 8:15 A M Medical Record Number: [id number] Patient Account Number: [id number] Date of Birth/Sex: Treating RN: May 13, 1961 (60 y.o. Sue Lush Primary Care Arkie Tagliaferro: Dustin Folks Other Clinician: Referring Rianna Lukes: Treating Bryson Palen/Extender: Rayna Sexton in Treatment: 3 Clinic Level of Care Assessment Items TOOL 4 Quantity Score X- 1 0 Use when only an EandM is performed on FOLLOW-UP  visit ASSESSMENTS - Nursing Assessment / Reassessment '[]'  - 0 Reassessment of Co-morbidities (includes updates in patient status) '[]'  - 0 Reassessment of Adherence to Treatment Plan ASSESSMENTS - Wound and Skin A ssessment / Reassessment X - Simple Wound Assessment / Reassessment - one wound 1 5 '[]'  - 0 Complex Wound Assessment / Reassessment - multiple wounds '[]'  - 0 Dermatologic / Skin Assessment (not related to wound area) ASSESSMENTS - Focused Assessment '[]'  - 0 Circumferential Edema Measurements - multi extremities '[]'  - 0 Nutritional Assessment / Counseling / Intervention '[]'  - 0 Lower Extremity Assessment (monofilament, tuning fork, pulses) '[]'  - 0 Peripheral Arterial Disease Assessment (using hand held doppler) ASSESSMENTS - Ostomy and/or Continence Assessment and Care '[]'  - 0 Incontinence Assessment and Management '[]'  - 0 Ostomy Care Assessment and Management (repouching, etc.) PROCESS - Coordination of Care '[]'  - 0 Simple Patient / Family Education for ongoing care X- 1 20 Complex (extensive) Patient / Family Education for ongoing care X- 1 10 Staff obtains Programmer, systems, Records, T Results / Process Orders est '[]'  - 0 Staff telephones HHA, Nursing Homes / Clarify orders / etc '[]'  - 0 Routine Transfer to another Facility (non-emergent condition) '[]'  - 0 Routine Hospital Admission (non-emergent condition) '[]'  - 0 New Admissions / Biomedical engineer / Ordering NPWT Apligraf, etc. , '[]'  - 0 Emergency Hospital Admission (emergent condition) X- 1 10 Simple Discharge Coordination '[]'  - 0 Complex (extensive) Discharge Coordination PROCESS - Special Needs '[]'  - 0 Pediatric / Minor Patient Management '[]'  - 0 Isolation Patient Management '[]'  - 0 Hearing / Language / Visual special needs '[]'  - 0 Assessment of Community assistance (transportation, D/C planning, etc.) '[]'  - 0 Additional assistance / Altered mentation '[]'  - 0 Support Surface(s) Assessment (bed, cushion, seat,  etc.) INTERVENTIONS - Wound Cleansing / Measurement X - Simple Wound Cleansing - one wound 1 5 '[]'  - 0 Complex Wound Cleansing - multiple wounds X- 1 5 Wound  Imaging (photographs - any number of wounds) '[]'  - 0 Wound Tracing (instead of photographs) X- 1 5 Simple Wound Measurement - one wound '[]'  - 0 Complex Wound Measurement - multiple wounds INTERVENTIONS - Wound Dressings '[]'  - 0 Small Wound Dressing one or multiple wounds X- 1 15 Medium Wound Dressing one or multiple wounds '[]'  - 0 Large Wound Dressing one or multiple wounds '[]'  - 0 Application of Medications - topical '[]'  - 0 Application of Medications - injection INTERVENTIONS - Miscellaneous '[]'  - 0 External ear exam '[]'  - 0 Specimen Collection (cultures, biopsies, blood, body fluids, etc.) '[]'  - 0 Specimen(s) / Culture(s) sent or taken to Lab for analysis '[]'  - 0 Patient Transfer (multiple staff / Civil Service fast streamer / Similar devices) '[]'  - 0 Simple Staple / Suture removal (25 or less) '[]'  - 0 Complex Staple / Suture removal (26 or more) '[]'  - 0 Hypo / Hyperglycemic Management (close monitor of Blood Glucose) '[]'  - 0 Ankle / Brachial Index (ABI) - do not check if billed separately X- 1 5 Vital Signs Has the patient been seen at the hospital within the last three years: Yes Total Score: 80 Level Of Care: New/Established - Level 3 Electronic Signature(s) Signed: 12/02/2020 5:32:23 PM By: Lorrin Jackson Entered By: Lorrin Jackson on 12/02/2020 09:13:57 -------------------------------------------------------------------------------- Encounter Discharge Information Details Patient Name: Date of Service: [name], [name] 12/02/2020 8:15 A M Medical Record Number: [id number] Patient Account Number: [id number] Date of Birth/Sex: Treating RN: August 31, 1960 (60 y.o. [name] Primary Care Dorian Renfro: Dustin Folks Other Clinician: Referring Timberlee Roblero: Treating Sally-Anne Wamble/Extender: Rayna Sexton in Treatment:  3 Encounter Discharge Information Items Discharge Condition: Stable Ambulatory Status: Ambulatory Discharge Destination: Home Transportation: Private Auto Accompanied By: self Schedule Follow-up Appointment: Yes Clinical Summary of Care: Patient Declined Electronic Signature(s) Signed: 12/05/2020 4:50:33 PM By: Baruch Gouty RN, BSN Entered By: Baruch Gouty on 12/02/2020 09:42:29 -------------------------------------------------------------------------------- Lower Extremity Assessment Details Patient Name: Date of Service: [name], [name] 12/02/2020 8:15 A M Medical Record Number: [id number] Patient Account Number: [id number] Date of Birth/Sex: Treating RN: [birth year] (60 y.o. [name] Primary Care Anaiza Behrens: Dustin Folks Other Clinician: Referring Julian Askin: Treating Seona Clemenson/Extender: Rayna Sexton in Treatment: 3 Edema Assessment Assessed: Shirlyn Goltz: No] [Right: No] Edema: [Left: N] [Right: o] Calf Left: Right: Point of Measurement: 37 cm From Medial Instep 30 cm Ankle Left: Right: Point of Measurement: 7 cm From Medial Instep 20.5 cm Vascular Assessment Pulses: Dorsalis Pedis Palpable: [Right:Yes] Electronic Signature(s) Signed: 12/05/2020 4:50:33 PM By: Baruch Gouty RN, BSN Entered By: Baruch Gouty on 12/02/2020 08:59:05 -------------------------------------------------------------------------------- Multi Wound Chart Details Patient Name: Date of Service: [name], [name] 12/02/2020 8:15 A M Medical Record Number: [id number] Patient Account Number: [id number] Date of Birth/Sex: Treating RN: 07/25/1960 (60 y.o. F) Primary Care Lin Hackmann: Dustin Folks Other Clinician: Referring Manly Nestle: Treating Dennison Mcdaid/Extender: Rayna Sexton in Treatment: 3 Vital Signs Height(in): 71 Pulse(bpm): 75 Weight(lbs): 200 Blood Pressure(mmHg): 175/85 Body Mass Index(BMI): 28 Temperature(F): 98.6 Respiratory Rate(breaths/min):  17 Photos: [3:No Photos Right, Medial Lower Leg] [N/A:N/A N/A] Wound Location: [3:Gradually Appeared] [N/A:N/A] Wounding Event: [3:Venous Leg Ulcer] [N/A:N/A] Primary Etiology: [3:Cataracts, Anemia, Sickle Cell] [N/A:N/A] Comorbid History: [3:Disease, Hepatitis C, Rheumatoid Arthritis 07/23/2020] [N/A:N/A] Date Acquired: [3:3] [N/A:N/A] Weeks of Treatment: [3:Open] [N/A:N/A] Wound Status: [3:1.3x0.7x0.1] [N/A:N/A] Measurements L x W x D (cm) [3:0.715] [N/A:N/A] A (cm) : rea [3:0.071] [N/A:N/A] Volume (cm) : [3:79.80%] [N/A:N/A] % Reduction in Area: [3:90.00%] [N/A:N/A] % Reduction in Volume: [  3:Full Thickness With Exposed Support] [N/A:N/A] Classification: [3:Structures Small] [N/A:N/A] Exudate Amount: [3:Serosanguineous] [N/A:N/A] Exudate Type: [3:red, brown] [N/A:N/A] Exudate Color: [3:Distinct, outline attached] [N/A:N/A] Wound Margin: [3:Medium (34-66%)] [N/A:N/A] Granulation Amount: [3:Pink] [N/A:N/A] Granulation Quality: [3:Medium (34-66%)] [N/A:N/A] Necrotic Amount: [3:Tendon: Yes] [N/A:N/A] Exposed Structures: [3:Muscle: Yes Fascia: No Fat Layer (Subcutaneous Tissue): No Joint: No Bone: No Small (1-33%)] [N/A:N/A] Treatment Notes Electronic Signature(s) Signed: 12/02/2020 5:00:10 PM By: Linton Ham MD Entered By: Linton Ham on 12/02/2020 09:14:28 -------------------------------------------------------------------------------- Multi-Disciplinary Care Plan Details Patient Name: Date of Service: [name], [name] 12/02/2020 8:15 A M Medical Record Number: [id number] Patient Account Number: [id number] Date of Birth/Sex: Treating RN: 09-07-1960 (60 y.o. Sue Lush Primary Care Shuan Statzer: Dustin Folks Other Clinician: Referring Reyaan Thoma: Treating Johnnell Liou/Extender: Rayna Sexton in Treatment: 3 Active Inactive Wound/Skin Impairment Nursing Diagnoses: Impaired tissue integrity Knowledge deficit related to ulceration/compromised skin  integrity Goals: Patient will have a decrease in wound volume by X% from date: (specify in notes) Date Initiated: 11/07/2020 Date Inactivated: 11/14/2020 Target Resolution Date: 11/23/2020 Goal Status: Met Patient/caregiver will verbalize understanding of skin care regimen Date Initiated: 11/07/2020 Date Inactivated: 11/25/2020 Target Resolution Date: 11/23/2020 Goal Status: Met Ulcer/skin breakdown will have a volume reduction of 30% by week 4 Date Initiated: 11/07/2020 Date Inactivated: 11/25/2020 Target Resolution Date: 11/23/2020 Goal Status: Met Ulcer/skin breakdown will have a volume reduction of 80% by week 12 Date Initiated: 11/25/2020 Target Resolution Date: 12/23/2020 Goal Status: Active Interventions: Assess patient/caregiver ability to obtain necessary supplies Assess patient/caregiver ability to perform ulcer/skin care regimen upon admission and as needed Assess ulceration(s) every visit Provide education on ulcer and skin care Notes: Electronic Signature(s) Signed: 12/02/2020 8:40:41 AM By: Lorrin Jackson Entered By: Lorrin Jackson on 12/02/2020 08:40:40 -------------------------------------------------------------------------------- Pain Assessment Details Patient Name: Date of Service: [name], [name] 12/02/2020 8:15 A M Medical Record Number: [id number] Patient Account Number: [id number] Date of Birth/Sex: Treating RN: [birth year] (60 y.o. F) Primary Care Marai Teehan: Dustin Folks Other Clinician: Referring Santi Troung: Treating Sargon Scouten/Extender: Rayna Sexton in Treatment: 3 Active Problems Location of Pain Severity and Description of Pain Patient Has Paino No Site Locations Pain Management and Medication Current Pain Management: Electronic Signature(s) Signed: 12/02/2020 11:30:48 AM By: Sandre Kitty Entered By: Sandre Kitty on 12/02/2020 08:32:44 -------------------------------------------------------------------------------- Patient/Caregiver  Education Details Patient Name: Date of Service: [name], [name] 5/13/2022andnbsp8:15 A M Medical Record Number: [id number] Patient Account Number: [id number] Date of Birth/Gender: Treating RN: [birth year] (60 y.o. Sue Lush Primary Care Physician: Dustin Folks Other Clinician: Referring Physician: Treating Physician/Extender: Rayna Sexton in Treatment: 3 Education Assessment Education Provided To: Patient Education Topics Provided Venous: Methods: Explain/Verbal, Printed Responses: State content correctly Wound/Skin Impairment: Methods: Explain/Verbal, Printed Responses: State content correctly Electronic Signature(s) Signed: 12/02/2020 5:32:23 PM By: Lorrin Jackson Entered By: Lorrin Jackson on 12/02/2020 08:41:06 -------------------------------------------------------------------------------- Wound Assessment Details Patient Name: Date of Service: [name], [name] 12/02/2020 8:15 A M Medical Record Number: [id number] Patient Account Number: [id number] Date of Birth/Sex: Treating RN: 03-08-1961 (60 y.o. F) Primary Care Jinnie Onley: Dustin Folks Other Clinician: Referring Ade Stmarie: Treating Rosibel Giacobbe/Extender: Rayna Sexton in Treatment: 3 Wound Status Wound Number: 3 Primary Venous Leg Ulcer Etiology: Wound Location: Right, Medial Lower Leg Wound Status: Open Wounding Event: Gradually Appeared Comorbid Cataracts, Anemia, Sickle Cell Disease, Hepatitis C, Date Acquired: 07/23/2020 History: Rheumatoid Arthritis Weeks Of Treatment: 3 Clustered Wound: No Photos Wound Measurements Length: (cm) 1.3 Width: (cm) 0.7 Depth: (cm) 0.1 Area: (cm)  0.715 Volume: (cm) 0.071 % Reduction in Area: 79.8% % Reduction in Volume: 90% Epithelialization: Small (1-33%) Tunneling: No Undermining: No Wound Description Classification: Full Thickness With Exposed Support Structures Wound Margin: Distinct, outline attached Exudate Amount:  Small Exudate Type: Serosanguineous Exudate Color: red, brown Foul Odor After Cleansing: No Slough/Fibrino Yes Wound Bed Granulation Amount: Medium (34-66%) Exposed Structure Granulation Quality: Pink Fascia Exposed: No Necrotic Amount: Medium (34-66%) Fat Layer (Subcutaneous Tissue) Exposed: No Necrotic Quality: Adherent Slough Tendon Exposed: Yes Muscle Exposed: Yes Necrosis of Muscle: No Joint Exposed: No Bone Exposed: No Treatment Notes Wound #3 (Lower Leg) Wound Laterality: Right, Medial Cleanser Wound Cleanser Discharge Instruction: Cleanse the wound with wound cleanser prior to applying a clean dressing using gauze sponges, not tissue or cotton balls. Soap and Water Discharge Instruction: May shower and wash wound with dial antibacterial soap and water prior to dressing change. Peri-Wound Care Triamcinolone 15 (g) Discharge Instruction: Use triamcinolone 15 (g)mixed with lotion to lower leg Sween Lotion (Moisturizing lotion) Discharge Instruction: Apply moisturizing lotion as directed Topical Primary Dressing Hydrofera Blue Classic Foam, 2x2 in Discharge Instruction: Moisten with saline prior to applying to wound bed Secondary Dressing Woven Gauze Sponge, Non-Sterile 4x4 in Discharge Instruction: Apply over primary dressing as directed. Secured With Compression Wrap Kerlix Roll 4.5x3.1 (in/yd) Discharge Instruction: Apply Kerlix and Coban compression as directed. Coban Self-Adherent Wrap 4x5 (in/yd) Discharge Instruction: Apply over Kerlix as directed. Compression Stockings Add-Ons Electronic Signature(s) Signed: 12/02/2020 4:52:32 PM By: Sandre Kitty Entered By: Sandre Kitty on 12/02/2020 16:30:11 -------------------------------------------------------------------------------- Vitals Details Patient Name: Date of Service: [name], [name] 12/02/2020 8:15 A M Medical Record Number: [id number] Patient Account Number: [id number] Date of  Birth/Sex: Treating RN: [birth year] (60 y.o. F) Primary Care Loistine Eberlin: Dustin Folks Other Clinician: Referring Maysin Carstens: Treating Larayne Baxley/Extender: Rayna Sexton in Treatment: 3 Vital Signs Time Taken: 08:32 Temperature (F): 98.6 Height (in): 71 Pulse (bpm): 75 Weight (lbs): 200 Respiratory Rate (breaths/min): 17 Body Mass Index (BMI): 27.9 Blood Pressure (mmHg): 175/85 Reference Range: 80 - 120 mg / dl Electronic Signature(s) Signed: 12/02/2020 11:30:48 AM By: Sandre Kitty Entered By: Sandre Kitty on 12/02/2020 08:32:33

## 2020-12-09 ENCOUNTER — Encounter (HOSPITAL_BASED_OUTPATIENT_CLINIC_OR_DEPARTMENT_OTHER): Payer: Medicare HMO | Admitting: Internal Medicine

## 2020-12-09 ENCOUNTER — Other Ambulatory Visit: Payer: Self-pay

## 2020-12-09 DIAGNOSIS — I872 Venous insufficiency (chronic) (peripheral): Secondary | ICD-10-CM | POA: Diagnosis not present
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
## 2020-12-09 NOTE — Progress Notes (Addendum)
MARGY, SUMLER ([id number]) Visit Report for 12/09/2020 Arrival Information Details Patient Name: Date of Service: Angela Burke EDA 12/09/2020 1:15 PM Medical Record Number: [id number] Patient Account Number: [id number] Date of Birth/Sex: Treating RN: 1961/06/20 (60 y.o. Martyn Malay, Linda Primary Care Tiaunna Buford: Dustin Folks Other Clinician: Referring Journee Kohen: Treating Lashica Hannay/Extender: Rayna Sexton in Treatment: 4 Visit Information History Since Last Visit Added or deleted any medications: No Patient Arrived: Ambulatory Any new allergies or adverse reactions: No Arrival Time: 13:18 Had a fall or experienced change in No Accompanied By: self activities of daily living that may affect Transfer Assistance: None risk of falls: Patient Identification Verified: Yes Signs or symptoms of abuse/neglect since last visito No Secondary Verification Process Completed: Yes Hospitalized since last visit: No Patient Requires Transmission-Based Precautions: No Implantable device outside of the clinic excluding No Patient Has Alerts: No cellular tissue based products placed in the center since last visit: Has Dressing in Place as Prescribed: Yes Has Compression in Place as Prescribed: Yes Pain Present Now: Yes Electronic Signature(s) Signed: 12/09/2020 5:00:37 PM By: Baruch Gouty RN, BSN Entered By: Baruch Gouty on 12/09/2020 13:19:44 -------------------------------------------------------------------------------- Encounter Discharge Information Details Patient Name: Date of Service: [name], [name] 12/09/2020 1:15 PM Medical Record Number: [id number] Patient Account Number: [id number] Date of Birth/Sex: Treating RN: 11-Mar-1961 (60 y.o. [name] Primary Care Zahmir Lalla: Dustin Folks Other Clinician: Referring Rotunda Worden: Treating Austyn Seier/Extender: Rayna Sexton in Treatment: 4 Encounter Discharge Information Items Post Procedure  Vitals Discharge Condition: Stable Temperature (F): 98.9 Ambulatory Status: Ambulatory Pulse (bpm): 73 Discharge Destination: Home Respiratory Rate (breaths/min): 18 Transportation: Private Auto Blood Pressure (mmHg): 129/66 Accompanied By: self Schedule Follow-up Appointment: Yes Clinical Summary of Care: Patient Declined Electronic Signature(s) Signed: 12/09/2020 5:00:37 PM By: Baruch Gouty RN, BSN Entered By: Baruch Gouty on 12/09/2020 14:15:03 -------------------------------------------------------------------------------- Lower Extremity Assessment Details Patient Name: Date of Service: [name], [name] 12/09/2020 1:15 PM Medical Record Number: [id number] Patient Account Number: [id number] Date of Birth/Sex: Treating RN: 05-19-1961 (60 y.o. [name] Primary Care Clytie Shetley: Dustin Folks Other Clinician: Referring Bobette Leyh: Treating Larsen Dungan/Extender: Rayna Sexton in Treatment: 4 Edema Assessment Assessed: Shirlyn Goltz: No] [Right: No] Edema: [Left: N] [Right: o] Calf Left: Right: Point of Measurement: 37 cm From Medial Instep 27 cm Ankle Left: Right: Point of Measurement: 7 cm From Medial Instep 19.5 cm Vascular Assessment Pulses: Dorsalis Pedis Palpable: [Right:Yes] Electronic Signature(s) Signed: 12/09/2020 5:00:37 PM By: Baruch Gouty RN, BSN Entered By: Baruch Gouty on 12/09/2020 13:30:57 -------------------------------------------------------------------------------- Multi Wound Chart Details Patient Name: Date of Service: [name], [name] 12/09/2020 1:15 PM Medical Record Number: [id number] Patient Account Number: [id number] Date of Birth/Sex: Treating RN: 10/27/1960 (60 y.o. Sue Lush Primary Care Natika Geyer: Dustin Folks Other Clinician: Referring Junior Huezo: Treating Tristyn Demarest/Extender: Rayna Sexton in Treatment: 4 Vital Signs Height(in): 19 Pulse(bpm): 53 Weight(lbs): 200 Blood Pressure(mmHg):  129/66 Body Mass Index(BMI): 28 Temperature(F): 98.9 Respiratory Rate(breaths/min): 18 Photos: [3:No Photos Right, Medial Lower Leg] [N/A:N/A N/A] Wound Location: [3:Gradually Appeared] [N/A:N/A] Wounding Event: [3:Venous Leg Ulcer] [N/A:N/A] Primary Etiology: [3:Cataracts, Anemia, Sickle Cell] [N/A:N/A] Comorbid History: [3:Disease, Hepatitis C, Rheumatoid Arthritis 07/23/2020] [N/A:N/A] Date Acquired: [3:4] [N/A:N/A] Weeks of Treatment: [3:Open] [N/A:N/A] Wound Status: [3:1.2x0.9x0.1] [N/A:N/A] Measurements L x W x D (cm) [3:0.848] [N/A:N/A] A (cm) : rea [3:0.085] [N/A:N/A] Volume (cm) : [3:76.00%] [N/A:N/A] % Reduction in Area: [3:88.00%] [N/A:N/A] % Reduction in Volume: [3:Full Thickness With Exposed Support N/A] Classification: [3:Structures Small] [N/A:N/A] Exudate  A mount: [3:Serosanguineous] [N/A:N/A] Exudate Type: [3:red, brown] [N/A:N/A] Exudate Color: [3:Distinct, outline attached] [N/A:N/A] Wound Margin: [3:Medium (34-66%)] [N/A:N/A] Granulation A mount: [3:Pink] [N/A:N/A] Granulation Quality: [3:Medium (34-66%)] [N/A:N/A] Necrotic A mount: [3:Fat Layer (Subcutaneous Tissue): Yes N/A] Exposed Structures: [3:Fascia: No Tendon: No Muscle: No Joint: No Bone: No Small (1-33%)] [N/A:N/A] Epithelialization: [3:Debridement - Excisional] [N/A:N/A] Debridement: Pre-procedure Verification/Time Out 13:55 [N/A:N/A] Taken: [3:Lidocaine 5% topical ointment] [N/A:N/A] Pain Control: [3:Subcutaneous, Slough] [N/A:N/A] Tissue Debrided: [3:Skin/Subcutaneous Tissue] [N/A:N/A] Level: [3:1.08] [N/A:N/A] Debridement A (sq cm): [3:rea Curette] [N/A:N/A] Instrument: [3:Minimum] [N/A:N/A] Bleeding: [3:Pressure] [N/A:N/A] Hemostasis A chieved: [3:4] [N/A:N/A] Procedural Pain: [3:3] [N/A:N/A] Post Procedural Pain: [3:Procedure was tolerated well] [N/A:N/A] Debridement Treatment Response: [3:1.2x0.9x0.1] [N/A:N/A] Post Debridement Measurements L x W x D (cm) [3:0.085] [N/A:N/A] Post  Debridement Volume: (cm) [3:Debridement] [N/A:N/A] Treatment Notes Electronic Signature(s) Signed: 12/09/2020 4:33:12 PM By: Linton Ham MD Signed: 12/09/2020 5:20:22 PM By: Lorrin Jackson Entered By: Linton Ham on 12/09/2020 14:04:06 -------------------------------------------------------------------------------- Multi-Disciplinary Care Plan Details Patient Name: Date of Service: [name], [name] 12/09/2020 1:15 PM Medical Record Number: [id number] Patient Account Number: [id number] Date of Birth/Sex: Treating RN: 11-16-1960 (60 y.o. [name] Primary Care Myha Arizpe: Dustin Folks Other Clinician: Referring Money Mckeithan: Treating Witt Plitt/Extender: Rayna Sexton in Treatment: 4 Active Inactive Wound/Skin Impairment Nursing Diagnoses: Impaired tissue integrity Knowledge deficit related to ulceration/compromised skin integrity Goals: Patient will have a decrease in wound volume by X% from date: (specify in notes) Date Initiated: 11/07/2020 Date Inactivated: 11/14/2020 Target Resolution Date: 11/23/2020 Goal Status: Met Patient/caregiver will verbalize understanding of skin care regimen Date Initiated: 11/07/2020 Date Inactivated: 11/25/2020 Target Resolution Date: 11/23/2020 Goal Status: Met Ulcer/skin breakdown will have a volume reduction of 30% by week 4 Date Initiated: 11/07/2020 Date Inactivated: 11/25/2020 Target Resolution Date: 11/23/2020 Goal Status: Met Ulcer/skin breakdown will have a volume reduction of 80% by week 12 Date Initiated: 11/25/2020 Target Resolution Date: 12/23/2020 Goal Status: Active Interventions: Assess patient/caregiver ability to obtain necessary supplies Assess patient/caregiver ability to perform ulcer/skin care regimen upon admission and as needed Assess ulceration(s) every visit Provide education on ulcer and skin care Notes: Electronic Signature(s) Signed: 12/09/2020 5:00:37 PM By: Baruch Gouty RN, BSN Entered By:  Baruch Gouty on 12/09/2020 13:57:06 -------------------------------------------------------------------------------- Pain Assessment Details Patient Name: Date of Service: [name], [name] 12/09/2020 1:15 PM Medical Record Number: [id number] Patient Account Number: [id number] Date of Birth/Sex: Treating RN: 05-28-1961 (60 y.o. [name] Primary Care Tayten Heber: Dustin Folks Other Clinician: Referring Leoma Folds: Treating Dickie Labarre/Extender: Rayna Sexton in Treatment: 4 Active Problems Location of Pain Severity and Description of Pain Patient Has Paino Yes Site Locations Pain Location: Pain in Ulcers With Dressing Change: Yes Duration of the Pain. Constant / Intermittento Intermittent Rate the pain. Current Pain Level: 3 Worst Pain Level: 3 Least Pain Level: 0 Character of Pain Describe the Pain: Burning Pain Management and Medication Current Pain Management: Other: tolerable Is the Current Pain Management Adequate: Adequate How does your wound impact your activities of daily livingo Sleep: No Bathing: No Appetite: No Relationship With Others: No Bladder Continence: No Emotions: No Bowel Continence: No Work: No Toileting: No Drive: No Dressing: No Hobbies: No Engineer, maintenance) Signed: 12/09/2020 5:00:37 PM By: Baruch Gouty RN, BSN Entered By: Baruch Gouty on 12/09/2020 13:20:20 -------------------------------------------------------------------------------- Patient/Caregiver Education Details Patient Name: Date of Service: [name], [name] 5/20/2022andnbsp1:15 PM Medical Record Number: [id number] Patient Account Number: [id number] Date of Birth/Gender: Treating RN: Jul 19, 1961 (60 y.o. Martyn Malay, [facility]  Physician: Dustin Folks Other Clinician: Referring Physician: Treating Physician/Extender: Rayna Sexton in Treatment: 4 Education Assessment Education Provided To: Patient Education Topics  Provided Venous: Methods: Explain/Verbal Responses: Reinforcements needed, State content correctly Wound/Skin Impairment: Methods: Explain/Verbal Responses: Reinforcements needed, State content correctly Electronic Signature(s) Signed: 12/09/2020 5:00:37 PM By: Baruch Gouty RN, BSN Entered By: Baruch Gouty on 12/09/2020 13:57:43 -------------------------------------------------------------------------------- Wound Assessment Details Patient Name: Date of Service: [name], [name] 12/09/2020 1:15 PM Medical Record Number: [id number] Patient Account Number: [id number] Date of Birth/Sex: Treating RN: 04/04/1961 (60 y.o. [name] Primary Care Zylan Almquist: Dustin Folks Other Clinician: Referring Cayde Held: Treating Brandon Scarbrough/Extender: Rayna Sexton in Treatment: 4 Wound Status Wound Number: 3 Primary Venous Leg Ulcer Etiology: Wound Location: Right, Medial Lower Leg Wound Status: Open Wounding Event: Gradually Appeared Comorbid Cataracts, Anemia, Sickle Cell Disease, Hepatitis C, Date Acquired: 07/23/2020 History: Rheumatoid Arthritis Weeks Of Treatment: 4 Clustered Wound: No Photos Wound Measurements Length: (cm) 1.2 Width: (cm) 0.9 Depth: (cm) 0.1 Area: (cm) 0.848 Volume: (cm) 0.085 % Reduction in Area: 76% % Reduction in Volume: 88% Epithelialization: Small (1-33%) Tunneling: No Undermining: No Wound Description Classification: Full Thickness With Exposed Support Structures Wound Margin: Distinct, outline attached Exudate Amount: Small Exudate Type: Serosanguineous Exudate Color: red, brown Foul Odor After Cleansing: No Slough/Fibrino Yes Wound Bed Granulation Amount: Medium (34-66%) Exposed Structure Granulation Quality: Pink Fascia Exposed: No Necrotic Amount: Medium (34-66%) Fat Layer (Subcutaneous Tissue) Exposed: Yes Tendon Exposed: No Muscle Exposed: No Joint Exposed: No Bone Exposed: No Treatment Notes Wound #3 (Lower  Leg) Wound Laterality: Right, Medial Cleanser Wound Cleanser Discharge Instruction: Cleanse the wound with wound cleanser prior to applying a clean dressing using gauze sponges, not tissue or cotton balls. Soap and Water Discharge Instruction: May shower and wash wound with dial antibacterial soap and water prior to dressing change. Peri-Wound Care Triamcinolone 15 (g) Discharge Instruction: Use triamcinolone 15 (g)mixed with lotion to lower leg Sween Lotion (Moisturizing lotion) Discharge Instruction: Apply moisturizing lotion as directed Topical Primary Dressing IODOFLEX 0.9% Cadexomer Iodine Pad 4x6 cm Discharge Instruction: Apply to wound bed as instructed Secondary Dressing Woven Gauze Sponge, Non-Sterile 4x4 in Discharge Instruction: Apply over primary dressing as directed. Secured With Compression Wrap Kerlix Roll 4.5x3.1 (in/yd) Discharge Instruction: Apply Kerlix and Coban compression as directed. Coban Self-Adherent Wrap 4x5 (in/yd) Discharge Instruction: Apply over Kerlix as directed. Compression Stockings Add-Ons Electronic Signature(s) Signed: 12/12/2020 3:41:13 PM By: Sandre Kitty Signed: 12/13/2020 5:08:08 PM By: Baruch Gouty RN, BSN Previous Signature: 12/09/2020 5:00:37 PM Version By: Baruch Gouty RN, BSN Entered By: Sandre Kitty on 12/12/2020 09:01:47 -------------------------------------------------------------------------------- Vitals Details Patient Name: Date of Service: [name], [name] 12/09/2020 1:15 PM Medical Record Number: [id number] Patient Account Number: [id number] Date of Birth/Sex: Treating RN: [birth year] (60 y.o. [name] Primary Care Honest Vanleer: Dustin Folks Other Clinician: Referring Analysse Quinonez: Treating Parys Elenbaas/Extender: Rayna Sexton in Treatment: 4 Vital Signs Time Taken: 13:20 Temperature (F): 98.9 Height (in): 71 Pulse (bpm): 73 Source: Stated Respiratory Rate (breaths/min): 18 Weight  (lbs): 200 Blood Pressure (mmHg): 129/66 Source: Stated Reference Range: 80 - 120 mg / dl Body Mass Index (BMI): 27.9 Electronic Signature(s) Signed: 12/09/2020 5:00:37 PM By: Baruch Gouty RN, BSN Entered By: Baruch Gouty on 12/09/2020 13:20:54

## 2020-12-15 ENCOUNTER — Encounter (HOSPITAL_BASED_OUTPATIENT_CLINIC_OR_DEPARTMENT_OTHER): Payer: Medicare HMO | Admitting: Internal Medicine

## 2020-12-22 ENCOUNTER — Encounter (HOSPITAL_BASED_OUTPATIENT_CLINIC_OR_DEPARTMENT_OTHER): Payer: Medicare HMO | Admitting: Internal Medicine

## 2020-12-23 ENCOUNTER — Other Ambulatory Visit: Payer: Self-pay

## 2020-12-23 ENCOUNTER — Encounter (HOSPITAL_BASED_OUTPATIENT_CLINIC_OR_DEPARTMENT_OTHER): Payer: Medicare HMO | Attending: Internal Medicine | Admitting: Internal Medicine

## 2020-12-23 DIAGNOSIS — L97819 Non-pressure chronic ulcer of other part of right lower leg with unspecified severity: Secondary | ICD-10-CM | POA: Insufficient documentation

## 2020-12-23 DIAGNOSIS — M069 Rheumatoid arthritis, unspecified: Secondary | ICD-10-CM | POA: Insufficient documentation

## 2020-12-23 DIAGNOSIS — D571 Sickle-cell disease without crisis: Secondary | ICD-10-CM | POA: Diagnosis not present

## 2020-12-23 DIAGNOSIS — I872 Venous insufficiency (chronic) (peripheral): Secondary | ICD-10-CM | POA: Insufficient documentation
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
## 2020-12-26 NOTE — Progress Notes (Signed)
TARIANA, MOLDOVAN ([id number]) Visit Report for 12/23/2020 Arrival Information Details Patient Name: Date of Service: [name], [name] 12/23/2020 1:00 PM Medical Record Number: [id number] Patient Account Number: [id number] Date of Birth/Sex: Treating RN: Mar 15, 1961 (60 y.o. Female) Rhae Hammock Primary Care Jiovany Scheffel: Dustin Folks Other Clinician: Referring Zareah Hunzeker: Treating Keneshia Tena/Extender: Rayna Sexton in Treatment: 6 Visit Information History Since Last Visit Added or deleted any medications: No Patient Arrived: Ambulatory Any new allergies or adverse reactions: No Arrival Time: 13:32 Had a fall or experienced change in No Accompanied By: self activities of daily living that may affect Transfer Assistance: None risk of falls: Patient Identification Verified: Yes Signs or symptoms of abuse/neglect since last visito No Secondary Verification Process Completed: Yes Hospitalized since last visit: No Patient Requires Transmission-Based Precautions: No Implantable device outside of the clinic excluding No Patient Has Alerts: No cellular tissue based products placed in the center since last visit: Has Dressing in Place as Prescribed: Yes Pain Present Now: No Electronic Signature(s) Signed: 12/26/2020 5:37:32 PM By: Rhae Hammock RN Entered By: Rhae Hammock on 12/23/2020 13:32:33 -------------------------------------------------------------------------------- Lower Extremity Assessment Details Patient Name: Date of Service: [name], [name] 12/23/2020 1:00 PM Medical Record Number: [id number] Patient Account Number: [id number] Date of Birth/Sex: Treating RN: 1961-01-29 (60 y.o. Female) Rhae Hammock Primary Care Nemesio Castrillon: Dustin Folks Other Clinician: Referring Keigen Caddell: Treating Alvin Diffee/Extender: Rayna Sexton in Treatment: 6 Edema Assessment Assessed: Shirlyn Goltz: No] [Right: Yes] Edema: [Left: Ye] [Right: s] Calf Left: Right: Point  of Measurement: 37 cm From Medial Instep 30 cm Ankle Left: Right: Point of Measurement: 7 cm From Medial Instep 21 cm Vascular Assessment Pulses: Dorsalis Pedis Palpable: [Right:Yes] Posterior Tibial Palpable: [Right:Yes] Electronic Signature(s) Signed: 12/26/2020 5:37:32 PM By: Rhae Hammock RN Entered By: Rhae Hammock on 12/23/2020 13:38:39 -------------------------------------------------------------------------------- Multi Wound Chart Details Patient Name: Date of Service: [name], [name] 12/23/2020 1:00 PM Medical Record Number: [id number] Patient Account Number: [id number] Date of Birth/Sex: Treating RN: 1961/03/15 (60 y.o. Female) Lorrin Jackson Primary Care Tamsyn Owusu: Dustin Folks Other Clinician: Referring Leone Mobley: Treating Odai Wimmer/Extender: Rayna Sexton in Treatment: 6 Vital Signs Height(in): 30 Pulse(bpm): 60 Weight(lbs): 200 Blood Pressure(mmHg): 126/62 Body Mass Index(BMI): 28 Temperature(F): 98.6 Respiratory Rate(breaths/min): 17 Photos: [3:No Photos Right, Medial Lower Leg] [N/A:N/A N/A] Wound Location: [3:Gradually Appeared] [N/A:N/A] Wounding Event: [3:Venous Leg Ulcer] [N/A:N/A] Primary Etiology: [3:Cataracts, Anemia, Sickle Cell] [N/A:N/A] Comorbid History: [3:Disease, Hepatitis C, Rheumatoid Arthritis 07/23/2020] [N/A:N/A] Date Acquired: [3:6] [N/A:N/A] Weeks of Treatment: [3:Open] [N/A:N/A] Wound Status: [3:0.5x0.6x0.1] [N/A:N/A] Measurements L x W x D (cm) [3:0.236] [N/A:N/A] A (cm) : rea [3:0.024] [N/A:N/A] Volume (cm) : [3:93.30%] [N/A:N/A] % Reduction in A [3:rea: 96.60%] [N/A:N/A] % Reduction in Volume: [3:Full Thickness With Exposed Support N/A] Classification: [3:Structures Small] [N/A:N/A] Exudate A mount: [3:Serosanguineous] [N/A:N/A] Exudate Type: [3:red, brown] [N/A:N/A] Exudate Color: [3:Distinct, outline attached] [N/A:N/A] Wound Margin: [3:None Present (0%)] [N/A:N/A] Granulation A mount: [3:Large  (67-100%)] [N/A:N/A] Necrotic A mount: [3:Eschar] [N/A:N/A] Necrotic Tissue: [3:Fat Layer (Subcutaneous Tissue): Yes N/A] Exposed Structures: [3:Fascia: No Tendon: No Muscle: No Joint: No Bone: No Small (1-33%)] [N/A:N/A] Epithelialization: [3:Debridement - Selective/Open Wound N/A] Debridement: Pre-procedure Verification/Time Out 14:07 [N/A:N/A] Taken: [3:Other] [N/A:N/A] Pain Control: [3:Necrotic/Eschar] [N/A:N/A] Tissue Debrided: [3:Non-Viable Tissue] [N/A:N/A] Level: [3:0.3] [N/A:N/A] Debridement A (sq cm): [3:rea Curette] [N/A:N/A] Instrument: [3:Minimum] [N/A:N/A] Bleeding: [3:Pressure] [N/A:N/A] Hemostasis A chieved: [3:Procedure was tolerated well] [N/A:N/A] Debridement Treatment Response: [3:0.5x0.6x0.1] [N/A:N/A] Post Debridement Measurements L x W x D (cm) [3:0.024] [N/A:N/A] Post Debridement Volume: (cm) [  3:Debridement] [N/A:N/A] Procedures Performed: Treatment Notes Electronic Signature(s) Signed: 12/23/2020 6:03:14 PM By: Lorrin Jackson Signed: 12/24/2020 7:13:41 AM By: Linton Ham MD Entered By: Linton Ham on 12/23/2020 14:14:49 -------------------------------------------------------------------------------- Multi-Disciplinary Care Plan Details Patient Name: Date of Service: [name], [name] 12/23/2020 1:00 PM Medical Record Number: [id number] Patient Account Number: [id number] Date of Birth/Sex: Treating RN: Apr 29, 1961 (60 y.o. Female) Lorrin Jackson Primary Care Eiza Canniff: Dustin Folks Other Clinician: Referring Mahkayla Preece: Treating Allysia Ingles/Extender: Rayna Sexton in Treatment: 6 Active Inactive Wound/Skin Impairment Nursing Diagnoses: Impaired tissue integrity Knowledge deficit related to ulceration/compromised skin integrity Goals: Patient will have a decrease in wound volume by X% from date: (specify in notes) Date Initiated: 11/07/2020 Date Inactivated: 11/14/2020 Target Resolution Date: 11/23/2020 Goal Status:  Met Patient/caregiver will verbalize understanding of skin care regimen Date Initiated: 11/07/2020 Date Inactivated: 11/25/2020 Target Resolution Date: 11/23/2020 Goal Status: Met Ulcer/skin breakdown will have a volume reduction of 30% by week 4 Date Initiated: 11/07/2020 Date Inactivated: 11/25/2020 Target Resolution Date: 11/23/2020 Goal Status: Met Ulcer/skin breakdown will have a volume reduction of 80% by week 12 Date Initiated: 11/25/2020 Date Inactivated: 12/23/2020 Target Resolution Date: 12/23/2020 Goal Status: Met Ulcer/skin breakdown will heal within 14 weeks Date Initiated: 12/23/2020 Target Resolution Date: 01/20/2021 Goal Status: Active Interventions: Assess patient/caregiver ability to obtain necessary supplies Assess patient/caregiver ability to perform ulcer/skin care regimen upon admission and as needed Assess ulceration(s) every visit Provide education on ulcer and skin care Notes: 12/23/20: wound greater than 30% volume reduction, new goal initiated. Electronic Signature(s) Signed: 12/23/2020 2:18:54 PM By: Lorrin Jackson Entered By: Lorrin Jackson on 12/23/2020 14:18:53 -------------------------------------------------------------------------------- Pain Assessment Details Patient Name: Date of Service: [name], [name] 12/23/2020 1:00 PM Medical Record Number: [id number] Patient Account Number: [id number] Date of Birth/Sex: Treating RN: 10/25/1960 (61 y.o. Female) Rhae Hammock Primary Care Charday Capetillo: Dustin Folks Other Clinician: Referring Yi Falletta: Treating Shavon Ashmore/Extender: Rayna Sexton in Treatment: 6 Active Problems Location of Pain Severity and Description of Pain Patient Has Paino No Site Locations Pain Management and Medication Current Pain Management: Electronic Signature(s) Signed: 12/26/2020 5:37:32 PM By: Rhae Hammock RN Entered By: Rhae Hammock on 12/23/2020  13:33:53 -------------------------------------------------------------------------------- Patient/Caregiver Education Details Patient Name: Date of Service: [name], [name] 6/3/2022andnbsp1:00 PM Medical Record Number: [id number] Patient Account Number: [id number] Date of Birth/Gender: Treating RN: August 31, 1960 (60 y.o. Female) Lorrin Jackson Primary Care Physician: Dustin Folks Other Clinician: Referring Physician: Treating Physician/Extender: Rayna Sexton in Treatment: 6 Education Assessment Education Provided To: Patient Education Topics Provided Venous: Methods: Explain/Verbal, Printed Responses: State content correctly Wound Debridement: Methods: Explain/Verbal Responses: State content correctly Wound/Skin Impairment: Methods: Explain/Verbal, Printed Responses: State content correctly Electronic Signature(s) Signed: 12/23/2020 6:03:14 PM By: Lorrin Jackson Entered By: Lorrin Jackson on 12/23/2020 14:19:12 -------------------------------------------------------------------------------- Wound Assessment Details Patient Name: Date of Service: [name], [name] 12/23/2020 1:00 PM Medical Record Number: [id number] Patient Account Number: [id number] Date of Birth/Sex: Treating RN: Dec 19, 1960 (61 y.o. Female) Lorrin Jackson Primary Care Tisha Cline: Dustin Folks Other Clinician: Referring Haillee Johann: Treating Juliona Vales/Extender: Rayna Sexton in Treatment: 6 Wound Status Wound Number: 3 Primary Venous Leg Ulcer Etiology: Wound Location: Right, Medial Lower Leg Wound Status: Open Wounding Event: Gradually Appeared Comorbid Cataracts, Anemia, Sickle Cell Disease, Hepatitis C, Date Acquired: 07/23/2020 History: Rheumatoid Arthritis Weeks Of Treatment: 6 Clustered Wound: No Photos Wound Measurements Length: (cm) 0.5 Width: (cm) 0.6 Depth: (cm) 0.1 Area: (cm) 0.236 Volume: (cm) 0.024 % Reduction in Area: 93.3% % Reduction in  Volume:  96.6% Epithelialization: Small (1-33%) Tunneling: No Undermining: No Wound Description Classification: Full Thickness With Exposed Support Structures Wound Margin: Distinct, outline attached Exudate Amount: Small Exudate Type: Serosanguineous Exudate Color: red, brown Foul Odor After Cleansing: No Slough/Fibrino Yes Wound Bed Granulation Amount: None Present (0%) Exposed Structure Necrotic Amount: Large (67-100%) Fascia Exposed: No Necrotic Quality: Eschar Fat Layer (Subcutaneous Tissue) Exposed: Yes Tendon Exposed: No Muscle Exposed: No Joint Exposed: No Bone Exposed: No Electronic Signature(s) Signed: 12/23/2020 5:48:41 PM By: Sandre Kitty Signed: 12/23/2020 6:03:14 PM By: Lorrin Jackson Entered By: Sandre Kitty on 12/23/2020 17:35:01 -------------------------------------------------------------------------------- Vitals Details Patient Name: Date of Service: [name], [name] 12/23/2020 1:00 PM Medical Record Number: [id number] Patient Account Number: [id number] Date of Birth/Sex: Treating RN: Nov 05, 1960 (60 y.o. Female) Rhae Hammock Primary Care Giulia Hickey: Dustin Folks Other Clinician: Referring Aryella Besecker: Treating Ellysa Parrack/Extender: Rayna Sexton in Treatment: 6 Vital Signs Time Taken: 13:33 Temperature (F): 98.6 Height (in): 71 Pulse (bpm): 60 Weight (lbs): 200 Respiratory Rate (breaths/min): 17 Body Mass Index (BMI): 27.9 Blood Pressure (mmHg): 126/62 Reference Range: 80 - 120 mg / dl Electronic Signature(s) Signed: 12/26/2020 5:37:32 PM By: Rhae Hammock RN Entered By: Rhae Hammock on 12/23/2020 13:33:04

## 2020-12-27 ENCOUNTER — Encounter (HOSPITAL_COMMUNITY): Payer: Self-pay | Admitting: *Deleted

## 2020-12-27 ENCOUNTER — Other Ambulatory Visit: Payer: Self-pay

## 2020-12-27 ENCOUNTER — Emergency Department (HOSPITAL_COMMUNITY)
Admission: EM | Admit: 2020-12-27 | Discharge: 2020-12-27 | Disposition: A | Discharge status: Home / Self Care | Payer: Medicare HMO | Attending: Emergency Medicine | Admitting: Emergency Medicine

## 2020-12-27 DIAGNOSIS — D649 Anemia, unspecified: Secondary | ICD-10-CM | POA: Insufficient documentation

## 2020-12-27 DIAGNOSIS — R42 Dizziness and giddiness: Secondary | ICD-10-CM | POA: Insufficient documentation

## 2020-12-27 DIAGNOSIS — E039 Hypothyroidism, unspecified: Secondary | ICD-10-CM | POA: Diagnosis not present

## 2020-12-27 DIAGNOSIS — R799 Abnormal finding of blood chemistry, unspecified: Secondary | ICD-10-CM | POA: Diagnosis present

## 2020-12-27 DIAGNOSIS — Z79899 Other long term (current) drug therapy: Secondary | ICD-10-CM | POA: Insufficient documentation

## 2020-12-27 LAB — RETICULOCYTES
Immature Retic Fract: 30.7 % — ABNORMAL HIGH (ref 2.3–15.9)
RBC.: 2.4 MIL/uL — ABNORMAL LOW (ref 3.87–5.11)
Retic Count, Absolute: 171 10*3/uL (ref 19.0–186.0)
Retic Ct Pct: 7.1 % — ABNORMAL HIGH (ref 0.4–3.1)

## 2020-12-27 LAB — COMPREHENSIVE METABOLIC PANEL
ALT: 10 U/L (ref 0–44)
AST: 37 U/L (ref 15–41)
Albumin: 3.6 g/dL (ref 3.5–5.0)
Alkaline Phosphatase: 66 U/L (ref 38–126)
Anion gap: 4 — ABNORMAL LOW (ref 5–15)
BUN: 7 mg/dL (ref 6–20)
CO2: 25 mmol/L (ref 22–32)
Calcium: 9 mg/dL (ref 8.9–10.3)
Chloride: 109 mmol/L (ref 98–111)
Creatinine, Ser: 0.8 mg/dL (ref 0.44–1.00)
GFR, Estimated: >60 mL/min (ref >60)
Glucose, Bld: 99 mg/dL (ref 70–99)
Potassium: 3.3 mmol/L — ABNORMAL LOW (ref 3.5–5.1)
Sodium: 138 mmol/L (ref 135–145)
Total Bilirubin: 4.1 mg/dL — ABNORMAL HIGH (ref 0.3–1.2)
Total Protein: 7.8 g/dL (ref 6.5–8.1)

## 2020-12-27 LAB — PREPARE RBC (CROSSMATCH)

## 2020-12-27 LAB — CBC WITH DIFFERENTIAL/PLATELET
Abs Immature Granulocytes: 0.03 10*3/uL (ref 0.00–0.07)
Basophils Absolute: 0.1 10*3/uL (ref 0.0–0.1)
Basophils Relative: 1 %
Eosinophils Absolute: 0.3 10*3/uL (ref 0.0–0.5)
Eosinophils Relative: 4 %
HCT: 14.9 % — ABNORMAL LOW (ref 36.0–46.0)
Hemoglobin: 5 g/dL — CL (ref 12.0–15.0)
Immature Granulocytes: 0 %
Lymphocytes Relative: 40 %
Lymphs Abs: 3 10*3/uL (ref 0.7–4.0)
MCH: 27 pg (ref 26.0–34.0)
MCHC: 33.6 g/dL (ref 30.0–36.0)
MCV: 80.5 fL (ref 80.0–100.0)
Monocytes Absolute: 1 10*3/uL (ref 0.1–1.0)
Monocytes Relative: 13 %
Neutro Abs: 3.1 10*3/uL (ref 1.7–7.7)
Neutrophils Relative %: 42 %
Platelets: 201 10*3/uL (ref 150–400)
RBC: 1.85 MIL/uL — ABNORMAL LOW (ref 3.87–5.11)
RDW: 25.4 % — ABNORMAL HIGH (ref 11.5–15.5)
WBC: 7.5 10*3/uL (ref 4.0–10.5)
nRBC: 7.6 % — ABNORMAL HIGH (ref 0.0–0.2)

## 2020-12-27 LAB — IRON AND TIBC
Iron: 69 ug/dL (ref 28–170)
Saturation Ratios: 16 % (ref 10.4–31.8)
TIBC: 432 ug/dL (ref 250–450)
UIBC: 363 ug/dL

## 2020-12-27 LAB — POC OCCULT BLOOD, ED: Fecal Occult Bld: NEGATIVE

## 2020-12-27 LAB — FOLATE: Folate: 15.1 ng/mL (ref >5.9)

## 2020-12-27 LAB — FERRITIN: Ferritin: 31 ng/mL (ref 11–307)

## 2020-12-27 LAB — VITAMIN B12: Vitamin B-12: 347 pg/mL (ref 180–914)

## 2020-12-27 MED ORDER — SODIUM CHLORIDE 0.9 % IV SOLN
10.0000 mL/h | Freq: Once | INTRAVENOUS | Status: AC
  Administered 2020-12-27: 10 mL/h via INTRAVENOUS

## 2020-12-27 MED ORDER — ACETAMINOPHEN 325 MG PO TABS
650.0000 mg | ORAL_TABLET | Freq: Once | ORAL | Status: AC
  Administered 2020-12-27: 650 mg via ORAL
  Filled 2020-12-27: qty 2

## 2020-12-27 NOTE — ED Provider Notes (Signed)
[facility]-EMERGENCY DEPT Provider Note   CSN: [id number] Arrival date & time: 12/27/20  1259     History Chief Complaint  Patient presents with  . Abnormal Lab    Low Hgb    [name] is a 60 y.o. female.  HPI She presents for evaluation of known low hemoglobin.  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  She saw her doctor yesterday, for evaluation of depressive symptoms.  Screening blood work indicates a low blood count so she was sent here for transfusion.  She reports some dizziness with ambulation.  She denies shortness of breath, chest pain, focal weakness or paresthesia.  There are no other known modifying factors.  Past Medical History:  Diagnosis Date  . Aneurysm (HCC)   . Anxiety   . Arthritis   . Colon ulcer   . Gall stones   . Hypokalemia   . Hypothyroidism   . Pneumonia   . Sickle cell anemia (HCC)   . Ulcers of both lower extremities [facility])     Patient Active Problem List   Diagnosis Date Noted  . Aneurysm (HCC)   . Acute on chronic anemia 11/10/2019  . Sickle cell anemia with crisis (HCC) 11/19/2017  . Symptomatic anemia 02/06/2017  . Leg wound, right 02/06/2017  . Sickle cell crisis (HCC) 10/24/2015  . Anemia of chronic disease   . Hepatitis C 05/11/2014  . 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 Thrombophlebitis leg superficial 03/04/2013  . Rheumatoid arthritis (HCC) 12/16/2012  . Hypothyroidism 11/27/2011    Past Surgical History:  Procedure Laterality Date  . ANKLE SURGERY  1989 and 1990  . CEREBRAL ANEURYSM REPAIR    . COLONOSCOPY  05/12/2012   Procedure: COLONOSCOPY;  Surgeon: Hart Carwin, MD;  Location: [facility] ENDOSCOPY;  Service: Endoscopy;  Laterality: N/A;  . ESOPHAGOGASTRODUODENOSCOPY  05/12/2012   Procedure: ESOPHAGOGASTRODUODENOSCOPY (EGD);  Surgeon: Hart Carwin, MD;  Location: [facility];  Service: Endoscopy;  Laterality: N/A;  . gall stone removal    . GIVENS CAPSULE STUDY   05/13/2012   Procedure: GIVENS CAPSULE STUDY;  Surgeon: Hart Carwin, MD;  Location: [facility] ENDOSCOPY;  Service: Endoscopy;  Laterality: N/A;     OB History   No obstetric history on file.     Family History  Problem Relation Age of Onset  . Cancer Mother 85       Esophageal  . Sickle cell trait Mother   . Sickle cell trait Father   . HIV/AIDS Brother   . Colon cancer Neg Hx     Social History   Tobacco Use  . Smoking status: Never Smoker  . Smokeless tobacco: Never Used  Vaping Use  . Vaping Use: Never used  Substance Use Topics  . Alcohol use: Not Currently    Comment: Occasional  . Drug use: No    Home Medications Prior to Admission medications   Medication Sig Start Date End Date Taking? Authorizing Provider  albuterol (VENTOLIN HFA) 108 (90 Base) MCG/ACT inhaler Inhale 1-2 puffs into the lungs every 6 (six) hours as needed for wheezing or shortness of breath. 02/29/20  Yes [provider]  ascorbic acid (VITAMIN C) 500 MG tablet Take 500 mg by mouth daily.   Yes [provider]  cyclobenzaprine (FLEXERIL) 10 MG tablet Take 10 mg by mouth 2 (two) times daily as needed for muscle spasms. 05/19/20  Yes [provider]  DULoxetine (CYMBALTA) 30 MG capsule Take 30  mg by mouth daily. 12/26/20  Yes [provider]  gabapentin (NEURONTIN) 300 MG capsule Take 300 mg by mouth daily as needed (neuropathy).   Yes [provider]  levothyroxine (SYNTHROID) 175 MCG tablet Take 175 mcg by mouth daily before breakfast.   Yes [provider]  montelukast (SINGULAIR) 10 MG tablet Take 10 mg by mouth at bedtime.   Yes [provider]  omega-3 acid ethyl esters (LOVAZA) 1 g capsule Take 1 g by mouth daily. 05/18/20  Yes [provider]  VITAMIN D PO Take 1 capsule by mouth daily.   Yes [provider]    Allergies    Ampicillin and Demerol [meperidine]  Review of Systems   Review of Systems  All other  systems reviewed and are negative.   Physical Exam Updated Vital Signs BP (!) 148/79   Pulse (!) 55   Temp 98 F (36.7 C) (Oral)   Resp 14   SpO2 94%   Physical Exam Vitals and nursing note reviewed.  Constitutional:      Appearance: She is well-developed.  HENT:     Head: Normocephalic and atraumatic.     Mouth/Throat:     Mouth: Mucous membranes are moist.     Pharynx: No posterior oropharyngeal erythema.  Eyes:     Conjunctiva/sclera: Conjunctivae normal.     Pupils: Pupils are equal, round, and reactive to light.  Neck:     Trachea: Phonation normal.  Cardiovascular:     Rate and Rhythm: Normal rate and regular rhythm.  Pulmonary:     Effort: Pulmonary effort is normal.     Breath sounds: Normal breath sounds.  Chest:     Chest wall: No tenderness.  Abdominal:     General: There is no distension.     Palpations: Abdomen is soft.     Tenderness: There is no abdominal tenderness. There is no guarding.  Genitourinary:    Comments: Normal anus.  No stool in rectal vault.  No palpable mass in rectal vault.  Mucus sent for Hemoccult testing. Musculoskeletal:        General: Normal range of motion.     Cervical back: Normal range of motion and neck supple.  Skin:    General: Skin is warm and dry.  Neurological:     Mental Status: She is alert and oriented to person, place, and time.     Motor: No abnormal muscle tone.  Psychiatric:        Mood and Affect: Mood normal.        Behavior: Behavior normal.        Thought Content: Thought content normal.        Judgment: Judgment normal.     ED Results / Procedures / Treatments   Labs (all labs ordered are listed, but only abnormal results are displayed) Labs Reviewed  COMPREHENSIVE METABOLIC PANEL - Abnormal; Notable for the following components:      Result Value   Potassium 3.3 (*)    Total Bilirubin 4.1 (*)    Anion gap 4 (*)    All other components within normal limits  CBC WITH DIFFERENTIAL/PLATELET -  Abnormal; Notable for the following components:   RBC 1.85 (*)    Hemoglobin 5.0 (*)    HCT 14.9 (*)    RDW 25.4 (*)    nRBC 7.6 (*)    All other components within normal limits  RETICULOCYTES - Abnormal; Notable for the following components:   Retic Ct  Pct 7.1 (*)    RBC. 2.40 (*)    Immature Retic Fract 30.7 (*)    All other components within normal limits  VITAMIN B12  FOLATE  IRON AND TIBC  FERRITIN  POC OCCULT BLOOD, ED  TYPE AND SCREEN  PREPARE RBC (CROSSMATCH)  PREPARE RBC (CROSSMATCH)    EKG None  Radiology No results found.  Procedures .Critical Care Performed by: Mancel Bale, MD Authorized by: Mancel Bale, MD   Critical care provider statement:    Critical care time (minutes):  35   Critical care start time:  12/27/2020 3:29 PM   Critical care end time:  12/27/2020 11:20 PM   Critical care time was exclusive of:  Separately billable procedures and treating other patients   Critical care was necessary to treat or prevent imminent or life-threatening deterioration of the following conditions:  Circulatory failure   Critical care was time spent personally by me on the following activities:  Blood draw for specimens, development of treatment plan with patient or surrogate, discussions with consultants, evaluation of patient's response to treatment, examination of patient, obtaining history from patient or surrogate, ordering and performing treatments and interventions, ordering and review of laboratory studies, pulse oximetry, re-evaluation of patient's condition, review of old charts and ordering and review of radiographic studies     Medications Ordered in ED Medications  0.9 %  sodium chloride infusion (0 mL/hr Intravenous Stopped 12/27/20 2257)  acetaminophen (TYLENOL) tablet 650 mg (650 mg Oral Given 12/27/20 2106)    ED Course  I have reviewed the triage vital signs and the nursing notes.  Pertinent labs & imaging results that were available during my  care of the patient were reviewed by me and considered in my medical decision making (see chart for details).  Clinical Course as of 12/27/20 2318  Tue Dec 27, 2020  2028 Patient complains of left wrist that she states is caused by arthritis.  Tylenol ordered. [EW]    Clinical Course User Index [EW] Mancel Bale, MD   MDM Rules/Calculators/A&P                           Patient Vitals for the past 24 hrs:  BP Temp Temp src Pulse Resp SpO2  12/27/20 2245 (!) 148/79 98 F (36.7 C) Oral (!) 55 14 94 %  12/27/20 2200 (!) 151/77 -- -- 61 16 96 %  12/27/20 2106 (!) 119/56 -- -- 69 16 98 %  12/27/20 2049 132/62 98.1 F (36.7 C) Oral 63 16 --  12/27/20 2005 132/62 98.6 F (37 C) Oral (!) 57 16 99 %  12/27/20 1900 130/64 -- -- 61 15 96 %  12/27/20 1845 -- -- -- 62 13 95 %  12/27/20 1830 -- -- -- (!) 57 13 95 %  12/27/20 1815 -- -- -- (!) 56 14 93 %  12/27/20 1800 129/67 -- -- (!) 56 13 95 %  12/27/20 1745 -- -- -- 64 16 97 %  12/27/20 1730 -- -- -- (!) 58 19 97 %  12/27/20 1716 132/71 -- -- (!) 55 14 100 %  12/27/20 1715 -- -- -- (!) 54 14 96 %  12/27/20 1702 (!) 142/67 98.4 F (36.9 C) Oral 62 14 --  12/27/20 1700 (!) 142/67 -- -- 66 20 100 %  12/27/20 1645 -- -- -- 62 10 96 %  12/27/20 1630 -- -- -- (!) 59 14 94 %  12/27/20 1615 -- -- -- Marland Kitchen  52 15 99 %  12/27/20 1611 137/74 -- -- (!) 58 12 99 %  12/27/20 1304 130/71 98.7 F (37.1 C) Oral 64 16 93 %    11:18 PM Reevaluation with update and discussion. After initial assessment and treatment, an updated evaluation reveals she is comfortable has no further complaints.  Findings discussed and questions answered. Mancel Bale   Medical Decision Making:  This patient is presenting for evaluation of anemia, which does require a range of treatment options, and is a complaint that involves a moderate risk of morbidity and mortality. The differential diagnoses include GI bleeding, hematemesis, iron deficiency. I decided to review old  records, and in summary elderly female presenting with anemia, no known source for bleeding.  I did not require additional historical information from anyone.  Clinical Laboratory Tests Ordered, included CBC, Metabolic panel and Anemia panel. Review indicates normal except Hemoglobin low, reticulocyte count high,.   Critical Interventions-clinical evaluation, laboratory testing, blood transfusion, 2 units, observation  After These Interventions, the Patient was reevaluated and was found stable for discharge.  Patient arrived with normal hemoglobin.  She has history of GI bleeding but did not follow-up for colonoscopy as scheduled in September 2021.  She has history of sickle cell disease, apparently assess, and has chronically low hemoglobin because of that.  Baseline hemoglobin appears to be around 7.  Patient's only symptom at this time is mild dizziness.  She does not appear to be in crisis.  She has pain in her left wrist where it which appears swollen is likely from arthritis.  No active GI bleeding, based on rectal exam today.  She has not seen any blood in her stool, recently.  CRITICAL CARE-yes Performed by: Mancel Bale  Nursing Notes Reviewed/ Care Coordinated Applicable Imaging Reviewed Interpretation of Laboratory Data incorporated into ED treatment   Plan finished 2 units of blood transfusion then discharged home.  Encouraged her to follow-up with GI as previously planned for colonoscopy in PCP for treatment of sickle cell disease.    Final Clinical Impression(s) / ED Diagnoses Final diagnoses:  Symptomatic anemia    Rx / DC Orders ED Discharge Orders    None       Mancel Bale, MD 12/27/20 2320

## 2020-12-27 NOTE — Discharge Instructions (Signed)
Your hemoglobin was low at 5 today.  We gave you 2 units of blood which should get you to your baseline around 7.  Follow-up with your PCP, for sickle cell anemia treatment.  Follow-up with a GI doctor, to schedule a colonoscopy.  You have had blood loss in the stool previously, but there was none in the stool today.  Make sure you are getting plenty of rest, drink a lot of fluids, and sit down if you feel dizzy.

## 2020-12-27 NOTE — ED Provider Notes (Signed)
Emergency Medicine Provider Triage Evaluation Note  [name] , a 60 y.o. female  was evaluated in triage.  Pt complains of low hemoglobin.  Patient reports that she saw her PCP yesterday in hopes of getting referred to a mental health counselor, while there they decided to collect a routine blood work.  Patient reports she was called today and told she had a hemoglobin of 5.  She does have a history of sickle cell, reports that her baseline hemoglobin is typically around 7.  No associated bleeding symptoms, no chest pain, shortness of breath, lightheadedness or syncope.  Review of Systems  Positive: none Negative: Chest pain, shortness of breath, palpitations, lightheadedness, syncope  Physical Exam  BP 130/71 (BP Location: Right Arm)   Pulse 64   Temp 98.7 F (37.1 C) (Oral)   Resp 16   SpO2 93%  Gen:   Awake, no distress  Resp:  Normal effort  MSK:   Moves extremities without difficulty  Other:    Medical Decision Making  Medically screening exam initiated at 1:48 PM.  Appropriate orders placed.  Ailyne Pawley was informed that the remainder of the evaluation will be completed by another provider, this initial triage assessment does not replace that evaluation, and the importance of remaining in the ED until their evaluation is complete.     Dartha Lodge, PA-C 12/27/20 1355    Derwood Kaplan, MD 12/27/20 [phone]

## 2020-12-27 NOTE — ED Triage Notes (Signed)
Pt w/ hx of sickle cell anemia sent by PCP for low Hgb, reports it was 5. No pain or shortness of breath.

## 2020-12-27 NOTE — ED Notes (Signed)
Pt provided turkey sandwich and water.

## 2020-12-27 NOTE — ED Notes (Signed)
Blood bank has 2 units of blood ready for this patient. Notified Cari,RN.

## 2020-12-28 LAB — TYPE AND SCREEN
ABO/RH(D): B POS
Antibody Screen: NEGATIVE
Unit division: 0
Unit division: 0

## 2020-12-28 LAB — BPAM RBC
Blood Product Expiration Date: 202206302359
Blood Product Expiration Date: 202207022359
ISSUE DATE / TIME: 202206071644
ISSUE DATE / TIME: 202206072026
Unit Type and Rh: 5100
Unit Type and Rh: 5100

## 2020-12-30 ENCOUNTER — Encounter (HOSPITAL_BASED_OUTPATIENT_CLINIC_OR_DEPARTMENT_OTHER): Payer: Medicare HMO | Admitting: Internal Medicine

## 2020-12-30 ENCOUNTER — Other Ambulatory Visit: Payer: Self-pay

## 2020-12-30 DIAGNOSIS — I872 Venous insufficiency (chronic) (peripheral): Secondary | ICD-10-CM | POA: Diagnosis not present
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
## 2020-12-30 NOTE — Progress Notes (Signed)
[name], [name] ([id number]) Visit Report for 12/30/2020 Arrival Information Details Patient Name: Date of Service: [name], DEV Hall 12/30/2020 2:30 PM Medical Record Number: [id number] Patient Account Number: [id number] Date of Birth/Sex: Treating RN: March 06, 1961 (60 y.o. Sue Lush Primary Care [name]: Dustin Folks Other Clinician: Referring Airiana Elman: Treating Daionna Crossland/Extender: Rayna Sexton in Treatment: 7 Visit Information History Since Last Visit Added or deleted any medications: No Patient Arrived: Ambulatory Any new allergies or adverse reactions: No Arrival Time: 15:04 Had a fall or experienced change in No Accompanied By: self activities of daily living that may affect Transfer Assistance: None risk of falls: Patient Identification Verified: Yes Signs or symptoms of abuse/neglect since last visito No Secondary Verification Process Completed: Yes Hospitalized since last visit: No Patient Requires Transmission-Based Precautions: No Implantable device outside of the clinic excluding No Patient Has Alerts: No cellular tissue based products placed in the center since last visit: Has Dressing in Place as Prescribed: Yes Pain Present Now: No Electronic Signature(s) Signed: 12/30/2020 3:50:24 PM By: Sandre Kitty Entered By: Sandre Kitty on 12/30/2020 15:05:24 -------------------------------------------------------------------------------- Clinic Level of Care Assessment Details Patient Name: Date of Service: [name] 12/30/2020 2:30 PM Medical Record Number: [id number] Patient Account Number: [id number] Date of Birth/Sex: Treating RN: 1961/06/24 (60 y.o. Sue Lush Primary Care [name]: Dustin Folks Other Clinician: Referring Kaine Mcquillen: Treating Devaeh Amadi/Extender: Rayna Sexton in Treatment: 7 Clinic Level of Care Assessment Items TOOL 4 Quantity Score X- 1 0 Use when only an EandM is performed on FOLLOW-UP  visit ASSESSMENTS - Nursing Assessment / Reassessment X- 1 10 Reassessment of Co-morbidities (includes updates in patient status) X- 1 5 Reassessment of Adherence to Treatment Plan ASSESSMENTS - Wound and Skin A ssessment / Reassessment X - Simple Wound Assessment / Reassessment - one wound 1 5 '[]'  - 0 Complex Wound Assessment / Reassessment - multiple wounds '[]'  - 0 Dermatologic / Skin Assessment (not related to wound area) ASSESSMENTS - Focused Assessment '[]'  - 0 Circumferential Edema Measurements - multi extremities '[]'  - 0 Nutritional Assessment / Counseling / Intervention '[]'  - 0 Lower Extremity Assessment (monofilament, tuning fork, pulses) '[]'  - 0 Peripheral Arterial Disease Assessment (using hand held doppler) ASSESSMENTS - Ostomy and/or Continence Assessment and Care '[]'  - 0 Incontinence Assessment and Management '[]'  - 0 Ostomy Care Assessment and Management (repouching, etc.) PROCESS - Coordination of Care '[]'  - 0 Simple Patient / Family Education for ongoing care X- 1 20 Complex (extensive) Patient / Family Education for ongoing care '[]'  - 0 Staff obtains Programmer, systems, Records, T Results / Process Orders est '[]'  - 0 Staff telephones HHA, Nursing Homes / Clarify orders / etc '[]'  - 0 Routine Transfer to another Facility (non-emergent condition) '[]'  - 0 Routine Hospital Admission (non-emergent condition) '[]'  - 0 New Admissions / Biomedical engineer / Ordering NPWT Apligraf, etc. , '[]'  - 0 Emergency Hospital Admission (emergent condition) '[]'  - 0 Simple Discharge Coordination '[]'  - 0 Complex (extensive) Discharge Coordination PROCESS - Special Needs '[]'  - 0 Pediatric / Minor Patient Management '[]'  - 0 Isolation Patient Management '[]'  - 0 Hearing / Language / Visual special needs '[]'  - 0 Assessment of Community assistance (transportation, D/C planning, etc.) '[]'  - 0 Additional assistance / Altered mentation '[]'  - 0 Support Surface(s) Assessment (bed, cushion, seat,  etc.) INTERVENTIONS - Wound Cleansing / Measurement X - Simple Wound Cleansing - one wound 1 5 '[]'  - 0 Complex Wound Cleansing - multiple wounds X- 1 5 Wound  Imaging (photographs - any number of wounds) '[]'  - 0 Wound Tracing (instead of photographs) X- 1 5 Simple Wound Measurement - one wound '[]'  - 0 Complex Wound Measurement - multiple wounds INTERVENTIONS - Wound Dressings '[]'  - 0 Small Wound Dressing one or multiple wounds '[]'  - 0 Medium Wound Dressing one or multiple wounds X- 1 20 Large Wound Dressing one or multiple wounds X- 1 5 Application of Medications - topical '[]'  - 0 Application of Medications - injection INTERVENTIONS - Miscellaneous '[]'  - 0 External ear exam '[]'  - 0 Specimen Collection (cultures, biopsies, blood, body fluids, etc.) '[]'  - 0 Specimen(s) / Culture(s) sent or taken to Lab for analysis '[]'  - 0 Patient Transfer (multiple staff / Civil Service fast streamer / Similar devices) '[]'  - 0 Simple Staple / Suture removal (25 or less) '[]'  - 0 Complex Staple / Suture removal (26 or more) '[]'  - 0 Hypo / Hyperglycemic Management (close monitor of Blood Glucose) '[]'  - 0 Ankle / Brachial Index (ABI) - do not check if billed separately X- 1 5 Vital Signs Has the patient been seen at the hospital within the last three years: Yes Total Score: 85 Level Of Care: New/Established - Level 3 Electronic Signature(s) Signed: 12/30/2020 5:10:19 PM By: Lorrin Jackson Entered By: Lorrin Jackson on 12/30/2020 16:12:24 -------------------------------------------------------------------------------- Encounter Discharge Information Details Patient Name: Date of Service: [name] 12/30/2020 2:30 PM Medical Record Number: [id number] Patient Account Number: [id number] Date of Birth/Sex: Treating RN: 1960-08-31 (60 y.o. [name] Primary Care [name]: Dustin Folks Other Clinician: Referring Ashley Montminy: Treating Gloriann Riede/Extender: Rayna Sexton in Treatment:  7 Encounter Discharge Information Items Discharge Condition: Stable Ambulatory Status: Ambulatory Discharge Destination: Home Transportation: Private Auto Accompanied By: self Schedule Follow-up Appointment: Yes Clinical Summary of Care: Patient Declined Electronic Signature(s) Signed: 12/30/2020 6:16:40 PM By: Baruch Gouty RN, BSN Entered By: Baruch Gouty on 12/30/2020 16:24:09 -------------------------------------------------------------------------------- Lower Extremity Assessment Details Patient Name: Date of Service: [name] 12/30/2020 2:30 PM Medical Record Number: [id number] Patient Account Number: [id number] Date of Birth/Sex: Treating RN: [birth year] (60 y.o. [name] Primary Care Annisha Baar: Dustin Folks Other Clinician: Referring Javonna Balli: Treating Aamya Orellana/Extender: Rayna Sexton in Treatment: 7 Edema Assessment Assessed: Shirlyn Goltz: No] [Right: No] Edema: [Left: Ye] [Right: s] Calf Left: Right: Point of Measurement: 37 cm From Medial Instep 30 cm Ankle Left: Right: Point of Measurement: 7 cm From Medial Instep 19.8 cm Vascular Assessment Pulses: Dorsalis Pedis Palpable: [Right:Yes] Electronic Signature(s) Signed: 12/30/2020 6:16:40 PM By: Baruch Gouty RN, BSN Entered By: Baruch Gouty on 12/30/2020 15:31:18 -------------------------------------------------------------------------------- Multi Wound Chart Details Patient Name: Date of Service: [name] 12/30/2020 2:30 PM Medical Record Number: [id number] Patient Account Number: [id number] Date of Birth/Sex: Treating RN: May 25, 1961 (60 y.o. Sue Lush Primary Care Marguita Venning: Dustin Folks Other Clinician: Referring Bert Givans: Treating Kaiyah Eber/Extender: Rayna Sexton in Treatment: 7 Vital Signs Height(in): 71 Pulse(bpm): 1 Weight(lbs): 200 Blood Pressure(mmHg): 138/67 Body Mass Index(BMI): 28 Temperature(F): 98.4 Respiratory  Rate(breaths/min): 17 Photos: [N/A:N/A] Right, Medial Lower Leg N/A N/A Wound Location: Gradually Appeared N/A N/A Wounding Event: Venous Leg Ulcer N/A N/A Primary Etiology: Cataracts, Anemia, Sickle Cell N/A N/A Comorbid History: Disease, Hepatitis C, Rheumatoid Arthritis 07/23/2020 N/A N/A Date Acquired: 7 N/A N/A Weeks of Treatment: Open N/A N/A Wound Status: 0.3x0.2x0.1 N/A N/A Measurements L x W x D (cm) 0.047 N/A N/A A (cm) : rea 0.005 N/A N/A Volume (cm) : 98.70% N/A N/A % Reduction  in Area: 99.30% N/A N/A % Reduction in Volume: Full Thickness With Exposed Support N/A N/A Classification: Structures Small N/A N/A Exudate Amount: Serous N/A N/A Exudate Type: amber N/A N/A Exudate Color: Distinct, outline attached N/A N/A Wound Margin: Large (67-100%) N/A N/A Granulation Amount: Pink N/A N/A Granulation Quality: None Present (0%) N/A N/A Necrotic Amount: Fat Layer (Subcutaneous Tissue): Yes N/A N/A Exposed Structures: Fascia: No Tendon: No Muscle: No Joint: No Bone: No Small (1-33%) N/A N/A Epithelialization: Treatment Notes Wound #3 (Lower Leg) Wound Laterality: Right, Medial Cleanser Wound Cleanser Discharge Instruction: Cleanse the wound with wound cleanser prior to applying a clean dressing using gauze sponges, not tissue or cotton balls. Soap and Water Discharge Instruction: May shower and wash wound with dial antibacterial soap and water prior to dressing change. Peri-Wound Care Triamcinolone 15 (g) Discharge Instruction: Use triamcinolone 15 (g)mixed with lotion to lower leg Sween Lotion (Moisturizing lotion) Discharge Instruction: Apply moisturizing lotion as directed Topical Primary Dressing KerraCel Ag Gelling Fiber Dressing, 2x2 in (silver alginate) Discharge Instruction: Apply silver alginate to wound bed as instructed Secondary Dressing Woven Gauze Sponge, Non-Sterile 4x4 in Discharge Instruction: Apply over primary dressing as  directed. Secured With Compression Wrap Kerlix Roll 4.5x3.1 (in/yd) Discharge Instruction: Apply Kerlix and Coban compression as directed. Coban Self-Adherent Wrap 4x5 (in/yd) Discharge Instruction: Apply over Kerlix as directed. Compression Stockings Add-Ons Electronic Signature(s) Signed: 12/30/2020 5:10:19 PM By: Lorrin Jackson Signed: 12/30/2020 5:16:47 PM By: Linton Ham MD Entered By: Linton Ham on 12/30/2020 17:02:34 -------------------------------------------------------------------------------- Multi-Disciplinary Care Plan Details Patient Name: Date of Service: [name] 12/30/2020 2:30 PM Medical Record Number: [id number] Patient Account Number: [id number] Date of Birth/Sex: Treating RN: [birth year] (60 y.o. Sue Lush Primary Care Jeny Nield: Dustin Folks Other Clinician: Referring Seith Aikey: Treating Gerry Blanchfield/Extender: Rayna Sexton in Treatment: 7 Active Inactive Wound/Skin Impairment Nursing Diagnoses: Impaired tissue integrity Knowledge deficit related to ulceration/compromised skin integrity Goals: Patient will have a decrease in wound volume by X% from date: (specify in notes) Date Initiated: 11/07/2020 Date Inactivated: 11/14/2020 Target Resolution Date: 11/23/2020 Goal Status: Met Patient/caregiver will verbalize understanding of skin care regimen Date Initiated: 11/07/2020 Date Inactivated: 11/25/2020 Target Resolution Date: 11/23/2020 Goal Status: Met Ulcer/skin breakdown will have a volume reduction of 30% by week 4 Date Initiated: 11/07/2020 Date Inactivated: 11/25/2020 Target Resolution Date: 11/23/2020 Goal Status: Met Ulcer/skin breakdown will have a volume reduction of 80% by week 12 Date Initiated: 11/25/2020 Date Inactivated: 12/23/2020 Target Resolution Date: 12/23/2020 Goal Status: Met Ulcer/skin breakdown will heal within 14 weeks Date Initiated: 12/23/2020 Target Resolution Date: 01/20/2021 Goal Status:  Active Interventions: Assess patient/caregiver ability to obtain necessary supplies Assess patient/caregiver ability to perform ulcer/skin care regimen upon admission and as needed Assess ulceration(s) every visit Provide education on ulcer and skin care Notes: 12/23/20: wound greater than 30% volume reduction, new goal initiated. Electronic Signature(s) Signed: 12/30/2020 3:57:11 PM By: Lorrin Jackson Entered By: Lorrin Jackson on 12/30/2020 15:57:10 -------------------------------------------------------------------------------- Pain Assessment Details Patient Name: Date of Service: [name] 12/30/2020 2:30 PM Medical Record Number: [id number] Patient Account Number: [id number] Date of Birth/Sex: Treating RN: Aug 29, 1960 (60 y.o. Sue Lush Primary Care Neddie Steedman: Dustin Folks Other Clinician: Referring Shuntel Fishburn: Treating Cledith Abdou/Extender: Rayna Sexton in Treatment: 7 Active Problems Location of Pain Severity and Description of Pain Patient Has Paino No Site Locations Rate the pain. Current Pain Level: 0 Pain Management and Medication Current Pain Management: Electronic Signature(s) Signed: 12/30/2020 5:10:19 PM By: Lorrin Jackson  Signed: 12/30/2020 6:16:40 PM By: Baruch Gouty RN, BSN Entered By: Baruch Gouty on 12/30/2020 15:30:30 -------------------------------------------------------------------------------- Patient/Caregiver Education Details Patient Name: Date of Service: Carol Edwyna Shell, DEV Hall 6/10/2022andnbsp2:30 PM Medical Record Number: [id number] Patient Account Number: [id number] Date of Birth/Gender: Treating RN: 1960/12/09 (60 y.o. Sue Lush Primary Care Physician: Dustin Folks Other Clinician: Referring Physician: Treating Physician/Extender: Rayna Sexton in Treatment: 7 Education Assessment Education Provided To: Patient Education Topics Provided Venous: Methods: Explain/Verbal,  Printed Responses: State content correctly Wound/Skin Impairment: Methods: Demonstration, Explain/Verbal, Printed Responses: State content correctly Electronic Signature(s) Signed: 12/30/2020 5:10:19 PM By: Lorrin Jackson Entered By: Lorrin Jackson on 12/30/2020 15:57:37 -------------------------------------------------------------------------------- Wound Assessment Details Patient Name: Date of Service: [name] 12/30/2020 2:30 PM Medical Record Number: [id number] Patient Account Number: [id number] Date of Birth/Sex: Treating RN: 05-15-1961 (60 y.o. Sue Lush Primary Care Hazleigh Mccleave: Dustin Folks Other Clinician: Referring Pratt Bress: Treating Amil Bouwman/Extender: Rayna Sexton in Treatment: 7 Wound Status Wound Number: 3 Primary Venous Leg Ulcer Etiology: Wound Location: Right, Medial Lower Leg Wound Status: Open Wounding Event: Gradually Appeared Comorbid Cataracts, Anemia, Sickle Cell Disease, Hepatitis C, Date Acquired: 07/23/2020 History: Rheumatoid Arthritis Weeks Of Treatment: 7 Clustered Wound: No Photos Wound Measurements Length: (cm) 0.3 Width: (cm) 0.2 Depth: (cm) 0.1 Area: (cm) 0.047 Volume: (cm) 0.005 % Reduction in Area: 98.7% % Reduction in Volume: 99.3% Epithelialization: Small (1-33%) Tunneling: No Undermining: No Wound Description Classification: Full Thickness With Exposed Support Structures Wound Margin: Distinct, outline attached Exudate Amount: Small Exudate Type: Serous Exudate Color: amber Foul Odor After Cleansing: No Slough/Fibrino Yes Wound Bed Granulation Amount: Large (67-100%) Exposed Structure Granulation Quality: Pink Fascia Exposed: No Necrotic Amount: None Present (0%) Fat Layer (Subcutaneous Tissue) Exposed: Yes Tendon Exposed: No Muscle Exposed: No Joint Exposed: No Bone Exposed: No Treatment Notes Wound #3 (Lower Leg) Wound Laterality: Right, Medial Cleanser Wound Cleanser Discharge  Instruction: Cleanse the wound with wound cleanser prior to applying a clean dressing using gauze sponges, not tissue or cotton balls. Soap and Water Discharge Instruction: May shower and wash wound with dial antibacterial soap and water prior to dressing change. Peri-Wound Care Triamcinolone 15 (g) Discharge Instruction: Use triamcinolone 15 (g)mixed with lotion to lower leg Sween Lotion (Moisturizing lotion) Discharge Instruction: Apply moisturizing lotion as directed Topical Primary Dressing KerraCel Ag Gelling Fiber Dressing, 2x2 in (silver alginate) Discharge Instruction: Apply silver alginate to wound bed as instructed Secondary Dressing Woven Gauze Sponge, Non-Sterile 4x4 in Discharge Instruction: Apply over primary dressing as directed. Secured With Compression Wrap Kerlix Roll 4.5x3.1 (in/yd) Discharge Instruction: Apply Kerlix and Coban compression as directed. Coban Self-Adherent Wrap 4x5 (in/yd) Discharge Instruction: Apply over Kerlix as directed. Compression Stockings Add-Ons Electronic Signature(s) Signed: 12/30/2020 4:58:44 PM By: Sandre Kitty Signed: 12/30/2020 5:10:19 PM By: Lorrin Jackson Entered By: Sandre Kitty on 12/30/2020 16:49:27 -------------------------------------------------------------------------------- Vitals Details Patient Name: Date of Service: [name] 12/30/2020 2:30 PM Medical Record Number: [id number] Patient Account Number: [id number] Date of Birth/Sex: Treating RN: Feb 16, 1961 (60 y.o. Sue Lush Primary Care Daisey Caloca: Dustin Folks Other Clinician: Referring Ron Beske: Treating Chrishelle Zito/Extender: Rayna Sexton in Treatment: 7 Vital Signs Time Taken: 15:05 Temperature (F): 98.4 Height (in): 71 Pulse (bpm): 65 Weight (lbs): 200 Respiratory Rate (breaths/min): 17 Body Mass Index (BMI): 27.9 Blood Pressure (mmHg): 138/67 Reference Range: 80 - 120 mg / dl Electronic Signature(s) Signed:  12/30/2020 3:50:24 PM By: Sandre Kitty Entered By: Sandre Kitty on 12/30/2020 15:05:40

## 2021-01-06 ENCOUNTER — Encounter (HOSPITAL_BASED_OUTPATIENT_CLINIC_OR_DEPARTMENT_OTHER): Payer: Medicare HMO | Admitting: Internal Medicine

## 2021-01-06 ENCOUNTER — Other Ambulatory Visit: Payer: Self-pay

## 2021-01-06 DIAGNOSIS — I872 Venous insufficiency (chronic) (peripheral): Secondary | ICD-10-CM | POA: Diagnosis not present
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
## 2021-01-06 NOTE — Progress Notes (Addendum)
REYES, FIFIELD ([id number]) Visit Report for 01/06/2021 Arrival Information Details Patient Name: Date of Service: [name], [name] 01/06/2021 2:00 PM Medical Record Number: [id number] Patient Account Number: [id number] Date of Birth/Sex: Treating RN: 05/23/1961 (60 y.o. [name], [name] Primary Care Jacarri Gesner: Dustin Folks Other Clinician: Referring Tallia Moehring: Treating Raevyn Sokol/Extender: Rayna Sexton in Treatment: 8 Visit Information History Since Last Visit Added or deleted any medications: No Patient Arrived: Ambulatory Any new allergies or adverse reactions: No Arrival Time: 14:35 Had a fall or experienced change in No Accompanied By: self activities of daily living that may affect Transfer Assistance: None risk of falls: Patient Identification Verified: Yes Signs or symptoms of abuse/neglect since last visito No Secondary Verification Process Completed: Yes Hospitalized since last visit: No Patient Requires Transmission-Based Precautions: No Implantable device outside of the clinic excluding No Patient Has Alerts: No cellular tissue based products placed in the center since last visit: Has Dressing in Place as Prescribed: Yes Has Compression in Place as Prescribed: Yes Pain Present Now: No Electronic Signature(s) Signed: 01/06/2021 4:58:53 PM By: Deon Pilling Entered By: Deon Pilling on 01/06/2021 14:41:43 -------------------------------------------------------------------------------- Encounter Discharge Information Details Patient Name: Date of Service: [name], [name] 01/06/2021 2:00 PM Medical Record Number: [id number] Patient Account Number: [id number] Date of Birth/Sex: Treating RN: [birth year] (60 y.o. [name] Primary Care Netha Dafoe: Dustin Folks Other Clinician: Referring Hula Tasso: Treating Ayianna Darnold/Extender: Rayna Sexton in Treatment: 8 Encounter Discharge Information Items Post Procedure Vitals Discharge Condition:  Stable Temperature (F): 99 Ambulatory Status: Ambulatory Pulse (bpm): 65 Discharge Destination: Home Respiratory Rate (breaths/min): 18 Transportation: Private Auto Blood Pressure (mmHg): 148/71 Accompanied By: self Schedule Follow-up Appointment: Yes Clinical Summary of Care: Electronic Signature(s) Signed: 01/06/2021 4:58:53 PM By: Deon Pilling Entered By: Deon Pilling on 01/06/2021 16:29:32 -------------------------------------------------------------------------------- Lower Extremity Assessment Details Patient Name: Date of Service: [name], [name] 01/06/2021 2:00 PM Medical Record Number: [id number] Patient Account Number: [id number] Date of Birth/Sex: Treating RN: August 14, 1960 (60 y.o. [name], Tammi Klippel Primary Care Aleicia Kenagy: Dustin Folks Other Clinician: Referring Emmie Frakes: Treating Mechele Kittleson/Extender: Rayna Sexton in Treatment: 8 Edema Assessment Assessed: [name]: No] [name]: Yes] Edema: [Left: N] [Right: o] Calf Left: Right: Point of Measurement: 37 cm From Medial Instep 30 cm Ankle Left: Right: Point of Measurement: 7 cm From Medial Instep 19.5 cm Vascular Assessment Pulses: Dorsalis Pedis Palpable: [Right:Yes] Electronic Signature(s) Signed: 01/06/2021 4:58:53 PM By: Deon Pilling Entered By: Deon Pilling on 01/06/2021 14:42:16 -------------------------------------------------------------------------------- Multi Wound Chart Details Patient Name: Date of Service: [name], [name] 01/06/2021 2:00 PM Medical Record Number: [id number] Patient Account Number: [id number] Date of Birth/Sex: Treating RN: 01-30-1961 (60 y.o. [name] Primary Care Emree Locicero: Dustin Folks Other Clinician: Referring Elandra Powell: Treating Gurshan Settlemire/Extender: Rayna Sexton in Treatment: 8 Vital Signs Height(in): 71 Pulse(bpm): 32 Weight(lbs): 200 Blood Pressure(mmHg): 148/71 Body Mass Index(BMI): 28 Temperature(F): 99 Respiratory  Rate(breaths/min): 16 Photos: [3:No Photos Right, Medial Lower Leg] [N/A:N/A N/A] Wound Location: [3:Gradually Appeared] [N/A:N/A] Wounding Event: [3:Venous Leg Ulcer] [N/A:N/A] Primary Etiology: [3:Cataracts, Anemia, Sickle Cell] [N/A:N/A] Comorbid History: [3:Disease, Hepatitis C, Rheumatoid Arthritis 07/23/2020] [N/A:N/A] Date Acquired: [3:8] [N/A:N/A] Weeks of Treatment: [3:Open] [N/A:N/A] Wound Status: [3:0.1x0.1x0.1] [N/A:N/A] Measurements L x W x D (cm) [3:0.008] [N/A:N/A] A (cm) : rea [3:0.001] [N/A:N/A] Volume (cm) : [3:99.80%] [N/A:N/A] % Reduction in Area: [3:99.90%] [N/A:N/A] % Reduction in Volume: [3:Full Thickness With Exposed Support N/A] Classification: [3:Structures None Present] [N/A:N/A] Exudate A mount: [3:Distinct, outline attached] [N/A:N/A] Wound  Margin: [3:None Present (0%)] [N/A:N/A] Granulation A mount: [3:None Present (0%)] [N/A:N/A] Necrotic A mount: [3:Fat Layer (Subcutaneous Tissue): Yes N/A] Exposed Structures: [3:Fascia: No Tendon: No Muscle: No Joint: No Bone: No Large (67-100%)] [N/A:N/A] Epithelialization: [3:Debridement - Selective/Open Wound N/A] Debridement: Pre-procedure Verification/Time Out 15:09 [N/A:N/A] Taken: [3:Skin/Dermis] [N/A:N/A] Level: [3:0.01] [N/A:N/A] Debridement A (sq cm): [3:rea Curette] [N/A:N/A] Instrument: [3:Minimum] [N/A:N/A] Bleeding: [3:Pressure] [N/A:N/A] Hemostasis Achieved: [3:Procedure was tolerated well] [N/A:N/A] Debridement Treatment Response: [3:0.1x0.1x0.1] [N/A:N/A] Post Debridement Measurements L x W x D (cm) [3:0.001] [N/A:N/A] Post Debridement Volume: (cm) [3:Debridement] [N/A:N/A] Treatment Notes Electronic Signature(s) Signed: 01/06/2021 5:27:23 PM By: Linton Ham MD Signed: 01/06/2021 5:38:00 PM By: Lorrin Jackson Entered By: Linton Ham on 01/06/2021 15:29:29 -------------------------------------------------------------------------------- Multi-Disciplinary Care Plan Details Patient  Name: Date of Service: [name], [name] 01/06/2021 2:00 PM Medical Record Number: [id number] Patient Account Number: [id number] Date of Birth/Sex: Treating RN: 10-Aug-1960 (60 y.o. [name] Primary Care Lyana Asbill: Dustin Folks Other Clinician: Referring Enez Monahan: Treating Emony Dormer/Extender: Rayna Sexton in Treatment: 8 Active Inactive Wound/Skin Impairment Nursing Diagnoses: Impaired tissue integrity Knowledge deficit related to ulceration/compromised skin integrity Goals: Patient will have a decrease in wound volume by X% from date: (specify in notes) Date Initiated: 11/07/2020 Date Inactivated: 11/14/2020 Target Resolution Date: 11/23/2020 Goal Status: Met Patient/caregiver will verbalize understanding of skin care regimen Date Initiated: 11/07/2020 Date Inactivated: 11/25/2020 Target Resolution Date: 11/23/2020 Goal Status: Met Ulcer/skin breakdown will have a volume reduction of 30% by week 4 Date Initiated: 11/07/2020 Date Inactivated: 11/25/2020 Target Resolution Date: 11/23/2020 Goal Status: Met Ulcer/skin breakdown will have a volume reduction of 80% by week 12 Date Initiated: 11/25/2020 Date Inactivated: 12/23/2020 Target Resolution Date: 12/23/2020 Goal Status: Met Ulcer/skin breakdown will heal within 14 weeks Date Initiated: 12/23/2020 Target Resolution Date: 01/20/2021 Goal Status: Active Interventions: Assess patient/caregiver ability to obtain necessary supplies Assess patient/caregiver ability to perform ulcer/skin care regimen upon admission and as needed Assess ulceration(s) every visit Provide education on ulcer and skin care Notes: 12/23/20: wound greater than 30% volume reduction, new goal initiated. Electronic Signature(s) Signed: 01/06/2021 5:38:00 PM By: Lorrin Jackson Entered By: Lorrin Jackson on 01/06/2021 15:10:01 -------------------------------------------------------------------------------- Pain Assessment Details Patient Name: Date of  Service: [name], [name] 01/06/2021 2:00 PM Medical Record Number: [id number] Patient Account Number: [id number] Date of Birth/Sex: Treating RN: Mar 07, 1961 (60 y.o. [name] Primary Care Malone Vanblarcom: Dustin Folks Other Clinician: Referring Zoejane Gaulin: Treating Oneda Duffett/Extender: Rayna Sexton in Treatment: 8 Active Problems Location of Pain Severity and Description of Pain Patient Has Paino No Site Locations Rate the pain. Current Pain Level: 0 Pain Management and Medication Current Pain Management: Medication: No Cold Application: No Rest: No Massage: No Activity: No T.E.N.S.: No Heat Application: No Leg drop or elevation: No Is the Current Pain Management Adequate: Adequate How does your wound impact your activities of daily livingo Sleep: No Bathing: No Appetite: No Relationship With Others: No Bladder Continence: No Emotions: No Bowel Continence: No Work: No Toileting: No Drive: No Dressing: No Hobbies: No Electronic Signature(s) Signed: 01/06/2021 4:58:53 PM By: Deon Pilling Signed: 01/06/2021 4:58:53 PM By: Deon Pilling Entered By: Deon Pilling on 01/06/2021 14:42:07 -------------------------------------------------------------------------------- Patient/Caregiver Education Details Patient Name: Date of Service: [name], [name] 6/17/2022andnbsp2:00 PM Medical Record Number: [id number] Patient Account Number: [id number] Date of Birth/Gender: Treating RN: Apr 20, 1961 (60 y.o. [name] Primary Care Physician: Dustin Folks Other Clinician: Referring Physician: Treating Physician/Extender: Rayna Sexton in Treatment: 8 Education Assessment Education  Provided To: Patient Education Topics Provided Venous: Methods: Explain/Verbal, Printed Responses: State content correctly Wound/Skin Impairment: Methods: Explain/Verbal, Printed Responses: State content correctly Electronic Signature(s) Signed: 01/06/2021  5:38:00 PM By: Lorrin Jackson Entered By: Lorrin Jackson on 01/06/2021 15:11:08 -------------------------------------------------------------------------------- Wound Assessment Details Patient Name: Date of Service: [name], [name] 01/06/2021 2:00 PM Medical Record Number: [id number] Patient Account Number: [id number] Date of Birth/Sex: Treating RN: 1961/06/02 (60 y.o. [name], [name] Primary Care Chelse Matas: Dustin Folks Other Clinician: Referring Tou Hayner: Treating Ariam Mol/Extender: Rayna Sexton in Treatment: 8 Wound Status Wound Number: 3 Primary Venous Leg Ulcer Etiology: Wound Location: Right, Medial Lower Leg Wound Status: Open Wounding Event: Gradually Appeared Comorbid Cataracts, Anemia, Sickle Cell Disease, Hepatitis C, Date Acquired: 07/23/2020 History: Rheumatoid Arthritis Weeks Of Treatment: 8 Clustered Wound: No Photos Wound Measurements Length: (cm) 0.1 Width: (cm) 0.1 Depth: (cm) 0.1 Area: (cm) 0.008 Volume: (cm) 0.001 % Reduction in Area: 99.8% % Reduction in Volume: 99.9% Epithelialization: Large (67-100%) Tunneling: No Undermining: No Wound Description Classification: Full Thickness With Exposed Support Structures Wound Margin: Distinct, outline attached Exudate Amount: None Present Foul Odor After Cleansing: No Slough/Fibrino Yes Wound Bed Granulation Amount: None Present (0%) Exposed Structure Necrotic Amount: None Present (0%) Fascia Exposed: No Fat Layer (Subcutaneous Tissue) Exposed: Yes Tendon Exposed: No Muscle Exposed: No Joint Exposed: No Bone Exposed: No Treatment Notes Wound #3 (Lower Leg) Wound Laterality: Right, Medial Cleanser Soap and Water Discharge Instruction: May shower and wash wound with dial antibacterial soap and water prior to dressing change. Wound Cleanser Discharge Instruction: Cleanse the wound with wound cleanser prior to applying a clean dressing using gauze sponges, not tissue or cotton  balls. Peri-Wound Care Triamcinolone 15 (g) Discharge Instruction: Use triamcinolone 15 (g)mixed with lotion to lower leg Sween Lotion (Moisturizing lotion) Discharge Instruction: Apply moisturizing lotion as directed Topical Primary Dressing KerraCel Ag Gelling Fiber Dressing, 2x2 in (silver alginate) Discharge Instruction: Apply silver alginate to wound bed as instructed Secondary Dressing Woven Gauze Sponge, Non-Sterile 4x4 in Discharge Instruction: Apply over primary dressing as directed. Secured With Compression Wrap Kerlix Roll 4.5x3.1 (in/yd) Discharge Instruction: Apply Kerlix and Coban compression as directed. Coban Self-Adherent Wrap 4x5 (in/yd) Discharge Instruction: Apply over Kerlix as directed. Compression Stockings Add-Ons Electronic Signature(s) Signed: 01/09/2021 6:33:53 PM By: Deon Pilling Signed: 01/12/2021 8:38:46 AM By: Leane Call Previous Signature: 01/06/2021 4:58:53 PM Version By: Deon Pilling Entered By: Leane Call on 01/09/2021 14:27:37 -------------------------------------------------------------------------------- Vitals Details Patient Name: Date of Service: [name], [name] 01/06/2021 2:00 PM Medical Record Number: [id number] Patient Account Number: [id number] Date of Birth/Sex: Treating RN: [birth year] (60 y.o. [name], [name] Primary Care Sritha Chauncey: Dustin Folks Other Clinician: Referring Camden Mazzaferro: Treating Bernadetta Roell/Extender: Rayna Sexton in Treatment: 8 Vital Signs Time Taken: 14:35 Temperature (F): 99 Height (in): 71 Pulse (bpm): 65 Weight (lbs): 200 Respiratory Rate (breaths/min): 16 Body Mass Index (BMI): 27.9 Blood Pressure (mmHg): 148/71 Reference Range: 80 - 120 mg / dl Electronic Signature(s) Signed: 01/06/2021 4:58:53 PM By: Deon Pilling Entered By: Deon Pilling on 01/06/2021 14:41:58

## 2021-01-13 ENCOUNTER — Encounter (HOSPITAL_BASED_OUTPATIENT_CLINIC_OR_DEPARTMENT_OTHER): Payer: Medicare HMO | Admitting: Internal Medicine

## 2021-01-25 ENCOUNTER — Other Ambulatory Visit: Payer: Self-pay

## 2021-01-25 ENCOUNTER — Encounter (HOSPITAL_BASED_OUTPATIENT_CLINIC_OR_DEPARTMENT_OTHER): Payer: Medicare HMO | Attending: Internal Medicine | Admitting: Internal Medicine

## 2021-01-25 DIAGNOSIS — I872 Venous insufficiency (chronic) (peripheral): Secondary | ICD-10-CM | POA: Diagnosis not present

## 2021-01-25 DIAGNOSIS — M069 Rheumatoid arthritis, unspecified: Secondary | ICD-10-CM | POA: Insufficient documentation

## 2021-01-25 DIAGNOSIS — I890 Lymphedema, not elsewhere classified: Secondary | ICD-10-CM | POA: Diagnosis not present

## 2021-01-25 DIAGNOSIS — D571 Sickle-cell disease without crisis: Secondary | ICD-10-CM | POA: Insufficient documentation

## 2021-01-25 DIAGNOSIS — L97819 Non-pressure chronic ulcer of other part of right lower leg with unspecified severity: Secondary | ICD-10-CM | POA: Diagnosis not present

## 2021-01-25 DIAGNOSIS — B182 Chronic viral hepatitis C: Secondary | ICD-10-CM | POA: Diagnosis not present

## 2021-01-25 NOTE — Progress Notes (Signed)
[name], [name] ([id number]) Visit Report for 01/25/2021 Arrival Information Details Patient Name: Date of Service: [name], [name] 01/25/2021 10:00 A M Medical Record Number: [id number] Patient Account Number: [id number] Date of Birth/Sex: Treating RN: [birth year] (60 y.o. [name], [name] Primary Care [name]: [name] Other Clinician: Referring [name]: Treating Marcelia Petersen/Extender: [name] in Treatment: 11 Visit Information History Since Last Visit Added or deleted any medications: No Patient Arrived: Ambulatory Any new allergies or adverse reactions: No Arrival Time: 10:37 Had a fall or experienced change in No Accompanied By: self activities of daily living that may affect Transfer Assistance: None risk of falls: Patient Identification Verified: Yes Signs or symptoms of abuse/neglect since last visito No Secondary Verification Process Completed: Yes Hospitalized since last visit: No Patient Requires Transmission-Based Precautions: No Implantable device outside of the clinic excluding No Patient Has Alerts: No cellular tissue based products placed in the center since last visit: Has Dressing in Place as Prescribed: Yes Has Compression in Place as Prescribed: Yes Pain Present Now: No Notes Per patient follows up with PCP concerning elevated T3 T4 levels. Electronic Signature(s) Signed: 01/25/2021 6:50:03 PM By: Shawn Stall Entered By: Shawn Stall on 01/25/2021 10:38:19 -------------------------------------------------------------------------------- Clinic Level of Care Assessment Details Patient Name: Date of Service: [name], [name] 01/25/2021 10:00 A M Medical Record Number: [id number] Patient Account Number: [id number] Date of Birth/Sex: Treating RN: 09/23/1960 (60 y.o. [name] Primary Care Annastasia Haskins: [name] Other Clinician: Referring Danuel Felicetti: Treating Waunetta Riggle/Extender: [name] in Treatment: 11 Clinic  Level of Care Assessment Items TOOL 4 Quantity Score X- 1 0 Use when only an EandM is performed on FOLLOW-UP visit ASSESSMENTS - Nursing Assessment / Reassessment X- 1 10 Reassessment of Co-morbidities (includes updates in patient status) X- 1 5 Reassessment of Adherence to Treatment Plan ASSESSMENTS - Wound and Skin A ssessment / Reassessment X - Simple Wound Assessment / Reassessment - one wound 1 5 []  - 0 Complex Wound Assessment / Reassessment - multiple wounds []  - 0 Dermatologic / Skin Assessment (not related to wound area) ASSESSMENTS - Focused Assessment []  - 0 Circumferential Edema Measurements - multi extremities []  - 0 Nutritional Assessment / Counseling / Intervention X- 1 5 Lower Extremity Assessment (monofilament, tuning fork, pulses) []  - 0 Peripheral Arterial Disease Assessment (using hand held doppler) ASSESSMENTS - Ostomy and/or Continence Assessment and Care []  - 0 Incontinence Assessment and Management []  - 0 Ostomy Care Assessment and Management (repouching, etc.) PROCESS - Coordination of Care X - Simple Patient / Family Education for ongoing care 1 15 []  - 0 Complex (extensive) Patient / Family Education for ongoing care X- 1 10 Staff obtains Chiropractor, Records, T Results / Process Orders est []  - 0 Staff telephones HHA, Nursing Homes / Clarify orders / etc []  - 0 Routine Transfer to another Facility (non-emergent condition) []  - 0 Routine Hospital Admission (non-emergent condition) []  - 0 New Admissions / Manufacturing engineer / Ordering NPWT Apligraf, etc. , []  - 0 Emergency Hospital Admission (emergent condition) X- 1 10 Simple Discharge Coordination []  - 0 Complex (extensive) Discharge Coordination PROCESS - Special Needs []  - 0 Pediatric / Minor Patient Management []  - 0 Isolation Patient Management []  - 0 Hearing / Language / Visual special needs []  - 0 Assessment of Community assistance (transportation, D/C planning, etc.) []   - 0 Additional assistance / Altered mentation []  - 0 Support Surface(s) Assessment (bed, cushion, seat, etc.) INTERVENTIONS - Wound Cleansing / Measurement X -  Simple Wound Cleansing - one wound 1 5  - 0 Complex Wound Cleansing - multiple wounds X- 1 5 Wound Imaging (photographs - any number of wounds)  - 0 Wound Tracing (instead of photographs) X- 1 5 Simple Wound Measurement - one wound  - 0 Complex Wound Measurement - multiple wounds INTERVENTIONS - Wound Dressings  - 0 Small Wound Dressing one or multiple wounds  - 0 Medium Wound Dressing one or multiple wounds  - 0 Large Wound Dressing one or multiple wounds  - 0 Application of Medications - topical  - 0 Application of Medications - injection INTERVENTIONS - Miscellaneous  - 0 External ear exam  - 0 Specimen Collection (cultures, biopsies, blood, body fluids, etc.)  - 0 Specimen(s) / Culture(s) sent or taken to Lab for analysis  - 0 Patient Transfer (multiple staff / Nurse, adult / Similar devices)  - 0 Simple Staple / Suture removal (25 or less)  - 0 Complex Staple / Suture removal (26 or more)  - 0 Hypo / Hyperglycemic Management (close monitor of Blood Glucose)  - 0 Ankle / Brachial Index (ABI) - do not check if billed separately X- 1 5 Vital Signs Has the patient been seen at the hospital within the last three years: Yes Total Score: 80 Level Of Care: New/Established - Level 3 Electronic Signature(s) Signed: 01/25/2021 7:11:13 PM By: Zandra Abts RN, BSN Entered By: Zandra Abts on 01/25/2021 17:56:07 -------------------------------------------------------------------------------- Encounter Discharge Information Details Patient Name: Date of Service: [name], [name] 01/25/2021 10:00 A M Medical Record Number: [id number] Patient Account Number: [id number] Date of Birth/Sex: Treating RN: [birth year] (59 y.o. [name] Primary Care Viaan Knippenberg: [name] Other  Clinician: Referring Manasi Dishon: Treating Dwayne Begay/Extender: [name] in Treatment: 11 Encounter Discharge Information Items Discharge Condition: Stable Ambulatory Status: Ambulatory Discharge Destination: Home Transportation: Private Auto Accompanied By: alone Schedule Follow-up Appointment: Yes Clinical Summary of Care: Patient Declined Electronic Signature(s) Signed: 01/25/2021 7:11:13 PM By: Zandra Abts RN, BSN Entered By: Zandra Abts on 01/25/2021 17:57:03 -------------------------------------------------------------------------------- Lower Extremity Assessment Details Patient Name: Date of Service: [name], [name] 01/25/2021 10:00 A M Medical Record Number: [id number] Patient Account Number: [id number] Date of Birth/Sex: Treating RN: 13-Jan-1961 (59 y.o. [name], [name] Primary Care Julya Alioto: [name] Other Clinician: Referring Aidynn Krenn: Treating Deno Sida/Extender: [name] in Treatment: 11 Edema Assessment Assessed: Kyra Searles: No] Franne Forts: Yes] Edema: [Left: N] [Right: o] Calf Left: Right: Point of Measurement: 37 cm From Medial Instep 30 cm Ankle Left: Right: Point of Measurement: 7 cm From Medial Instep 19.5 cm Knee To Floor Left: Right: From Medial Instep 44 cm Vascular Assessment Pulses: Dorsalis Pedis Palpable: [Right:Yes] Electronic Signature(s) Signed: 01/25/2021 6:50:03 PM By: Shawn Stall Signed: 01/25/2021 7:11:13 PM By: Zandra Abts RN, BSN Entered By: Zandra Abts on 01/25/2021 10:57:41 -------------------------------------------------------------------------------- Multi Wound Chart Details Patient Name: Date of Service: [name], [name] 01/25/2021 10:00 A M Medical Record Number: [id number] Patient Account Number: [id number] Date of Birth/Sex: Treating RN: [birth year] (59 y.o. [name] Primary Care Machel Violante: [name] Other Clinician: Referring Khadir Roam: Treating Ulices Maack/Extender:  [name] in Treatment: 11 Vital Signs Height(in): 71 Pulse(bpm): 71 Weight(lbs): 200 Blood Pressure(mmHg): 135/74 Body Mass Index(BMI): 28 Temperature(F): 99.4 Respiratory Rate(breaths/min): 16 Photos: [3:No Photos Right, Medial Lower Leg] [N/A:N/A N/A] Wound Location: [3:Gradually Appeared] [N/A:N/A] Wounding Event: [3:Venous Leg Ulcer] [N/A:N/A] Primary Etiology: [3:07/23/2020] [N/A:N/A] Date Acquired: [3:11] [N/A:N/A] Weeks of Treatment: [3:Healed - Epithelialized] [N/A:N/A] Wound Status: [  3:0x0x0] [N/A:N/A] Measurements L x W x D (cm) [3:0] [N/A:N/A] A (cm) : rea [3:0] [N/A:N/A] Volume (cm) : [3:100.00%] [N/A:N/A] % Reduction in A rea: [3:100.00%] [N/A:N/A] % Reduction in Volume: [3:Full Thickness With Exposed Support] [N/A:N/A] Classification: [3:Structures] Treatment Notes Electronic Signature(s) Signed: 01/25/2021 5:13:41 PM By: Baltazar Najjar MD Signed: 01/25/2021 7:11:13 PM By: Zandra Abts RN, BSN Entered By: Baltazar Najjar on 01/25/2021 11:10:53 -------------------------------------------------------------------------------- Multi-Disciplinary Care Plan Details Patient Name: Date of Service: [name], [name] 01/25/2021 10:00 A M Medical Record Number: [id number] Patient Account Number: [id number] Date of Birth/Sex: Treating RN: 12-10-1960 (59 y.o. [name] Primary Care Verdell Dykman: [name] Other Clinician: Referring Jakobie Henslee: Treating Ellean Firman/Extender: [name] in Treatment: 11 Active Inactive Electronic Signature(s) Signed: 01/25/2021 7:11:13 PM By: Zandra Abts RN, BSN Entered By: Zandra Abts on 01/25/2021 17:52:09 -------------------------------------------------------------------------------- Pain Assessment Details Patient Name: Date of Service: [name], [name] 01/25/2021 10:00 A M Medical Record Number: [id number] Patient Account Number: [id number] Date of Birth/Sex: Treating  RN: [birth year] (59 y.o. [name] Primary Care Jeremie Giangrande: [name] Other Clinician: Referring Desere Gwin: Treating Roxy Mastandrea/Extender: [name] in Treatment: 11 Active Problems Location of Pain Severity and Description of Pain Patient Has Paino No Site Locations Rate the pain. Current Pain Level: 0 Pain Management and Medication Current Pain Management: Medication: No Cold Application: No Rest: No Massage: No Activity: No T.E.N.S.: No Heat Application: No Leg drop or elevation: No Is the Current Pain Management Adequate: Adequate How does your wound impact your activities of daily livingo Sleep: No Bathing: No Appetite: No Relationship With Others: No Bladder Continence: No Emotions: No Bowel Continence: No Work: No Toileting: No Drive: No Dressing: No Hobbies: No Electronic Signature(s) Signed: 01/25/2021 6:50:03 PM By: Shawn Stall Entered By: Shawn Stall on 01/25/2021 10:38:41 -------------------------------------------------------------------------------- Patient/Caregiver Education Details Patient Name: Date of Service: [name], [name] 7/6/2022andnbsp10:00 A M Medical Record Number: [id number] Patient Account Number: [id number] Date of Birth/Gender: Treating RN: 1961/03/20 (60 y.o. [name] Primary Care Physician: [name] Other Clinician: Referring Physician: Treating Physician/Extender: [name] in Treatment: 11 Education Assessment Education Provided To: Patient Education Topics Provided Wound/Skin Impairment: Methods: Explain/Verbal Responses: State content correctly [employer]) Signed: 01/25/2021 7:11:13 PM By: Zandra Abts RN, BSN Entered By: Zandra Abts on 01/25/2021 17:55:05 -------------------------------------------------------------------------------- Wound Assessment Details Patient Name: Date of Service: [name], [name] 01/25/2021 10:00 A M Medical  Record Number: [id number] Patient Account Number: [id number] Date of Birth/Sex: Treating RN: [birth year] (59 y.o. [name], Millard.Loa Primary Care Eun Vermeer: [name] Other Clinician: Referring Ozzy Bohlken: Treating Bascom Biel/Extender: [name] in Treatment: 11 Wound Status Wound Number: 3 Primary Etiology: Venous Leg Ulcer Wound Location: Right, Medial Lower Leg Wound Status: Healed - Epithelialized Wounding Event: Gradually Appeared Date Acquired: 07/23/2020 Weeks Of Treatment: 11 Clustered Wound: No Wound Measurements Length: (cm) Width: (cm) Depth: (cm) Area: (cm) Volume: (cm) 0 % Reduction in Area: 100% 0 % Reduction in Volume: 100% 0 0 0 Wound Description Classification: Full Thickness With Exposed Support Structures Electronic Signature(s) Signed: 01/25/2021 6:50:03 PM By: Shawn Stall Entered By: Doyle Askew on 01/25/2021 10:38:58 -------------------------------------------------------------------------------- Vitals Details Patient Name: Date of Service: [name], [name] 01/25/2021 10:00 A M Medical Record Number: [id number] Patient Account Number: [id number] Date of Birth/Sex: Treating RN: 1960/08/08 (59 y.o. [name] Primary Care Jovante Hammitt: [name] Other Clinician: Referring Teneka Malmberg: Treating Alazay Leicht/Extender: [name] in Treatment: [id number]  Vital Signs Time Taken: 10:30 Temperature (F): 99.4 Height (in): 71 Pulse (bpm): 71 Weight (lbs): 200 Respiratory Rate (breaths/min): 16 Body Mass Index (BMI): 27.9 Blood Pressure (mmHg): 135/74 Reference Range: 80 - 120 mg / dl Electronic Signature(s) Signed: 01/25/2021 6:50:03 PM By: Shawn Stall Entered By: Shawn Stall on 01/25/2021 10:38:31
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
## 2021-03-22 ENCOUNTER — Inpatient Hospital Stay (HOSPITAL_COMMUNITY)
Admission: EM | Admit: 2021-03-22 | Discharge: 2021-03-27 | DRG: 812 | Disposition: A | Discharge status: Home / Self Care | Payer: Medicare Other | Attending: Internal Medicine | Admitting: Internal Medicine

## 2021-03-22 ENCOUNTER — Other Ambulatory Visit: Payer: Self-pay

## 2021-03-22 ENCOUNTER — Encounter (HOSPITAL_COMMUNITY): Payer: Self-pay

## 2021-03-22 ENCOUNTER — Emergency Department (HOSPITAL_COMMUNITY): Payer: Medicare Other

## 2021-03-22 DIAGNOSIS — Z79899 Other long term (current) drug therapy: Secondary | ICD-10-CM

## 2021-03-22 DIAGNOSIS — D571 Sickle-cell disease without crisis: Secondary | ICD-10-CM | POA: Insufficient documentation

## 2021-03-22 DIAGNOSIS — F419 Anxiety disorder, unspecified: Secondary | ICD-10-CM | POA: Diagnosis present

## 2021-03-22 DIAGNOSIS — F329 Major depressive disorder, single episode, unspecified: Secondary | ICD-10-CM | POA: Diagnosis present

## 2021-03-22 DIAGNOSIS — Z832 Family history of diseases of the blood and blood-forming organs and certain disorders involving the immune mechanism: Secondary | ICD-10-CM

## 2021-03-22 DIAGNOSIS — Z79890 Hormone replacement therapy (postmenopausal): Secondary | ICD-10-CM

## 2021-03-22 DIAGNOSIS — Z830 Family history of human immunodeficiency virus [HIV] disease: Secondary | ICD-10-CM

## 2021-03-22 DIAGNOSIS — B1920 Unspecified viral hepatitis C without hepatic coma: Secondary | ICD-10-CM | POA: Diagnosis present

## 2021-03-22 DIAGNOSIS — E079 Disorder of thyroid, unspecified: Secondary | ICD-10-CM | POA: Diagnosis present

## 2021-03-22 DIAGNOSIS — Z885 Allergy status to narcotic agent status: Secondary | ICD-10-CM

## 2021-03-22 DIAGNOSIS — Z881 Allergy status to other antibiotic agents status: Secondary | ICD-10-CM

## 2021-03-22 DIAGNOSIS — F17200 Nicotine dependence, unspecified, uncomplicated: Secondary | ICD-10-CM

## 2021-03-22 DIAGNOSIS — D638 Anemia in other chronic diseases classified elsewhere: Secondary | ICD-10-CM | POA: Diagnosis present

## 2021-03-22 DIAGNOSIS — D5700 Hb-SS disease with crisis, unspecified: Secondary | ICD-10-CM | POA: Diagnosis not present

## 2021-03-22 DIAGNOSIS — M069 Rheumatoid arthritis, unspecified: Secondary | ICD-10-CM | POA: Diagnosis present

## 2021-03-22 DIAGNOSIS — Z20822 Contact with and (suspected) exposure to covid-19: Secondary | ICD-10-CM | POA: Diagnosis present

## 2021-03-22 DIAGNOSIS — J9601 Acute respiratory failure with hypoxia: Secondary | ICD-10-CM | POA: Diagnosis present

## 2021-03-22 DIAGNOSIS — D649 Anemia, unspecified: Secondary | ICD-10-CM

## 2021-03-22 DIAGNOSIS — E039 Hypothyroidism, unspecified: Secondary | ICD-10-CM | POA: Diagnosis present

## 2021-03-22 DIAGNOSIS — Z7952 Long term (current) use of systemic steroids: Secondary | ICD-10-CM

## 2021-03-22 LAB — CBC WITH DIFFERENTIAL/PLATELET
Abs Immature Granulocytes: 0.29 10*3/uL — ABNORMAL HIGH (ref 0.00–0.07)
Basophils Absolute: 0 10*3/uL (ref 0.0–0.1)
Basophils Relative: 0 %
Eosinophils Absolute: 0 10*3/uL (ref 0.0–0.5)
Eosinophils Relative: 0 %
HCT: 15.9 % — ABNORMAL LOW (ref 36.0–46.0)
Hemoglobin: 5.6 g/dL — CL (ref 12.0–15.0)
Immature Granulocytes: 3 %
Lymphocytes Relative: 15 %
Lymphs Abs: 1.5 10*3/uL (ref 0.7–4.0)
MCH: 31.8 pg (ref 26.0–34.0)
MCHC: 35.2 g/dL (ref 30.0–36.0)
MCV: 90.3 fL (ref 80.0–100.0)
Monocytes Absolute: 0.5 10*3/uL (ref 0.1–1.0)
Monocytes Relative: 5 %
Neutro Abs: 7.9 10*3/uL — ABNORMAL HIGH (ref 1.7–7.7)
Neutrophils Relative %: 77 %
Platelets: 158 10*3/uL (ref 150–400)
RBC: 1.76 MIL/uL — ABNORMAL LOW (ref 3.87–5.11)
RDW: 24.6 % — ABNORMAL HIGH (ref 11.5–15.5)
WBC: 10.3 10*3/uL (ref 4.0–10.5)
nRBC: 13.1 % — ABNORMAL HIGH (ref 0.0–0.2)

## 2021-03-22 LAB — COMPREHENSIVE METABOLIC PANEL
ALT: 11 U/L (ref 0–44)
AST: 55 U/L — ABNORMAL HIGH (ref 15–41)
Albumin: 4.1 g/dL (ref 3.5–5.0)
Alkaline Phosphatase: 75 U/L (ref 38–126)
Anion gap: 8 (ref 5–15)
BUN: 8 mg/dL (ref 6–20)
CO2: 24 mmol/L (ref 22–32)
Calcium: 9.6 mg/dL (ref 8.9–10.3)
Chloride: 109 mmol/L (ref 98–111)
Creatinine, Ser: 0.63 mg/dL (ref 0.44–1.00)
GFR, Estimated: >60 mL/min (ref >60)
Glucose, Bld: 144 mg/dL — ABNORMAL HIGH (ref 70–99)
Potassium: 3.7 mmol/L (ref 3.5–5.1)
Sodium: 141 mmol/L (ref 135–145)
Total Bilirubin: 4.3 mg/dL — ABNORMAL HIGH (ref 0.3–1.2)
Total Protein: 8.8 g/dL — ABNORMAL HIGH (ref 6.5–8.1)

## 2021-03-22 LAB — RESP PANEL BY RT-PCR (FLU A&B, COVID) ARPGX2
Influenza A by PCR: NEGATIVE
Influenza B by PCR: NEGATIVE
SARS Coronavirus 2 by RT PCR: NEGATIVE

## 2021-03-22 LAB — RETICULOCYTES
Immature Retic Fract: 36.4 % — ABNORMAL HIGH (ref 2.3–15.9)
RBC.: 1.74 MIL/uL — ABNORMAL LOW (ref 3.87–5.11)
Retic Count, Absolute: 370.5 10*3/uL — ABNORMAL HIGH (ref 19.0–186.0)
Retic Ct Pct: 20 % — ABNORMAL HIGH (ref 0.4–3.1)

## 2021-03-22 MED ORDER — HYDROMORPHONE HCL 1 MG/ML IJ SOLN: 0.5000 mg | Freq: Once | INTRAMUSCULAR | Status: DC

## 2021-03-22 MED ORDER — MORPHINE SULFATE (PF) 4 MG/ML IV SOLN
4.0000 mg | INTRAVENOUS | Status: DC | PRN
  Administered 2021-03-23: 4 mg via INTRAVENOUS
  Filled 2021-03-22: qty 1

## 2021-03-22 MED ORDER — ONDANSETRON HCL 4 MG/2ML IJ SOLN
4.0000 mg | INTRAMUSCULAR | Status: DC | PRN
  Administered 2021-03-22 (×2): 4 mg via INTRAVENOUS
  Filled 2021-03-22 (×2): qty 2

## 2021-03-22 MED ORDER — SODIUM CHLORIDE 0.45 % IV SOLN: INTRAVENOUS | Status: DC

## 2021-03-22 MED ORDER — SODIUM CHLORIDE 0.9 % IV SOLN
12.5000 mg | Freq: Four times a day (QID) | INTRAVENOUS | Status: DC | PRN
  Administered 2021-03-23: 12.5 mg via INTRAVENOUS
  Filled 2021-03-22: qty 12.5

## 2021-03-22 MED ORDER — HYDROMORPHONE HCL 1 MG/ML IJ SOLN
1.0000 mg | INTRAMUSCULAR | Status: AC
  Administered 2021-03-22: 1 mg via INTRAVENOUS
  Filled 2021-03-22: qty 1

## 2021-03-22 MED ORDER — METOCLOPRAMIDE HCL 5 MG/ML IJ SOLN
10.0000 mg | Freq: Once | INTRAMUSCULAR | Status: AC
  Administered 2021-03-22: 10 mg via INTRAVENOUS
  Filled 2021-03-22: qty 2

## 2021-03-22 MED ORDER — HYDROMORPHONE HCL 1 MG/ML IJ SOLN
0.5000 mg | INTRAMUSCULAR | Status: AC
Start: 2021-03-22 — End: 2021-03-22
  Administered 2021-03-22: 0.5 mg via INTRAVENOUS
  Filled 2021-03-22: qty 1

## 2021-03-22 MED ORDER — KETOROLAC TROMETHAMINE 15 MG/ML IJ SOLN: 15.0000 mg | Freq: Once | INTRAMUSCULAR | Status: DC

## 2021-03-22 NOTE — ED Triage Notes (Signed)
Pt states she is having a flare up with her sickle cell. Pt states she is having extreme back pain. Pt states she took tylenol with no relief.

## 2021-03-22 NOTE — ED Provider Notes (Signed)
[name]-EMERGENCY DEPT Provider Note   CSN: [id number] Arrival date & time: 03/22/21  1549     History Chief Complaint  Patient presents with   Sickle Cell Pain Crisis    [name] is a 60 y.o. female.  Patient is a 60 year old female with a history of sickle cell anemia, hypothyroidism, chronic anemia, hepatitis C and rheumatoid arthritis who is presenting today with complaints of back and chest pain.  Symptoms started earlier today.  Patient is not sure why they started she denies any recent illness, dehydration, exposure to the elements or overexertion.  However she is having low back pain and then pain around her chest.  The pain is sharp and searing and worse when she bends or takes a deep breath.  She feels slightly short of breath only because it is painful to take a deep breath.  She has not had a cough, fever, nasal congestion.  She denies any syncope, abdominal pain, nausea or vomiting.  She does report the pain in the back radiates around to the sides of her abdomen but she has no dysuria, frequency or urgency.  The pain does not radiate down her legs.  No joint swelling or discomfort.  No recent immobilization.  No prior history of PE.  Patient reports that her hemoglobin is usually in the 6 range and she was last transfused in June.  She did try to take her extra strength Tylenol which she normally does when she has pain but it did not work today.  The history is provided by the patient and medical records.  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   Thrombophlebitis leg superficial 03/04/2013   Rheumatoid arthritis (HCC) 12/16/2012   Hypothyroidism 11/27/2011    Past Surgical History:  Procedure Laterality Date   ANKLE SURGERY  1989 and 1990   CEREBRAL ANEURYSM REPAIR     COLONOSCOPY  05/12/2012   Procedure: COLONOSCOPY;  Surgeon: Hart Carwin, MD;  Location: [facility];  Service: Endoscopy;  Laterality: N/A;   ESOPHAGOGASTRODUODENOSCOPY  05/12/2012   Procedure: ESOPHAGOGASTRODUODENOSCOPY (EGD);  Surgeon: Hart Carwin, MD;  Location: [facility];  Service: Endoscopy;  Laterality: N/A;   gall stone removal     GIVENS CAPSULE STUDY  05/13/2012   Procedure: GIVENS CAPSULE STUDY;  Surgeon: Hart Carwin, MD;  Location: [facility];  Service: Endoscopy;  Laterality: N/A;     OB History   No obstetric history on file.     Family History  Problem Relation Age of Onset   Cancer Mother 22       Esophageal   Sickle cell trait Mother    Sickle cell trait Father    HIV/AIDS Brother    Colon cancer  Neg Hx     Social History   Tobacco Use   Smoking status: Never   Smokeless tobacco: Never  Vaping Use   Vaping Use: Never used  Substance Use Topics   Alcohol use: Not Currently    Comment: Occasional   Drug use: No    Home Medications Prior to Admission medications   Medication Sig Start Date End Date Taking? Authorizing Provider  albuterol (VENTOLIN HFA) 108 (90 Base) MCG/ACT inhaler Inhale 1-2 puffs into the lungs every 6 (six) hours as needed for wheezing or shortness of breath. 02/29/20  Yes [provider]  ascorbic acid (VITAMIN C) 500 MG tablet Take 500 mg by mouth daily.   Yes [provider]  cyclobenzaprine (FLEXERIL) 10 MG tablet Take 10 mg by mouth 2 (two) times daily as needed for muscle spasms. 05/19/20   Yes [provider]  DULoxetine (CYMBALTA) 30 MG capsule Take 30 mg by mouth daily. 12/26/20  Yes [provider]  gabapentin (NEURONTIN) 300 MG capsule Take 300 mg by mouth daily.   Yes [provider]  levothyroxine (SYNTHROID) 175 MCG tablet Take 175 mcg by mouth daily before breakfast.   Yes [provider]  montelukast (SINGULAIR) 10 MG tablet Take 10 mg by mouth daily.   Yes [provider]  omega-3 acid ethyl esters (LOVAZA) 1 g capsule Take 1 g by mouth daily. 05/18/20  Yes [provider]  predniSONE (DELTASONE) 10 MG tablet Take 10-30 mg by mouth as directed. TAKE 3 TABLETS BY MOUTH DAILY FOR WEEK 1; THEN 2 TABLETS DAILY FOR WEEK 2, THEN 1 TABLET DAILY FOR WEEK 3 03/20/21  Yes [provider]  VITAMIN D PO Take 1 capsule by mouth daily.   Yes [provider]    Allergies    Ampicillin and Demerol [meperidine]  Review of Systems   Review of Systems  All other systems reviewed and are negative.  Physical Exam Updated Vital Signs BP (!) 147/71   Pulse 61   Temp 97.9 F (36.6 C) (Oral)   Resp (!) 3   SpO2 93%   Physical Exam Vitals and nursing note reviewed.  Constitutional:      General: She is not in acute distress.    Appearance: She is well-developed.  HENT:     Head: Normocephalic and atraumatic.  Eyes:     Pupils: Pupils are equal, round, and reactive to light.  Cardiovascular:     Rate and Rhythm: Normal rate and regular rhythm.     Pulses: Normal pulses.     Heart sounds: Normal heart sounds. No murmur heard.   No friction rub.  Pulmonary:     Effort: Pulmonary effort is normal.     Breath sounds: Normal breath sounds. No wheezing or rales.     Comments: Tenderness with palpation over the sternum and lateral chest Chest:     Chest wall: Tenderness present.  Abdominal:     General: Bowel sounds are normal. There is no distension.     Palpations: Abdomen is soft.     Tenderness: There is  no abdominal tenderness. There is no guarding or rebound.     Comments: Pain with palpation along the sides of the abdomen but no true flank pain and no suprapubic pain  Musculoskeletal:        General: Tenderness present. Normal range of motion.     Cervical back: Normal range of motion and neck supple. No tenderness.  Lumbar back: Tenderness present. No bony tenderness. Normal range of motion.       Back:     Right lower leg: No edema.     Left lower leg: No edema.     Comments: No edema  Skin:    General: Skin is warm and dry.     Findings: No rash.  Neurological:     Mental Status: She is alert and oriented to person, place, and time. Mental status is at baseline.     Cranial Nerves: No cranial nerve deficit.  Psychiatric:        Mood and Affect: Mood normal.        Behavior: Behavior normal.    ED Results / Procedures / Treatments   Labs (all labs ordered are listed, but only abnormal results are displayed) Labs Reviewed  COMPREHENSIVE METABOLIC PANEL - Abnormal; Notable for the following components:      Result Value   Glucose, Bld 144 (*)    Total Protein 8.8 (*)    AST 55 (*)    Total Bilirubin 4.3 (*)    All other components within normal limits  CBC WITH DIFFERENTIAL/PLATELET - Abnormal; Notable for the following components:   RBC 1.76 (*)    Hemoglobin 5.6 (*)    HCT 15.9 (*)    RDW 24.6 (*)    nRBC 13.1 (*)    Neutro Abs 7.9 (*)    Abs Immature Granulocytes 0.29 (*)    All other components within normal limits  RETICULOCYTES - Abnormal; Notable for the following components:   Retic Ct Pct 20.0 (*)    RBC. 1.74 (*)    Retic Count, Absolute 370.5 (*)    Immature Retic Fract 36.4 (*)    All other components within normal limits  RESP PANEL BY RT-PCR (FLU A&B, COVID) [id number]  TYPE AND SCREEN    EKG EKG Interpretation  Date/Time:  Wednesday March 22 2021 16:53:05 EDT Ventricular Rate:  66 PR Interval:  193 QRS Duration: 98 QT Interval:  476 QTC  Calculation: 503 R Axis:   39 Text Interpretation: Sinus rhythm Low voltage, extremity leads Nonspecific T abnrm, anterolateral leads new Borderline prolonged QT interval Confirmed by Gwyneth Sprout ([id number]) on 03/22/2021 5:33:50 PM  Radiology DG Chest Port 1 View  Result Date: 03/22/2021 CLINICAL DATA:  History of sickle cell disease with chest pain. EXAM: PORTABLE CHEST 1 VIEW COMPARISON:  January 07, 2020 FINDINGS: There is no evidence of acute infiltrate, pleural effusion or pneumothorax. The cardiac silhouette is mildly enlarged. The visualized skeletal structures are unremarkable. IMPRESSION: No active cardiopulmonary disease. Electronically Signed   By: Aram Candela M.D.   On: 03/22/2021 17:28    Procedures Procedures   Medications Ordered in ED Medications  0.45 % sodium chloride infusion ( Intravenous New Bag/Given 03/22/21 1711)  ondansetron (ZOFRAN) injection 4 mg (4 mg Intravenous Given 03/22/21 1857)  metoCLOPramide (REGLAN) injection 10 mg (has no administration in time range)  HYDROmorphone (DILAUDID) injection 0.5 mg (0.5 mg Intravenous Given 03/22/21 1717)  HYDROmorphone (DILAUDID) injection 1 mg (1 mg Intravenous Given 03/22/21 1748)    ED Course  I have reviewed the triage vital signs and the nursing notes.  Pertinent labs & imaging results that were available during my care of the patient were reviewed by me and considered in my medical decision making (see chart for details).    MDM Rules/Calculators/A&P  60 year old female with a history of sickle cell disease who is presenting today with low back pain and chest pain and concern for sickle cell crisis.  Patient reports this is usually what happens when she becomes anemic.  Patient vital signs are reassuring.  Oxygen saturation of 99%.  She is having chest pain but denies symptoms concerning for acute chest as she has no fever, URI symptoms.  No prior history of PE and patient is not  tachycardic.  We will continue to follow.  Patient given pain control.  CXR, EKG and Labs pending.  We will start maintenance fluids.  8:08 PM Patient has received multiple rounds of pain medication but it has made her repetitively vomit.  She states the pain is little better but still present.  Hemoglobin today of 5.6 from what appears to be her baseline of 6.  Chest x-ray and EKG without acute findings. However given her ongoing pain will admit for pain crisis.  MDM   Amount and/or Complexity of Data Reviewed Clinical lab tests: ordered and reviewed Tests in the radiology section of CPT: ordered and reviewed Tests in the medicine section of CPT: ordered and reviewed Independent visualization of images, tracings, or specimens: yes  Risk of Complications, Morbidity, and/or Mortality Presenting problems: moderate Diagnostic procedures: moderate Management options: moderate  Patient Progress Patient progress: stable    Final Clinical Impression(s) / ED Diagnoses Final diagnoses:  Sickle cell pain crisis (HCC)  Anemia, unspecified type    Rx / DC Orders ED Discharge Orders     None        Gwyneth Sprout, MD 03/22/21 2011

## 2021-03-22 NOTE — ED Notes (Signed)
Desatted to 88. [facility]

## 2021-03-22 NOTE — ED Provider Notes (Signed)
Emergency Medicine Provider Triage Evaluation Note  [name] , a 60 y.o. female  was evaluated in triage.  Pt complains of pain all over from her sickle cell.  She says that her pain at this moment feels exactly like her sickle cell crises.  Endorsing some chest pain.  No dizziness, nausea.  Usually takes 500 mg Tylenol for her pain however this did not work this morning at 11 AM when this began.  Review of Systems  Positive: Pain, some CP Negative: SOB, h/a, pain in legs  Physical Exam  BP (!) 144/84   Pulse 70   Temp 97.9 F (36.6 C) (Oral)   Resp 18   SpO2 99%  Gen:   Awake, in moderate distress Resp:  Normal effort CTA MSK:   Moves extremities without difficulty  Other:    Medical Decision Making  Medically screening exam initiated at 4:03 PM.  Appropriate orders placed.  [name] was informed that the remainder of the evaluation will be completed by another provider, this initial triage assessment does not replace that evaluation, and the importance of remaining in the ED until their evaluation is complete.     [name] 03/22/21 1605    Cheryll Cockayne, MD 03/27/21 [phone]

## 2021-03-23 DIAGNOSIS — Z885 Allergy status to narcotic agent status: Secondary | ICD-10-CM | POA: Diagnosis not present

## 2021-03-23 DIAGNOSIS — J9601 Acute respiratory failure with hypoxia: Secondary | ICD-10-CM | POA: Insufficient documentation

## 2021-03-23 DIAGNOSIS — Z79890 Hormone replacement therapy (postmenopausal): Secondary | ICD-10-CM | POA: Diagnosis not present

## 2021-03-23 DIAGNOSIS — Z7952 Long term (current) use of systemic steroids: Secondary | ICD-10-CM | POA: Diagnosis not present

## 2021-03-23 DIAGNOSIS — D638 Anemia in other chronic diseases classified elsewhere: Secondary | ICD-10-CM | POA: Diagnosis not present

## 2021-03-23 DIAGNOSIS — Z881 Allergy status to other antibiotic agents status: Secondary | ICD-10-CM | POA: Diagnosis not present

## 2021-03-23 DIAGNOSIS — Z832 Family history of diseases of the blood and blood-forming organs and certain disorders involving the immune mechanism: Secondary | ICD-10-CM | POA: Diagnosis not present

## 2021-03-23 DIAGNOSIS — D571 Sickle-cell disease without crisis: Secondary | ICD-10-CM

## 2021-03-23 DIAGNOSIS — D649 Anemia, unspecified: Secondary | ICD-10-CM | POA: Diagnosis not present

## 2021-03-23 DIAGNOSIS — B1920 Unspecified viral hepatitis C without hepatic coma: Secondary | ICD-10-CM | POA: Diagnosis present

## 2021-03-23 DIAGNOSIS — E079 Disorder of thyroid, unspecified: Secondary | ICD-10-CM | POA: Diagnosis not present

## 2021-03-23 DIAGNOSIS — Z20822 Contact with and (suspected) exposure to covid-19: Secondary | ICD-10-CM | POA: Diagnosis present

## 2021-03-23 DIAGNOSIS — Z830 Family history of human immunodeficiency virus [HIV] disease: Secondary | ICD-10-CM | POA: Diagnosis not present

## 2021-03-23 DIAGNOSIS — Z79899 Other long term (current) drug therapy: Secondary | ICD-10-CM | POA: Diagnosis not present

## 2021-03-23 DIAGNOSIS — F329 Major depressive disorder, single episode, unspecified: Secondary | ICD-10-CM | POA: Diagnosis present

## 2021-03-23 DIAGNOSIS — M069 Rheumatoid arthritis, unspecified: Secondary | ICD-10-CM | POA: Diagnosis present

## 2021-03-23 DIAGNOSIS — D5700 Hb-SS disease with crisis, unspecified: Secondary | ICD-10-CM | POA: Diagnosis present

## 2021-03-23 DIAGNOSIS — F419 Anxiety disorder, unspecified: Secondary | ICD-10-CM | POA: Diagnosis present

## 2021-03-23 LAB — CBC
HCT: 18.8 % — ABNORMAL LOW (ref 36.0–46.0)
Hemoglobin: 6.7 g/dL — CL (ref 12.0–15.0)
MCH: 31.2 pg (ref 26.0–34.0)
MCHC: 35.6 g/dL (ref 30.0–36.0)
MCV: 87.4 fL (ref 80.0–100.0)
Platelets: 118 10*3/uL — ABNORMAL LOW (ref 150–400)
RBC: 2.15 MIL/uL — ABNORMAL LOW (ref 3.87–5.11)
RDW: 20.3 % — ABNORMAL HIGH (ref 11.5–15.5)
WBC: 11.7 10*3/uL — ABNORMAL HIGH (ref 4.0–10.5)
nRBC: 21.4 % — ABNORMAL HIGH (ref 0.0–0.2)

## 2021-03-23 LAB — CBC WITH DIFFERENTIAL/PLATELET
Abs Immature Granulocytes: 0.42 10*3/uL — ABNORMAL HIGH (ref 0.00–0.07)
Basophils Absolute: 0 10*3/uL (ref 0.0–0.1)
Basophils Relative: 0 %
Eosinophils Absolute: 0 10*3/uL (ref 0.0–0.5)
Eosinophils Relative: 0 %
HCT: 15.6 % — ABNORMAL LOW (ref 36.0–46.0)
Hemoglobin: 5.4 g/dL — CL (ref 12.0–15.0)
Immature Granulocytes: 3 %
Lymphocytes Relative: 25 %
Lymphs Abs: 3.4 10*3/uL (ref 0.7–4.0)
MCH: 30.9 pg (ref 26.0–34.0)
MCHC: 34.6 g/dL (ref 30.0–36.0)
MCV: 89.1 fL (ref 80.0–100.0)
Monocytes Absolute: 1.5 10*3/uL — ABNORMAL HIGH (ref 0.1–1.0)
Monocytes Relative: 11 %
Neutro Abs: 8.4 10*3/uL — ABNORMAL HIGH (ref 1.7–7.7)
Neutrophils Relative %: 61 %
Platelets: 112 10*3/uL — ABNORMAL LOW (ref 150–400)
RBC: 1.75 MIL/uL — ABNORMAL LOW (ref 3.87–5.11)
RDW: 22.5 % — ABNORMAL HIGH (ref 11.5–15.5)
WBC: 13.7 10*3/uL — ABNORMAL HIGH (ref 4.0–10.5)
nRBC: 13.6 % — ABNORMAL HIGH (ref 0.0–0.2)

## 2021-03-23 LAB — COMPREHENSIVE METABOLIC PANEL
ALT: 11 U/L (ref 0–44)
AST: 46 U/L — ABNORMAL HIGH (ref 15–41)
Albumin: 3.8 g/dL (ref 3.5–5.0)
Alkaline Phosphatase: 54 U/L (ref 38–126)
Anion gap: 7 (ref 5–15)
BUN: 7 mg/dL (ref 6–20)
CO2: 26 mmol/L (ref 22–32)
Calcium: 9.3 mg/dL (ref 8.9–10.3)
Chloride: 109 mmol/L (ref 98–111)
Creatinine, Ser: 0.79 mg/dL (ref 0.44–1.00)
GFR, Estimated: >60 mL/min (ref >60)
Glucose, Bld: 78 mg/dL (ref 70–99)
Potassium: 3.6 mmol/L (ref 3.5–5.1)
Sodium: 142 mmol/L (ref 135–145)
Total Bilirubin: 4.4 mg/dL — ABNORMAL HIGH (ref 0.3–1.2)
Total Protein: 8 g/dL (ref 6.5–8.1)

## 2021-03-23 LAB — HIV ANTIBODY (ROUTINE TESTING W REFLEX): HIV Screen 4th Generation wRfx: NONREACTIVE

## 2021-03-23 LAB — MAGNESIUM: Magnesium: 2.2 mg/dL (ref 1.7–2.4)

## 2021-03-23 LAB — PREPARE RBC (CROSSMATCH)

## 2021-03-23 MED ORDER — FAMOTIDINE IN NACL 20-0.9 MG/50ML-% IV SOLN
20.0000 mg | Freq: Two times a day (BID) | INTRAVENOUS | Status: DC
  Administered 2021-03-24: 20 mg via INTRAVENOUS
  Filled 2021-03-23 (×3): qty 50

## 2021-03-23 MED ORDER — PREDNISONE 20 MG PO TABS: 10.0000 mg | ORAL_TABLET | ORAL | Status: DC

## 2021-03-23 MED ORDER — SENNOSIDES-DOCUSATE SODIUM 8.6-50 MG PO TABS
1.0000 | ORAL_TABLET | Freq: Two times a day (BID) | ORAL | Status: DC
  Administered 2021-03-23 – 2021-03-27 (×10): 1 via ORAL
  Filled 2021-03-23 (×10): qty 1

## 2021-03-23 MED ORDER — DULOXETINE HCL 30 MG PO CPEP
30.0000 mg | ORAL_CAPSULE | Freq: Every day | ORAL | Status: DC
  Administered 2021-03-23 – 2021-03-27 (×5): 30 mg via ORAL
  Filled 2021-03-23 (×5): qty 1

## 2021-03-23 MED ORDER — KETOROLAC TROMETHAMINE 30 MG/ML IJ SOLN
15.0000 mg | Freq: Four times a day (QID) | INTRAMUSCULAR | Status: DC
  Administered 2021-03-23 – 2021-03-27 (×16): 15 mg via INTRAVENOUS
  Filled 2021-03-23 (×16): qty 1

## 2021-03-23 MED ORDER — FAMOTIDINE 20 MG PO TABS
20.0000 mg | ORAL_TABLET | Freq: Two times a day (BID) | ORAL | Status: DC
  Administered 2021-03-23 – 2021-03-27 (×9): 20 mg via ORAL
  Filled 2021-03-23 (×10): qty 1

## 2021-03-23 MED ORDER — NALOXONE HCL 0.4 MG/ML IJ SOLN: 0.4000 mg | INTRAMUSCULAR | Status: DC | PRN

## 2021-03-23 MED ORDER — PROMETHAZINE HCL 25 MG PO TABS: 12.5000 mg | ORAL_TABLET | ORAL | Status: DC | PRN

## 2021-03-23 MED ORDER — HEPARIN SODIUM (PORCINE) 5000 UNIT/ML IJ SOLN
5000.0000 [IU] | Freq: Three times a day (TID) | INTRAMUSCULAR | Status: DC
  Administered 2021-03-23 – 2021-03-27 (×13): 5000 [IU] via SUBCUTANEOUS
  Filled 2021-03-23 (×12): qty 1

## 2021-03-23 MED ORDER — SODIUM CHLORIDE 0.9% IV SOLUTION: Freq: Once | INTRAVENOUS | Status: AC

## 2021-03-23 MED ORDER — SODIUM CHLORIDE 0.9% FLUSH: 9.0000 mL | INTRAVENOUS | Status: DC | PRN

## 2021-03-23 MED ORDER — ALBUTEROL SULFATE HFA 108 (90 BASE) MCG/ACT IN AERS: 1.0000 | INHALATION_SPRAY | Freq: Four times a day (QID) | RESPIRATORY_TRACT | Status: DC | PRN

## 2021-03-23 MED ORDER — DIPHENHYDRAMINE HCL 25 MG PO CAPS: 25.0000 mg | ORAL_CAPSULE | ORAL | Status: DC | PRN

## 2021-03-23 MED ORDER — ACETAMINOPHEN 325 MG PO TABS
650.0000 mg | ORAL_TABLET | Freq: Once | ORAL | Status: AC
  Administered 2021-03-23: 650 mg via ORAL
  Filled 2021-03-23: qty 2

## 2021-03-23 MED ORDER — PROMETHAZINE HCL 12.5 MG RE SUPP
12.5000 mg | RECTAL | Status: DC | PRN
  Filled 2021-03-23: qty 2

## 2021-03-23 MED ORDER — SODIUM CHLORIDE 0.9 % IV SOLN
25.0000 mg | INTRAVENOUS | Status: DC | PRN
  Filled 2021-03-23: qty 0.5

## 2021-03-23 MED ORDER — MONTELUKAST SODIUM 10 MG PO TABS
10.0000 mg | ORAL_TABLET | Freq: Every day | ORAL | Status: DC
  Administered 2021-03-23 – 2021-03-27 (×5): 10 mg via ORAL
  Filled 2021-03-23 (×5): qty 1

## 2021-03-23 MED ORDER — GABAPENTIN 300 MG PO CAPS
300.0000 mg | ORAL_CAPSULE | Freq: Every day | ORAL | Status: DC
  Administered 2021-03-23 – 2021-03-27 (×5): 300 mg via ORAL
  Filled 2021-03-23 (×5): qty 1

## 2021-03-23 MED ORDER — PREDNISONE 5 MG PO TABS
30.0000 mg | ORAL_TABLET | Freq: Every day | ORAL | Status: DC
  Administered 2021-03-23: 30 mg via ORAL
  Filled 2021-03-23: qty 2

## 2021-03-23 MED ORDER — DIPHENHYDRAMINE HCL 50 MG/ML IJ SOLN
25.0000 mg | Freq: Once | INTRAMUSCULAR | Status: AC
  Administered 2021-03-23: 25 mg via INTRAVENOUS
  Filled 2021-03-23: qty 1

## 2021-03-23 MED ORDER — SODIUM CHLORIDE 0.9% IV SOLUTION
Freq: Once | INTRAVENOUS | Status: AC
Start: 2021-03-23 — End: 2021-03-23

## 2021-03-23 MED ORDER — ALBUTEROL SULFATE (2.5 MG/3ML) 0.083% IN NEBU: 2.5000 mg | INHALATION_SOLUTION | Freq: Four times a day (QID) | RESPIRATORY_TRACT | Status: DC | PRN

## 2021-03-23 MED ORDER — POLYETHYLENE GLYCOL 3350 17 G PO PACK
17.0000 g | PACK | Freq: Every day | ORAL | Status: DC | PRN
  Administered 2021-03-26: 17 g via ORAL
  Filled 2021-03-23: qty 1

## 2021-03-23 MED ORDER — LEVOTHYROXINE SODIUM 50 MCG PO TABS
175.0000 ug | ORAL_TABLET | Freq: Every day | ORAL | Status: DC
  Administered 2021-03-23 – 2021-03-27 (×5): 175 ug via ORAL
  Filled 2021-03-23 (×5): qty 1

## 2021-03-23 MED ORDER — MORPHINE SULFATE 1 MG/ML IV SOLN PCA
INTRAVENOUS | Status: DC
Start: 2021-03-23 — End: 2021-03-27
  Administered 2021-03-23: 0 mg via INTRAVENOUS
  Administered 2021-03-23: 1 mg via INTRAVENOUS
  Administered 2021-03-24 (×3): 0 mg via INTRAVENOUS
  Administered 2021-03-24 (×3): 1 mg via INTRAVENOUS
  Administered 2021-03-25: 2 mg via INTRAVENOUS
  Administered 2021-03-25: 0 mg via INTRAVENOUS
  Administered 2021-03-25: 1 mg via INTRAVENOUS
  Administered 2021-03-25: 2 mg via INTRAVENOUS
  Administered 2021-03-25: 1 mg via INTRAVENOUS
  Administered 2021-03-26: 2 mg via INTRAVENOUS
  Administered 2021-03-26 – 2021-03-27 (×5): 1 mg via INTRAVENOUS
  Administered 2021-03-27: 0 mg via INTRAVENOUS
  Filled 2021-03-23: qty 30

## 2021-03-23 NOTE — Progress Notes (Signed)
[name] is a 60 year old female with a medical history significant for sickle cell disease, hypothyroidism, and colonic ulcer that was admitted this a.m. for symptomatic anemia in the setting of sickle cell pain crisis. Patient continues to have pain primarily to her chest and upper back.  Pain intensity is rated at 8/10.  Patient is opiate nave, she typically does not take opiates at home.  Primarily controls pain with over-the-counter Tylenol.  Patient denies any dizziness, shortness of breath, urinary symptoms, nausea, vomiting, or diarrhea.  Care plan: Hemoglobin is 5.5 g/dL posttransfusion.  We will transfuse 1 additional unit of PRBCs. Initiate IV morphine PCA at a reduced dose, patient opiate nave. Discontinue oral steroids   Repeat CBC with differential and BMP in AM.  Will reassess patient in AM.  Nolon Nations  APRN, MSN, FNP-C [facility] [address] [place], [place] [address] [phone]

## 2021-03-23 NOTE — H&P (Signed)
TRH H&P    Patient Demographics:    [name], is a 60 y.o. female  MRN: [id number]  DOB - March 16, 1961  Admit Date - 03/22/2021  Referring MD/NP/PA: Anitra Lauth  Outpatient Primary MD for the patient is Raymon Mutton., FNP  Patient coming from: Home  Chief complaint- "sickle cell pain"   HPI:    [name]  is a 60 y.o. female, with history of colonic ulcer, hypokalemia,, hypothyroidism, sickle cell anemia, and more presents the ED with a chief complaint of sickle cell pain.  Patient reports that she had chest pain and back pain that were alternating today.  Felt like a tightness in both the chest and the back.  She denies palpitations, and when asked about palpitation she said "it has nothing to do with my heart just my chest."  Patient denies shortness of breath.  She reports she did have abdominal pain starting today.  Was a crampy pain.  She has had a normal appetite.  She denies dyspnea, and fevers, as well as bleeding.  Patient reports her last blood transfusion was in June.  Is important to note that the hemoglobin that we have in our system are prior to transfusion and then patient is discharged home, so we do not have posttransfusion hemoglobins to compare.  Patient reports her baseline hemoglobin is 7.5.  Today her hemoglobin is 5.6.  She does report that she has had darkening urine over the last 1 month.  Patient is a little jaundiced, but reports that this is normal for her.  Daughter at bedside reports she looks her normal amount of jaundice.  Patient reports that she can usually take 2 Tylenol for her sickle pain and it goes away.  She did try 2 Tylenol today and she had no relief.  Patient presents the ER for sickle cell pain management.  In the ED Temp 97.9, heart rate 53-68, blood pressure 147/71, satting at 93% on 2 L nasal cannula Patient had drop down to 88% on room air and was placed on 2 L nasal  cannula, while getting the blood she was able to be weaned back to room air. Peripheral smear shows marked polychromasia, marked sickle cells, target cells present, reticulocyte count up to 370.5 No leukocytosis, hemoglobin 5.6 Chemistry panel shows no remarkable findings T bili is elevated at 4.3-which is about her baseline Chest x-ray shows no active cardiopulmonary disease EKG shows a heart rate of 66, sinus rhythm, QTC 503 Zofran and Reglan given in the ED Toradol and Dilaudid given in the ED -patient is opiate nave and that opiates made her very nauseous Admission requested for further management of sickle cell    Review of systems:    In addition to the HPI above,  No Fever-chills, No Headache, No changes with Vision or hearing, No problems swallowing food or Liquids, Admits to chest pain, no cough or Shortness of Breath, No Abdominal pain, No Nausea or Vomiting, bowel movements are regular, No Blood in stool or Urine, No dysuria, No new skin rashes or  bruises, Admits to back pain No new weakness, tingling, numbness in any extremity, No recent weight gain or loss, No polyuria, polydypsia or polyphagia, No significant Mental Stressors.  All other systems reviewed and are negative.    Past History of the following :    Past Medical History:  Diagnosis Date   Aneurysm (HCC)    Anxiety    Arthritis    Colon ulcer    Gall stones    Hypokalemia    Hypothyroidism    Pneumonia    Sickle cell anemia (HCC)    Ulcers of both lower extremities [employer])       Past Surgical History:  Procedure Laterality Date   ANKLE SURGERY  1989 and 1990   CEREBRAL ANEURYSM REPAIR     COLONOSCOPY  05/12/2012   Procedure: COLONOSCOPY;  Surgeon: Hart Carwin, MD;  Location: [facility];  Service: Endoscopy;  Laterality: N/A;   ESOPHAGOGASTRODUODENOSCOPY  05/12/2012   Procedure: ESOPHAGOGASTRODUODENOSCOPY (EGD);  Surgeon: Hart Carwin, MD;  Location: [facility];  Service: Endoscopy;   Laterality: N/A;   gall stone removal     GIVENS CAPSULE STUDY  05/13/2012   Procedure: GIVENS CAPSULE STUDY;  Surgeon: Hart Carwin, MD;  Location: [facility];  Service: Endoscopy;  Laterality: N/A;      Social History:      Social History   Tobacco Use   Smoking status: Never   Smokeless tobacco: Never  Substance Use Topics   Alcohol use: Not Currently    Comment: Occasional       Family History :     Family History  Problem Relation Age of Onset   Cancer Mother 99       Esophageal   Sickle cell trait Mother    Sickle cell trait Father    HIV/AIDS Brother    Colon cancer Neg Hx       Home Medications:   Prior to Admission medications   Medication Sig Start Date End Date Taking? Authorizing Provider  albuterol (VENTOLIN HFA) 108 (90 Base) MCG/ACT inhaler Inhale 1-2 puffs into the lungs every 6 (six) hours as needed for wheezing or shortness of breath. 02/29/20  Yes [provider]  ascorbic acid (VITAMIN C) 500 MG tablet Take 500 mg by mouth daily.   Yes [provider]  cyclobenzaprine (FLEXERIL) 10 MG tablet Take 10 mg by mouth 2 (two) times daily as needed for muscle spasms. 05/19/20  Yes [provider]  DULoxetine (CYMBALTA) 30 MG capsule Take 30 mg by mouth daily. 12/26/20  Yes [provider]  gabapentin (NEURONTIN) 300 MG capsule Take 300 mg by mouth daily.   Yes [provider]  levothyroxine (SYNTHROID) 175 MCG tablet Take 175 mcg by mouth daily before breakfast.   Yes [provider]  montelukast (SINGULAIR) 10 MG tablet Take 10 mg by mouth daily.   Yes [provider]  omega-3 acid ethyl esters (LOVAZA) 1 g capsule Take 1 g by mouth daily. 05/18/20  Yes [provider]  predniSONE (DELTASONE) 10 MG tablet Take 10-30 mg by mouth as directed. TAKE 3 TABLETS BY MOUTH DAILY FOR WEEK 1; THEN 2 TABLETS DAILY FOR WEEK 2, THEN 1 TABLET DAILY FOR WEEK 3 03/20/21  Yes [provider]   VITAMIN D PO Take 1 capsule by mouth daily.   Yes [provider]     Allergies:     Allergies  Allergen Reactions   Ampicillin Other (See Comments)  Caused red bumps on the skin, but nothing else Per patient, she tolerates cephalosporins without problems (??)   Demerol [Meperidine] Other (See Comments)    A high(er) dose caused seizure(s)     Physical Exam:   Vitals  Blood pressure 118/69, pulse (!) 54, temperature 97.6 F (36.4 C), temperature source Oral, resp. rate 12, SpO2 92 %.  1.  General: Patient lying supine in bed,  no acute distress   2. Psychiatric: Alert and oriented x 3, mood and behavior normal for situation, pleasant and cooperative with exam   3. Neurologic: Speech and language are normal, face is symmetric, moves all 4 extremities voluntarily, at baseline without acute deficits on limited exam   4. HEENMT:  Head is atraumatic, normocephalic, pupils reactive to light, scleral icterus, neck is supple, JVD, trachea is midline, mucous membranes are moist   5. Respiratory : Lungs are clear to auscultation bilaterally without wheezing, rhonchi, rales, no cyanosis, no increase in work of breathing or accessory muscle use   6. Cardiovascular : Heart rate normal, rhythm is regular, no murmurs, rubs or gallops, no peripheral edema, peripheral pulses palpated   7. Gastrointestinal:  Abdomen is soft, nondistended, nontender to palpation bowel sounds active, no masses or organomegaly palpated   8. Skin:  Skin is warm, dry and intact without rashes, acute lesions, or ulcers on limited exam   9.Musculoskeletal:  No acute deformities or trauma, no asymmetry in tone, no peripheral edema, peripheral pulses palpated, no tenderness to palpation in the extremities     Data Review:    CBC Recent Labs  Lab 03/22/21 1700  WBC 10.3  HGB 5.6*  HCT 15.9*  PLT 158  MCV 90.3  MCH 31.8  MCHC 35.2  RDW 24.6*  LYMPHSABS 1.5  MONOABS 0.5  EOSABS 0.0   BASOSABS 0.0   ------------------------------------------------------------------------------------------------------------------  Results for orders placed or performed during the hospital encounter of 03/22/21 (from the past 48 hour(s))  Comprehensive metabolic panel     Status: Abnormal   Collection Time: 03/22/21  5:00 PM  Result Value Ref Range   Sodium 141 135 - 145 mmol/L   Potassium 3.7 3.5 - 5.1 mmol/L   Chloride 109 98 - 111 mmol/L   CO2 24 22 - 32 mmol/L   Glucose, Bld 144 (H) 70 - 99 mg/dL    Comment: Glucose reference range applies only to samples taken after fasting for at least 8 hours.   BUN 8 6 - 20 mg/dL   Creatinine, Ser 3.71 0.44 - 1.00 mg/dL   Calcium 9.6 8.9 - 69.6 mg/dL   Total Protein 8.8 (H) 6.5 - 8.1 g/dL   Albumin 4.1 3.5 - 5.0 g/dL   AST 55 (H) 15 - 41 U/L   ALT 11 0 - 44 U/L   Alkaline Phosphatase 75 38 - 126 U/L   Total Bilirubin 4.3 (H) 0.3 - 1.2 mg/dL   GFR, Estimated >78 >93 mL/min    Comment: (NOTE) Calculated using the CKD-EPI Creatinine Equation (2021)    Anion gap 8 5 - 15    Comment: Performed at [facility], [address] [place], [place] [address]  CBC with Differential     Status: Abnormal   Collection Time: 03/22/21  5:00 PM  Result Value Ref Range   WBC 10.3 4.0 - 10.5 K/uL   RBC 1.76 (L) 3.87 - 5.11 MIL/uL   Hemoglobin 5.6 (LL) 12.0 - 15.0 g/dL    Comment: This critical result has  verified and been called to [facility] by Georgia Dom on 08 31 2022 at 1846, and has been read back. CRITICAL RESULT VERIFIED   HCT 15.9 (L) 36.0 - 46.0 %   MCV 90.3 80.0 - 100.0 fL   MCH 31.8 26.0 - 34.0 pg   MCHC 35.2 30.0 - 36.0 g/dL   RDW 40.9 (H) 81.1 - 91.4 %   Platelets 158 150 - 400 K/uL   nRBC 13.1 (H) 0.0 - 0.2 %   Neutrophils Relative % 77 %   Neutro Abs 7.9 (H) 1.7 - 7.7 K/uL   Lymphocytes Relative 15 %   Lymphs Abs 1.5 0.7 - 4.0 K/uL   Monocytes Relative 5 %   Monocytes Absolute 0.5 0.1 - 1.0 K/uL   Eosinophils  Relative 0 %   Eosinophils Absolute 0.0 0.0 - 0.5 K/uL   Basophils Relative 0 %   Basophils Absolute 0.0 0.0 - 0.1 K/uL   Immature Granulocytes 3 %   Abs Immature Granulocytes 0.29 (H) 0.00 - 0.07 K/uL   Polychromasia MARKED    Sickle Cells MARKED    Target Cells PRESENT     Comment: Performed at [facility], [address], [place], [place] [address]  Reticulocytes     Status: Abnormal   Collection Time: 03/22/21  5:00 PM  Result Value Ref Range   Retic Ct Pct 20.0 (H) 0.4 - 3.1 %    Comment: REPEATED TO VERIFY RESULTS CONFIRMED BY MANUAL DILUTION    RBC. 1.74 (L) 3.87 - 5.11 MIL/uL   Retic Count, Absolute 370.5 (H) 19.0 - 186.0 K/uL   Immature Retic Fract 36.4 (H) 2.3 - 15.9 %    Comment: Performed at [facility], [address] [place], [place] [address]  Resp Panel by RT-PCR (Flu A&B, Covid) Nasopharyngeal Swab     Status: None   Collection Time: 03/22/21  8:12 PM   Specimen: Nasopharyngeal Swab; Nasopharyngeal(NP) swabs in vial transport medium  Result Value Ref Range   SARS Coronavirus 2 by RT PCR NEGATIVE NEGATIVE    Comment: (NOTE) SARS-CoV-2 target nucleic acids are NOT DETECTED.  The SARS-CoV-2 RNA is generally detectable in upper respiratory specimens during the acute phase of infection. The lowest concentration of SARS-CoV-2 viral copies this assay can detect is 138 copies/mL. A negative result does not preclude SARS-Cov-2 infection and should not be used as the sole basis for treatment or other patient management decisions. A negative result may occur with  improper specimen collection/handling, submission of specimen other than nasopharyngeal swab, presence of viral mutation(s) within the areas targeted by this assay, and inadequate number of viral copies(<138 copies/mL). A negative result must be combined with clinical observations, patient history, and epidemiological information. The expected result is Negative.  Fact  Sheet for Patients:  BloggerCourse.com  Fact Sheet for Healthcare Providers:  SeriousBroker.it  This test is no t yet approved or cleared by the [place] FDA and  has been authorized for detection and/or diagnosis of SARS-CoV-2 by FDA under an Emergency Use Authorization (EUA). This EUA will remain  in effect (meaning this test can be used) for the duration of the COVID-19 declaration under Section 564(b)(1) of the Act, 21 U.S.C.section 360bbb-3(b)(1), unless the authorization is terminated  or revoked sooner.       Influenza A by PCR NEGATIVE NEGATIVE   Influenza B by PCR NEGATIVE NEGATIVE    Comment: (NOTE) The Xpert Xpress SARS-CoV-2/FLU/RSV plus assay is intended as an aid in  the diagnosis of influenza from Nasopharyngeal swab specimens and should not be used as a sole basis for treatment. Nasal washings and aspirates are unacceptable for Xpert Xpress SARS-CoV-2/FLU/RSV testing.  Fact Sheet for Patients: BloggerCourse.com  Fact Sheet for Healthcare Providers: SeriousBroker.it  This test is not yet approved or cleared by the [place] FDA and has been authorized for detection and/or diagnosis of SARS-CoV-2 by FDA under an Emergency Use Authorization (EUA). This EUA will remain in effect (meaning this test can be used) for the duration of the COVID-19 declaration under Section 564(b)(1) of the Act, 21 U.S.C. section 360bbb-3(b)(1), unless the authorization is terminated or revoked.  Performed at [facility], [address], [place], [place] [address]   Prepare RBC (crossmatch)     Status: None   Collection Time: 03/23/21 12:11 AM  Result Value Ref Range   Order Confirmation      ORDER PROCESSED BY BLOOD BANK Performed at [facility], [address], [place], [place] [address]   Type and screen Ordered by PROVIDER DEFAULT      Status: None (Preliminary result)   Collection Time: 03/23/21 12:55 AM  Result Value Ref Range   ABO/RH(D) B POS    Antibody Screen NEG    Sample Expiration 03/26/2021,2359    Unit Number [id number]    Blood Component Type RED CELLS,LR    Unit division 00    Status of Unit ISSUED    Transfusion Status OK TO TRANSFUSE    Crossmatch Result Compatible    Donor AG Type      NEGATIVE FOR E ANTIGEN NEGATIVE FOR C ANTIGEN NEGATIVE FOR KELL ANTIGEN Performed at [facility], [address] [place], [place] [address]     Chemistries  Recent Labs  Lab 03/22/21 1700  NA 141  K 3.7  CL 109  CO2 24  GLUCOSE 144*  BUN 8  CREATININE 0.63  CALCIUM 9.6  AST 55*  ALT 11  ALKPHOS 75  BILITOT 4.3*   ------------------------------------------------------------------------------------------------------------------  ------------------------------------------------------------------------------------------------------------------ GFR: CrCl cannot be calculated (Unknown ideal weight.). Liver Function Tests: Recent Labs  Lab 03/22/21 1700  AST 55*  ALT 11  ALKPHOS 75  BILITOT 4.3*  PROT 8.8*  ALBUMIN 4.1   No results for input(s): LIPASE, AMYLASE in the last 168 hours. No results for input(s): AMMONIA in the last 168 hours. Coagulation Profile: No results for input(s): INR, PROTIME in the last 168 hours. Cardiac Enzymes: No results for input(s): CKTOTAL, CKMB, CKMBINDEX, TROPONINI in the last 168 hours. BNP (last 3 results) No results for input(s): PROBNP in the last 8760 hours. HbA1C: No results for input(s): HGBA1C in the last 72 hours. CBG: No results for input(s): GLUCAP in the last 168 hours. Lipid Profile: No results for input(s): CHOL, HDL, LDLCALC, TRIG, CHOLHDL, LDLDIRECT in the last 72 hours. Thyroid Function Tests: No results for input(s): TSH, T4TOTAL, FREET4, T3FREE, THYROIDAB in the last 72 hours. Anemia Panel: Recent Labs     03/22/21 1700  RETICCTPCT 20.0*    --------------------------------------------------------------------------------------------------------------- Urine analysis:    Component Value Date/Time   COLORURINE YELLOW 02/04/2017 2010   APPEARANCEUR CLEAR 02/04/2017 2010   LABSPEC 1.015 09/24/2017 1451   PHURINE 7.0 09/24/2017 1451   GLUCOSEU NEGATIVE 09/24/2017 1451   HGBUR NEGATIVE 09/24/2017 1451   BILIRUBINUR small (A) 08/06/2018 1536   KETONESUR negative 08/06/2018 1536   KETONESUR NEGATIVE 09/24/2017 1451   PROTEINUR =100 (A) 08/06/2018 1536   PROTEINUR NEGATIVE 09/24/2017  1451   UROBILINOGEN 4.0 (A) 08/06/2018 1536   UROBILINOGEN 4.0 (H) 09/24/2017 1451   NITRITE Negative 08/06/2018 1536   NITRITE NEGATIVE 09/24/2017 1451   LEUKOCYTESUR Trace (A) 08/06/2018 1536      Imaging Results:    DG Chest Port 1 View  Result Date: 03/22/2021 CLINICAL DATA:  History of sickle cell disease with chest pain. EXAM: PORTABLE CHEST 1 VIEW COMPARISON:  January 07, 2020 FINDINGS: There is no evidence of acute infiltrate, pleural effusion or pneumothorax. The cardiac silhouette is mildly enlarged. The visualized skeletal structures are unremarkable. IMPRESSION: No active cardiopulmonary disease. Electronically Signed   By: Aram Candela M.D.   On: 03/22/2021 17:28    My personal review of EKG: Rhythm NSR, Rate 66/min, QTc 503 ,no Acute ST changes   Assessment & Plan:    Active Problems:   Thyroid disease   Sickle cell anemia (HCC)   Acute respiratory failure with hypoxia (HCC)   Sickle cell pain crisis With chest and back pain Very nauseous after Dilaudid Morphine to control pain Zofran to control nausea with Phenergan for refractory nausea Consult sickle cell team Start prednisone Since patient's baseline is 7.5 and she is down 2 g to 5.6 we will transfuse 1 unit and then defer to sickle cell tomorrow Patient does have elevated reticulocyte count as well Continue to monitor Acute  respiratory failure with hypoxia With O2 sats down to 88% Likely secondary to anemia, but opiates probably contributing as patient is opiate nave Patient was able to be weaned off of supplemental oxygen after transfusion and started Chest x-ray shows no acute cardiopulmonary disease Continue to monitor Thyroid disease Continue Synthroid Depression Continue duloxetine Tobacco abuse Declines nicotine patch at this time Counseled on cessation   DVT Prophylaxis-   Heparin- SCDs   AM Labs Ordered, also please review Full Orders  Family Communication: Admission, patients condition and plan of care including tests being ordered have been discussed with the patient and daughter who indicate understanding and agree with the plan and Code Status.  Code Status:  full  Admission status: Observation Time spent in minutes : 65   Keyonda Bickle B Zierle-Ghosh DO

## 2021-03-24 ENCOUNTER — Inpatient Hospital Stay (HOSPITAL_COMMUNITY): Payer: Medicare Other

## 2021-03-24 LAB — TYPE AND SCREEN
ABO/RH(D): B POS
Antibody Screen: NEGATIVE
Unit division: 0
Unit division: 0

## 2021-03-24 LAB — BPAM RBC
Blood Product Expiration Date: 202210062359
Blood Product Expiration Date: 202210062359
ISSUE DATE / TIME: 202209010248
ISSUE DATE / TIME: 202209011507
Unit Type and Rh: 5100
Unit Type and Rh: 9500

## 2021-03-24 LAB — CBC
HCT: 20.3 % — ABNORMAL LOW (ref 36.0–46.0)
Hemoglobin: 7.2 g/dL — ABNORMAL LOW (ref 12.0–15.0)
MCH: 31.2 pg (ref 26.0–34.0)
MCHC: 35.5 g/dL (ref 30.0–36.0)
MCV: 87.9 fL (ref 80.0–100.0)
Platelets: 137 10*3/uL — ABNORMAL LOW (ref 150–400)
RBC: 2.31 MIL/uL — ABNORMAL LOW (ref 3.87–5.11)
RDW: 21.5 % — ABNORMAL HIGH (ref 11.5–15.5)
WBC: 13.2 10*3/uL — ABNORMAL HIGH (ref 4.0–10.5)
nRBC: 41.1 % — ABNORMAL HIGH (ref 0.0–0.2)

## 2021-03-24 LAB — BASIC METABOLIC PANEL
Anion gap: 7 (ref 5–15)
BUN: 13 mg/dL (ref 6–20)
CO2: 25 mmol/L (ref 22–32)
Calcium: 9.2 mg/dL (ref 8.9–10.3)
Chloride: 105 mmol/L (ref 98–111)
Creatinine, Ser: 0.96 mg/dL (ref 0.44–1.00)
GFR, Estimated: >60 mL/min (ref >60)
Glucose, Bld: 84 mg/dL (ref 70–99)
Potassium: 4.2 mmol/L (ref 3.5–5.1)
Sodium: 137 mmol/L (ref 135–145)

## 2021-03-24 NOTE — Progress Notes (Cosign Needed)
Subjective: [name] is a 60 year old female with a medical history significant for sickle cell disease, hypothyroidism, history of depression, tobacco dependence, and colonic ulcer that was admitted for symptomatic anemia in the setting of sickle cell pain crisis. Today, patient's hemoglobin has improved to 7.2 g/dL, she is s/p 2 units PRBCs.  Today, patient is complaining of chest pain.  EKG shows bradycardia with normal sinus rhythm.  Patient continues to have significant pain primarily to central chest and upper back.  Pain is rated at 8/10.  She denies dizziness, headache, shortness of breath, nausea, vomiting, or diarrhea.  Objective:  Vital signs in last 24 hours:  Vitals:   03/25/21 0040 03/25/21 0430 03/25/21 0933 03/25/21 1011  BP: (!) 158/74 (!) 148/69 (!) 177/86   Pulse: (!) 48 (!) 53 62   Resp: 14 14 14 14   Temp:  97.8 F (36.6 C) 97.9 F (36.6 C)   TempSrc:  Oral Oral   SpO2: 99% 97% 98% 98%  Weight:      Height:        Intake/Output from previous day:   Intake/Output Summary (Last 24 hours) at 03/25/2021 1207 Last data filed at 03/25/2021 1015 Gross per 24 hour  Intake 1031.17 ml  Output --  Net 1031.17 ml    Physical Exam: General: Alert, awake, oriented x3, in no acute distress.  HEENT: [place]/AT PEERL, EOMI Neck: Trachea midline,  no masses, no thyromegal,y no JVD, no carotid bruit OROPHARYNX:  Moist, No exudate/ erythema/lesions.  Heart: Regular rate and rhythm, without murmurs, rubs, gallops, PMI non-displaced, no heaves or thrills on palpation.  Lungs: Clear to auscultation, no wheezing or rhonchi noted. No increased vocal fremitus resonant to percussion  Abdomen: Soft, nontender, nondistended, positive bowel sounds, no masses no hepatosplenomegaly noted..  Neuro: No focal neurological deficits noted cranial nerves II through XII grossly intact. DTRs 2+ bilaterally upper and lower extremities. Strength 5 out of 5 in bilateral upper and lower  extremities. Musculoskeletal: No warm swelling or erythema around joints, no spinal tenderness noted. Psychiatric: Patient alert and oriented x3, good insight and cognition, good recent to remote recall. Lymph node survey: No cervical axillary or inguinal lymphadenopathy noted.  Lab Results:  Basic Metabolic Panel:    Component Value Date/Time   NA 137 03/24/2021 0726   NA 137 11/18/2017 1608   K 4.2 03/24/2021 0726   CL 105 03/24/2021 0726   CO2 25 03/24/2021 0726   BUN 13 03/24/2021 0726   BUN 9 11/18/2017 1608   CREATININE 0.96 03/24/2021 0726   CREATININE 0.64 08/19/2020 1003   GLUCOSE 84 03/24/2021 0726   CALCIUM 9.2 03/24/2021 0726   CBC:    Component Value Date/Time   WBC 9.7 03/25/2021 0710   HGB 7.3 (L) 03/25/2021 0710   HGB 7.3 (L) 11/18/2017 1608   HCT 20.9 (L) 03/25/2021 0710   HCT 22.7 (L) 11/11/2019 0220   PLT 124 (L) 03/25/2021 0710   PLT 214 11/18/2017 1608   MCV 89.7 03/25/2021 0710   MCV 80 11/18/2017 1608   NEUTROABS 8.4 (H) 03/23/2021 0650   NEUTROABS 8.7 (H) 11/18/2017 1608   LYMPHSABS 3.4 03/23/2021 0650   LYMPHSABS 2.0 11/18/2017 1608   MONOABS 1.5 (H) 03/23/2021 0650   EOSABS 0.0 03/23/2021 0650   EOSABS 0.1 11/18/2017 1608   BASOSABS 0.0 03/23/2021 0650   BASOSABS 0.0 11/18/2017 1608    Recent Results (from the past 240 hour(s))  Resp Panel by RT-PCR (Flu A&B, Covid) Nasopharyngeal Swab  Status: None   Collection Time: 03/22/21  8:12 PM   Specimen: Nasopharyngeal Swab; Nasopharyngeal(NP) swabs in vial transport medium  Result Value Ref Range Status   SARS Coronavirus 2 by RT PCR NEGATIVE NEGATIVE Final    Comment: (NOTE) SARS-CoV-2 target nucleic acids are NOT DETECTED.  The SARS-CoV-2 RNA is generally detectable in upper respiratory specimens during the acute phase of infection. The lowest concentration of SARS-CoV-2 viral copies this assay can detect is 138 copies/mL. A negative result does not preclude SARS-Cov-2 infection and  should not be used as the sole basis for treatment or other patient management decisions. A negative result may occur with  improper specimen collection/handling, submission of specimen other than nasopharyngeal swab, presence of viral mutation(s) within the areas targeted by this assay, and inadequate number of viral copies(<138 copies/mL). A negative result must be combined with clinical observations, patient history, and epidemiological information. The expected result is Negative.  Fact Sheet for Patients:  BloggerCourse.com  Fact Sheet for Healthcare Providers:  SeriousBroker.it  This test is no t yet approved or cleared by the [place] FDA and  has been authorized for detection and/or diagnosis of SARS-CoV-2 by FDA under an Emergency Use Authorization (EUA). This EUA will remain  in effect (meaning this test can be used) for the duration of the COVID-19 declaration under Section 564(b)(1) of the Act, 21 U.S.C.section 360bbb-3(b)(1), unless the authorization is terminated  or revoked sooner.       Influenza A by PCR NEGATIVE NEGATIVE Final   Influenza B by PCR NEGATIVE NEGATIVE Final    Comment: (NOTE) The Xpert Xpress SARS-CoV-2/FLU/RSV plus assay is intended as an aid in the diagnosis of influenza from Nasopharyngeal swab specimens and should not be used as a sole basis for treatment. Nasal washings and aspirates are unacceptable for Xpert Xpress SARS-CoV-2/FLU/RSV testing.  Fact Sheet for Patients: BloggerCourse.com  Fact Sheet for Healthcare Providers: SeriousBroker.it  This test is not yet approved or cleared by the [place] FDA and has been authorized for detection and/or diagnosis of SARS-CoV-2 by FDA under an Emergency Use Authorization (EUA). This EUA will remain in effect (meaning this test can be used) for the duration of the COVID-19 declaration  under Section 564(b)(1) of the Act, 21 U.S.C. section 360bbb-3(b)(1), unless the authorization is terminated or revoked.  Performed at [facility], [address] [place], [place] [address]     Studies/Results: DG Chest 2 View  Result Date: 03/24/2021 CLINICAL DATA:  Generalized chest and upper back pain. Recent admission for sickle cell crisis. EXAM: CHEST - 2 VIEW COMPARISON:  Radiographs 03/22/2021 and 01/07/2020.  CT 01/29/2020. FINDINGS: Slightly lower lung volumes. There is stable cardiomegaly with increased vascular congestion and small bilateral pleural effusions. No confluent airspace opacity or pneumothorax. The bones appear unchanged. IMPRESSION: Cardiomegaly with increased vascular congestion and small bilateral pleural effusions. No consolidation identified. Electronically Signed   By: Carey Bullocks M.D.   On: 03/24/2021 13:54    Medications: Scheduled Meds:  DULoxetine  30 mg Oral Daily   famotidine  20 mg Oral Q12H   gabapentin  300 mg Oral Daily   heparin  5,000 Units Subcutaneous Q8H   ketorolac  15 mg Intravenous Q6H   levothyroxine  175 mcg Oral QAC breakfast   montelukast  10 mg Oral Daily   morphine   Intravenous Q4H   senna-docusate  1 tablet Oral BID   Continuous Infusions:  sodium chloride 10 mL/hr at 03/24/21 1135  diphenhydrAMINE     promethazine (PHENERGAN) injection (IM or IVPB) 12.5 mg (03/23/21 0800)   PRN Meds:.albuterol, diphenhydrAMINE **OR** diphenhydrAMINE, naloxone **AND** sodium chloride flush, ondansetron, polyethylene glycol, promethazine (PHENERGAN) injection (IM or IVPB), promethazine **OR** promethazine  Consultants: None  Procedures: None  Antibiotics: None  Assessment/Plan: Principal Problem:   Sickle cell crisis (HCC) Active Problems:   Thyroid disease   Anemia of chronic disease   Symptomatic anemia   Tobacco dependence  Symptomatic anemia: Patient is s/p 2 units PRBCs.  Hemoglobin has improved  and is consistent with patient's baseline.  No further blood transfusion warranted at this point.  Repeat CBC in a.m.  Sickle cell disease with pain crisis: Continue IV morphine PCA at a reduced dose, patient is opiate nave Oxycodone 5 mg every 4 hours as needed for severe breakthrough pain Toradol 15 mg IV every 6 hours for total of 5 days Monitor vital signs very closely, reevaluate pain scale regularly, and supplemental oxygen as needed  Hypothyroidism: Stable.  Continue home medication  History of depression: Stable.  Continue home medication.  Patient denies any suicidal or homicidal intent  Tobacco abuse: Patient counseled at length on the dangers of smoking, especially in the setting of sickle cell disease.  Patient continues to decline nicotine patches  Code Status: Full Code Family Communication: N/A Disposition Plan: Not yet ready for discharge  Nolon Nations  APRN, MSN, FNP-C [facility] [address] [place], [place] [address] [phone]  If 7PM-7AM, please contact night-coverage.  03/25/2021, 12:07 PM  LOS: 2 days

## 2021-03-25 DIAGNOSIS — E079 Disorder of thyroid, unspecified: Secondary | ICD-10-CM | POA: Diagnosis not present

## 2021-03-25 DIAGNOSIS — D649 Anemia, unspecified: Secondary | ICD-10-CM

## 2021-03-25 DIAGNOSIS — D638 Anemia in other chronic diseases classified elsewhere: Secondary | ICD-10-CM

## 2021-03-25 DIAGNOSIS — F17200 Nicotine dependence, unspecified, uncomplicated: Secondary | ICD-10-CM

## 2021-03-25 DIAGNOSIS — D5700 Hb-SS disease with crisis, unspecified: Principal | ICD-10-CM

## 2021-03-25 LAB — CBC
HCT: 20.9 % — ABNORMAL LOW (ref 36.0–46.0)
Hemoglobin: 7.3 g/dL — ABNORMAL LOW (ref 12.0–15.0)
MCH: 31.3 pg (ref 26.0–34.0)
MCHC: 34.9 g/dL (ref 30.0–36.0)
MCV: 89.7 fL (ref 80.0–100.0)
Platelets: 124 10*3/uL — ABNORMAL LOW (ref 150–400)
RBC: 2.33 MIL/uL — ABNORMAL LOW (ref 3.87–5.11)
RDW: 22.4 % — ABNORMAL HIGH (ref 11.5–15.5)
WBC: 9.7 10*3/uL (ref 4.0–10.5)
nRBC: 84.6 % — ABNORMAL HIGH (ref 0.0–0.2)

## 2021-03-25 MED ORDER — OXYCODONE HCL 5 MG PO TABS: 5.0000 mg | ORAL_TABLET | ORAL | Status: DC | PRN

## 2021-03-25 NOTE — Progress Notes (Signed)
Patient ID: [name], female   DOB: [birth year], 60 y.o.   MRN: [id number] Subjective: [name] is a 60 year old female with a medical history significant for sickle cell disease, hypothyroidism, and colonic ulcer that was admitted this a.m. for symptomatic anemia in the setting of sickle cell pain crisis.  Patient continues to have significant pain around her chest and upper back and will be left shoulder joints.  She rates her pain at 7/10 this morning.  Patient is opiate nave and IV morphine via PCA.  She denies any fever, cough, chest pain, dizziness, shortness of breath, nausea, vomiting or diarrhea.  No urinary symptoms.  Objective:  Vital signs in last 24 hours:  Vitals:   03/25/21 0430 03/25/21 0933 03/25/21 1011 03/25/21 1245  BP: (!) 148/69 (!) 177/86    Pulse: (!) 53 62    Resp: 14 14 14 14   Temp: 97.8 F (36.6 C) 97.9 F (36.6 C)    TempSrc: Oral Oral    SpO2: 97% 98% 98% 98%  Weight:      Height:        Intake/Output from previous day:   Intake/Output Summary (Last 24 hours) at 03/25/2021 1333 Last data filed at 03/25/2021 1015 Gross per 24 hour  Intake 791.17 ml  Output --  Net 791.17 ml    Physical Exam: General: Alert, awake, oriented x3, in no acute distress.  HEENT: [place]/AT PEERL, EOMI Neck: Trachea midline,  no masses, no thyromegal,y no JVD, no carotid bruit OROPHARYNX:  Moist, No exudate/ erythema/lesions.  Heart: Regular rate and rhythm, without murmurs, rubs, gallops, PMI non-displaced, no heaves or thrills on palpation.  Lungs: Clear to auscultation, no wheezing or rhonchi noted. No increased vocal fremitus resonant to percussion  Abdomen: Soft, nontender, nondistended, positive bowel sounds, no masses no hepatosplenomegaly noted..  Neuro: No focal neurological deficits noted cranial nerves II through XII grossly intact. DTRs 2+ bilaterally upper and lower extremities. Strength 5 out of 5 in bilateral upper and lower extremities. Musculoskeletal: No  warm swelling or erythema around joints, no spinal tenderness noted. Psychiatric: Patient alert and oriented x3, good insight and cognition, good recent to remote recall. Lymph node survey: No cervical axillary or inguinal lymphadenopathy noted.  Lab Results:  Basic Metabolic Panel:    Component Value Date/Time   NA 137 03/24/2021 0726   NA 137 11/18/2017 1608   K 4.2 03/24/2021 0726   CL 105 03/24/2021 0726   CO2 25 03/24/2021 0726   BUN 13 03/24/2021 0726   BUN 9 11/18/2017 1608   CREATININE 0.96 03/24/2021 0726   CREATININE 0.64 08/19/2020 1003   GLUCOSE 84 03/24/2021 0726   CALCIUM 9.2 03/24/2021 0726   CBC:    Component Value Date/Time   WBC 9.7 03/25/2021 0710   HGB 7.3 (L) 03/25/2021 0710   HGB 7.3 (L) 11/18/2017 1608   HCT 20.9 (L) 03/25/2021 0710   HCT 22.7 (L) 11/11/2019 0220   PLT 124 (L) 03/25/2021 0710   PLT 214 11/18/2017 1608   MCV 89.7 03/25/2021 0710   MCV 80 11/18/2017 1608   NEUTROABS 8.4 (H) 03/23/2021 0650   NEUTROABS 8.7 (H) 11/18/2017 1608   LYMPHSABS 3.4 03/23/2021 0650   LYMPHSABS 2.0 11/18/2017 1608   MONOABS 1.5 (H) 03/23/2021 0650   EOSABS 0.0 03/23/2021 0650   EOSABS 0.1 11/18/2017 1608   BASOSABS 0.0 03/23/2021 0650   BASOSABS 0.0 11/18/2017 1608    Recent Results (from the past 240 hour(s))  Resp Panel by RT-PCR (  Flu A&B, Covid) Nasopharyngeal Swab     Status: None   Collection Time: 03/22/21  8:12 PM   Specimen: Nasopharyngeal Swab; Nasopharyngeal(NP) swabs in vial transport medium  Result Value Ref Range Status   SARS Coronavirus 2 by RT PCR NEGATIVE NEGATIVE Final    Comment: (NOTE) SARS-CoV-2 target nucleic acids are NOT DETECTED.  The SARS-CoV-2 RNA is generally detectable in upper respiratory specimens during the acute phase of infection. The lowest concentration of SARS-CoV-2 viral copies this assay can detect is 138 copies/mL. A negative result does not preclude SARS-Cov-2 infection and should not be used as the sole  basis for treatment or other patient management decisions. A negative result may occur with  improper specimen collection/handling, submission of specimen other than nasopharyngeal swab, presence of viral mutation(s) within the areas targeted by this assay, and inadequate number of viral copies(<138 copies/mL). A negative result must be combined with clinical observations, patient history, and epidemiological information. The expected result is Negative.  Fact Sheet for Patients:  BloggerCourse.com  Fact Sheet for Healthcare Providers:  SeriousBroker.it  This test is no t yet approved or cleared by the [place] FDA and  has been authorized for detection and/or diagnosis of SARS-CoV-2 by FDA under an Emergency Use Authorization (EUA). This EUA will remain  in effect (meaning this test can be used) for the duration of the COVID-19 declaration under Section 564(b)(1) of the Act, 21 U.S.C.section 360bbb-3(b)(1), unless the authorization is terminated  or revoked sooner.       Influenza A by PCR NEGATIVE NEGATIVE Final   Influenza B by PCR NEGATIVE NEGATIVE Final    Comment: (NOTE) The Xpert Xpress SARS-CoV-2/FLU/RSV plus assay is intended as an aid in the diagnosis of influenza from Nasopharyngeal swab specimens and should not be used as a sole basis for treatment. Nasal washings and aspirates are unacceptable for Xpert Xpress SARS-CoV-2/FLU/RSV testing.  Fact Sheet for Patients: BloggerCourse.com  Fact Sheet for Healthcare Providers: SeriousBroker.it  This test is not yet approved or cleared by the [place] FDA and has been authorized for detection and/or diagnosis of SARS-CoV-2 by FDA under an Emergency Use Authorization (EUA). This EUA will remain in effect (meaning this test can be used) for the duration of the COVID-19 declaration under Section 564(b)(1) of the Act,  21 U.S.C. section 360bbb-3(b)(1), unless the authorization is terminated or revoked.  Performed at [facility], [address] [place], [place] [address]     Studies/Results: DG Chest 2 View  Result Date: 03/24/2021 CLINICAL DATA:  Generalized chest and upper back pain. Recent admission for sickle cell crisis. EXAM: CHEST - 2 VIEW COMPARISON:  Radiographs 03/22/2021 and 01/07/2020.  CT 01/29/2020. FINDINGS: Slightly lower lung volumes. There is stable cardiomegaly with increased vascular congestion and small bilateral pleural effusions. No confluent airspace opacity or pneumothorax. The bones appear unchanged. IMPRESSION: Cardiomegaly with increased vascular congestion and small bilateral pleural effusions. No consolidation identified. Electronically Signed   By: Carey Bullocks M.D.   On: 03/24/2021 13:54    Medications: Scheduled Meds:  DULoxetine  30 mg Oral Daily   famotidine  20 mg Oral Q12H   gabapentin  300 mg Oral Daily   heparin  5,000 Units Subcutaneous Q8H   ketorolac  15 mg Intravenous Q6H   levothyroxine  175 mcg Oral QAC breakfast   montelukast  10 mg Oral Daily   morphine   Intravenous Q4H   senna-docusate  1 tablet Oral BID   Continuous Infusions:  sodium chloride 10 mL/hr at 03/24/21 1135   diphenhydrAMINE     promethazine (PHENERGAN) injection (IM or IVPB) 12.5 mg (03/23/21 0800)   PRN Meds:.albuterol, diphenhydrAMINE **OR** diphenhydrAMINE, naloxone **AND** sodium chloride flush, ondansetron, oxyCODONE, polyethylene glycol, promethazine (PHENERGAN) injection (IM or IVPB), promethazine **OR** promethazine  Consultants: None  Procedures: None  Antibiotics: None  Assessment/Plan: Principal Problem:   Sickle cell crisis (HCC) Active Problems:   Thyroid disease   Anemia of chronic disease   Symptomatic anemia   Tobacco dependence  Hb Sickle Cell Disease with crisis: Reduce IVF to [facility], continue weight based morphine via PCA, continue  IV Toradol 15mg  Q 6 H, continue oral pain medications as ordered.  Monitor vitals very closely, Re-evaluate pain scale regularly, 2 L of Oxygen by [place]. Symptomatic anemia: Patient is status post transfusion of 2 units of packed red blood cell.  Hemoglobin improved to above 7.  Patient is hemodynamically stable at this time.  No further blood transfusion warranted at this moment. Chronic pain Syndrome: Patient is relatively opiate nave.  Not on chronic opiate therapy at home.  We will continue with low-dose morphine and oral oxycodone 5 mg every 4 hourly as needed for breakthrough pain. Hypothyroidism: Patient is clinically stable.  We will continue her home medications. History of major depressive disorder: Patient is stable clinically.  She denies any suicidal or homicidal ideation.  Continue home medications. Tobacco use disorder: [place] was counseled on the dangers of tobacco use, and was advised to quit. Reviewed strategies to maximize success, including removing cigarettes and smoking materials from environment, stress management and support of family/friends.   Code Status: Full Code Family Communication: N/A Disposition Plan: Not yet ready for discharge  [name]  If 7PM-7AM, please contact night-coverage.  03/25/2021, 1:33 PM  LOS: 2 days

## 2021-03-26 DIAGNOSIS — D5700 Hb-SS disease with crisis, unspecified: Secondary | ICD-10-CM | POA: Diagnosis not present

## 2021-03-26 DIAGNOSIS — D638 Anemia in other chronic diseases classified elsewhere: Secondary | ICD-10-CM | POA: Diagnosis not present

## 2021-03-26 DIAGNOSIS — D649 Anemia, unspecified: Secondary | ICD-10-CM | POA: Diagnosis not present

## 2021-03-26 DIAGNOSIS — E079 Disorder of thyroid, unspecified: Secondary | ICD-10-CM | POA: Diagnosis not present

## 2021-03-26 LAB — BASIC METABOLIC PANEL
Anion gap: 7 (ref 5–15)
BUN: 8 mg/dL (ref 6–20)
CO2: 27 mmol/L (ref 22–32)
Calcium: 8.9 mg/dL (ref 8.9–10.3)
Chloride: 98 mmol/L (ref 98–111)
Creatinine, Ser: 0.76 mg/dL (ref 0.44–1.00)
GFR, Estimated: >60 mL/min (ref >60)
Glucose, Bld: 87 mg/dL (ref 70–99)
Potassium: 3.6 mmol/L (ref 3.5–5.1)
Sodium: 132 mmol/L — ABNORMAL LOW (ref 135–145)

## 2021-03-26 LAB — TROPONIN I (HIGH SENSITIVITY)
Troponin I (High Sensitivity): 4 ng/L (ref <18)
Troponin I (High Sensitivity): 4 ng/L (ref <18)

## 2021-03-26 NOTE — Progress Notes (Signed)
Patient ID: [name], female   DOB: 29-Nov-1960, 60 y.o.   MRN: [id number] Subjective: [name] is a 60 year old female with a medical history significant for sickle cell disease, hypothyroidism, and colonic ulcer that was admitted this a.m. for symptomatic anemia in the setting of sickle cell pain crisis.  Patient is complaining of pain around her heart this morning, she was asking if this could be "heart attack".  She described the pain as sharp, intermittent, no significant radiation.  She has had left shoulder pain for a long time so not related today chest pain this morning.  She denies any other symptoms.  No diaphoresis.  No dizziness.  No fever.  No nausea, vomiting or diarrhea. Objective:  Vital signs in last 24 hours:  Vitals:   03/26/21 0815 03/26/21 1006 03/26/21 1214 03/26/21 1330  BP:  126/61  137/67  Pulse:  (!) 51  (!) 56  Resp: 14 13 20 13   Temp:  98 F (36.7 C)  98.9 F (37.2 C)  TempSrc:  Oral  Oral  SpO2: 98% 100% 99% 100%  Weight:      Height:        Intake/Output from previous day:   Intake/Output Summary (Last 24 hours) at 03/26/2021 1455 Last data filed at 03/26/2021 05/26/2021 Gross per 24 hour  Intake 1218.83 ml  Output 300 ml  Net 918.83 ml     Physical Exam: General: Alert, awake, oriented x3, in no acute distress.  HEENT: [place]/AT PEERL, EOMI Neck: Trachea midline,  no masses, no thyromegal,y no JVD, no carotid bruit OROPHARYNX:  Moist, No exudate/ erythema/lesions.  Heart: Regular rate and rhythm, without murmurs, rubs, gallops, PMI non-displaced, no heaves or thrills on palpation.  Lungs: Clear to auscultation, no wheezing or rhonchi noted. No increased vocal fremitus resonant to percussion  Abdomen: Soft, nontender, nondistended, positive bowel sounds, no masses no hepatosplenomegaly noted..  Neuro: No focal neurological deficits noted cranial nerves II through XII grossly intact. DTRs 2+ bilaterally upper and lower extremities. Strength 5 out of 5 in  bilateral upper and lower extremities. Musculoskeletal: No warm swelling or erythema around joints, no spinal tenderness noted. Psychiatric: Patient alert and oriented x3, good insight and cognition, good recent to remote recall. Lymph node survey: No cervical axillary or inguinal lymphadenopathy noted.  Lab Results:  Basic Metabolic Panel:    Component Value Date/Time   NA 132 (L) 03/26/2021 0623   NA 137 11/18/2017 1608   K 3.6 03/26/2021 0623   CL 98 03/26/2021 0623   CO2 27 03/26/2021 0623   BUN 8 03/26/2021 0623   BUN 9 11/18/2017 1608   CREATININE 0.76 03/26/2021 0623   CREATININE 0.64 08/19/2020 1003   GLUCOSE 87 03/26/2021 0623   CALCIUM 8.9 03/26/2021 0623   CBC:    Component Value Date/Time   WBC 9.7 03/25/2021 0710   HGB 7.3 (L) 03/25/2021 0710   HGB 7.3 (L) 11/18/2017 1608   HCT 20.9 (L) 03/25/2021 0710   HCT 22.7 (L) 11/11/2019 0220   PLT 124 (L) 03/25/2021 0710   PLT 214 11/18/2017 1608   MCV 89.7 03/25/2021 0710   MCV 80 11/18/2017 1608   NEUTROABS 8.4 (H) 03/23/2021 0650   NEUTROABS 8.7 (H) 11/18/2017 1608   LYMPHSABS 3.4 03/23/2021 0650   LYMPHSABS 2.0 11/18/2017 1608   MONOABS 1.5 (H) 03/23/2021 0650   EOSABS 0.0 03/23/2021 0650   EOSABS 0.1 11/18/2017 1608   BASOSABS 0.0 03/23/2021 0650   BASOSABS 0.0 11/18/2017 1608  Recent Results (from the past 240 hour(s))  Resp Panel by RT-PCR (Flu A&B, Covid) Nasopharyngeal Swab     Status: None   Collection Time: 03/22/21  8:12 PM   Specimen: Nasopharyngeal Swab; Nasopharyngeal(NP) swabs in vial transport medium  Result Value Ref Range Status   SARS Coronavirus 2 by RT PCR NEGATIVE NEGATIVE Final    Comment: (NOTE) SARS-CoV-2 target nucleic acids are NOT DETECTED.  The SARS-CoV-2 RNA is generally detectable in upper respiratory specimens during the acute phase of infection. The lowest concentration of SARS-CoV-2 viral copies this assay can detect is 138 copies/mL. A negative result does not preclude  SARS-Cov-2 infection and should not be used as the sole basis for treatment or other patient management decisions. A negative result may occur with  improper specimen collection/handling, submission of specimen other than nasopharyngeal swab, presence of viral mutation(s) within the areas targeted by this assay, and inadequate number of viral copies(<138 copies/mL). A negative result must be combined with clinical observations, patient history, and epidemiological information. The expected result is Negative.  Fact Sheet for Patients:  BloggerCourse.com  Fact Sheet for Healthcare Providers:  SeriousBroker.it  This test is no t yet approved or cleared by the [place] FDA and  has been authorized for detection and/or diagnosis of SARS-CoV-2 by FDA under an Emergency Use Authorization (EUA). This EUA will remain  in effect (meaning this test can be used) for the duration of the COVID-19 declaration under Section 564(b)(1) of the Act, 21 U.S.C.section 360bbb-3(b)(1), unless the authorization is terminated  or revoked sooner.       Influenza A by PCR NEGATIVE NEGATIVE Final   Influenza B by PCR NEGATIVE NEGATIVE Final    Comment: (NOTE) The Xpert Xpress SARS-CoV-2/FLU/RSV plus assay is intended as an aid in the diagnosis of influenza from Nasopharyngeal swab specimens and should not be used as a sole basis for treatment. Nasal washings and aspirates are unacceptable for Xpert Xpress SARS-CoV-2/FLU/RSV testing.  Fact Sheet for Patients: BloggerCourse.com  Fact Sheet for Healthcare Providers: SeriousBroker.it  This test is not yet approved or cleared by the [place] FDA and has been authorized for detection and/or diagnosis of SARS-CoV-2 by FDA under an Emergency Use Authorization (EUA). This EUA will remain in effect (meaning this test can be used) for the duration of  the COVID-19 declaration under Section 564(b)(1) of the Act, 21 U.S.C. section 360bbb-3(b)(1), unless the authorization is terminated or revoked.  Performed at [facility], [address], [place], [place] [address]     Studies/Results: No results found.  Medications: Scheduled Meds:  DULoxetine  30 mg Oral Daily   famotidine  20 mg Oral Q12H   gabapentin  300 mg Oral Daily   heparin  5,000 Units Subcutaneous Q8H   ketorolac  15 mg Intravenous Q6H   levothyroxine  175 mcg Oral QAC breakfast   montelukast  10 mg Oral Daily   morphine   Intravenous Q4H   senna-docusate  1 tablet Oral BID   Continuous Infusions:  sodium chloride 10 mL/hr at 03/24/21 1135   diphenhydrAMINE     promethazine (PHENERGAN) injection (IM or IVPB) 12.5 mg (03/23/21 0800)   PRN Meds:.albuterol, diphenhydrAMINE **OR** diphenhydrAMINE, naloxone **AND** sodium chloride flush, ondansetron, oxyCODONE, polyethylene glycol, promethazine (PHENERGAN) injection (IM or IVPB), promethazine **OR** promethazine  Consultants: None  Procedures: None  Antibiotics: None  Assessment/Plan: Principal Problem:   Sickle cell crisis (HCC) Active Problems:   Thyroid disease   Anemia of chronic  disease   Anemia   Sickle cell pain crisis (HCC)   Tobacco dependence  Hb Sickle Cell Disease with crisis: Reduce IVF to [facility], continue weight based morphine via PCA, continue IV Toradol 15mg  Q 6 H, continue oral pain medications as ordered.  Monitor vitals very closely, Re-evaluate pain scale regularly, 2 L of Oxygen by [place].  Atypical chest pain: Order troponin cycle, and EKG. patient counseled.  Further management will depend on results of EKG and ACS work-up. Symptomatic anemia: Patient is status post transfusion of 2 units of packed red blood cell.  Hemoglobin improved to 7.3.  Patient is hemodynamically stable at this time. No further blood transfusion warranted at this moment. Chronic pain Syndrome:  Patient is relatively opiate nave. Not on chronic opiate therapy at home. We will continue with low-dose morphine PCA and oral oxycodone 5 mg every 4 hourly as needed for breakthrough pain. Hypothyroidism: Patient is clinically stable.  We will continue her home medications. History of major depressive disorder: Patient is stable clinically. She denies any suicidal or homicidal ideation.  Continue home medications. Tobacco use disorder: Kona was counseled on the dangers of tobacco use, and was advised to quit. Reviewed strategies to maximize success, including removing cigarettes and smoking materials from environment, stress management and support of family/friends.   Code Status: Full Code Family Communication: N/A Disposition Plan: Not yet ready for discharge  Takia Runyon  If 7PM-7AM, please contact night-coverage.  03/26/2021, 2:55 PM  LOS: 3 days

## 2021-03-26 NOTE — Plan of Care (Signed)

## 2021-03-27 ENCOUNTER — Other Ambulatory Visit: Payer: Self-pay | Admitting: Family Medicine

## 2021-03-27 DIAGNOSIS — D5700 Hb-SS disease with crisis, unspecified: Secondary | ICD-10-CM | POA: Diagnosis not present

## 2021-03-27 LAB — CBC WITH DIFFERENTIAL/PLATELET
Abs Immature Granulocytes: 0.04 10*3/uL (ref 0.00–0.07)
Basophils Absolute: 0 10*3/uL (ref 0.0–0.1)
Basophils Relative: 0 %
Eosinophils Absolute: 0.6 10*3/uL — ABNORMAL HIGH (ref 0.0–0.5)
Eosinophils Relative: 9 %
HCT: 21.2 % — ABNORMAL LOW (ref 36.0–46.0)
Hemoglobin: 7.3 g/dL — ABNORMAL LOW (ref 12.0–15.0)
Immature Granulocytes: 1 %
Lymphocytes Relative: 35 %
Lymphs Abs: 2.5 10*3/uL (ref 0.7–4.0)
MCH: 31.1 pg (ref 26.0–34.0)
MCHC: 34.4 g/dL (ref 30.0–36.0)
MCV: 90.2 fL (ref 80.0–100.0)
Monocytes Absolute: 1 10*3/uL (ref 0.1–1.0)
Monocytes Relative: 14 %
Neutro Abs: 2.9 10*3/uL (ref 1.7–7.7)
Neutrophils Relative %: 41 %
Platelets: 99 10*3/uL — ABNORMAL LOW (ref 150–400)
RBC: 2.35 MIL/uL — ABNORMAL LOW (ref 3.87–5.11)
RDW: 20.8 % — ABNORMAL HIGH (ref 11.5–15.5)
WBC: 7.1 10*3/uL (ref 4.0–10.5)
nRBC: 47.1 % — ABNORMAL HIGH (ref 0.0–0.2)

## 2021-03-27 MED ORDER — OXYCODONE HCL 5 MG PO TABS: 5.0000 mg | ORAL_TABLET | ORAL | 0 refills | Status: AC | PRN

## 2021-03-27 MED ORDER — IBUPROFEN 800 MG PO TABS: 800.0000 mg | ORAL_TABLET | Freq: Three times a day (TID) | ORAL | 0 refills | Status: DC | PRN

## 2021-03-27 NOTE — Discharge Summary (Signed)
Physician Discharge Summary  [name] VPX:[id number] DOB: [birth year] DOA: 03/22/2021  PCP: Raymon Mutton., FNP  Admit date: 03/22/2021  Discharge date: 03/27/2021  Discharge Diagnoses:  Principal Problem:   Sickle cell crisis (HCC) Active Problems:   Thyroid disease   Anemia of chronic disease   Anemia   Sickle cell pain crisis (HCC)   Tobacco dependence   Discharge Condition: Stable  Disposition:   Follow-up Information     Raymon Mutton., FNP Follow up.   Specialty: Family Medicine Contact information: [address] [place] [address] [phone]         Massie Maroon, FNP Follow up.   Specialty: Family Medicine Why: Friday, September 9th, will CBC w/differential. Contact information: [address] [place] [address] [phone]                Pt is discharged home in good condition and is to follow up with Raymon Mutton., FNP this week to have labs evaluated. Jasa Dundon is instructed to increase activity slowly and balance with rest for the next few days, and use prescribed medication to complete treatment of pain  Diet: Regular Wt Readings from Last 3 Encounters:  03/27/21 72.5 kg  08/19/20 70.3 kg  04/28/20 68.5 kg    History of present illness:  [name] is a 60 year old female with a history of colonic ulcer, hypokalemia, hypothyroidism, sickle cell anemia, and morbid presents to the ED with a chief complaint of sickle cell pain.  Patient reports that she had chest pain and back pain that were alternating today.  Felt like a tightness in both her chest and the back.  She denies palpitations, and when asked about palpitations she said "it is nothing to do with my heart, just my chest".  Patient denies shortness of breath.  She reports she did have abdominal pain starting today.  Was a crampy pain.  She has some normal appetite.  She denies dyspnea and fevers, as well as bleeding.  Patient reports that her last  blood transfusion was back in June.  It is important to note that the hemoglobin that we have in our systems are prior to transfusion and then patient was discharged home, so we do not have a posttransfusion hemoglobin to compare.  Patient reports her baseline hemoglobin is 7.5.  Today her hemoglobin is 5.6.  She does not report that she has had darkening urine over the last month.  Patient is a little jaundice, but reports that this is normal for her.  Daughter at bedside reports she looks her normal amount of jaundice.  Patient reports that she can usually take 2 Tylenol for sickle cell pain and it goes away.  She did try 2 Tylenol earlier today without relief.  Patient presents to the ER for sickle cell pain management.  In the ED: Temperature 97.9, heart rate 56-68, blood pressure 147/71, satting 93% on 2 L nasal cannula.  Patient had a drop down to 88% on RA and was placed on 2 L nasal cannula, while getting the blood she was able to be weaned back to room air.  Peripheral smear shows some polychromasia, marked sickle cells, target cells present.  Reticulocyte count up to 370.5 No leukocytosis, hemoglobin 5.6 g/dL.  Chemistry panel shows no remarkable findings.  T bili is elevated at 4.3, which is consistent with her baseline.  Chest x-ray shows no acute cardiopulmonary process.  Chemistry panel unremarkable.  EKG shows  heart rate of 66, sinus rhythm, QTC 503.  Zofran and Reglan given in the ED.  Toradol and Dilaudid given in the ED.  Patient is opiate nave and states that opiates make her very nauseous.  Admission requested for further management of sickle cell disease.  Hospital Course:  Symptomatic anemia in the setting of sickle cell pain crisis: On admission, patient's hemoglobin was 5.5 g/dL, which is below her baseline of 7-8 g/dL.  Patient was transfused a total of 2 units PRBCs without complication.  Prior to discharge, hemoglobin improved to 7.2 g/dL.  Patient will follow-up with [facility] care center on 03/31/2021 to repeat CBC with differential and CMP.  If hemoglobin is less than 7 g/dL at that time, will transfuse an additional 1 unit of PRBCs.  Sickle cell disease with pain crisis: Complaint of pain primarily to central chest and back, which is consistent with her previous sickle cell pain crisis.  Patient is very opiate nave and typically takes Tylenol at home for pain control.  Patient's pain intensity has decreased to 5/10.  She feels that she can manage at home as long as she has some pain medication sent in.  Oxycodone 5 mg every 4 hours as needed #42 was sent to patient's pharmacy.  Reviewed PDMP substance reporting system prior to prescribing opiate medications, no inconsistencies noted.  Also, ibuprofen 800 mg every 8 hours was sent in as needed.  Patient advised to take ibuprofen with food.  She expressed understanding of medication regimen.  Patient will be discharged home with oxycodone 5 mg every 4 hours as needed, ibuprofen 800 mg every 8 hours as needed with food, resume Tylenol as directed.  Also, resume all home medications and follow-up with her PCP for medication management  [name] is alert, oriented, and ambulating without assistance.  Patient was therefore discharged home today in a hemodynamically stable condition.   [name] will follow-up with PCP within 1 week of this discharge. [name] was counseled extensively about nonpharmacologic means of pain management, patient verbalized understanding and was appreciative of  the care received during this admission.   We discussed the need for good hydration, monitoring of hydration status, avoidance of heat, cold, stress, and infection triggers. We discussed the need to be adherent with taking home medications.  Patient is not on any disease modifying agents at this time.  Recommend starting folic acid 1 mg daily.  Also, patient warrants referral to a hematologist for further management of her sickle cell disease.   She may benefit from [place] going forward.  Patient was reminded of the need to seek medical attention immediately if any symptom of bleeding, anemia, or infection occurs.  Discharge Exam: Vitals:   03/27/21 0450 03/27/21 0905  BP:    Pulse:    Resp: 14 15  Temp:    SpO2: 100% 99%   Vitals:   03/27/21 0434 03/27/21 0440 03/27/21 0450 03/27/21 0905  BP: (!) 153/77     Pulse: (!) 56     Resp: 17  14 15   Temp: 98 F (36.7 C)     TempSrc: Oral     SpO2: 100%  100% 99%  Weight:  72.5 kg    Height:        General appearance : Awake, alert, not in any distress. Speech Clear. Not toxic looking HEENT: Atraumatic and Normocephalic, pupils equally reactive to light and accomodation Neck: Supple, no JVD. No cervical lymphadenopathy.  Chest: Good air entry  bilaterally, no added sounds  CVS: S1 S2 regular, no murmurs.  Abdomen: Bowel sounds present, Non tender and not distended with no gaurding, rigidity or rebound. Extremities: B/L Lower Ext shows no edema, both legs are warm to touch Neurology: Awake alert, and oriented X 3, CN II-XII intact, Non focal Skin: No Rash  Discharge Instructions  Discharge Instructions     Discharge patient   Complete by: As directed    Discharge disposition: 01-Home or Self Care   Discharge patient date: 03/27/2021      Allergies as of 03/27/2021       Reactions   Ampicillin Other (See Comments)   Caused red bumps on the skin, but nothing else Per patient, she tolerates cephalosporins without problems (??)   Demerol [meperidine] Other (See Comments)   A high(er) dose caused seizure(s)        Medication List     STOP taking these medications    predniSONE 10 MG tablet Commonly known as: DELTASONE       TAKE these medications    albuterol 108 (90 Base) MCG/ACT inhaler Commonly known as: VENTOLIN HFA Inhale 1-2 puffs into the lungs every 6 (six) hours as needed for wheezing or shortness of breath.   ascorbic acid 500 MG  tablet Commonly known as: VITAMIN C Take 500 mg by mouth daily.   cyclobenzaprine 10 MG tablet Commonly known as: FLEXERIL Take 10 mg by mouth 2 (two) times daily as needed for muscle spasms.   DULoxetine 30 MG capsule Commonly known as: CYMBALTA Take 30 mg by mouth daily.   gabapentin 300 MG capsule Commonly known as: NEURONTIN Take 300 mg by mouth daily.   ibuprofen 800 MG tablet Commonly known as: ADVIL Take 1 tablet (800 mg total) by mouth every 8 (eight) hours as needed.   levothyroxine 175 MCG tablet Commonly known as: SYNTHROID Take 175 mcg by mouth daily before breakfast.   montelukast 10 MG tablet Commonly known as: SINGULAIR Take 10 mg by mouth daily.   omega-3 acid ethyl esters 1 g capsule Commonly known as: LOVAZA Take 1 g by mouth daily.   oxyCODONE 5 MG immediate release tablet Commonly known as: Oxy IR/ROXICODONE Take 1 tablet (5 mg total) by mouth every 4 (four) hours as needed for up to 7 days for breakthrough pain or severe pain.   VITAMIN D PO Take 1 capsule by mouth daily.        The results of significant diagnostics from this hospitalization (including imaging, microbiology, ancillary and laboratory) are listed below for reference.    Significant Diagnostic Studies: DG Chest 2 View  Result Date: 03/24/2021 CLINICAL DATA:  Generalized chest and upper back pain. Recent admission for sickle cell crisis. EXAM: CHEST - 2 VIEW COMPARISON:  Radiographs 03/22/2021 and 01/07/2020.  CT 01/29/2020. FINDINGS: Slightly lower lung volumes. There is stable cardiomegaly with increased vascular congestion and small bilateral pleural effusions. No confluent airspace opacity or pneumothorax. The bones appear unchanged. IMPRESSION: Cardiomegaly with increased vascular congestion and small bilateral pleural effusions. No consolidation identified. Electronically Signed   By: Carey Bullocks M.D.   On: 03/24/2021 13:54   DG Chest Port 1 View  Result Date:  03/22/2021 CLINICAL DATA:  History of sickle cell disease with chest pain. EXAM: PORTABLE CHEST 1 VIEW COMPARISON:  January 07, 2020 FINDINGS: There is no evidence of acute infiltrate, pleural effusion or pneumothorax. The cardiac silhouette is mildly enlarged. The visualized skeletal structures are unremarkable. IMPRESSION: No active cardiopulmonary disease.  Electronically Signed   By: Aram Candela M.D.   On: 03/22/2021 17:28    Microbiology: Recent Results (from the past 240 hour(s))  Resp Panel by RT-PCR (Flu A&B, Covid) Nasopharyngeal Swab     Status: None   Collection Time: 03/22/21  8:12 PM   Specimen: Nasopharyngeal Swab; Nasopharyngeal(NP) swabs in vial transport medium  Result Value Ref Range Status   SARS Coronavirus 2 by RT PCR NEGATIVE NEGATIVE Final    Comment: (NOTE) SARS-CoV-2 target nucleic acids are NOT DETECTED.  The SARS-CoV-2 RNA is generally detectable in upper respiratory specimens during the acute phase of infection. The lowest concentration of SARS-CoV-2 viral copies this assay can detect is 138 copies/mL. A negative result does not preclude SARS-Cov-2 infection and should not be used as the sole basis for treatment or other patient management decisions. A negative result may occur with  improper specimen collection/handling, submission of specimen other than nasopharyngeal swab, presence of viral mutation(s) within the areas targeted by this assay, and inadequate number of viral copies(<138 copies/mL). A negative result must be combined with clinical observations, patient history, and epidemiological information. The expected result is Negative.  Fact Sheet for Patients:  BloggerCourse.com  Fact Sheet for Healthcare Providers:  SeriousBroker.it  This test is no t yet approved or cleared by the [place] FDA and  has been authorized for detection and/or diagnosis of SARS-CoV-2 by FDA under an Emergency Use  Authorization (EUA). This EUA will remain  in effect (meaning this test can be used) for the duration of the COVID-19 declaration under Section 564(b)(1) of the Act, 21 U.S.C.section 360bbb-3(b)(1), unless the authorization is terminated  or revoked sooner.       Influenza A by PCR NEGATIVE NEGATIVE Final   Influenza B by PCR NEGATIVE NEGATIVE Final    Comment: (NOTE) The Xpert Xpress SARS-CoV-2/FLU/RSV plus assay is intended as an aid in the diagnosis of influenza from Nasopharyngeal swab specimens and should not be used as a sole basis for treatment. Nasal washings and aspirates are unacceptable for Xpert Xpress SARS-CoV-2/FLU/RSV testing.  Fact Sheet for Patients: BloggerCourse.com  Fact Sheet for Healthcare Providers: SeriousBroker.it  This test is not yet approved or cleared by the [place] FDA and has been authorized for detection and/or diagnosis of SARS-CoV-2 by FDA under an Emergency Use Authorization (EUA). This EUA will remain in effect (meaning this test can be used) for the duration of the COVID-19 declaration under Section 564(b)(1) of the Act, 21 U.S.C. section 360bbb-3(b)(1), unless the authorization is terminated or revoked.  Performed at [facility], [address] [place], [place] [address]      Labs: Basic Metabolic Panel: Recent Labs  Lab 03/22/21 1700 03/23/21 0650 03/24/21 0726 03/26/21 0623  NA 141 142 137 132*  K 3.7 3.6 4.2 3.6  CL 109 109 105 98  CO2 GLUCOSE 144* 78 84 87  BUN CREATININE 0.63 0.79 0.96 0.76  CALCIUM 9.6 9.3 9.2 8.9  MG  --  2.2  --   --    Liver Function Tests: Recent Labs  Lab 03/22/21 1700 03/23/21 0650  AST 55* 46*  ALT 11 11  ALKPHOS 75 54  BILITOT 4.3* 4.4*  PROT 8.8* 8.0  ALBUMIN 4.1 3.8   No results for input(s): LIPASE, AMYLASE in the last 168 hours. No results for input(s): AMMONIA in the last 168  hours. CBC: Recent Labs  Lab 03/22/21 1700 03/23/21  1610 03/23/21 1253 03/23/21 2009 03/24/21 0726 03/25/21 0710 03/27/21 0502  WBC 10.3 13.7*  --  11.7* 13.2* 9.7 7.1  NEUTROABS 7.9* 8.4*  --   --   --   --  2.9  HGB 5.6* 5.4* 5.5* 6.7* 7.2* 7.3* 7.3*  HCT 15.9* 15.6* 15.4* 18.8* 20.3* 20.9* 21.2*  MCV 90.3 89.1  --  87.4 87.9 89.7 90.2  PLT 158 112*  --  118* 137* 124* 99*   Cardiac Enzymes: No results for input(s): CKTOTAL, CKMB, CKMBINDEX, TROPONINI in the last 168 hours. BNP: Invalid input(s): POCBNP CBG: No results for input(s): GLUCAP in the last 168 hours.  Time coordinating discharge: 30 minutes  Signed:  Nolon Nations  APRN, MSN, FNP-C Patient Care [facility] [address] [place], [place] [address] [phone]  [facility] 03/27/2021, 9:47 AM

## 2021-03-27 NOTE — Care Management Important Message (Signed)
Important Message  Patient Details IM Letter given to the Patient. Name: [name] MRN: [id number] Date of Birth: 1961-04-24   Medicare Important Message Given:  Yes     [name] 03/27/2021, 11:31 AM

## 2021-03-31 ENCOUNTER — Other Ambulatory Visit: Payer: Self-pay | Admitting: Family Medicine

## 2021-03-31 ENCOUNTER — Other Ambulatory Visit: Payer: Self-pay

## 2021-03-31 ENCOUNTER — Non-Acute Institutional Stay (HOSPITAL_COMMUNITY)
Admission: RE | Admit: 2021-03-31 | Discharge: 2021-03-31 | Disposition: A | Discharge status: Home / Self Care | Payer: Medicare Other | Source: Ambulatory Visit | Attending: Internal Medicine | Admitting: Internal Medicine

## 2021-03-31 DIAGNOSIS — D5700 Hb-SS disease with crisis, unspecified: Secondary | ICD-10-CM | POA: Insufficient documentation

## 2021-03-31 LAB — COMPREHENSIVE METABOLIC PANEL
ALT: 18 U/L (ref 0–44)
AST: 71 U/L — ABNORMAL HIGH (ref 15–41)
Albumin: 4.1 g/dL (ref 3.5–5.0)
Alkaline Phosphatase: 69 U/L (ref 38–126)
Anion gap: 6 (ref 5–15)
BUN: 7 mg/dL (ref 6–20)
CO2: 25 mmol/L (ref 22–32)
Calcium: 9.2 mg/dL (ref 8.9–10.3)
Chloride: 105 mmol/L (ref 98–111)
Creatinine, Ser: 0.78 mg/dL (ref 0.44–1.00)
GFR, Estimated: >60 mL/min (ref >60)
Glucose, Bld: 102 mg/dL — ABNORMAL HIGH (ref 70–99)
Potassium: 3.7 mmol/L (ref 3.5–5.1)
Sodium: 136 mmol/L (ref 135–145)
Total Bilirubin: 3.3 mg/dL — ABNORMAL HIGH (ref 0.3–1.2)
Total Protein: 8.2 g/dL — ABNORMAL HIGH (ref 6.5–8.1)

## 2021-03-31 LAB — CBC WITH DIFFERENTIAL/PLATELET
Abs Immature Granulocytes: 0.04 10*3/uL (ref 0.00–0.07)
Basophils Absolute: 0.1 10*3/uL (ref 0.0–0.1)
Basophils Relative: 1 %
Eosinophils Absolute: 0.4 10*3/uL (ref 0.0–0.5)
Eosinophils Relative: 5 %
HCT: 18.8 % — ABNORMAL LOW (ref 36.0–46.0)
Hemoglobin: 6.5 g/dL — CL (ref 12.0–15.0)
Immature Granulocytes: 1 %
Lymphocytes Relative: 34 %
Lymphs Abs: 2.3 10*3/uL (ref 0.7–4.0)
MCH: 30.4 pg (ref 26.0–34.0)
MCHC: 34.6 g/dL (ref 30.0–36.0)
MCV: 87.9 fL (ref 80.0–100.0)
Monocytes Absolute: 1.1 10*3/uL — ABNORMAL HIGH (ref 0.1–1.0)
Monocytes Relative: 17 %
Neutro Abs: 2.7 10*3/uL (ref 1.7–7.7)
Neutrophils Relative %: 42 %
Platelets: 133 10*3/uL — ABNORMAL LOW (ref 150–400)
RBC: 2.14 MIL/uL — ABNORMAL LOW (ref 3.87–5.11)
RDW: 20.8 % — ABNORMAL HIGH (ref 11.5–15.5)
WBC: 6.6 10*3/uL (ref 4.0–10.5)
nRBC: 6 % — ABNORMAL HIGH (ref 0.0–0.2)

## 2021-03-31 NOTE — Progress Notes (Signed)
Critical Value   Test result: Hemoglobin 6.5  Time and Date notified: 03/31/21 at 1:28 pm  Provider notified: Hollis, [place], FNP  Action taken:  Patient to receive 1 unit PRBC on Monday 04/03/21

## 2021-03-31 NOTE — Progress Notes (Signed)
Patient's labs drawn (Type & Screen, CBC, CMP) from right ac. Hemoglobin 6.5. Provider notified and advised patient to come back on Monday 04/03/21 for transfusion of 1 unit PRBC. Patient scheduled for 12 noon on Monday. Blue blood band placed on patient's arm and patient advised not to remove band before transfusion. Patient alert, oriented and ambulatory at discharge.

## 2021-04-03 ENCOUNTER — Other Ambulatory Visit: Payer: Self-pay | Admitting: Family Medicine

## 2021-04-03 ENCOUNTER — Other Ambulatory Visit: Payer: Self-pay

## 2021-04-03 ENCOUNTER — Non-Acute Institutional Stay (HOSPITAL_COMMUNITY)
Admission: RE | Admit: 2021-04-03 | Discharge: 2021-04-03 | Disposition: A | Discharge status: Home / Self Care | Payer: Medicare Other | Source: Ambulatory Visit | Attending: Internal Medicine | Admitting: Internal Medicine

## 2021-04-03 DIAGNOSIS — D649 Anemia, unspecified: Secondary | ICD-10-CM

## 2021-04-03 LAB — HEMOGLOBIN AND HEMATOCRIT, BLOOD
HCT: 15.4 % — ABNORMAL LOW (ref 36.0–46.0)
HCT: 18.3 % — ABNORMAL LOW (ref 36.0–46.0)
HCT: 19.9 % — ABNORMAL LOW (ref 36.0–46.0)
Hemoglobin: 5.5 g/dL — CL (ref 12.0–15.0)
Hemoglobin: 6.3 g/dL — CL (ref 12.0–15.0)
Hemoglobin: 7 g/dL — ABNORMAL LOW (ref 12.0–15.0)

## 2021-04-03 LAB — PREPARE RBC (CROSSMATCH)

## 2021-04-03 MED ORDER — ACETAMINOPHEN 325 MG PO TABS
650.0000 mg | ORAL_TABLET | Freq: Once | ORAL | Status: AC
  Administered 2021-04-03: 650 mg via ORAL
  Filled 2021-04-03: qty 2

## 2021-04-03 MED ORDER — SODIUM CHLORIDE 0.9% IV SOLUTION: Freq: Once | INTRAVENOUS | Status: AC

## 2021-04-03 MED ORDER — DIPHENHYDRAMINE HCL 25 MG PO CAPS
25.0000 mg | ORAL_CAPSULE | Freq: Once | ORAL | Status: AC
  Administered 2021-04-03: 25 mg via ORAL
  Filled 2021-04-03: qty 1

## 2021-04-03 NOTE — Progress Notes (Signed)
Patient received via PIV 1 unit of packed red blood cells, ordered by [place] Hollis FNP. Type and screen was done before transfusion on 03/31/21. Pre transfusion H&H drawn. Joni Reining in lab reported critical value Hgb 6.3. [place] FNP made aware. Consent for blood products obtained from patient. Side effects reviewed with pt, verbalized understanding. Pre transfusion medications were given as ordered. Post transfusion H&H was drawn. Tolerated well, vitals stable, discharge instructions given, verbalized understanding. Patient alert, oriented and ambulatory at the time of discharge.

## 2021-04-04 LAB — TYPE AND SCREEN
ABO/RH(D): B POS
Antibody Screen: NEGATIVE
Unit division: 0

## 2021-04-04 LAB — BPAM RBC
Blood Product Expiration Date: 202210122359
ISSUE DATE / TIME: 202209121318
Unit Type and Rh: 9500

## 2021-04-28 ENCOUNTER — Other Ambulatory Visit: Payer: Self-pay | Admitting: Student

## 2021-04-28 DIAGNOSIS — Z1231 Encounter for screening mammogram for malignant neoplasm of breast: Secondary | ICD-10-CM

## 2021-05-09 ENCOUNTER — Ambulatory Visit: Payer: Medicare Other

## 2021-05-09 ENCOUNTER — Ambulatory Visit
Admission: RE | Admit: 2021-05-09 | Discharge: 2021-05-09 | Disposition: A | Discharge status: Home / Self Care | Payer: Medicare Other | Source: Ambulatory Visit | Attending: Student | Admitting: Student

## 2021-05-09 ENCOUNTER — Other Ambulatory Visit: Payer: Self-pay

## 2021-05-09 DIAGNOSIS — Z1231 Encounter for screening mammogram for malignant neoplasm of breast: Secondary | ICD-10-CM

## 2021-06-15 ENCOUNTER — Emergency Department (HOSPITAL_COMMUNITY)
Admission: EM | Admit: 2021-06-15 | Discharge: 2021-06-15 | Disposition: A | Discharge status: Home / Self Care | Payer: Medicare Other | Attending: Emergency Medicine | Admitting: Emergency Medicine

## 2021-06-15 ENCOUNTER — Encounter (HOSPITAL_COMMUNITY): Payer: Self-pay

## 2021-06-15 ENCOUNTER — Emergency Department (HOSPITAL_COMMUNITY): Payer: Medicare Other

## 2021-06-15 ENCOUNTER — Other Ambulatory Visit: Payer: Self-pay

## 2021-06-15 DIAGNOSIS — M79646 Pain in unspecified finger(s): Secondary | ICD-10-CM

## 2021-06-15 DIAGNOSIS — M79645 Pain in left finger(s): Secondary | ICD-10-CM | POA: Diagnosis not present

## 2021-06-15 DIAGNOSIS — Z79899 Other long term (current) drug therapy: Secondary | ICD-10-CM | POA: Diagnosis not present

## 2021-06-15 DIAGNOSIS — E039 Hypothyroidism, unspecified: Secondary | ICD-10-CM | POA: Insufficient documentation

## 2021-06-15 MED ORDER — PREDNISONE 20 MG PO TABS: 20.0000 mg | ORAL_TABLET | Freq: Every day | ORAL | 0 refills | Status: AC

## 2021-06-15 NOTE — ED Triage Notes (Signed)
Patient c/o left pinky finger swelling since yesterday. Patient states she had swelling of the left hand  last week due to arthritis, but that has resolved today.

## 2021-06-15 NOTE — ED Provider Notes (Signed)
[facility]-EMERGENCY DEPT Provider Note   CSN: [id number] Arrival date & time: 06/15/21  1333     History No chief complaint on file.   [name] is a 60 y.o. female.  HPI   60 y/o female with a h/o aneurysm, anxiety, arthritis, gallstones, hypokalemia, hypothyroidism, pneumonia, sickle cell anemia, who presents to the ED today for eval of finger 5th digit pain. Pain started a few days ago and has been constant. Pain is associated with swelling and warmth to the finger. She denies fevers. She does note that her entire hand was swollen earlier in the week but this has since improved since she has been wearing a splint. She denies any trauma.   Past Medical History:  Diagnosis Date   Aneurysm (HCC)    Anxiety    Arthritis    Colon ulcer    Gall stones    Hypokalemia    Hypothyroidism    Pneumonia    Sickle cell anemia (HCC)    Ulcers of both lower extremities (HCC)     Patient Active Problem List   Diagnosis Date Noted   Tobacco dependence 03/25/2021   Acute respiratory failure with hypoxia (HCC) 03/23/2021   Sickle cell anemia (HCC) 03/22/2021   Aneurysm (HCC)    Acute on chronic anemia 11/10/2019   Sickle cell pain crisis (HCC) 11/19/2017   Anemia 02/06/2017   Leg wound, right 02/06/2017   Sickle cell crisis (HCC) 10/24/2015   Anemia of chronic disease    Hepatitis C 05/11/2014   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   Thrombophlebitis leg superficial 03/04/2013   Rheumatoid arthritis (HCC) 12/16/2012   Thyroid disease 11/27/2011    Past Surgical History:  Procedure Laterality Date   ANKLE SURGERY  1989 and 1990   CEREBRAL ANEURYSM REPAIR     COLONOSCOPY  05/12/2012   Procedure: COLONOSCOPY;  Surgeon: Hart Carwin, MD;  Location: [facility];  Service: Endoscopy;  Laterality: N/A;   ESOPHAGOGASTRODUODENOSCOPY  05/12/2012   Procedure: ESOPHAGOGASTRODUODENOSCOPY (EGD);  Surgeon: Hart Carwin, MD;  Location: [facility];   Service: Endoscopy;  Laterality: N/A;   gall stone removal     GIVENS CAPSULE STUDY  05/13/2012   Procedure: GIVENS CAPSULE STUDY;  Surgeon: Hart Carwin, MD;  Location: [facility];  Service: Endoscopy;  Laterality: N/A;     OB History   No obstetric history on file.     Family History  Problem Relation Age of Onset   Cancer Mother 65       Esophageal   Sickle cell trait Mother    Sickle cell trait Father    HIV/AIDS Brother    Colon cancer Neg Hx     Social History   Tobacco Use   Smoking status: Never   Smokeless tobacco: Never  Vaping Use   Vaping Use: Never used  Substance Use Topics   Alcohol use: Yes    Comment: Occasional   Drug use: No    Home Medications Prior to Admission medications   Medication Sig Start Date End Date Taking? Authorizing Provider  predniSONE (DELTASONE) 20 MG tablet Take 1 tablet (20 mg total) by mouth daily for 7 days. 06/15/21 06/22/21 Yes Jeovany Huitron S, PA-C  albuterol (VENTOLIN HFA) 108 (90 Base) MCG/ACT inhaler Inhale 1-2 puffs into the lungs every 6 (six) hours as needed for wheezing or shortness of breath. 02/29/20   [provider]  ascorbic acid (VITAMIN C) 500 MG tablet Take 500  mg by mouth daily.    [provider]  cyclobenzaprine (FLEXERIL) 10 MG tablet Take 10 mg by mouth 2 (two) times daily as needed for muscle spasms. 05/19/20   [provider]  DULoxetine (CYMBALTA) 30 MG capsule Take 30 mg by mouth daily. 12/26/20   [provider]  gabapentin (NEURONTIN) 300 MG capsule Take 300 mg by mouth daily.    [provider]  ibuprofen (ADVIL) 800 MG tablet Take 1 tablet (800 mg total) by mouth every 8 (eight) hours as needed. 03/27/21   Massie Maroon, FNP  levothyroxine (SYNTHROID) 175 MCG tablet Take 175 mcg by mouth daily before breakfast.    [provider]  montelukast (SINGULAIR) 10 MG tablet Take 10 mg by mouth daily.    [provider]  omega-3 acid ethyl  esters (LOVAZA) 1 g capsule Take 1 g by mouth daily. 05/18/20   [provider]  VITAMIN D PO Take 1 capsule by mouth daily.    [provider]    Allergies    Ampicillin and Demerol [meperidine]  Review of Systems   Review of Systems  Constitutional:  Negative for fever.  Musculoskeletal:        Finger pain  Skin:  Negative for wound.  Neurological:  Negative for numbness.   Physical Exam Updated Vital Signs BP 116/68 (BP Location: Right Arm)   Pulse 68   Temp 98.9 F (37.2 C) (Oral)   Resp 16   Ht 5\' 11"  (1.803 m)   Wt 68 kg   SpO2 93%   BMI 20.92 kg/m   Physical Exam Vitals and nursing note reviewed.  Constitutional:      General: She is not in acute distress.    Appearance: She is well-developed.  HENT:     Head: Normocephalic and atraumatic.  Eyes:     Conjunctiva/sclera: Conjunctivae normal.  Cardiovascular:     Rate and Rhythm: Normal rate.  Pulmonary:     Effort: Pulmonary effort is normal.  Musculoskeletal:     Cervical back: Neck supple.     Comments: TTP along the right 5th digit. There is some warmth present. There is no significant erythema or open wounds. No TTP along the flexor tendon sheath. No fusiform swelling or pain with extension. Brisk cap refill  Skin:    General: Skin is warm and dry.  Neurological:     Mental Status: She is alert.    ED Results / Procedures / Treatments   Labs (all labs ordered are listed, but only abnormal results are displayed) Labs Reviewed - No data to display  EKG None  Radiology DG Finger Little Left  Result Date: 06/15/2021 CLINICAL DATA:  Fifth finger pain EXAM: LEFT LITTLE FINGER 2+V COMPARISON:  Left hand x-ray 01/07/2020 FINDINGS: No acute fracture or dislocation identified. Narrowing of the proximal interphalangeal joint with early marginal osteophytes. Soft tissue swelling of the finger. No radiopaque foreign body identified. IMPRESSION: Degenerative changes of the PIP joint.  Soft  tissue swelling. Electronically Signed   By: 01/09/2020 M.D.   On: 06/15/2021 14:42    Procedures Procedures   Medications Ordered in ED Medications - No data to display  ED Course  I have reviewed the triage vital signs and the nursing notes.  Pertinent labs & imaging results that were available during my care of the patient were reviewed by me and considered in my medical decision making (see chart for details).  Clinical Course as of 06/15/21  1451  Thu Jun 15, 2021  1406 With a history of rheumatoid arthritis presenting to ED with left fifth finger pain and swelling.  She reports this began spontaneously yesterday when she woke up.  She denies any trauma or injuries.  She says the left hand has been swollen and the wrist a bit more painful the past week but that swelling is gone down.  She is taken ibuprofen at home.  On exam she is afebrile.  She is able to fully flex and extend her finger.  She is no fusiform swelling of the fingers, no flexor tendon tenderness.  I have very low suspicion for flexor or extensor tenosynovitis or infection.  She does have some very mild erythema and swelling of the left fifth finger, most focused around her joints.  I suspect this may be related to rheumatoid arthritis.  We can put her on low-dose prednisone for several days.  Offered a finger splint for comfort.  She verbalized understanding.  We will obtain an x-ray to evaluate for occult fracture [MT]    Clinical Course User Index [MT] Trifan, Kermit Balo, MD   MDM Rules/Calculators/A&P                          60 y/o female presents for eval of left 5th finger pain. Hx RA. Denies injury. There is no sign of cellulitis or infectious process on exam. I suspect this is an inflammatory process. Xray obtained/reviewed and shows some degenerative changes, but there was otherwise no abnormality. She will be tx with a course of steroids. Advised to f/u and strict return precautions discussed. All questions  answered, pt stable for discharge   Final Clinical Impression(s) / ED Diagnoses Final diagnoses:  Pain of finger, unspecified laterality    Rx / DC Orders ED Discharge Orders          Ordered    predniSONE (DELTASONE) 20 MG tablet  Daily        06/15/21 1447             Karrie Meres, PA-C 06/15/21 1451    Terald Sleeper, MD 06/15/21 1456

## 2021-06-15 NOTE — Discharge Instructions (Signed)
Your xray showed that you likely have some arthritis in your finger. I have low suspicion for any sort of infection in your finger. I recommend treating you with a course of steroids to help with the inflammation.   You can wear the splint for comfort  Please follow up with your primary care provider within 5-7 days for re-evaluation of your symptoms. If you do not have a primary care provider, information for a healthcare clinic has been provided for you to make arrangements for follow up care. Please return to the emergency department for any new or worsening symptoms.

## 2021-07-21 ENCOUNTER — Telehealth: Payer: Self-pay | Admitting: Physician Assistant

## 2021-07-21 NOTE — Telephone Encounter (Signed)
Scheduled appt per 12/28 referral. Pt is aware of appt date and time. Pt is aware to arrive 15 mins prior to appt time.  °

## 2021-08-02 ENCOUNTER — Telehealth: Payer: Self-pay | Admitting: Physician Assistant

## 2021-08-02 NOTE — Progress Notes (Deleted)
[place] OFFICE PROGRESS NOTE  Cipriano Mile, NP [place] [address]  DIAGNOSIS: ***  PRIOR THERAPY:  CURRENT THERAPY:  INTERVAL HISTORY: [name] 61 y.o. female returns for *** regular *** visit for followup of ***   MEDICAL HISTORY: Past Medical History:  Diagnosis Date   Aneurysm ([place])    Anxiety    Arthritis    Colon ulcer    Gall stones    Hypokalemia    Hypothyroidism    Pneumonia    Sickle cell anemia (HCC)    Ulcers of both lower extremities (HCC)     ALLERGIES:  is allergic to ampicillin and demerol [meperidine].  MEDICATIONS:  Current Outpatient Medications  Medication Sig Dispense Refill   albuterol (VENTOLIN HFA) 108 (90 Base) MCG/ACT inhaler Inhale 1-2 puffs into the lungs every 6 (six) hours as needed for wheezing or shortness of breath.     ascorbic acid (VITAMIN C) 500 MG tablet Take 500 mg by mouth daily.     cyclobenzaprine (FLEXERIL) 10 MG tablet Take 10 mg by mouth 2 (two) times daily as needed for muscle spasms.     DULoxetine (CYMBALTA) 30 MG capsule Take 30 mg by mouth daily.     gabapentin (NEURONTIN) 300 MG capsule Take 300 mg by mouth daily.     ibuprofen (ADVIL) 800 MG tablet Take 1 tablet (800 mg total) by mouth every 8 (eight) hours as needed. 30 tablet 0   levothyroxine (SYNTHROID) 175 MCG tablet Take 175 mcg by mouth daily before breakfast.     montelukast (SINGULAIR) 10 MG tablet Take 10 mg by mouth daily.     omega-3 acid ethyl esters (LOVAZA) 1 g capsule Take 1 g by mouth daily.     VITAMIN D PO Take 1 capsule by mouth daily.     No current facility-administered medications for this visit.    SURGICAL HISTORY:  Past Surgical History:  Procedure Laterality Date   ANKLE SURGERY  1989 and 1990   CEREBRAL ANEURYSM REPAIR     COLONOSCOPY  05/12/2012   Procedure: COLONOSCOPY;  Surgeon: Lafayette Dragon, MD;  Location: [facility];  Service: Endoscopy;  Laterality: N/A;    ESOPHAGOGASTRODUODENOSCOPY  05/12/2012   Procedure: ESOPHAGOGASTRODUODENOSCOPY (EGD);  Surgeon: Lafayette Dragon, MD;  Location: [facility];  Service: Endoscopy;  Laterality: N/A;   gall stone removal     GIVENS CAPSULE STUDY  05/13/2012   Procedure: GIVENS CAPSULE STUDY;  Surgeon: Lafayette Dragon, MD;  Location: [facility];  Service: Endoscopy;  Laterality: N/A;    REVIEW OF SYSTEMS:   Review of Systems  Constitutional: Negative for appetite change, chills, fatigue, fever and unexpected weight change.  HENT:   Negative for mouth sores, nosebleeds, sore throat and trouble swallowing.   Eyes: Negative for eye problems and icterus.  Respiratory: Negative for cough, hemoptysis, shortness of breath and wheezing.   Cardiovascular: Negative for chest pain and leg swelling.  Gastrointestinal: Negative for abdominal pain, constipation, diarrhea, nausea and vomiting.  Genitourinary: Negative for bladder incontinence, difficulty urinating, dysuria, frequency and hematuria.   Musculoskeletal: Negative for back pain, gait problem, neck pain and neck stiffness.  Skin: Negative for itching and rash.  Neurological: Negative for dizziness, extremity weakness, gait problem, headaches, light-headedness and seizures.  Hematological: Negative for adenopathy. Does not bruise/bleed easily.  Psychiatric/Behavioral: Negative for confusion, depression and sleep disturbance. The patient is not nervous/anxious.     PHYSICAL EXAMINATION:  There were no vitals  taken for this visit.  ECOG PERFORMANCE STATUS: {CHL ONC ECOG [id number]  Physical Exam  Constitutional: Oriented to person, place, and time and well-developed, well-nourished, and in no distress. No distress.  HENT:  Head: Normocephalic and atraumatic.  Mouth/Throat: Oropharynx is clear and moist. No oropharyngeal exudate.  Eyes: Conjunctivae are normal. Right eye exhibits no discharge. Left eye exhibits no discharge. No scleral icterus.  Neck: Normal  range of motion. Neck supple.  Cardiovascular: Normal rate, regular rhythm, normal heart sounds and intact distal pulses.   Pulmonary/Chest: Effort normal and breath sounds normal. No respiratory distress. No wheezes. No rales.  Abdominal: Soft. Bowel sounds are normal. Exhibits no distension and no mass. There is no tenderness.  Musculoskeletal: Normal range of motion. Exhibits no edema.  Lymphadenopathy:    No cervical adenopathy.  Neurological: Alert and oriented to person, place, and time. Exhibits normal muscle tone. Gait normal. Coordination normal.  Skin: Skin is warm and dry. No rash noted. Not diaphoretic. No erythema. No pallor.  Psychiatric: Mood, memory and judgment normal.  Vitals reviewed.  LABORATORY DATA: Lab Results  Component Value Date   WBC 6.6 03/31/2021   HGB 7.0 (L) 04/03/2021   HCT 19.9 (L) 04/03/2021   MCV 87.9 03/31/2021   PLT 133 (L) 03/31/2021      Chemistry      Component Value Date/Time   NA 136 03/31/2021 1245   NA 137 11/18/2017 1608   K 3.7 03/31/2021 1245   CL 105 03/31/2021 1245   CO2 25 03/31/2021 1245   BUN 7 03/31/2021 1245   BUN 9 11/18/2017 1608   CREATININE 0.78 03/31/2021 1245   CREATININE 0.64 08/19/2020 1003      Component Value Date/Time   CALCIUM 9.2 03/31/2021 1245   ALKPHOS 69 03/31/2021 1245   AST 71 (H) 03/31/2021 1245   ALT 18 03/31/2021 1245   ALT 29 12/15/2019 0930   BILITOT 3.3 (H) 03/31/2021 1245   BILITOT 4.0 (H) 11/18/2017 1608       RADIOGRAPHIC STUDIES:  No results found.   ASSESSMENT/PLAN:  No problem-specific Assessment & Plan notes found for this encounter.   No orders of the defined types were placed in this encounter.    I spent {CHL ONC TIME VISIT - [id number] counseling the patient face to face. The total time spent in the appointment was {CHL ONC TIME VISIT - [id number].  Brean Carberry L Billye Nydam, PA-C 08/02/21

## 2021-08-02 NOTE — Telephone Encounter (Signed)
Tried again to reach pt, was able to speak to her and explain that we do not see pt's for her diagnosis. I let her know we will forward her referral on to the [facility]. She is aware her appts with [place] have been cancelled.

## 2021-08-02 NOTE — Telephone Encounter (Signed)
Cancelled pt's new hem appt per Cassie. Pt was being referred for Sickle Cell Disease, however Anemia was witting on the referral. We do not see pt's for Sickle Cell Disease. Reached out to pt three times, unable to reach her and vm was full. Will try again this afternoon.

## 2021-08-03 ENCOUNTER — Other Ambulatory Visit: Payer: Medicare Other

## 2021-08-03 ENCOUNTER — Encounter: Payer: Medicare Other | Admitting: Physician Assistant

## 2021-08-11 ENCOUNTER — Other Ambulatory Visit: Payer: Self-pay | Admitting: Student

## 2021-08-11 DIAGNOSIS — E039 Hypothyroidism, unspecified: Secondary | ICD-10-CM

## 2021-08-15 ENCOUNTER — Other Ambulatory Visit: Payer: Self-pay | Admitting: Student

## 2021-08-15 ENCOUNTER — Ambulatory Visit (INDEPENDENT_AMBULATORY_CARE_PROVIDER_SITE_OTHER): Payer: Medicare Other | Admitting: Family Medicine

## 2021-08-15 ENCOUNTER — Encounter: Payer: Self-pay | Admitting: Family Medicine

## 2021-08-15 ENCOUNTER — Telehealth: Payer: Self-pay | Admitting: Clinical

## 2021-08-15 ENCOUNTER — Other Ambulatory Visit: Payer: Self-pay

## 2021-08-15 VITALS — BP 125/62 | HR 78 | Temp 97.6°F | Ht 71.0 in | Wt 157.4 lb

## 2021-08-15 DIAGNOSIS — D571 Sickle-cell disease without crisis: Secondary | ICD-10-CM

## 2021-08-15 DIAGNOSIS — F17200 Nicotine dependence, unspecified, uncomplicated: Secondary | ICD-10-CM | POA: Diagnosis not present

## 2021-08-15 DIAGNOSIS — R319 Hematuria, unspecified: Secondary | ICD-10-CM

## 2021-08-15 DIAGNOSIS — E559 Vitamin D deficiency, unspecified: Secondary | ICD-10-CM | POA: Diagnosis not present

## 2021-08-15 DIAGNOSIS — D5700 Hb-SS disease with crisis, unspecified: Secondary | ICD-10-CM | POA: Diagnosis not present

## 2021-08-15 LAB — POCT URINALYSIS DIP (CLINITEK)
Bilirubin, UA: NEGATIVE
Glucose, UA: NEGATIVE mg/dL
Ketones, POC UA: NEGATIVE mg/dL
Leukocytes, UA: NEGATIVE
Nitrite, UA: NEGATIVE
POC PROTEIN,UA: NEGATIVE
Spec Grav, UA: 1.02 (ref 1.010–1.025)
Urobilinogen, UA: 2 E.U./dL — AB
pH, UA: 7 (ref 5.0–8.0)

## 2021-08-15 MED ORDER — IBUPROFEN 800 MG PO TABS: 800.0000 mg | ORAL_TABLET | Freq: Three times a day (TID) | ORAL | 2 refills | Status: DC | PRN

## 2021-08-15 NOTE — Telephone Encounter (Signed)
[facility] Case Management Referral Note  08/15/2021 Name: [name]  MRN: [id number] DOB: 12/07/1960 [name]  is a 61 y.o. year old female who sees Massie MaroonHollis, Lachina M, FNP for primary care. LCSW was consulted to assess patient's needs and assist the patient with  housing concerns .  Interpreter: No.   Interpreter Name & Language: none  Assessment: Patient experiencing Housing barriers - patient facing eviction.   Intervention: Patient referred to CSW by PCP during visit. Called patient to follow up. Patient reported she is involved in eviction proceedings with her landlord. Referred patient to [employer] [employer] (TEAM) program at [place] [employer] [place]. Also provided her with [employer] and [employer] phone number to call for advice or referrals. Advised patient that the [employer] has a wait list open for 55+ and provided contact information to apply.  Review of patient status, including review of consultants reports, relevant laboratory and other test results, and collaboration with appropriate care team members and the patient's provider was performed as part of comprehensive patient evaluation and provision of services.    Abigail ButtsSusan Toye Rouillard, LCSW [facility] [phone]

## 2021-08-15 NOTE — Progress Notes (Signed)
[facility]   Established Patient Office Visit  Subjective:  Patient ID: [name], female    DOB: 1960/10/22  Age: 61 y.o. MRN: [id number]  CC:  Chief Complaint  Patient presents with   Follow-up    Pt is here today for her follow up visit with no issues or concerns.    HPI [name] is a 61 year old female with a medical history significant for sickle cell disease, history of rheumatoid arthritis hypothyroidism, and history of anemia of chronic disease presents for follow-up of chronic conditions.  Patient has a primary complaint of mild fatigue.  She states that she has been feeling extremely tired over the past several days.  Patient states that she has been taking all prescribed medications consistently.  Patient was previously followed by her rheumatology for ongoing rheumatoid arthritis.  She states that she warrants surgery on her left wrist.  She is complaining of joint pain rated 4/10.  She is not taking any medications at this time.  Patient was previously on Remicade for rheumatoid arthritis, but has not not been on medications for quite some time. Patient denies any headache, dizziness, shortness of breath, urinary symptoms, nausea, vomiting, or diarrhea.  Past Medical History:  Diagnosis Date   Aneurysm ([place])    Anxiety    Arthritis    Colon ulcer    Gall stones    Hypokalemia    Hypothyroidism    Pneumonia    Sickle cell anemia ([place])    Ulcers of both lower extremities [facility])     Past Surgical History:  Procedure Laterality Date   ANKLE SURGERY  1989 and 1990   CEREBRAL ANEURYSM REPAIR     COLONOSCOPY  05/12/2012   Procedure: COLONOSCOPY;  Surgeon: Lafayette Dragon, MD;  Location: [facility];  Service: Endoscopy;  Laterality: N/A;   ESOPHAGOGASTRODUODENOSCOPY  05/12/2012   Procedure: ESOPHAGOGASTRODUODENOSCOPY (EGD);  Surgeon: Lafayette Dragon, MD;  Location: [facility];  Service: Endoscopy;  Laterality: N/A;    gall stone removal     GIVENS CAPSULE STUDY  05/13/2012   Procedure: GIVENS CAPSULE STUDY;  Surgeon: Lafayette Dragon, MD;  Location: [facility];  Service: Endoscopy;  Laterality: N/A;    Family History  Problem Relation Age of Onset   Cancer Mother 16       Esophageal   Sickle cell trait Mother    Sickle cell trait Father    HIV/AIDS Brother    Colon cancer Neg Hx     Social History   Socioeconomic History   Marital status: Single    Spouse name: Not on file   Number of children: Not on file   Years of education: Not on file   Highest education level: Not on file  Occupational History   Not on file  Tobacco Use   Smoking status: Never   Smokeless tobacco: Never  Vaping Use   Vaping Use: Never used  Substance and Sexual Activity   Alcohol use: Yes    Comment: Occasional   Drug use: No   Sexual activity: Not Currently  Other Topics Concern   Not on file  Social History Narrative   Not on file   Social Determinants of Health   Financial Resource Strain: Not on file  Food Insecurity: Not on file  Transportation Needs: Not on file  Physical Activity: Not on file  Stress: Not on file  Social Connections: Not on file  Intimate Partner  Violence: Not on file    Outpatient Medications Prior to Visit  Medication Sig Dispense Refill   albuterol (VENTOLIN HFA) 108 (90 Base) MCG/ACT inhaler Inhale 1-2 puffs into the lungs every 6 (six) hours as needed for wheezing or shortness of breath.     ascorbic acid (VITAMIN C) 500 MG tablet Take 500 mg by mouth daily.     cyclobenzaprine (FLEXERIL) 10 MG tablet Take 10 mg by mouth 2 (two) times daily as needed for muscle spasms.     DULoxetine (CYMBALTA) 30 MG capsule Take 30 mg by mouth daily.     gabapentin (NEURONTIN) 300 MG capsule Take 300 mg by mouth daily.     ibuprofen (ADVIL) 800 MG tablet Take 1 tablet (800 mg total) by mouth every 8 (eight) hours as needed. 30 tablet 0   levothyroxine (SYNTHROID) 175 MCG tablet Take  175 mcg by mouth daily before breakfast.     montelukast (SINGULAIR) 10 MG tablet Take 10 mg by mouth daily.     omega-3 acid ethyl esters (LOVAZA) 1 g capsule Take 1 g by mouth daily.     VITAMIN D PO Take 1 capsule by mouth daily.     Calcium Carb-Cholecalciferol 600-20 MG-MCG CHEW See admin instructions.     folic acid (FOLVITE) 1 MG tablet Take 1 tablet by mouth daily.     Omega-3 1000 MG CAPS Take by mouth.     No facility-administered medications prior to visit.    Allergies  Allergen Reactions   Ampicillin Other (See Comments)    Caused red bumps on the skin, but nothing else Per patient, she tolerates cephalosporins without problems (??)   Demerol [Meperidine] Other (See Comments)    A high(er) dose caused seizure(s)    ROS Review of Systems  Constitutional:  Positive for fatigue.  HENT: Negative.    Eyes: Negative.   Cardiovascular: Negative.   Endocrine: Negative.   Genitourinary: Negative.   Musculoskeletal:  Positive for arthralgias, back pain and joint swelling.  Skin: Negative.   Psychiatric/Behavioral: Negative.       Objective:    Physical Exam Constitutional:      Appearance: Normal appearance.  Cardiovascular:     Rate and Rhythm: Normal rate and regular rhythm.     Pulses: Normal pulses.  Pulmonary:     Effort: Pulmonary effort is normal.  Abdominal:     General: Bowel sounds are normal.  Musculoskeletal:        General: Normal range of motion.  Skin:    General: Skin is warm.  Neurological:     General: No focal deficit present.     Mental Status: She is alert. Mental status is at baseline.  Psychiatric:        Mood and Affect: Mood normal.        Behavior: Behavior normal.        Thought Content: Thought content normal.        Judgment: Judgment normal.    BP 125/62    Pulse 78    Temp 97.6 F (36.4 C)    Ht 5\' 11"  (1.803 m)    Wt 157 lb 6.4 oz (71.4 kg)    SpO2 95%    BMI 21.95 kg/m  Wt Readings from Last 3 Encounters:  08/15/21 157  lb 6.4 oz (71.4 kg)  06/15/21 150 lb (68 kg)  03/27/21 159 lb 13.3 oz (72.5 kg)     Health Maintenance Due  Topic Date Due  Zoster Vaccines- Shingrix (1 of 2) Never done   COVID-19 Vaccine (3 - Pfizer risk series) 07/01/2020   PAP SMEAR-Modifier  12/17/2020   INFLUENZA VACCINE  02/20/2021    There are no preventive care reminders to display for this patient.  Lab Results  Component Value Date   TSH 147.044 (H) 11/10/2019   Lab Results  Component Value Date   WBC 6.6 03/31/2021   HGB 7.0 (L) 04/03/2021   HCT 19.9 (L) 04/03/2021   MCV 87.9 03/31/2021   PLT 133 (L) 03/31/2021   Lab Results  Component Value Date   NA 136 03/31/2021   K 3.7 03/31/2021   CO2 25 03/31/2021   GLUCOSE 102 (H) 03/31/2021   BUN 7 03/31/2021   CREATININE 0.78 03/31/2021   BILITOT 3.3 (H) 03/31/2021   ALKPHOS 69 03/31/2021   AST 71 (H) 03/31/2021   ALT 18 03/31/2021   PROT 8.2 (H) 03/31/2021   ALBUMIN 4.1 03/31/2021   CALCIUM 9.2 03/31/2021   ANIONGAP 6 03/31/2021   Lab Results  Component Value Date   CHOL 133 05/11/2014   Lab Results  Component Value Date   HDL 49 05/11/2014   Lab Results  Component Value Date   LDLCALC 66 05/11/2014   Lab Results  Component Value Date   TRIG 89 05/11/2014   Lab Results  Component Value Date   CHOLHDL 2.7 05/11/2014   No results found for: HGBA1C    Assessment & Plan:   Problem List Items Addressed This Visit       Other   Sickle cell crisis ([place]) - Primary   Relevant Orders   POCT URINALYSIS DIP ([facility])   Sickle cell anemia ([place])   Relevant Orders   Sickle Cell Panel   Other Visit Diagnoses     Vitamin D deficiency       Relevant Orders   Sickle Cell Panel      We will follow-up with patient by phone with all laboratory results.  If her hemoglobin is less than baseline, will transfuse in sickle cell clinic.  Patient advised to schedule follow-up with her rheumatologist.  Follow-up: Return in about 3 months (around  11/13/2021) for sickle cell anemia.    Donia Pounds  APRN, MSN, FNP-C [facility] [address] [place], [place] [address] [phone]

## 2021-08-16 ENCOUNTER — Other Ambulatory Visit: Payer: Self-pay | Admitting: Family Medicine

## 2021-08-16 NOTE — Progress Notes (Signed)
[name] is a 61 year old female with a medical history significant for sickle cell disease was evaluated on 08/15/2021. On review of labs, patient found to have a hemoglobin of 5.9, which is below her baseline. Patient will return to clinic on 1//26/2023 for 2 units PRBCs. Patient denies any headache, dizziness, chest pain, fatigue or blurred vision.    The patient was given clear instructions to go to ER or return to medical center if symptoms do not improve, worsen or new problems develop. The patient verbalized understanding.    Nolon Nations  APRN, MSN, FNP-C [facility] [address] [place], [place] [address] [phone]

## 2021-08-17 ENCOUNTER — Other Ambulatory Visit: Payer: Self-pay

## 2021-08-17 ENCOUNTER — Non-Acute Institutional Stay (HOSPITAL_COMMUNITY)
Admission: RE | Admit: 2021-08-17 | Discharge: 2021-08-17 | Disposition: A | Discharge status: Home / Self Care | Payer: Medicare Other | Source: Ambulatory Visit | Attending: Internal Medicine | Admitting: Internal Medicine

## 2021-08-17 DIAGNOSIS — D5700 Hb-SS disease with crisis, unspecified: Secondary | ICD-10-CM | POA: Diagnosis present

## 2021-08-17 LAB — CMP14+CBC/D/PLT+FER+RETIC+V...
ALT: 9 IU/L (ref 0–32)
AST: 65 IU/L — ABNORMAL HIGH (ref 0–40)
Albumin/Globulin Ratio: 1.3 (ref 1.2–2.2)
Albumin: 4 g/dL (ref 3.8–4.8)
Alkaline Phosphatase: 82 IU/L (ref 44–121)
BUN/Creatinine Ratio: 8 — ABNORMAL LOW (ref 12–28)
BUN: 5 mg/dL — ABNORMAL LOW (ref 8–27)
Basophils Absolute: 0.1 10*3/uL (ref 0.0–0.2)
Basos: 1 %
Bilirubin Total: 4.3 mg/dL — ABNORMAL HIGH (ref 0.0–1.2)
CO2: 19 mmol/L — ABNORMAL LOW (ref 20–29)
Calcium: 9.4 mg/dL (ref 8.7–10.3)
Chloride: 105 mmol/L (ref 96–106)
Creatinine, Ser: 0.64 mg/dL (ref 0.57–1.00)
EOS (ABSOLUTE): 0.9 10*3/uL — ABNORMAL HIGH (ref 0.0–0.4)
Eos: 10 %
Ferritin: 174 ng/mL — ABNORMAL HIGH (ref 15–150)
Globulin, Total: 3.2 g/dL (ref 1.5–4.5)
Glucose: 93 mg/dL (ref 70–99)
Hematocrit: 16.3 % — CL (ref 34.0–46.6)
Hemoglobin: 5.9 g/dL — CL (ref 11.1–15.9)
Immature Grans (Abs): 0.1 10*3/uL (ref 0.0–0.1)
Immature Granulocytes: 1 %
Lymphocytes Absolute: 3.2 10*3/uL — ABNORMAL HIGH (ref 0.7–3.1)
Lymphs: 36 %
MCH: 31.9 pg (ref 26.6–33.0)
MCHC: 36.2 g/dL — ABNORMAL HIGH (ref 31.5–35.7)
MCV: 88 fL (ref 79–97)
Monocytes Absolute: 1.2 10*3/uL — ABNORMAL HIGH (ref 0.1–0.9)
Monocytes: 14 %
NRBC: 24 % — ABNORMAL HIGH (ref 0–0)
Neutrophils Absolute: 3.5 10*3/uL (ref 1.4–7.0)
Neutrophils: 38 %
Platelets: 193 10*3/uL (ref 150–450)
Potassium: 4.2 mmol/L (ref 3.5–5.2)
RBC: 1.85 x10E6/uL — CL (ref 3.77–5.28)
RDW: 21.4 % — ABNORMAL HIGH (ref 11.7–15.4)
Retic Ct Pct: 21.2 % — ABNORMAL HIGH (ref 0.6–2.6)
Sodium: 140 mmol/L (ref 134–144)
Total Protein: 7.2 g/dL (ref 6.0–8.5)
Vit D, 25-Hydroxy: 20.8 ng/mL — ABNORMAL LOW (ref 30.0–100.0)
WBC: 9 10*3/uL (ref 3.4–10.8)
eGFR: 100 mL/min/{1.73_m2} (ref >59)

## 2021-08-17 LAB — URINE CULTURE

## 2021-08-17 LAB — HEMOGLOBIN AND HEMATOCRIT, BLOOD
HCT: 20.8 % — ABNORMAL LOW (ref 36.0–46.0)
Hemoglobin: 7.5 g/dL — ABNORMAL LOW (ref 12.0–15.0)

## 2021-08-17 LAB — PREPARE RBC (CROSSMATCH)

## 2021-08-17 MED ORDER — DIPHENHYDRAMINE HCL 50 MG/ML IJ SOLN
25.0000 mg | Freq: Once | INTRAMUSCULAR | Status: AC
  Administered 2021-08-17: 25 mg via INTRAVENOUS
  Filled 2021-08-17: qty 1

## 2021-08-17 MED ORDER — ACETAMINOPHEN 325 MG PO TABS
650.0000 mg | ORAL_TABLET | Freq: Once | ORAL | Status: AC
  Administered 2021-08-17: 650 mg via ORAL
  Filled 2021-08-17: qty 2

## 2021-08-17 MED ORDER — SODIUM CHLORIDE 0.9% IV SOLUTION: Freq: Once | INTRAVENOUS | Status: AC

## 2021-08-17 NOTE — Progress Notes (Signed)
PATIENT CARE CENTER NOTE   Diagnosis: Sickle cell anemia    Provider: Hollis, [place], FNP   Procedure: 2 units PRBC's   Note:  Patient received 2 units of PRBCs via PIV. Pre-medications given (Tylenol 650 and Benadryl 25 mg) per order. Patient tolerated transfusions well with no adverse reaction.Vital signs remained stable. Post transfusion H & H drawn 1 hour after end of transfusion. Discharge instructions given. Patient alert, oriented and ambulatory at discharge.

## 2021-08-18 LAB — BPAM RBC
Blood Product Expiration Date: 202302282359
Blood Product Expiration Date: 202302282359
ISSUE DATE / TIME: 202301261008
ISSUE DATE / TIME: 202301261008
Unit Type and Rh: 9500
Unit Type and Rh: 9500

## 2021-08-18 LAB — TYPE AND SCREEN
ABO/RH(D): B POS
Antibody Screen: NEGATIVE
Unit division: 0
Unit division: 0

## 2021-08-25 ENCOUNTER — Ambulatory Visit
Admission: RE | Admit: 2021-08-25 | Discharge: 2021-08-25 | Disposition: A | Discharge status: Home / Self Care | Payer: Medicare Other | Source: Ambulatory Visit | Attending: Student | Admitting: Student

## 2021-08-25 DIAGNOSIS — D5700 Hb-SS disease with crisis, unspecified: Secondary | ICD-10-CM

## 2021-08-25 DIAGNOSIS — E039 Hypothyroidism, unspecified: Secondary | ICD-10-CM

## 2021-11-21 ENCOUNTER — Ambulatory Visit: Payer: Medicare Other | Admitting: Family Medicine

## 2021-12-09 ENCOUNTER — Other Ambulatory Visit: Payer: Self-pay

## 2021-12-09 ENCOUNTER — Encounter (HOSPITAL_COMMUNITY): Payer: Self-pay | Admitting: Emergency Medicine

## 2021-12-09 ENCOUNTER — Emergency Department (HOSPITAL_COMMUNITY)
Admission: EM | Admit: 2021-12-09 | Discharge: 2021-12-09 | Disposition: A | Discharge status: Home / Self Care | Payer: Medicare Other | Attending: Emergency Medicine | Admitting: Emergency Medicine

## 2021-12-09 DIAGNOSIS — D571 Sickle-cell disease without crisis: Secondary | ICD-10-CM

## 2021-12-09 DIAGNOSIS — D649 Anemia, unspecified: Secondary | ICD-10-CM | POA: Diagnosis present

## 2021-12-09 LAB — CBC WITH DIFFERENTIAL/PLATELET
Abs Immature Granulocytes: 0.05 10*3/uL (ref 0.00–0.07)
Basophils Absolute: 0.1 10*3/uL (ref 0.0–0.1)
Basophils Relative: 1 %
Eosinophils Absolute: 0.9 10*3/uL — ABNORMAL HIGH (ref 0.0–0.5)
Eosinophils Relative: 10 %
HCT: 17.7 % — ABNORMAL LOW (ref 36.0–46.0)
Hemoglobin: 6.4 g/dL — CL (ref 12.0–15.0)
Immature Granulocytes: 1 %
Lymphocytes Relative: 44 %
Lymphs Abs: 3.8 10*3/uL (ref 0.7–4.0)
MCH: 32.2 pg (ref 26.0–34.0)
MCHC: 36.2 g/dL — ABNORMAL HIGH (ref 30.0–36.0)
MCV: 88.9 fL (ref 80.0–100.0)
Monocytes Absolute: 0.8 10*3/uL (ref 0.1–1.0)
Monocytes Relative: 9 %
Neutro Abs: 3 10*3/uL (ref 1.7–7.7)
Neutrophils Relative %: 35 %
Platelets: 188 10*3/uL (ref 150–400)
RBC: 1.99 MIL/uL — ABNORMAL LOW (ref 3.87–5.11)
RDW: 25.8 % — ABNORMAL HIGH (ref 11.5–15.5)
WBC: 8.6 10*3/uL (ref 4.0–10.5)
nRBC: 2.6 % — ABNORMAL HIGH (ref 0.0–0.2)

## 2021-12-09 LAB — COMPREHENSIVE METABOLIC PANEL
ALT: 15 U/L (ref 0–44)
AST: 68 U/L — ABNORMAL HIGH (ref 15–41)
Albumin: 4 g/dL (ref 3.5–5.0)
Alkaline Phosphatase: 73 U/L (ref 38–126)
Anion gap: 8 (ref 5–15)
BUN: 7 mg/dL — ABNORMAL LOW (ref 8–23)
CO2: 22 mmol/L (ref 22–32)
Calcium: 9.4 mg/dL (ref 8.9–10.3)
Chloride: 110 mmol/L (ref 98–111)
Creatinine, Ser: 0.74 mg/dL (ref 0.44–1.00)
GFR, Estimated: >60 mL/min (ref >60)
Glucose, Bld: 107 mg/dL — ABNORMAL HIGH (ref 70–99)
Potassium: 3.8 mmol/L (ref 3.5–5.1)
Sodium: 140 mmol/L (ref 135–145)
Total Bilirubin: 4.2 mg/dL — ABNORMAL HIGH (ref 0.3–1.2)
Total Protein: 8.2 g/dL — ABNORMAL HIGH (ref 6.5–8.1)

## 2021-12-09 LAB — PREPARE RBC (CROSSMATCH)

## 2021-12-09 MED ORDER — SODIUM CHLORIDE 0.9 % IV SOLN: 10.0000 mL/h | Freq: Once | INTRAVENOUS | Status: DC

## 2021-12-09 MED ORDER — DIPHENHYDRAMINE HCL 50 MG/ML IJ SOLN
12.5000 mg | Freq: Once | INTRAMUSCULAR | Status: AC
  Administered 2021-12-09: 12.5 mg via INTRAVENOUS
  Filled 2021-12-09: qty 1

## 2021-12-09 NOTE — ED Provider Notes (Signed)
[facility]-EMERGENCY DEPT Provider Note   CSN: [id number] Arrival date & time: 12/09/21  4098     History  Chief Complaint  Patient presents with   Anemia    [name] is a 61 y.o. female.  Patient presents to the emergency department for anemia.  Patient has a history of sickle cell disease.  She reports that she has been feeling tired lately, no fever, chills, chest pain, heart palpitations, passing out.  She has not been experiencing any sickle cell symptoms.  She had blood work drawn by her primary doctor and was called at 1 AM and told to go to the ER because her hemoglobin was low.  She has not had any bleeding.      Home Medications Prior to Admission medications   Medication Sig Start Date End Date Taking? Authorizing Provider  albuterol (VENTOLIN HFA) 108 (90 Base) MCG/ACT inhaler Inhale 1-2 puffs into the lungs every 6 (six) hours as needed for wheezing or shortness of breath. 02/29/20   [provider]  ascorbic acid (VITAMIN C) 500 MG tablet Take 500 mg by mouth daily.    [provider]  Calcium Carb-Cholecalciferol 600-20 MG-MCG CHEW See admin instructions.    [provider]  cyclobenzaprine (FLEXERIL) 10 MG tablet Take 10 mg by mouth 2 (two) times daily as needed for muscle spasms. 05/19/20   [provider]  DULoxetine (CYMBALTA) 30 MG capsule Take 30 mg by mouth daily. 12/26/20   [provider]  folic acid (FOLVITE) 1 MG tablet Take 1 tablet by mouth daily.    [provider]  gabapentin (NEURONTIN) 300 MG capsule Take 300 mg by mouth daily.    [provider]  ibuprofen (ADVIL) 800 MG tablet Take 1 tablet (800 mg total) by mouth every 8 (eight) hours as needed. 08/15/21   Massie Maroon, FNP  levothyroxine (SYNTHROID) 175 MCG tablet Take 175 mcg by mouth daily before breakfast.    [provider]  montelukast (SINGULAIR) 10 MG tablet Take 10 mg by mouth daily.     [provider]  Omega-3 1000 MG CAPS Take by mouth.    [provider]  omega-3 acid ethyl esters (LOVAZA) 1 g capsule Take 1 g by mouth daily. 05/18/20   [provider]  VITAMIN D PO Take 1 capsule by mouth daily.    [provider]      Allergies    Ampicillin and Demerol [meperidine]    Review of Systems   Review of Systems  Physical Exam Updated Vital Signs BP (!) 149/71 (BP Location: Right Arm)   Pulse 66   Temp 98.2 F (36.8 C) (Oral)   Resp 16   Ht  (1.803 m)   Wt 70.3 kg   SpO2 92%   BMI 21.62 kg/m  Physical Exam Vitals and nursing note reviewed.  Constitutional:      General: She is not in acute distress.    Appearance: She is well-developed.  HENT:     Head: Normocephalic and atraumatic.     Mouth/Throat:     Mouth: Mucous membranes are moist.  Eyes:     General: Vision grossly intact. Gaze aligned appropriately.     Extraocular Movements: Extraocular movements intact.     Conjunctiva/sclera: Conjunctivae normal.  Cardiovascular:     Rate and Rhythm: Normal rate and regular rhythm.     Pulses: Normal pulses.     Heart sounds: Normal heart sounds,  S1 normal and S2 normal. No murmur heard.   No friction rub. No gallop.  Pulmonary:     Effort: Pulmonary effort is normal. No respiratory distress.     Breath sounds: Normal breath sounds.  Abdominal:     General: Bowel sounds are normal.     Palpations: Abdomen is soft.     Tenderness: There is no abdominal tenderness. There is no guarding or rebound.     Hernia: No hernia is present.  Musculoskeletal:        General: No swelling.     Cervical back: Full passive range of motion without pain, normal range of motion and neck supple. No spinous process tenderness or muscular tenderness. Normal range of motion.     Right lower leg: No edema.     Left lower leg: No edema.  Skin:    General: Skin is warm and dry.     Capillary Refill: Capillary refill takes less than 2  seconds.     Findings: No ecchymosis, erythema, rash or wound.  Neurological:     General: No focal deficit present.     Mental Status: She is alert and oriented to person, place, and time.     GCS: GCS eye subscore is 4. GCS verbal subscore is 5. GCS motor subscore is 6.     Cranial Nerves: Cranial nerves 2-12 are intact.     Sensory: Sensation is intact.     Motor: Motor function is intact.     Coordination: Coordination is intact.  Psychiatric:        Attention and Perception: Attention normal.        Mood and Affect: Mood normal.        Speech: Speech normal.        Behavior: Behavior normal.    ED Results / Procedures / Treatments   Labs (all labs ordered are listed, but only abnormal results are displayed) Labs Reviewed  CBC WITH DIFFERENTIAL/PLATELET - Abnormal; Notable for the following components:      Result Value   RBC 1.99 (*)    Hemoglobin 6.4 (*)    HCT 17.7 (*)    MCHC 36.2 (*)    RDW 25.8 (*)    nRBC 2.6 (*)    Eosinophils Absolute 0.9 (*)    All other components within normal limits  COMPREHENSIVE METABOLIC PANEL - Abnormal; Notable for the following components:   Glucose, Bld 107 (*)    BUN 7 (*)    Total Protein 8.2 (*)    AST 68 (*)    Total Bilirubin 4.2 (*)    All other components within normal limits  TYPE AND SCREEN    EKG None  Radiology No results found.  Procedures Procedures    Medications Ordered in ED Medications - No data to display  ED Course/ Medical Decision Making/ A&P                           Medical Decision Making Problems Addressed: Sickle cell disease without crisis [facility](HCC): chronic illness or injury Symptomatic anemia: acute illness or injury that poses a threat to life or bodily functions  Amount and/or Complexity of Data Reviewed External Data Reviewed: labs. Labs: ordered. Decision-making details documented in ED Course.   Presents for anemia.  Patient has a history of sickle cell disease.  She is not  experiencing a sickle cell crisis.  Hemoglobin is low, she was told to come to  the ER by her doctor to get a transfusion.  It appears that her baseline is around 7.  We will give 1 unit packed red blood cells.        Final Clinical Impression(s) / ED Diagnoses Final diagnoses:  Symptomatic anemia  Sickle cell disease without crisis [facility])    Rx / DC Orders ED Discharge Orders     None         Lamaria Hildebrandt, Canary Brim, MD 12/09/21 [id number]

## 2021-12-09 NOTE — ED Notes (Signed)
Pt d/c home per MD order. Discharge summary reviewed, pt verbalizes understanding. No s/s of acute distress noted at discharge.  

## 2021-12-09 NOTE — ED Triage Notes (Signed)
Pt reports she was called by MD and was told HBG was 6.0. She has hx of sickle cell. Denies any symptoms except fatigue. Denies SOB or dizziness and color looks good.

## 2021-12-09 NOTE — ED Notes (Signed)
Patient reports her skin is itching. Says this happens "from time to time" with her transfusions. Patient requesting benadryl. This nurse notified MD Rubin Payor of itching concern and request for benadryl

## 2021-12-10 LAB — TYPE AND SCREEN
ABO/RH(D): B POS
Antibody Screen: NEGATIVE
Unit division: 0

## 2021-12-10 LAB — BPAM RBC
Blood Product Expiration Date: 202306082359
ISSUE DATE / TIME: 202305200554
Unit Type and Rh: 7300

## 2021-12-26 ENCOUNTER — Encounter: Payer: Self-pay | Admitting: Family Medicine

## 2021-12-26 ENCOUNTER — Ambulatory Visit (INDEPENDENT_AMBULATORY_CARE_PROVIDER_SITE_OTHER): Payer: 59 | Admitting: Family Medicine

## 2021-12-26 ENCOUNTER — Other Ambulatory Visit: Payer: Self-pay | Admitting: Family Medicine

## 2021-12-26 VITALS — BP 125/66 | HR 67 | Temp 98.2°F | Ht 71.0 in | Wt 151.0 lb

## 2021-12-26 DIAGNOSIS — E079 Disorder of thyroid, unspecified: Secondary | ICD-10-CM

## 2021-12-26 DIAGNOSIS — D571 Sickle-cell disease without crisis: Secondary | ICD-10-CM | POA: Diagnosis not present

## 2021-12-26 DIAGNOSIS — E559 Vitamin D deficiency, unspecified: Secondary | ICD-10-CM

## 2021-12-26 DIAGNOSIS — M62830 Muscle spasm of back: Secondary | ICD-10-CM

## 2021-12-26 DIAGNOSIS — F3289 Other specified depressive episodes: Secondary | ICD-10-CM | POA: Diagnosis not present

## 2021-12-26 MED ORDER — DULOXETINE HCL 30 MG PO CPEP: 30.0000 mg | ORAL_CAPSULE | Freq: Every day | ORAL | 1 refills | Status: DC

## 2021-12-26 MED ORDER — FOLIC ACID 1 MG PO TABS: 1.0000 mg | ORAL_TABLET | Freq: Every day | ORAL | 1 refills | Status: DC

## 2021-12-26 MED ORDER — CYCLOBENZAPRINE HCL 10 MG PO TABS: 10.0000 mg | ORAL_TABLET | Freq: Every day | ORAL | 2 refills | Status: DC

## 2021-12-26 NOTE — Progress Notes (Signed)
[facility]   Established Patient Office Visit  Subjective   Patient ID: [name], female    DOB: 09/10/1960  Age: 61 y.o. MRN: [id number]  Chief Complaint  Patient presents with   Follow-up    3 month follow up;Sickle Cell Anemia Patient states that she is still homeless. Patient is also needing refills on her medications.    [name] is a 61 year old female with a medical history significant for sickle cell disease, hypothyroidism on Synthroid, and history of anemia of chronic disease that presents for follow-up of chronic conditions.  Patient states that she is doing well today.  She was last evaluated in the emergency room on 12/09/2021 after experiencing several days of fatigue.  She was found to have a hemoglobin below her baseline and received 2 units of blood. Patient continues to complain of fatigue.  She denies headache, chest pain, urinary symptoms, nausea, vomiting, or diarrhea.  She is not having any sickle cell related pain on today.   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   Thrombophlebitis leg superficial 03/04/2013   Rheumatoid arthritis (HCC) 12/16/2012   Thyroid disease 11/27/2011   Past Medical History:  Diagnosis Date   Aneurysm (HCC)    Anxiety    Arthritis    Colon ulcer    Gall stones    Hypokalemia    Hypothyroidism    Pneumonia    Sickle cell anemia (HCC)    Ulcers of both lower extremities (HCC)    Past Surgical History:  Procedure Laterality Date   ANKLE SURGERY  1989 and 1990   CEREBRAL  ANEURYSM REPAIR     COLONOSCOPY  05/12/2012   Procedure: COLONOSCOPY;  Surgeon: Hart Carwinora M Brodie, MD;  Location: [facility];  Service: Endoscopy;  Laterality: N/A;   ESOPHAGOGASTRODUODENOSCOPY  05/12/2012   Procedure: ESOPHAGOGASTRODUODENOSCOPY (EGD);  Surgeon: Hart Carwinora M Brodie, MD;  [facility];  Service: Endoscopy;  Laterality: N/A;   gall stone removal     GIVENS CAPSULE STUDY  05/13/2012   Procedure: GIVENS CAPSULE STUDY;  Surgeon: Hart Carwinora M Brodie, MD;  Location: [facility];  Service: Endoscopy;  Laterality: N/A;   Social History   Tobacco Use   Smoking status: Never   Smokeless tobacco: Never  Vaping Use   Vaping Use: Never used  Substance Use Topics   Alcohol use: Yes    Comment: Occasional   Drug use: No   Social History   Socioeconomic History   Marital status: Single    Spouse name: Not on file   Number of children: Not on file   Years of education: Not on file   Highest education level: Not on file  Occupational History   Not on file  Tobacco Use   Smoking status: Never   Smokeless tobacco: Never  Vaping Use   Vaping Use: Never used  Substance and Sexual Activity   Alcohol use: Yes    Comment: Occasional   Drug use: No   Sexual activity: Not Currently  Other Topics  Concern   Not on file  Social History Narrative   Not on file   Social Determinants of Health   Financial Resource Strain: Not on file  Food Insecurity: Not on file  Transportation Needs: Not on file  Physical Activity: Not on file  Stress: Not on file  Social Connections: Not on file  Intimate Partner Violence: Not on file   Family Status  Relation Name Status   Mother  Alive   Father  Deceased   Sister  Alive   Brother  Alive   Sister  Alive   Neg Hx  (Not Specified)   Family History  Problem Relation Age of Onset   Cancer Mother 35       Esophageal   Sickle cell trait Mother    Sickle cell trait Father    HIV/AIDS Brother    Colon cancer Neg Hx    Allergies   Allergen Reactions   Ampicillin Other (See Comments)    Caused red bumps on the skin, but nothing else Per patient, she tolerates cephalosporins without problems (??)   Demerol [Meperidine] Other (See Comments)    A high(er) dose caused seizure(s)      Review of Systems  Constitutional: Negative.  Negative for chills and fever.  HENT: Negative.    Eyes: Negative.   Respiratory: Negative.    Cardiovascular: Negative.   Gastrointestinal: Negative.   Genitourinary: Negative.   Musculoskeletal: Negative.   Neurological: Negative.   Psychiatric/Behavioral: Negative.       Objective:     BP 125/66   Pulse 67   Temp 98.2 F (36.8 C)   Ht  (1.803 m)   Wt 151 lb (68.5 kg)   SpO2 100%   BMI 21.06 kg/m  BP Readings from Last 3 Encounters:  12/26/21 125/66  12/09/21 118/61  08/17/21 129/66   Wt Readings from Last 3 Encounters:  12/26/21 151 lb (68.5 kg)  12/09/21 155 lb (70.3 kg)  08/15/21 157 lb 6.4 oz (71.4 kg)      Physical Exam Constitutional:      Appearance: Normal appearance.  HENT:     Nose: Nose normal.  Eyes:     Pupils: Pupils are equal, round, and reactive to light.  Cardiovascular:     Rate and Rhythm: Normal rate and regular rhythm.     Pulses: Normal pulses.  Pulmonary:     Effort: Pulmonary effort is normal.  Abdominal:     General: Bowel sounds are normal.  Musculoskeletal:        General: Normal range of motion.  Skin:    General: Skin is warm.  Neurological:     General: No focal deficit present.     Mental Status: She is alert. Mental status is at baseline.  Psychiatric:        Mood and Affect: Mood normal.        Thought Content: Thought content normal.        Judgment: Judgment normal.     No results found for any visits on 12/26/21.  Last CBC Lab Results  Component Value Date   WBC 8.6 12/09/2021   HGB 6.4 (LL) 12/09/2021   HCT 17.7 (L) 12/09/2021   MCV 88.9 12/09/2021   MCH 32.2 12/09/2021   RDW 25.8 (H)  12/09/2021   PLT 188 12/09/2021   Last metabolic panel Lab Results  Component Value Date   GLUCOSE 107 (H) 12/09/2021   NA 140 12/09/2021   K 3.8 12/09/2021  CL 110 12/09/2021   CO2 22 12/09/2021   BUN 7 (L) 12/09/2021   CREATININE 0.74 12/09/2021   GFRNONAA >60 12/09/2021   CALCIUM 9.4 12/09/2021   PHOS 3.8 11/27/2011   PROT 8.2 (H) 12/09/2021   ALBUMIN 4.0 12/09/2021   LABGLOB 3.2 08/15/2021   AGRATIO 1.3 08/15/2021   BILITOT 4.2 (H) 12/09/2021   ALKPHOS 73 12/09/2021   AST 68 (H) 12/09/2021   ALT 15 12/09/2021   ANIONGAP 8 12/09/2021   Last lipids Lab Results  Component Value Date   CHOL 133 05/11/2014   HDL 49 05/11/2014   LDLCALC 66 05/11/2014   TRIG 89 05/11/2014   CHOLHDL 2.7 05/11/2014   Last hemoglobin A1c No results found for: HGBA1C Last thyroid functions Lab Results  Component Value Date   TSH 147.044 (H) 11/10/2019   T4TOTAL 7.5 11/04/2017   Last vitamin D Lab Results  Component Value Date   VD25OH 20.8 (L) 08/15/2021   Last vitamin B12 and Folate Lab Results  Component Value Date   VITAMINB12 347 12/27/2020   FOLATE 15.1 12/27/2020      The ASCVD Risk score (Arnett DK, et al., 2019) failed to calculate for the following reasons:   Cannot find a previous HDL lab   Cannot find a previous total cholesterol lab    Assessment & Plan:   Problem List Items Addressed This Visit       Endocrine   Thyroid disease   Relevant Orders   Thyroid Panel With TSH     Other   Sickle cell anemia (HCC) - Primary   Relevant Medications   folic acid (FOLVITE) 1 MG tablet   Other Relevant Orders   Sickle Cell Panel   Other Visit Diagnoses     Vitamin D deficiency       Relevant Orders   Sickle Cell Panel   Other depression       Relevant Medications   DULoxetine (CYMBALTA) 30 MG capsule   Back muscle spasm       Relevant Medications   cyclobenzaprine (FLEXERIL) 10 MG tablet       Return in about 3 months (around 03/28/2022).     Julianne Handler, FNP

## 2021-12-26 NOTE — Patient Instructions (Signed)
The sickle cell center number is [phone]

## 2022-01-11 LAB — CMP14+CBC/D/PLT+FER+RETIC+V...
ALT: 12 IU/L (ref 0–32)
AST: 49 IU/L — ABNORMAL HIGH (ref 0–40)
Albumin/Globulin Ratio: 1.2 (ref 1.2–2.2)
Albumin: 4.3 g/dL (ref 3.8–4.8)
Alkaline Phosphatase: 94 IU/L (ref 44–121)
BUN/Creatinine Ratio: 6 — ABNORMAL LOW (ref 12–28)
BUN: 5 mg/dL — ABNORMAL LOW (ref 8–27)
Bilirubin Total: 4.5 mg/dL — ABNORMAL HIGH (ref 0.0–1.2)
CO2: 20 mmol/L (ref 20–29)
Calcium: 9.5 mg/dL (ref 8.7–10.3)
Chloride: 104 mmol/L (ref 96–106)
Creatinine, Ser: 0.77 mg/dL (ref 0.57–1.00)
Ferritin: 291 ng/mL — ABNORMAL HIGH (ref 15–150)
Globulin, Total: 3.7 g/dL (ref 1.5–4.5)
Glucose: 114 mg/dL — ABNORMAL HIGH (ref 70–99)
Potassium: 3.7 mmol/L (ref 3.5–5.2)
Sodium: 140 mmol/L (ref 134–144)
Total Protein: 8 g/dL (ref 6.0–8.5)
Vit D, 25-Hydroxy: 20 ng/mL — ABNORMAL LOW (ref 30.0–100.0)
eGFR: 88 mL/min/{1.73_m2} (ref >59)

## 2022-01-15 ENCOUNTER — Encounter: Payer: Self-pay | Admitting: Nurse Practitioner

## 2022-01-15 ENCOUNTER — Ambulatory Visit (INDEPENDENT_AMBULATORY_CARE_PROVIDER_SITE_OTHER): Payer: 59 | Admitting: Nurse Practitioner

## 2022-01-15 VITALS — BP 150/77 | HR 79 | Temp 97.8°F | Ht 71.0 in | Wt 149.2 lb

## 2022-01-15 DIAGNOSIS — M25531 Pain in right wrist: Secondary | ICD-10-CM | POA: Diagnosis not present

## 2022-01-15 MED ORDER — IBUPROFEN 800 MG PO TABS: 800.0000 mg | ORAL_TABLET | Freq: Three times a day (TID) | ORAL | 2 refills | Status: DC | PRN

## 2022-01-22 LAB — CMP14+CBC/D/PLT+FER+RETIC+V...
Basophils Absolute: 0.1 10*3/uL (ref 0.0–0.2)
Basos: 1 %
EOS (ABSOLUTE): 0.6 10*3/uL — ABNORMAL HIGH (ref 0.0–0.4)
Eos: 7 %
Hematocrit: 18.9 % — ABNORMAL LOW (ref 34.0–46.6)
Hemoglobin: 6.7 g/dL — CL (ref 11.1–15.9)
Immature Grans (Abs): 0.1 10*3/uL (ref 0.0–0.1)
Immature Granulocytes: 1 %
Lymphocytes Absolute: 3 10*3/uL (ref 0.7–3.1)
Lymphs: 33 %
MCH: 30.9 pg (ref 26.6–33.0)
MCHC: 35.4 g/dL (ref 31.5–35.7)
MCV: 87 fL (ref 79–97)
Monocytes Absolute: 0.9 10*3/uL (ref 0.1–0.9)
Monocytes: 10 %
NRBC: 21 % — ABNORMAL HIGH (ref 0–0)
Neutrophils Absolute: 4.4 10*3/uL (ref 1.4–7.0)
Neutrophils: 48 %
Platelets: 205 10*3/uL (ref 150–450)
RBC: 2.17 x10E6/uL — CL (ref 3.77–5.28)
RDW: 19 % — ABNORMAL HIGH (ref 11.7–15.4)
Retic Ct Pct: 19.2 % — ABNORMAL HIGH (ref 0.6–2.6)
WBC: 9.1 10*3/uL (ref 3.4–10.8)

## 2022-01-22 LAB — THYROID PANEL WITH TSH

## 2022-01-31 ENCOUNTER — Telehealth: Payer: Self-pay | Admitting: Clinical

## 2022-01-31 NOTE — Telephone Encounter (Signed)
[facility] General Follow Up Note  01/31/2022 Name: AZANA KIESLER MRN: [id number] DOB: 1960-12-16 [name] is a 61 y.o. year old female who sees Massie Maroon, FNP for primary care. LCSW was initially consulted to assist with housing concerns.  Interpreter: No.   Interpreter Name & Language: none  Assessment: Patient experiencing housing barriers.  Ongoing Intervention: CSW called patient on 6/8 and 12/29/21 to follow up on referral from PCP and was unable to reach patient, LVMs. Called patient again today, 01/31/22 and also LVM. CSW available from clinic to assist with housing resources as needed.  SDOH (Social Determinants of Health) assessments performed: No  Review of patient status, including review of consultants reports, relevant laboratory and other test results, and collaboration with appropriate care team members and the patient's provider was performed as part of comprehensive patient evaluation and provision of services.    Abigail Butts, LCSW [facility] [phone]

## 2022-02-26 ENCOUNTER — Telehealth: Payer: Self-pay

## 2022-02-26 NOTE — Telephone Encounter (Signed)
Pt came into office asking for a referral to Dr. Dionicia Abler in [place] for an infusion on your wrist.

## 2022-03-01 ENCOUNTER — Telehealth: Payer: Self-pay | Admitting: Clinical

## 2022-03-01 NOTE — Telephone Encounter (Addendum)
[facility]ated [facility]al [facility] General Follow Up Note  03/01/2022 Name: KAHLIYAH DICK MRN: [id number] DOB: 10-20-1960 [name] is a 61 y.o. year old female who sees Massie Maroon, FNP for primary care. LCSW was initially consulted to assist with housing concerns.  Interpreter: No.   Interpreter Name & Language: none  Assessment: Patient experiencing housing barriers.  Ongoing Intervention: Patient called CSW and reported that she was now being evicted from her apartment in a few days. She plans to go stay with her mother. CSW advised patient of housing that she may be eligible to apply for. Patient eligible for [employer] when she turns 61 later this year. Provided patient with information on [employer] and other low income housing in the area, as well as shelter information.  SDOH (Social Determinants of Health) assessments performed: No  Review of patient status, including review of consultants reports, relevant laboratory and other test results, and collaboration with appropriate care team members and the patient's provider was performed as part of comprehensive patient evaluation and provision of services.    Abigail Butts, LCSW [facility] [phone]

## 2022-03-11 ENCOUNTER — Other Ambulatory Visit: Payer: Self-pay

## 2022-03-11 ENCOUNTER — Emergency Department (HOSPITAL_COMMUNITY)
Admission: EM | Admit: 2022-03-11 | Discharge: 2022-03-11 | Disposition: A | Discharge status: Home / Self Care | Payer: Medicare Other | Attending: Emergency Medicine | Admitting: Emergency Medicine

## 2022-03-11 ENCOUNTER — Emergency Department (HOSPITAL_COMMUNITY): Payer: Medicare Other

## 2022-03-11 ENCOUNTER — Encounter (HOSPITAL_COMMUNITY): Payer: Self-pay

## 2022-03-11 DIAGNOSIS — E039 Hypothyroidism, unspecified: Secondary | ICD-10-CM | POA: Diagnosis not present

## 2022-03-11 DIAGNOSIS — M8588 Other specified disorders of bone density and structure, other site: Secondary | ICD-10-CM | POA: Diagnosis not present

## 2022-03-11 DIAGNOSIS — W19XXXA Unspecified fall, initial encounter: Secondary | ICD-10-CM | POA: Diagnosis not present

## 2022-03-11 DIAGNOSIS — M5459 Other low back pain: Secondary | ICD-10-CM | POA: Diagnosis not present

## 2022-03-11 DIAGNOSIS — Z79899 Other long term (current) drug therapy: Secondary | ICD-10-CM | POA: Diagnosis not present

## 2022-03-11 DIAGNOSIS — M545 Low back pain, unspecified: Secondary | ICD-10-CM

## 2022-03-11 DIAGNOSIS — M546 Pain in thoracic spine: Secondary | ICD-10-CM | POA: Insufficient documentation

## 2022-03-11 MED ORDER — IBUPROFEN 200 MG PO TABS
600.0000 mg | ORAL_TABLET | Freq: Once | ORAL | Status: AC
  Administered 2022-03-11: 600 mg via ORAL
  Filled 2022-03-11: qty 3

## 2022-03-11 NOTE — Discharge Instructions (Signed)
Please return to the ED with any new symptoms such as groin numbness, inability to control your bowels or your bladder, lower extremity weakness Please follow-up with your PCP this week for further management Please continue taking ibuprofen alternated with Tylenol for best analgesic effect Please read guide concerning acute back pain in adults

## 2022-03-11 NOTE — ED Triage Notes (Signed)
Pt states she tripped and fell on her buttocks on Friday. Endorses pain to lower back and left elbow as well.

## 2022-03-11 NOTE — ED Provider Notes (Signed)
[facility] [place] [facility]-EMERGENCY DEPT Provider Note   CSN: [id number] Arrival date & time: 03/11/22  2924     History  Chief Complaint  Patient presents with   Reggie Pile [name] is a 61 y.o. female with medical history significant for sickle cell anemia, hypothyroidism, arthritis.  Patient presents to ED for evaluation of tailbone pain.  Patient reports that on Friday she had a ground-level fall because when she tripped over her rug.  The patient states that she fell backwards landing on her bottom.  Patient denies striking her head, losing consciousness, being on blood thinners.  The patient states that since this time she has had steady pain around her low back.  The patient states that she has taken Tylenol with slight relief of symptoms however symptoms always return.  Patient denies red flag symptoms of low back pain.   Fall       Home Medications Prior to Admission medications   Medication Sig Start Date End Date Taking? Authorizing Provider  albuterol (VENTOLIN HFA) 108 (90 Base) MCG/ACT inhaler Inhale 1-2 puffs into the lungs every 6 (six) hours as needed for wheezing or shortness of breath. 02/29/20   [provider]  ascorbic acid (VITAMIN C) 500 MG tablet Take 500 mg by mouth daily.    [provider]  Calcium Carb-Cholecalciferol 600-20 MG-MCG CHEW Chew 1 tablet by mouth daily.    [provider]  cyclobenzaprine (FLEXERIL) 10 MG tablet Take 1 tablet (10 mg total) by mouth at bedtime. Ran out 12/26/21   Massie Maroon, FNP  DULoxetine (CYMBALTA) 30 MG capsule Take 1 capsule (30 mg total) by mouth daily. 12/26/21   Massie Maroon, FNP  folic acid (FOLVITE) 1 MG tablet Take 1 tablet (1 mg total) by mouth daily. 12/26/21   Massie Maroon, FNP  gabapentin (NEURONTIN) 300 MG capsule Take 300 mg by mouth daily.    [provider]  ibuprofen (ADVIL) 800 MG tablet Take 1 tablet (800 mg total) by mouth every 8 (eight) hours as  needed. 01/15/22   Ivonne Andrew, NP  levothyroxine (SYNTHROID) 175 MCG tablet Take 175 mcg by mouth daily before breakfast.    [provider]  montelukast (SINGULAIR) 10 MG tablet Take 10 mg by mouth daily.    [provider]  omega-3 acid ethyl esters (LOVAZA) 1 g capsule Take 1 g by mouth daily. 05/18/20   [provider]  VITAMIN D PO Take 1 capsule by mouth daily.    [provider]      Allergies    Ampicillin and Demerol [meperidine]    Review of Systems   Review of Systems  Musculoskeletal:  Positive for back pain and myalgias.  Neurological:  Negative for syncope.  All other systems reviewed and are negative.   Physical Exam Updated Vital Signs BP (!) 154/71   Pulse 81   Temp 98.8 F (37.1 C)   Resp 18   Ht 5\' 11"  (1.803 m)   Wt 65.8 kg   SpO2 95%   BMI 20.22 kg/m  Physical Exam Vitals and nursing note reviewed.  Constitutional:      General: She is not in acute distress.    Appearance: Normal appearance. She is not ill-appearing, toxic-appearing or diaphoretic.  HENT:     Head: Normocephalic and atraumatic.     Nose: Nose normal. No congestion.     Mouth/Throat:     Mouth: Mucous membranes are moist.  Pharynx: Oropharynx is clear.  Eyes:     Extraocular Movements: Extraocular movements intact.     Conjunctiva/sclera: Conjunctivae normal.     Pupils: Pupils are equal, round, and reactive to light.  Cardiovascular:     Rate and Rhythm: Normal rate and regular rhythm.  Pulmonary:     Effort: Pulmonary effort is normal.     Breath sounds: Normal breath sounds. No wheezing.  Abdominal:     General: Abdomen is flat. Bowel sounds are normal.     Palpations: Abdomen is soft.     Tenderness: There is no abdominal tenderness.  Musculoskeletal:     Cervical back: Normal range of motion and neck supple. No tenderness.     Lumbar back: Tenderness present. No swelling. Normal range of motion.     Comments: Patient with  nonfocal tenderness throughout her low back.  Lumbar spine palpated without findings of deformity, crepitus, step-off.  Patient does have subjective tenderness around her tailbone as well however there is no overlying skin change.  The patient has full range of motion of her lumbar spine.  Patient able to flex, extend without issue.  Skin:    General: Skin is warm and dry.     Capillary Refill: Capillary refill takes less than 2 seconds.  Neurological:     General: No focal deficit present.     Mental Status: She is alert and oriented to person, place, and time.     GCS: GCS eye subscore is 4. GCS verbal subscore is 5. GCS motor subscore is 6.     Cranial Nerves: Cranial nerves 2-12 are intact. No cranial nerve deficit.     Sensory: Sensation is intact. No sensory deficit.     Motor: Motor function is intact. No weakness.     Coordination: Coordination is intact. Heel to [facility] Test normal.     ED Results / Procedures / Treatments   Labs (all labs ordered are listed, but only abnormal results are displayed) Labs Reviewed - No data to display  EKG None  Radiology DG Lumbar Spine Complete  Result Date: 03/11/2022 CLINICAL DATA:  Pain after fall EXAM: LUMBAR SPINE - COMPLETE 4 VIEW COMPARISON:  Lumbar spine CT 12/01/2019 FINDINGS: No evidence of acute fracture or abnormal alignment. L4-5 and L5-S1 disc space narrowing and endplate spurring. Subjective osteopenia. IMPRESSION: 1. No acute or focal finding. 2. 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 Electronically Signed   By: Tiburcio Pea M.D.   On: 03/11/2022 08:32    Procedures Procedures   Medications Ordered in ED Medications  ibuprofen (ADVIL) tablet 600 mg (600 mg Oral Given 03/11/22 4627)    ED Course/ Medical Decision Making/ A&P                           Medical Decision Making Amount and/or Complexity of Data Reviewed Radiology: ordered.  Risk OTC drugs.   61 year old female presents to ED for evaluation of low back  pain.  Please see HPI for further details.  On examination the patient has full range of motion of her lumbar spine, able to flex and extend without issue.  There is no overlying skin change around her tailbone, her low back.  The patient lumbar spine was palpated without any findings of deformity, crepitus or step-off.  Patient denies any red flag symptoms low back pain.  Plain film imaging of patient lumbar spine does not show any acute abnormalities, fractures.  I agree with  radiologist interpretation.  Patient provided with 600 mg ibuprofen, reassessed and states that her low back pain has decreased.  The patient will be discharged home and advised to follow-up with her PCP.  Patient provided with return precautions and she voiced understanding.  Patient had all of her questions answered to her satisfaction prior to discharge.  The patient is stable at this time for discharge home.  Final Clinical Impression(s) / ED Diagnoses Final diagnoses:  Acute midline low back pain without sciatica    Rx / DC Orders ED Discharge Orders     None         Al Decant, PA-C 03/11/22 0840    Derwood Kaplan, MD 03/12/22 2228

## 2022-03-16 DIAGNOSIS — K219 Gastro-esophageal reflux disease without esophagitis: Secondary | ICD-10-CM | POA: Diagnosis not present

## 2022-03-16 DIAGNOSIS — I119 Hypertensive heart disease without heart failure: Secondary | ICD-10-CM | POA: Diagnosis not present

## 2022-03-16 DIAGNOSIS — Z0001 Encounter for general adult medical examination with abnormal findings: Secondary | ICD-10-CM | POA: Diagnosis not present

## 2022-03-16 DIAGNOSIS — Z79899 Other long term (current) drug therapy: Secondary | ICD-10-CM | POA: Diagnosis not present

## 2022-03-16 DIAGNOSIS — J849 Interstitial pulmonary disease, unspecified: Secondary | ICD-10-CM | POA: Diagnosis not present

## 2022-03-16 DIAGNOSIS — M0579 Rheumatoid arthritis with rheumatoid factor of multiple sites without organ or systems involvement: Secondary | ICD-10-CM | POA: Diagnosis not present

## 2022-03-16 DIAGNOSIS — J449 Chronic obstructive pulmonary disease, unspecified: Secondary | ICD-10-CM | POA: Diagnosis not present

## 2022-03-17 ENCOUNTER — Other Ambulatory Visit: Payer: Self-pay

## 2022-03-17 ENCOUNTER — Emergency Department (HOSPITAL_COMMUNITY)
Admission: EM | Admit: 2022-03-17 | Discharge: 2022-03-17 | Disposition: A | Discharge status: Home / Self Care | Payer: Medicare Other | Source: Home / Self Care | Attending: Emergency Medicine | Admitting: Emergency Medicine

## 2022-03-17 ENCOUNTER — Encounter (HOSPITAL_COMMUNITY): Payer: Self-pay

## 2022-03-17 ENCOUNTER — Emergency Department (HOSPITAL_COMMUNITY)
Admission: EM | Admit: 2022-03-17 | Discharge: 2022-03-17 | Discharge status: Against Medical Advice | Payer: Medicare Other | Attending: Emergency Medicine | Admitting: Emergency Medicine

## 2022-03-17 DIAGNOSIS — D649 Anemia, unspecified: Secondary | ICD-10-CM

## 2022-03-17 DIAGNOSIS — R531 Weakness: Secondary | ICD-10-CM | POA: Insufficient documentation

## 2022-03-17 DIAGNOSIS — R5383 Other fatigue: Secondary | ICD-10-CM | POA: Insufficient documentation

## 2022-03-17 DIAGNOSIS — Z5321 Procedure and treatment not carried out due to patient leaving prior to being seen by health care provider: Secondary | ICD-10-CM | POA: Diagnosis not present

## 2022-03-17 DIAGNOSIS — R799 Abnormal finding of blood chemistry, unspecified: Secondary | ICD-10-CM | POA: Insufficient documentation

## 2022-03-17 DIAGNOSIS — E039 Hypothyroidism, unspecified: Secondary | ICD-10-CM | POA: Insufficient documentation

## 2022-03-17 DIAGNOSIS — E876 Hypokalemia: Secondary | ICD-10-CM | POA: Diagnosis not present

## 2022-03-17 LAB — TYPE AND SCREEN
ABO/RH(D): B POS
Antibody Screen: NEGATIVE
Unit division: 0

## 2022-03-17 LAB — COMPREHENSIVE METABOLIC PANEL
ALT: 15 U/L (ref 0–44)
AST: 47 U/L — ABNORMAL HIGH (ref 15–41)
Albumin: 3.8 g/dL (ref 3.5–5.0)
Alkaline Phosphatase: 69 U/L (ref 38–126)
Anion gap: 8 (ref 5–15)
BUN: 6 mg/dL — ABNORMAL LOW (ref 8–23)
CO2: 23 mmol/L (ref 22–32)
Calcium: 9.4 mg/dL (ref 8.9–10.3)
Chloride: 107 mmol/L (ref 98–111)
Creatinine, Ser: 0.57 mg/dL (ref 0.44–1.00)
GFR, Estimated: >60 mL/min (ref >60)
Glucose, Bld: 104 mg/dL — ABNORMAL HIGH (ref 70–99)
Potassium: 3 mmol/L — ABNORMAL LOW (ref 3.5–5.1)
Sodium: 138 mmol/L (ref 135–145)
Total Bilirubin: 4.4 mg/dL — ABNORMAL HIGH (ref 0.3–1.2)
Total Protein: 7.7 g/dL (ref 6.5–8.1)

## 2022-03-17 LAB — CBC WITH DIFFERENTIAL/PLATELET
Abs Immature Granulocytes: 0.04 10*3/uL (ref 0.00–0.07)
Basophils Absolute: 0 10*3/uL (ref 0.0–0.1)
Basophils Relative: 0 %
Eosinophils Absolute: 0.8 10*3/uL — ABNORMAL HIGH (ref 0.0–0.5)
Eosinophils Relative: 8 %
HCT: 15.9 % — ABNORMAL LOW (ref 36.0–46.0)
Hemoglobin: 5.8 g/dL — CL (ref 12.0–15.0)
Immature Granulocytes: 0 %
Lymphocytes Relative: 39 %
Lymphs Abs: 3.9 10*3/uL (ref 0.7–4.0)
MCH: 31.2 pg (ref 26.0–34.0)
MCHC: 36.5 g/dL — ABNORMAL HIGH (ref 30.0–36.0)
MCV: 85.5 fL (ref 80.0–100.0)
Monocytes Absolute: 1.1 10*3/uL — ABNORMAL HIGH (ref 0.1–1.0)
Monocytes Relative: 11 %
Neutro Abs: 4.2 10*3/uL (ref 1.7–7.7)
Neutrophils Relative %: 42 %
Platelets: 215 10*3/uL (ref 150–400)
RBC: 1.86 MIL/uL — ABNORMAL LOW (ref 3.87–5.11)
RDW: 24.1 % — ABNORMAL HIGH (ref 11.5–15.5)
WBC: 10 10*3/uL (ref 4.0–10.5)
nRBC: 17.3 % — ABNORMAL HIGH (ref 0.0–0.2)

## 2022-03-17 LAB — BPAM RBC
Blood Product Expiration Date: 202309152359
Unit Type and Rh: 5100

## 2022-03-17 LAB — PREPARE RBC (CROSSMATCH)

## 2022-03-17 MED ORDER — SODIUM CHLORIDE 0.9 % IV SOLN
10.0000 mL/h | Freq: Once | INTRAVENOUS | Status: AC
  Administered 2022-03-17: 10 mL/h via INTRAVENOUS

## 2022-03-17 MED ORDER — POTASSIUM CHLORIDE CRYS ER 20 MEQ PO TBCR
40.0000 meq | EXTENDED_RELEASE_TABLET | Freq: Once | ORAL | Status: AC
Start: 2022-03-17 — End: 2022-03-17
  Administered 2022-03-17: 40 meq via ORAL
  Filled 2022-03-17: qty 2

## 2022-03-17 MED ORDER — DIPHENHYDRAMINE HCL 50 MG/ML IJ SOLN
25.0000 mg | Freq: Once | INTRAMUSCULAR | Status: AC
  Administered 2022-03-17: 25 mg via INTRAVENOUS
  Filled 2022-03-17: qty 1

## 2022-03-17 NOTE — ED Notes (Signed)
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 Provider advised to stop transfusion , give benadryl and address after intervention completed. Transfusion stopped at 1136 am.

## 2022-03-17 NOTE — ED Notes (Signed)
Patient denies itching at this time. Nurse notified provider and was given consent to restart transfusion at this time.

## 2022-03-17 NOTE — ED Provider Notes (Signed)
[facility]-EMERGENCY DEPT Provider Note   CSN: [id number] Arrival date & time: 03/17/22  0753     History  Chief Complaint  Patient presents with   Abnormal Lab    [name] is a 61 y.o. female.  Patient is a 61 year old female with a past medical history of sickle cell anemia, rheumatoid arthritis, and hypothyroidism presenting to the emergency department with fatigue and anemia on outpatient labs.  The patient states that she had regular lab work done by her primary doctor and was called last evening telling her that her hemoglobin was low and that she needed a blood transfusion.  The patient states that she has had increased generalized weakness and fatigue over the last several days.  She denies any chest pain or shortness of breath.  She denies any black or bloody stools.  She denies any recent nausea or vomiting.  He states that she does receive frequent blood transfusions for her sickle cell anemia.  The history is provided by the patient.  Abnormal Lab      Home Medications Prior to Admission medications   Medication Sig Start Date End Date Taking? Authorizing Provider  albuterol (VENTOLIN HFA) 108 (90 Base) MCG/ACT inhaler Inhale 1-2 puffs into the lungs every 6 (six) hours as needed for wheezing or shortness of breath. 02/29/20   [provider]  ascorbic acid (VITAMIN C) 500 MG tablet Take 500 mg by mouth daily.    [provider]  Calcium Carb-Cholecalciferol 600-20 MG-MCG CHEW Chew 1 tablet by mouth daily.    [provider]  cyclobenzaprine (FLEXERIL) 10 MG tablet Take 1 tablet (10 mg total) by mouth at bedtime. Ran out 12/26/21   Massie Maroon, FNP  DULoxetine (CYMBALTA) 30 MG capsule Take 1 capsule (30 mg total) by mouth daily. 12/26/21   Massie Maroon, FNP  folic acid (FOLVITE) 1 MG tablet Take 1 tablet (1 mg total) by mouth daily. 12/26/21   Massie Maroon, FNP  gabapentin (NEURONTIN) 300 MG capsule Take 300 mg  by mouth daily.    [provider]  ibuprofen (ADVIL) 800 MG tablet Take 1 tablet (800 mg total) by mouth every 8 (eight) hours as needed. 01/15/22   Ivonne Andrew, NP  levothyroxine (SYNTHROID) 175 MCG tablet Take 175 mcg by mouth daily before breakfast.    [provider]  montelukast (SINGULAIR) 10 MG tablet Take 10 mg by mouth daily.    [provider]  omega-3 acid ethyl esters (LOVAZA) 1 g capsule Take 1 g by mouth daily. 05/18/20   [provider]  VITAMIN D PO Take 1 capsule by mouth daily.    [provider]      Allergies    Ampicillin, Demerol [meperidine], and Meperidine hcl    Review of Systems   Review of Systems  Physical Exam Updated Vital Signs BP 130/75 (BP Location: Left Arm)   Pulse 88   Temp 98.7 F (37.1 C) (Oral)   Resp 18   Ht 5\' 11"  (1.803 m)   Wt 60.8 kg   SpO2 91%   BMI 18.69 kg/m  Physical Exam Vitals and nursing note reviewed.  Constitutional:      General: She is not in acute distress.    Appearance: Normal appearance.  HENT:     Head: Normocephalic and atraumatic.     Nose: Nose normal.     Mouth/Throat:     Mouth: Mucous membranes are moist.  Pharynx: Oropharynx is clear.  Eyes:     Extraocular Movements: Extraocular movements intact.     Conjunctiva/sclera: Conjunctivae normal.  Cardiovascular:     Rate and Rhythm: Normal rate and regular rhythm.     Heart sounds: Normal heart sounds.  Pulmonary:     Effort: Pulmonary effort is normal.     Breath sounds: Normal breath sounds.  Abdominal:     General: Abdomen is flat.     Palpations: Abdomen is soft.     Tenderness: There is no abdominal tenderness.  Musculoskeletal:        General: Normal range of motion.     Cervical back: Normal range of motion and neck supple.  Skin:    General: Skin is warm and dry.  Neurological:     General: No focal deficit present.     Mental Status: She is alert and oriented to person, place, and time.   Psychiatric:        Mood and Affect: Mood normal.        Behavior: Behavior normal.     ED Results / Procedures / Treatments   Labs (all labs ordered are listed, but only abnormal results are displayed) Labs Reviewed  PREPARE RBC (CROSSMATCH)    EKG None  Radiology No results found.  Procedures Procedures    Medications Ordered in ED Medications  0.9 %  sodium chloride infusion (has no administration in time range)    ED Course/ Medical Decision Making/ A&P Clinical Course as of 03/17/22 1516  Sat Mar 17, 2022  1148 Patient's nurse reported to me that shortly after starting her transfusion she started to develop itching.  Patient denies any urticaria or shortness of breath.  She states that she has had itching with reactions before.  Her transfusion was paused and she will be given Benadryl.  She will have her blood reconfirmed and if improvement of her symptoms, the transfusion will be reinitiated. [VK]  [id number] Patient was able to complete her blood transfusion without any complications.  She is stable for discharge home and was recommended to follow-up with her hematologist. [VK]    Clinical Course User Index [VK] Ottie Glazier, DO                           Medical Decision Making This patient presents to the ED with chief complaint(s) of anemia, fatigue with pertinent past medical history of sickle cell Anemia Which further complicates the presenting complaint. The complaint involves an extensive differential diagnosis and also carries with it a high risk of complications and morbidity.    The differential diagnosis includes anemia, no symptoms of acute GI bleeding or acute blood loss anemia, no focal neurologic deficits making CVA unlikely  Additional history obtained: Records reviewed Primary Care Documents and previous ED record  ED Course and Reassessment: Initially evaluated to triage last night and had labs performed in triage that showed a hemoglobin of  5.8 her baseline is between 6-7.  The patient did leave prior to being seen and returned this morning after seeing her blood results.  Remainder of patient's labs are within normal range.  She will require blood transfusion.  She was consented by myself.  The patient has good hematology follow-up with the sickle cell clinic and will likely be able to be discharged after transfusion if she has no other complaints and no other source of blood loss.  Patient's labs also showed hypokalemia with  a potassium of 3.0 and she will be repleted with oral potassium.  Independent labs interpretation:  The following labs were independently interpreted: Anemia with a hemoglobin of 5.8, hypokalemia  Independent visualization of imaging: N/A  Consultation: - Consulted or discussed management/test interpretation w/ external professional: N/A  Consideration for admission or further workup: Patient has a history of chronic anemia and good hematology follow-up and does not require admission for her anemia today. Social Determinants of health: N/A   Amount and/or Complexity of Data Reviewed Labs: ordered.  Risk Prescription drug management.          Final Clinical Impression(s) / ED Diagnoses Final diagnoses:  None    Rx / DC Orders ED Discharge Orders     None         Phoebe Sharps, DO 03/17/22 1517

## 2022-03-17 NOTE — ED Triage Notes (Signed)
[address] called informing ot of low blood counts.   Pt sts chronic low hgb r/t her sickle cell. Has been fatigued and more tired than normal.

## 2022-03-17 NOTE — Discharge Instructions (Signed)
You were seen in the emergency department for your weakness and fatigue.  Your hemoglobin was low here at 5.8 when you are normally between 6-7.  We gave you a blood transfusion in the emergency department you tolerated well.  You should follow-up with your hematologist within the next week to have your symptoms rechecked.  You should return to the emergency department if you develop chest pain, shortness of breath, you pass out, you have black or bloody stools, or if you have any other new or concerning symptoms.

## 2022-03-17 NOTE — ED Triage Notes (Signed)
Pt presents with c/o low hemoglobin after getting blood work done. Pt was here earlier, left because of wait time. Pt asymptomatic at this time.

## 2022-03-17 NOTE — ED Notes (Signed)
Patient ambulated to bathroom at this time.

## 2022-03-17 NOTE — ED Notes (Signed)
Transfusion complete. Patient reports no adverse effects. Provider Kneller notified transfusion complete.

## 2022-03-17 NOTE — ED Notes (Signed)
At this time patient reports "a little itching. Not much" patient also states she has itching with every transfusion she has ever had. No signs of distress at this time.

## 2022-03-18 LAB — TYPE AND SCREEN
ABO/RH(D): B POS
Antibody Screen: NEGATIVE
Unit division: 0

## 2022-03-18 LAB — BPAM RBC
Blood Product Expiration Date: 202309152359
ISSUE DATE / TIME: 202308261100
Unit Type and Rh: 5100

## 2022-03-20 DIAGNOSIS — M79662 Pain in left lower leg: Secondary | ICD-10-CM | POA: Diagnosis not present

## 2022-03-20 DIAGNOSIS — M79661 Pain in right lower leg: Secondary | ICD-10-CM | POA: Diagnosis not present

## 2022-03-27 ENCOUNTER — Encounter: Payer: Self-pay | Admitting: Family Medicine

## 2022-03-27 ENCOUNTER — Telehealth: Payer: Self-pay | Admitting: Clinical

## 2022-03-27 ENCOUNTER — Ambulatory Visit (INDEPENDENT_AMBULATORY_CARE_PROVIDER_SITE_OTHER): Payer: Medicare HMO | Admitting: Family Medicine

## 2022-03-27 VITALS — BP 127/74 | HR 77 | Temp 97.1°F | Wt 141.2 lb

## 2022-03-27 DIAGNOSIS — M069 Rheumatoid arthritis, unspecified: Secondary | ICD-10-CM

## 2022-03-27 DIAGNOSIS — E079 Disorder of thyroid, unspecified: Secondary | ICD-10-CM

## 2022-03-27 DIAGNOSIS — L299 Pruritus, unspecified: Secondary | ICD-10-CM

## 2022-03-27 DIAGNOSIS — E559 Vitamin D deficiency, unspecified: Secondary | ICD-10-CM

## 2022-03-27 DIAGNOSIS — D571 Sickle-cell disease without crisis: Secondary | ICD-10-CM

## 2022-03-27 DIAGNOSIS — Z5900 Homelessness unspecified: Secondary | ICD-10-CM

## 2022-03-27 MED ORDER — HYDROXYZINE HCL 10 MG PO TABS: 10.0000 mg | ORAL_TABLET | Freq: Three times a day (TID) | ORAL | 0 refills | Status: DC | PRN

## 2022-03-27 NOTE — Telephone Encounter (Signed)
[facility]al [facility] General Follow Up Note  03/27/2022 Name: [name] MRN: [id number] DOB: [birth year] [name] is a 61 y.o. year old female who sees Massie Maroon, FNP for primary care. LCSW was initially consulted to assist with housing concerns.  Interpreter: No.   Interpreter Name & Language: none  Assessment: Patient experiencing housing barriers.  Ongoing Intervention: Patient was evicted from her apartment recently. She is residing in her vehicle with her cousin currently. CSW advised patient of the [facility]) and discussed referral to [facility] Patient indicated she is already familiar with the [facility] and has been going there for services since leaving her apartment. She has also had an assessment done with [facility] already and is on their housing wait list. CSW consulted with social worker at [employer] and [facility]), as patient has previously worked with them. [facility] social worker advised of an apartment with low rent that the patient may want to apply for. CSW advised patient of this. Patient has social security income and has been applying for apartments.   Review of patient status, including review of consultants reports, relevant laboratory and other test results, and collaboration with appropriate care team members and the patient's provider was performed as part of comprehensive patient evaluation and provision of services.    Abigail Butts, LCSW Patient [facility] [phone]

## 2022-03-27 NOTE — Progress Notes (Signed)
[facility]  Established Patient Office Visit  Subjective   Patient ID: [name], female    DOB: [birth year]  Age: 61 y.o. MRN: [id number]  Chief Complaint  Patient presents with   Follow-up    Pt is here for 3 month SCD follow up visit. Pt states the blood transfusion has been causing her to itch all the time Pt is requesting a referral to see the infusion doctor     HPI [name] is a 61 year old female with a medical history significant for sickle cell disease, chronic pain syndrome, history of rheumatoid arthritis, history of hypothyroidism, and history of anemia of chronic disease presents with complaints of bilateral wrist pain over the past several months.  Patient reports a history of rheumatoid arthritis and states that symptoms have been worsening.  She was previously followed by a rheumatologist in [facility] but has been lost to follow-up over the past several years.  Patient states "I need my injections".  She is unsure of the injections that she was receiving several years ago. The patient also has a history of sickle cell disease.  Sickle cell has been mostly well controlled with infrequent pain crisis.  Patient does have a history of symptomatic anemia and received a blood transfusion on 03/17/2022 for hemoglobin of 5.8 g/dL.  Patient's baseline is 6.7 g/dL.  She endorses fatigue at this time.  She is not on any maintenance medications for sickle cell disease.  Also, she is not followed by hematologist.  Patient denies headache, blurred vision, urinary symptoms, nausea, vomiting, or diarrhea. 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   Thrombophlebitis leg superficial 03/04/2013   Rheumatoid arthritis (HCC) 12/16/2012   Thyroid disease 11/27/2011   Past Medical History:  Diagnosis Date   Aneurysm (HCC)    Anxiety    Arthritis    Colon ulcer    Gall stones    Hypokalemia    Hypothyroidism    Pneumonia    Sickle cell anemia (HCC)    Ulcers of both lower extremities (HCC)    Past Surgical History:  Procedure Laterality Date   ANKLE SURGERY  1989 and 1990   CEREBRAL ANEURYSM REPAIR     COLONOSCOPY  05/12/2012   Procedure: COLONOSCOPY;  Surgeon: Hart Carwin, MD;  Location: [facility];  Service: Endoscopy;  Laterality: N/A;   ESOPHAGOGASTRODUODENOSCOPY  05/12/2012   Procedure: ESOPHAGOGASTRODUODENOSCOPY (EGD);  Surgeon: Hart Carwin, MD;  Location: [facility];  Service: Endoscopy;  Laterality: N/A;   gall stone removal     GIVENS CAPSULE STUDY  05/13/2012   Procedure: GIVENS CAPSULE STUDY;  Surgeon: Hart Carwin, MD;  Location: [facility];  Service: Endoscopy;  Laterality: N/A;   Social History   Tobacco Use   Smoking status: Never   Smokeless tobacco: Never  Vaping Use   Vaping Use: Never used  Substance Use Topics   Alcohol use: Yes    Comment: Occasional   Drug use: No  Social History   Socioeconomic History   Marital status: Single    Spouse name: Not on file   Number of children: Not on file   Years of education: Not on file   Highest education level: Not on file  Occupational History   Not on file  Tobacco Use   Smoking status: Never   Smokeless tobacco: Never  Vaping Use   Vaping Use: Never used  Substance and Sexual Activity   Alcohol use: Yes    Comment: Occasional   Drug use: No   Sexual activity: Not Currently  Other Topics Concern   Not on file  Social History Narrative   Not on file   Social Determinants of Health   Financial  Resource Strain: Not on file  Food Insecurity: Not on file  Transportation Needs: Not on file  Physical Activity: Not on file  Stress: Not on file  Social Connections: Not on file  Intimate Partner Violence: Not on file   Family Status  Relation Name Status   Mother  Alive   Father  Deceased   Sister  Alive   Brother  Alive   Sister  Alive   Neg Hx  (Not Specified)   Family History  Problem Relation Age of Onset   Cancer Mother 12       Esophageal   Sickle cell trait Mother    Sickle cell trait Father    HIV/AIDS Brother    Colon cancer Neg Hx    Allergies  Allergen Reactions   Ampicillin Other (See Comments)    Caused red bumps on the skin, but nothing else Per patient, she tolerates cephalosporins without problems (??)   Demerol [Meperidine] Other (See Comments)    A high(er) dose caused seizure(s)      Review of Systems  Constitutional: Negative.   HENT: Negative.    Eyes: Negative.   Respiratory: Negative.    Cardiovascular: Negative.   Gastrointestinal: Negative.   Genitourinary: Negative.   Musculoskeletal:  Positive for back pain.  Skin: Negative.   Neurological: Negative.   Psychiatric/Behavioral: Negative.        Objective:     BP 127/74 (BP Location: Left Arm, Patient Position: Sitting, Cuff Size: Normal)   Pulse 77   Temp (!) 97.1 F (36.2 C)   Wt 141 lb 3.2 oz (64 kg)   SpO2 100%   BMI 19.69 kg/m  BP Readings from Last 3 Encounters:  03/27/22 127/74  03/17/22 121/88  03/17/22 (!) 137/93   Wt Readings from Last 3 Encounters:  03/27/22 141 lb 3.2 oz (64 kg)  03/17/22 134 lb (60.8 kg)  03/17/22 134 lb (60.8 kg)    Physical Exam Constitutional:      Appearance: Normal appearance.  Eyes:     Pupils: Pupils are equal, round, and reactive to light.  Cardiovascular:     Rate and Rhythm: Normal rate and regular rhythm.     Pulses: Normal pulses.  Pulmonary:     Effort: Pulmonary effort is normal.  Abdominal:     General: Bowel  sounds are normal.  Musculoskeletal:     Right wrist: Swelling and tenderness present. No bony tenderness. Decreased range of motion. Normal pulse.     Left wrist: Swelling and tenderness present. No bony tenderness. Decreased range of motion. Normal pulse.  Skin:    General: Skin is warm.  Neurological:     General: No focal deficit present.     Mental Status: She is alert.  Mental status is at baseline.  Psychiatric:        Mood and Affect: Mood normal.        Thought Content: Thought content normal.        Judgment: Judgment normal.      No results found for any visits on 03/27/22.  Last CBC Lab Results  Component Value Date   WBC 10.0 03/17/2022   HGB 5.8 (LL) 03/17/2022   HCT 15.9 (L) 03/17/2022   MCV 85.5 03/17/2022   MCH 31.2 03/17/2022   RDW 24.1 (H) 03/17/2022   PLT 215 03/17/2022   Last metabolic panel Lab Results  Component Value Date   GLUCOSE 104 (H) 03/17/2022   NA 138 03/17/2022   K 3.0 (L) 03/17/2022   CL 107 03/17/2022   CO2 23 03/17/2022   BUN 6 (L) 03/17/2022   CREATININE 0.57 03/17/2022   GFRNONAA >60 03/17/2022   CALCIUM 9.4 03/17/2022   PHOS 3.8 11/27/2011   PROT 7.7 03/17/2022   ALBUMIN 3.8 03/17/2022   LABGLOB 3.7 12/26/2021   AGRATIO 1.2 12/26/2021   BILITOT 4.4 (H) 03/17/2022   ALKPHOS 69 03/17/2022   AST 47 (H) 03/17/2022   ALT 15 03/17/2022   ANIONGAP 8 03/17/2022   Last lipids Lab Results  Component Value Date   CHOL 133 05/11/2014   HDL 49 05/11/2014   LDLCALC 66 05/11/2014   TRIG 89 05/11/2014   CHOLHDL 2.7 05/11/2014   Last hemoglobin A1c No results found for: "HGBA1C" Last thyroid functions Lab Results  Component Value Date   TSH CANCELED 12/26/2021   T4TOTAL CANCELED 12/26/2021   Last vitamin D Lab Results  Component Value Date   VD25OH 20.0 (L) 12/26/2021   Last vitamin B12 and Folate Lab Results  Component Value Date   VITAMINB12 347 12/27/2020   FOLATE 15.1 12/27/2020      The ASCVD Risk score  (Arnett DK, et al., 2019) failed to calculate for the following reasons:   Cannot find a previous HDL lab   Cannot find a previous total cholesterol lab    Assessment & Plan:   Problem List Items Addressed This Visit       Endocrine   Thyroid disease   Relevant Orders   Thyroid Panel With TSH     Musculoskeletal and Integument   Rheumatoid arthritis (HCC)   Relevant Orders   Ambulatory referral to Rheumatology     Other   Sickle cell anemia (HCC) - Primary   Relevant Orders   Sickle Cell Panel   Other Visit Diagnoses     Vitamin D deficiency       Relevant Orders   Sickle Cell Panel   Pruritus       Relevant Medications   hydrOXYzine (ATARAX) 10 MG tablet   Homelessness         1. Sickle cell disease without crisis (HCC)  - Sickle Cell Panel  2. Vitamin D deficiency  - Sickle Cell Panel  3. Thyroid disease  - Thyroid Panel With TSH  4. Rheumatoid arthritis involving multiple sites, unspecified whether rheumatoid factor present [facility])  - Ambulatory referral to Rheumatology  5. Pruritus - hydrOXYzine (ATARAX) 10 MG tablet; Take 1 tablet (10 mg total) by mouth 3 (three) times daily as needed.  Dispense: 30 tablet; Refill: 0  6. Homelessness Patient says that she has been living in her car over the past several months in front of [facility].  Patient connected with Abigail Butts, LCSW to assist with  housing needs.  Return in about 3 months (around 06/26/2022) for sickle cell anemia.   Nolon Nations  APRN, MSN, FNP-C [facility] [address] [place], [place] [address] [phone]

## 2022-03-28 LAB — CMP14+CBC/D/PLT+FER+RETIC+V...
ALT: 11 IU/L (ref 0–32)
AST: 45 IU/L — ABNORMAL HIGH (ref 0–40)
Albumin/Globulin Ratio: 1.2 (ref 1.2–2.2)
Albumin: 4.1 g/dL (ref 3.9–4.9)
Alkaline Phosphatase: 102 IU/L (ref 44–121)
BUN/Creatinine Ratio: 4 — ABNORMAL LOW (ref 12–28)
BUN: 3 mg/dL — ABNORMAL LOW (ref 8–27)
Basophils Absolute: 0.1 x10E3/uL (ref 0.0–0.2)
Basos: 1 %
Bilirubin Total: 4.3 mg/dL — ABNORMAL HIGH (ref 0.0–1.2)
CO2: 20 mmol/L (ref 20–29)
Calcium: 9.5 mg/dL (ref 8.7–10.3)
Chloride: 104 mmol/L (ref 96–106)
Creatinine, Ser: 0.7 mg/dL (ref 0.57–1.00)
EOS (ABSOLUTE): 0.6 x10E3/uL — ABNORMAL HIGH (ref 0.0–0.4)
Eos: 6 %
Ferritin: 313 ng/mL — ABNORMAL HIGH (ref 15–150)
Globulin, Total: 3.4 g/dL (ref 1.5–4.5)
Glucose: 124 mg/dL — ABNORMAL HIGH (ref 70–99)
Hematocrit: 18.3 % — ABNORMAL LOW (ref 34.0–46.6)
Hemoglobin: 6.5 g/dL — CL (ref 11.1–15.9)
Immature Grans (Abs): 0.1 x10E3/uL (ref 0.0–0.1)
Immature Granulocytes: 1 %
Lymphocytes Absolute: 2.6 x10E3/uL (ref 0.7–3.1)
Lymphs: 26 %
MCH: 30.5 pg (ref 26.6–33.0)
MCHC: 35.5 g/dL (ref 31.5–35.7)
MCV: 86 fL (ref 79–97)
Monocytes Absolute: 1.1 x10E3/uL — ABNORMAL HIGH (ref 0.1–0.9)
Monocytes: 11 %
NRBC: 12 % — ABNORMAL HIGH (ref 0–0)
Neutrophils Absolute: 5.4 x10E3/uL (ref 1.4–7.0)
Neutrophils: 55 %
Platelets: 245 x10E3/uL (ref 150–450)
Potassium: 3.8 mmol/L (ref 3.5–5.2)
RBC: 2.13 x10E6/uL — CL (ref 3.77–5.28)
RDW: 19.6 % — ABNORMAL HIGH (ref 11.7–15.4)
Retic Ct Pct: 17.4 % — ABNORMAL HIGH (ref 0.6–2.6)
Sodium: 140 mmol/L (ref 134–144)
Total Protein: 7.5 g/dL (ref 6.0–8.5)
Vit D, 25-Hydroxy: 26.6 ng/mL — ABNORMAL LOW (ref 30.0–100.0)
WBC: 9.9 x10E3/uL (ref 3.4–10.8)
eGFR: 98 mL/min/1.73 (ref >59)

## 2022-03-28 LAB — THYROID PANEL WITH TSH
Free Thyroxine Index: 0.8 — ABNORMAL LOW (ref 1.2–4.9)
T3 Uptake Ratio: 20 % — ABNORMAL LOW (ref 24–39)
T4, Total: 4.1 ug/dL — ABNORMAL LOW (ref 4.5–12.0)
TSH: 36.2 u[IU]/mL — ABNORMAL HIGH (ref 0.450–4.500)

## 2022-03-29 ENCOUNTER — Other Ambulatory Visit: Payer: Self-pay | Admitting: Family Medicine

## 2022-03-29 NOTE — Progress Notes (Signed)
[name] is a 61 year old female with a medical history significant for sickle cell disease, rheumatoid arthritis, hypothyroidism, and vitamin D deficiency was evaluated in clinic on 03/27/2022. Patient's TSH continues to be elevated. Continue Levothydroxine 200 mcg every morning around 6 am prior to eating breakfast. Will recheck TSH in 1 month. Please schedule a lab appointment.   Nolon Nations  APRN, MSN, FNP-C [facility] [address] [place], [place] [address] [phone]

## 2022-04-02 IMAGING — US US THYROID
1 series · 14 of 25 positions shown · non-contrast
Comparison: None.

CLINICAL DATA: Hypothyroidism

EXAM:
THYROID ULTRASOUND
TECHNIQUE: Ultrasound examination of the thyroid gland and adjacent soft
tissues was performed.

[Series 1: us thyroid · 0.04mm/px · 14 of 40 slices shown]
[im 1/40]
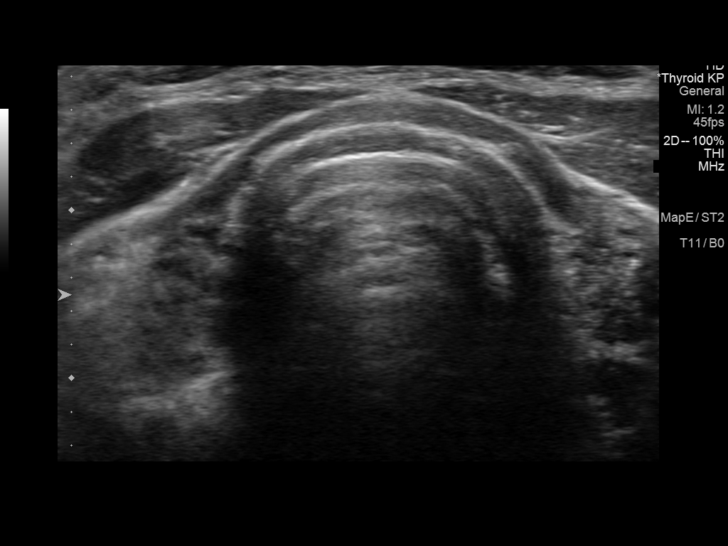
[im 4/40]
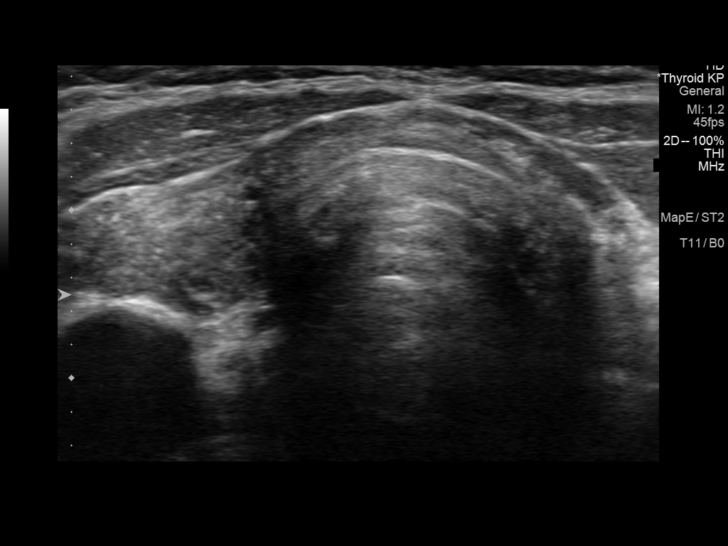
[im 7/40]
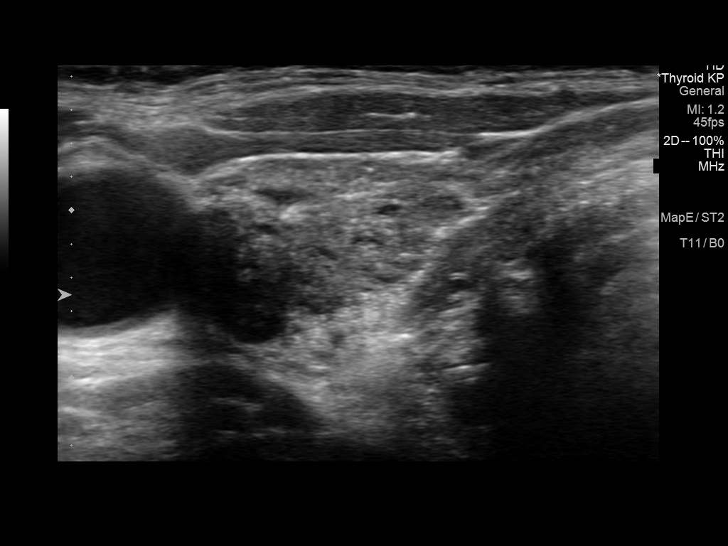
[im 10/40]
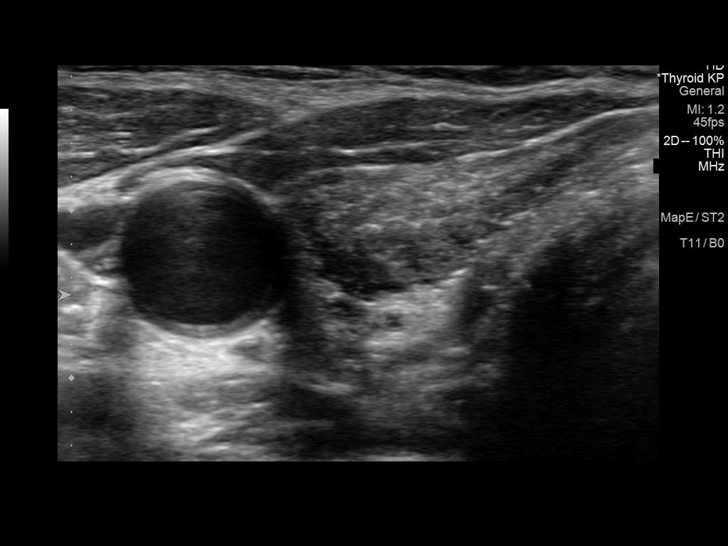
[im 14/40]
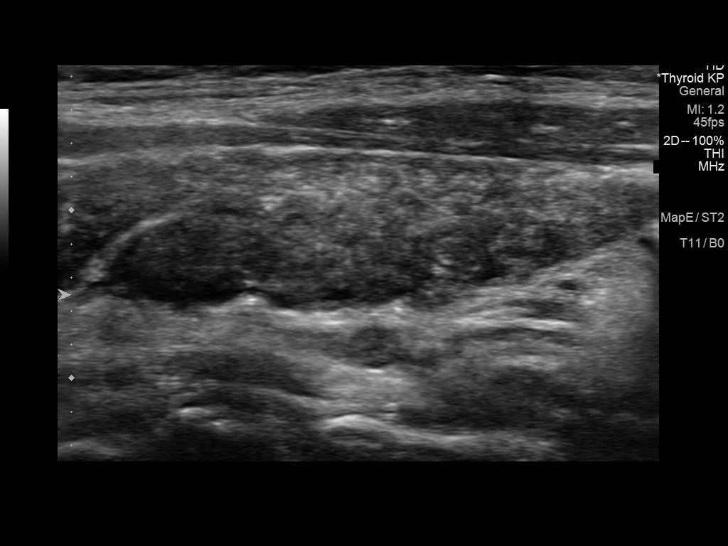
[im 15/40]
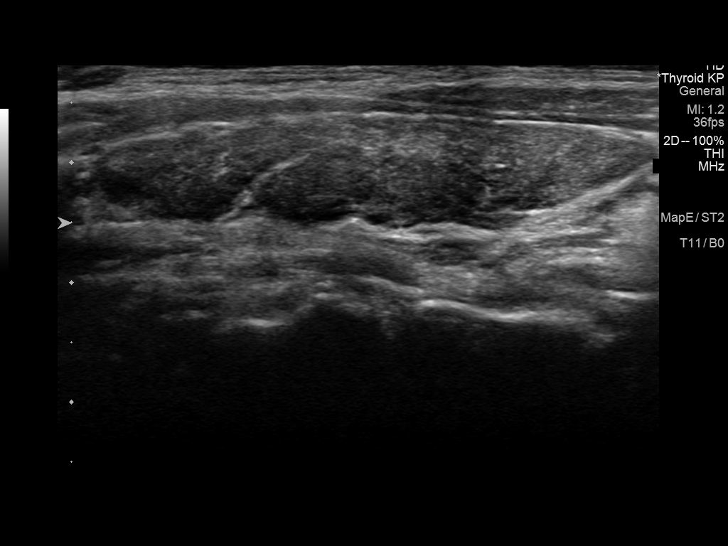
[im 18/40]
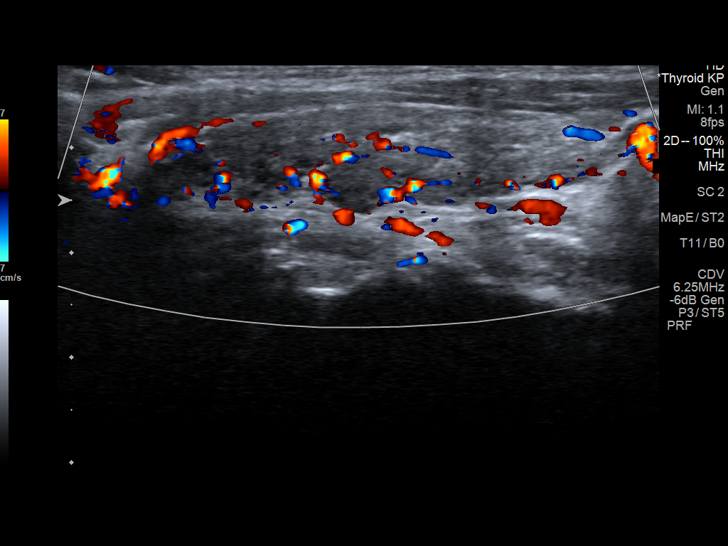
[im 22/40]
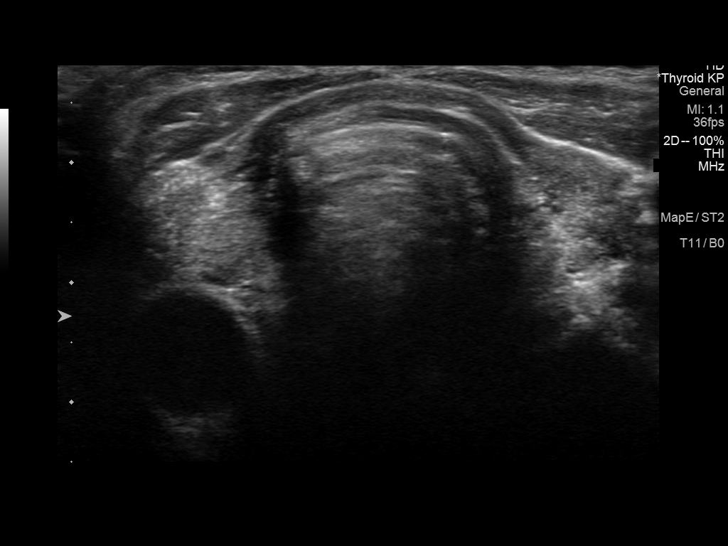
[im 25/40]
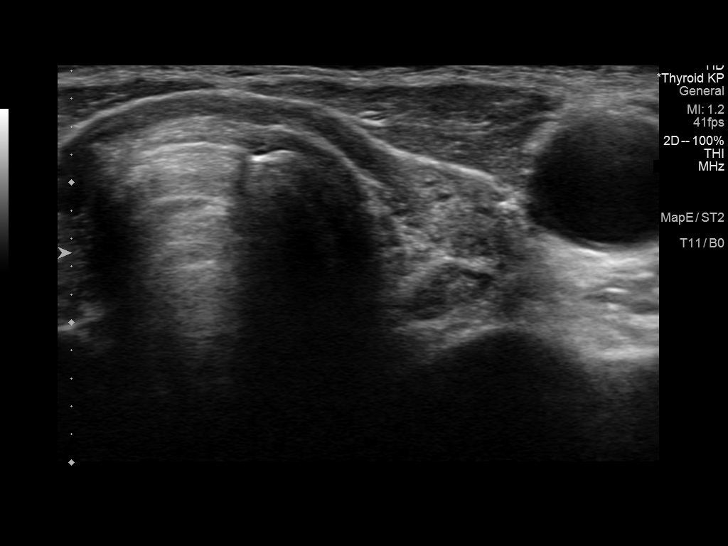
[im 27/40]
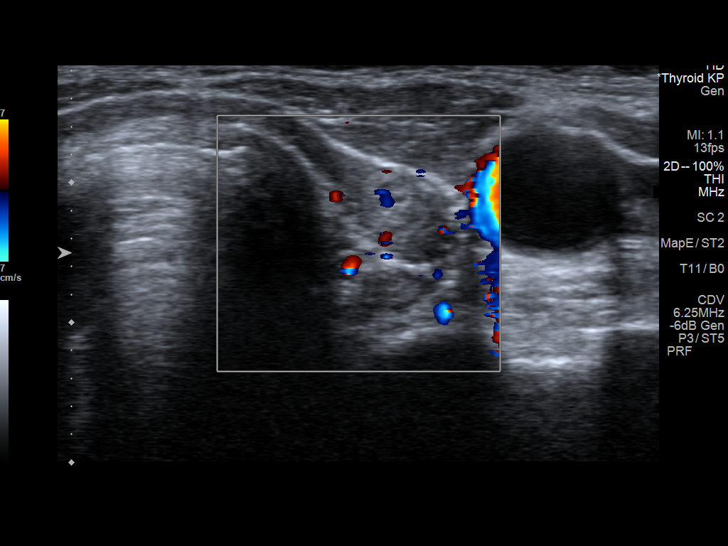
[im 30/40]
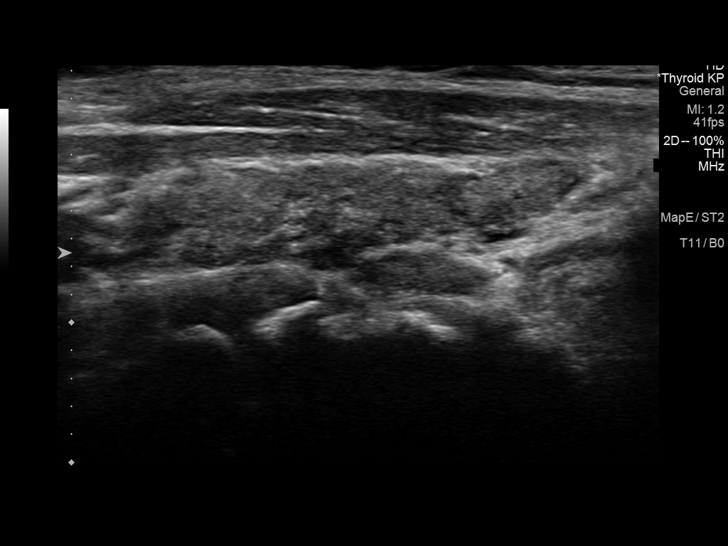
[im 33/40]
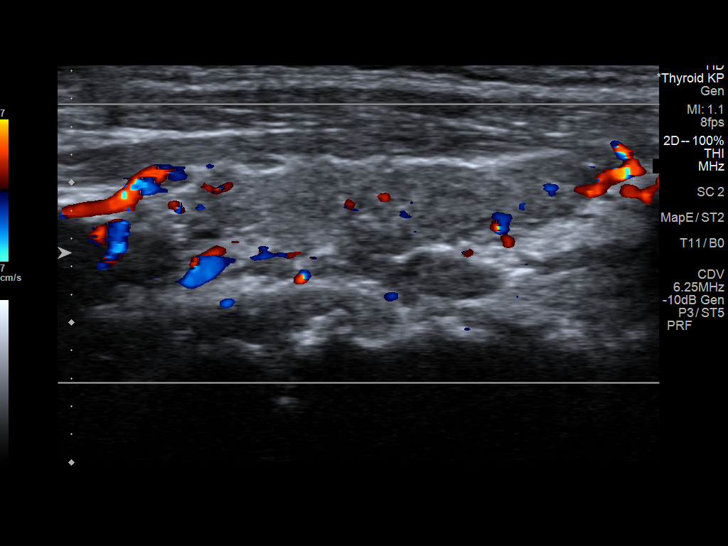
[im 36/40]
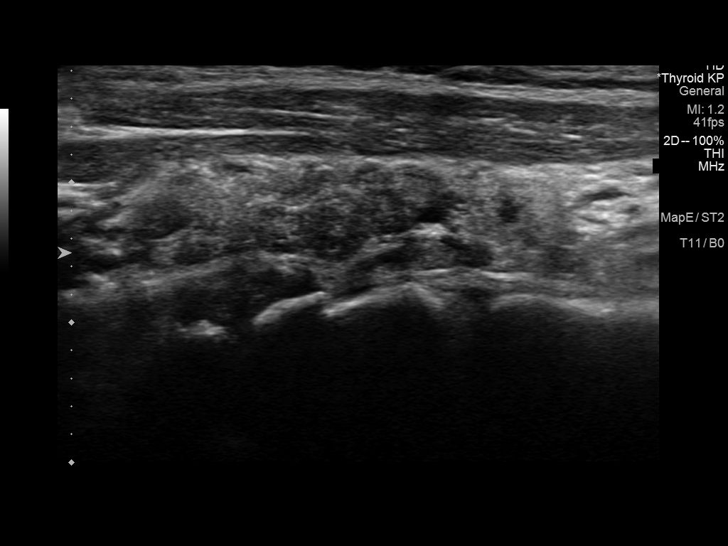
[im 40/40]
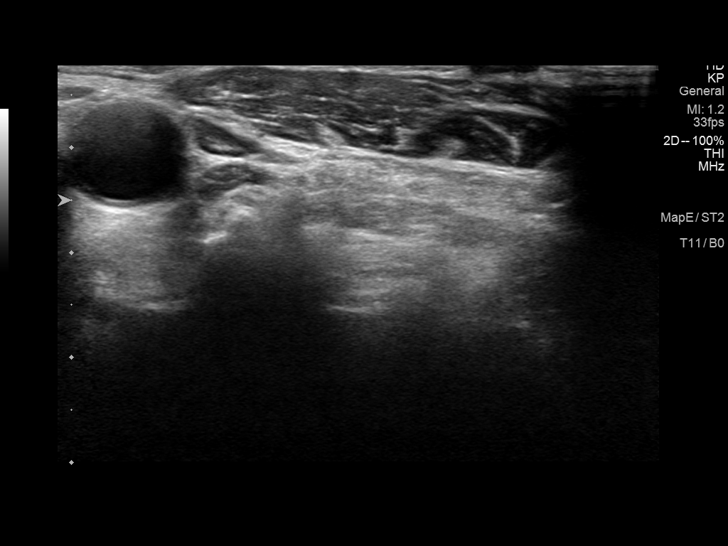

[14 of 25 positions shown; findings below may reference images not displayed]

FINDINGS: Parenchymal Echotexture: Moderately heterogeneous

Isthmus: 0.2 cm

Right lobe: 5.1 x 1.0 x 1.6 cm

Left lobe: 3.5 x 1.0 x 1.0 cm

_________________________________________________________

Estimated total number of nodules >/= 1 cm: 0

Number of spongiform nodules >/=  2 cm not described below (TR1): 0

Number of mixed cystic and solid nodules >/= 1.5 cm not described
below (TR2): 0

_________________________________________________________

No discrete nodules are seen within the thyroid gland.
IMPRESSION: Moderate diffuse heterogeneity of the thyroid parenchyma without
discrete nodule.

The above is in keeping with the ACR TI-RADS recommendations - [HOSPITAL] 7103;[DATE].

## 2022-04-02 IMAGING — US US ABDOMEN LIMITED
1 series · 14 of 25 positions shown · non-contrast
Comparison: April 19, 2015.

CLINICAL DATA: Sickle cell crisis.

EXAM:
ULTRASOUND ABDOMEN LIMITED RIGHT UPPER QUADRANT

[Series 1: us abdomen limited · 0.20mm/px · 14 of 63 slices shown]
[im 1/63]
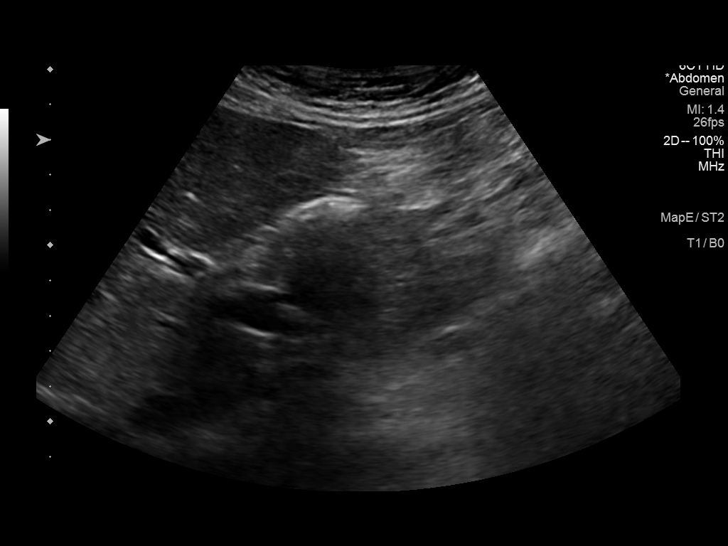
[im 6/63]
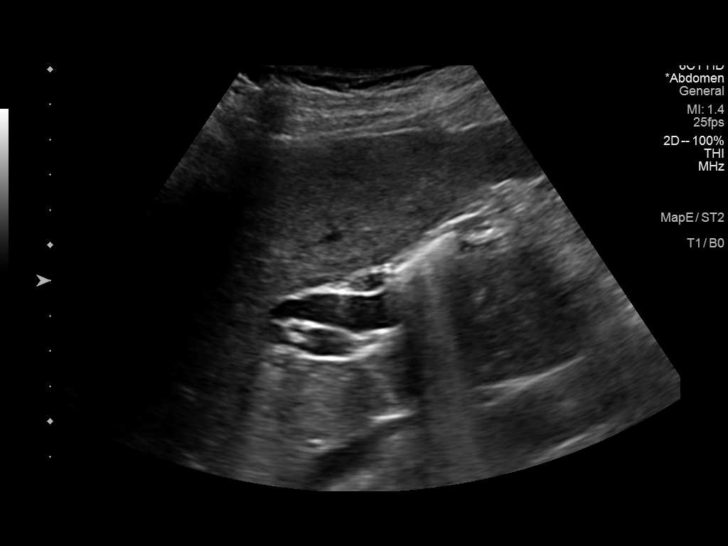
[im 11/63]
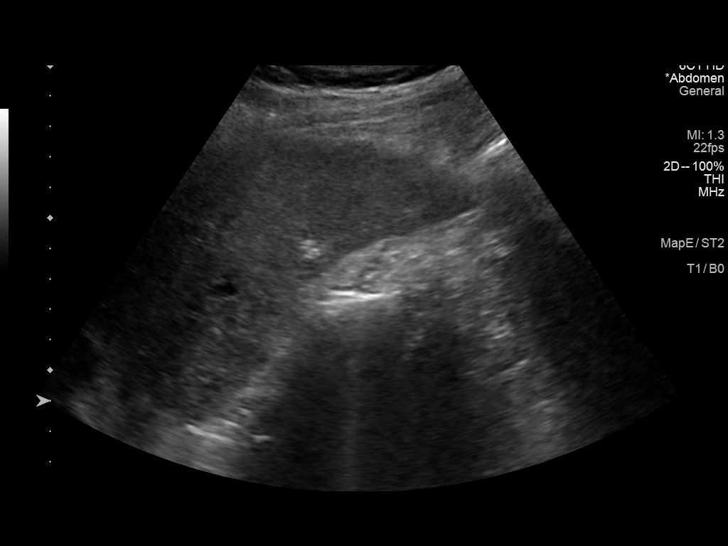
[im 16/63]
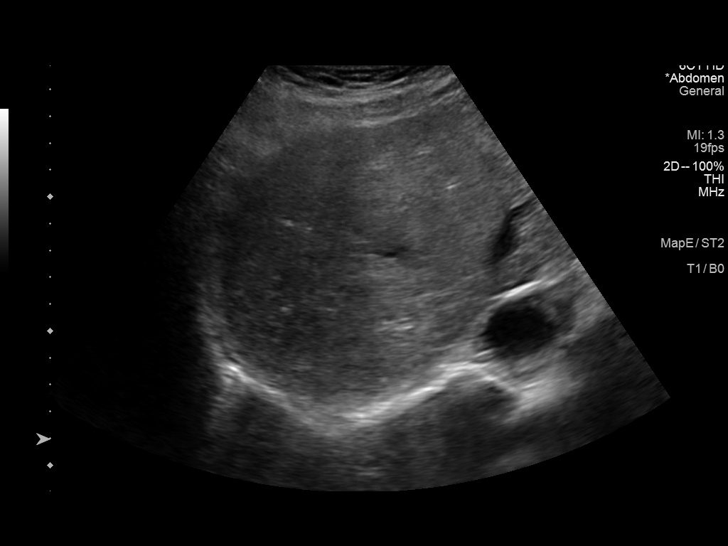
[im 21/63]
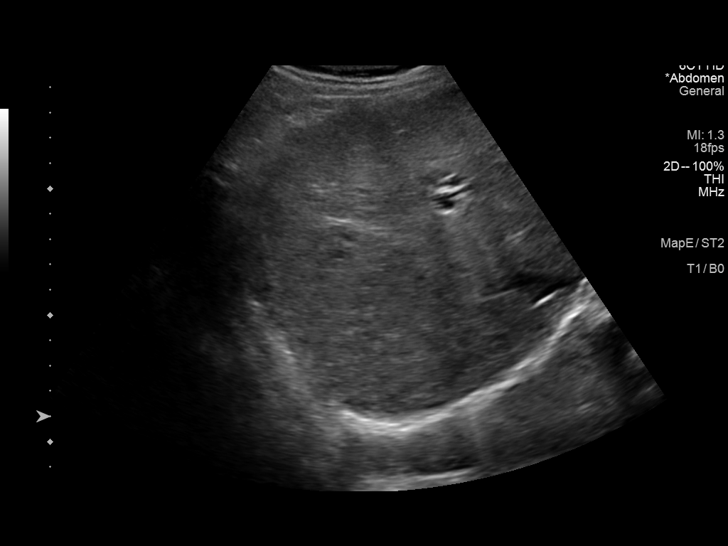
[im 24/63]
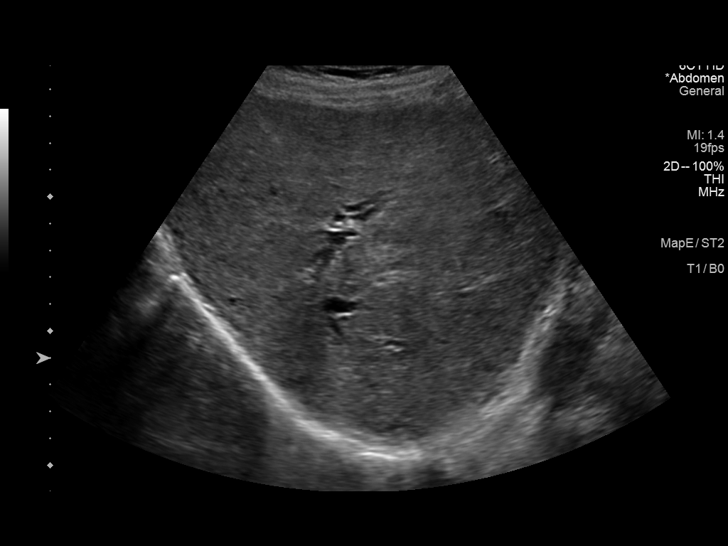
[im 29/63]
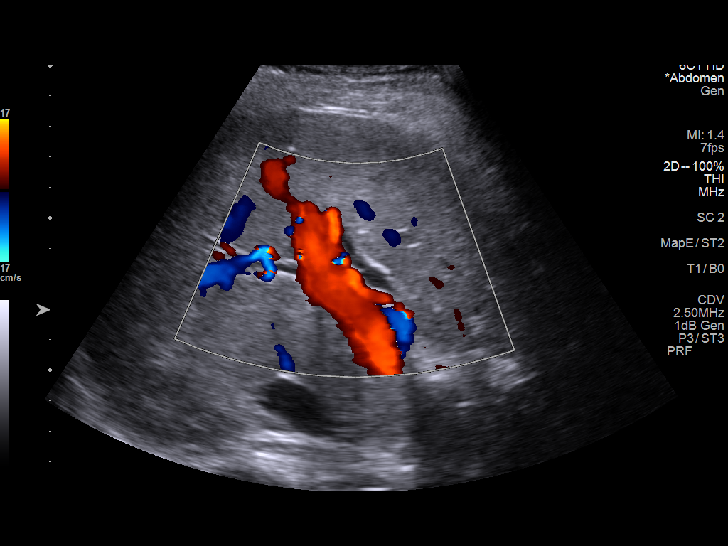
[im 34/63]
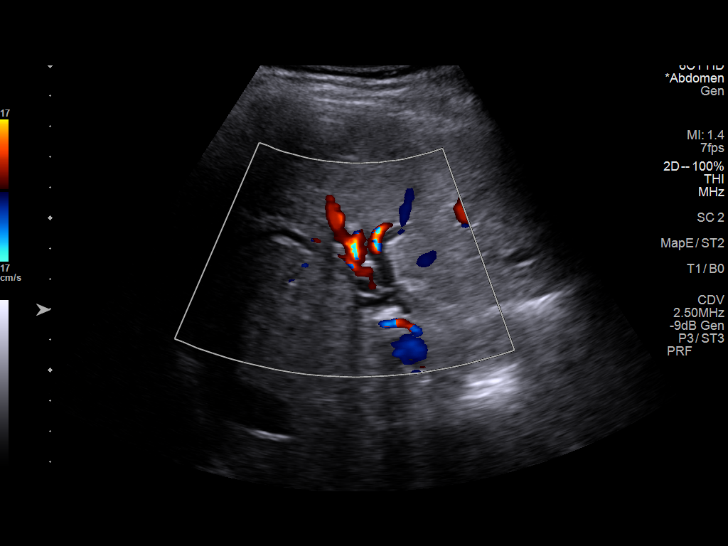
[im 39/63]
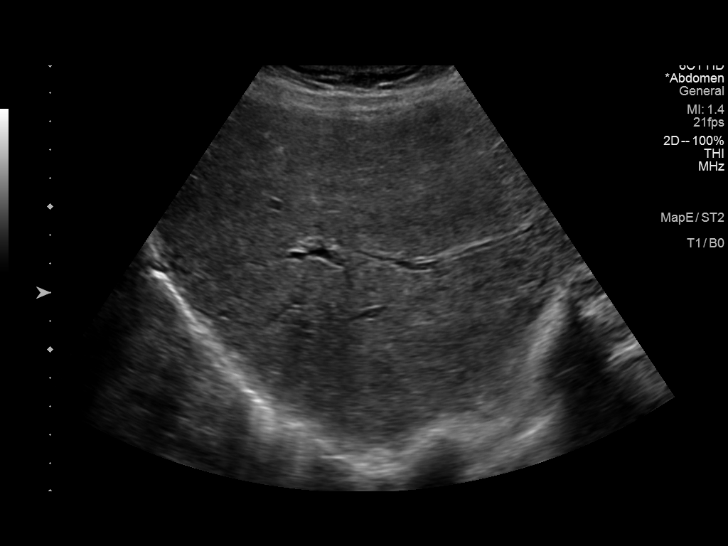
[im 42/63]
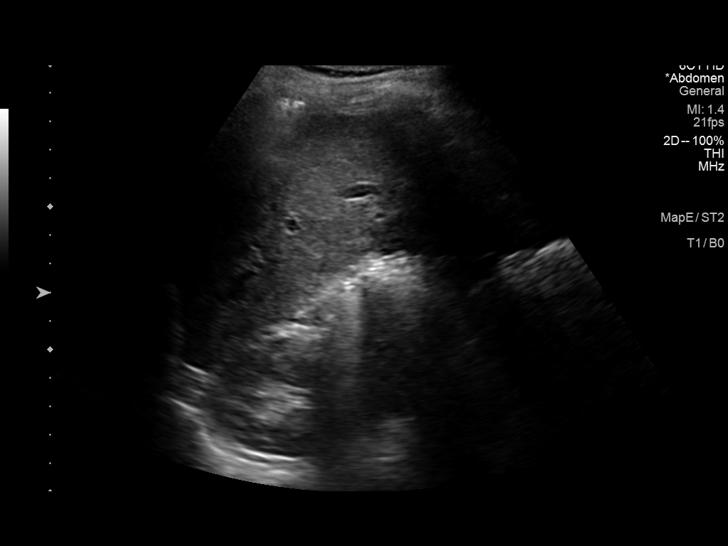
[im 47/63]
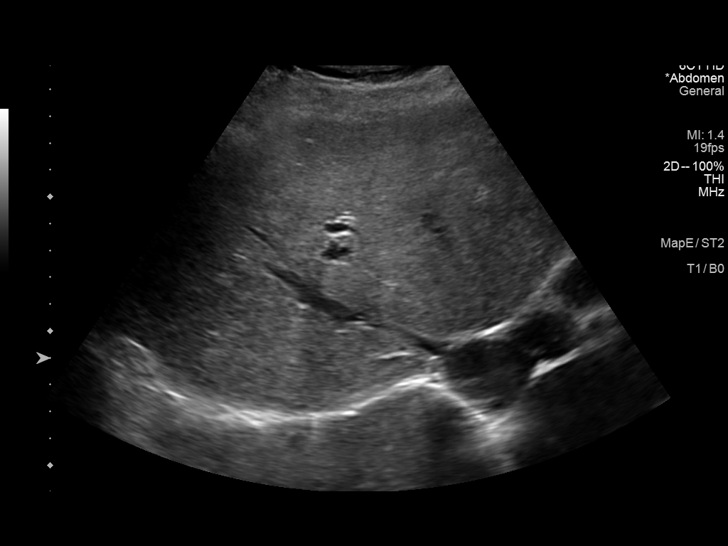
[im 52/63]
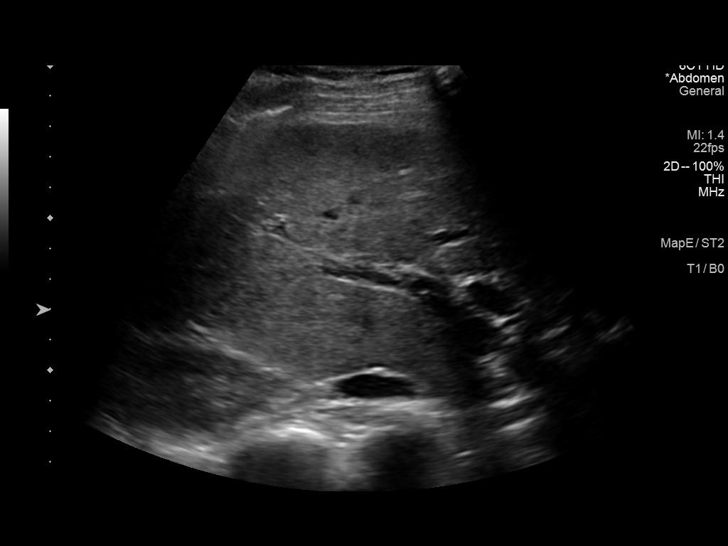
[im 57/63]
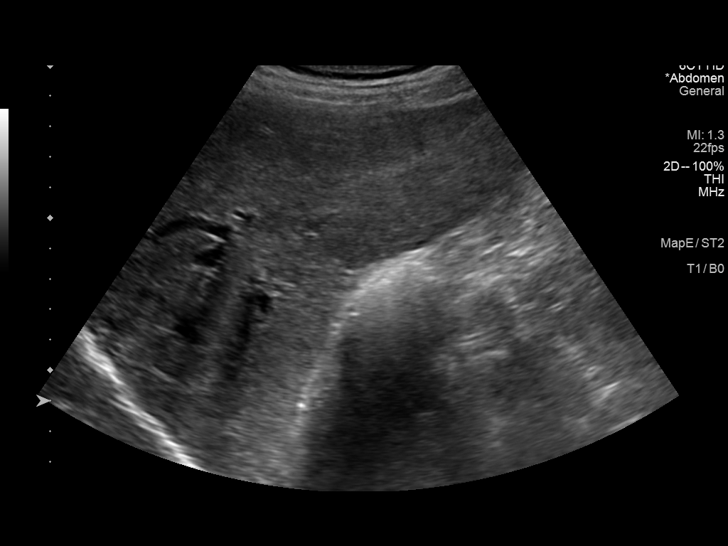
[im 63/63]
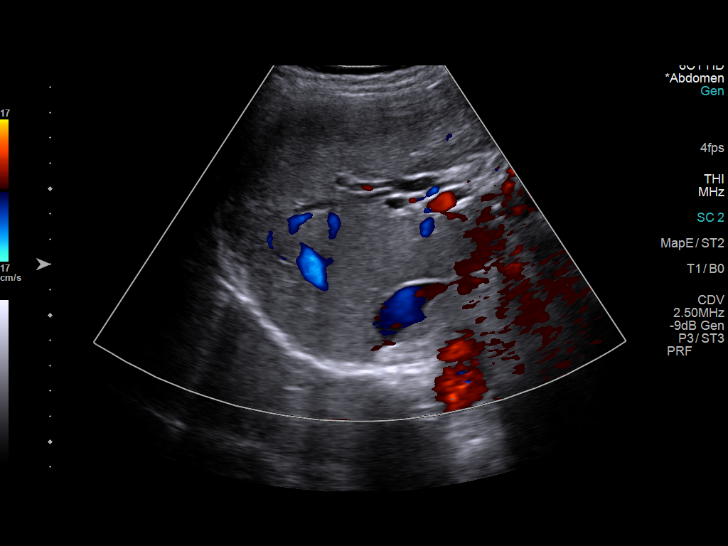

[14 of 25 positions shown; findings below may reference images not displayed]

FINDINGS: Gallbladder:

Status post cholecystectomy.

Common bile duct:

Diameter: 10 mm which is dilated, but most likely due to post
cholecystectomy status.

Liver:

No focal lesion identified. Within normal limits in parenchymal
echogenicity. Portal vein is patent on color Doppler imaging with
normal direction of blood flow towards the liver.

Other: None.
IMPRESSION: Dilated common bile duct is noted most likely due to post
cholecystectomy status. No other definite abnormality seen in the
right upper quadrant of the abdomen.

## 2022-04-30 DIAGNOSIS — H43811 Vitreous degeneration, right eye: Secondary | ICD-10-CM | POA: Diagnosis not present

## 2022-05-13 ENCOUNTER — Encounter (HOSPITAL_COMMUNITY): Payer: Self-pay

## 2022-05-13 ENCOUNTER — Emergency Department (HOSPITAL_COMMUNITY): Payer: Medicare Other

## 2022-05-13 ENCOUNTER — Other Ambulatory Visit: Payer: Self-pay

## 2022-05-13 ENCOUNTER — Emergency Department (HOSPITAL_COMMUNITY)
Admission: EM | Admit: 2022-05-13 | Discharge: 2022-05-14 | Disposition: A | Discharge status: Home / Self Care | Payer: Medicare Other | Attending: Emergency Medicine | Admitting: Emergency Medicine

## 2022-05-13 DIAGNOSIS — J811 Chronic pulmonary edema: Secondary | ICD-10-CM | POA: Diagnosis not present

## 2022-05-13 DIAGNOSIS — D5700 Hb-SS disease with crisis, unspecified: Secondary | ICD-10-CM | POA: Diagnosis not present

## 2022-05-13 DIAGNOSIS — D571 Sickle-cell disease without crisis: Secondary | ICD-10-CM | POA: Insufficient documentation

## 2022-05-13 DIAGNOSIS — D649 Anemia, unspecified: Secondary | ICD-10-CM | POA: Insufficient documentation

## 2022-05-13 DIAGNOSIS — R5383 Other fatigue: Secondary | ICD-10-CM | POA: Diagnosis present

## 2022-05-13 DIAGNOSIS — R001 Bradycardia, unspecified: Secondary | ICD-10-CM | POA: Insufficient documentation

## 2022-05-13 DIAGNOSIS — R0602 Shortness of breath: Secondary | ICD-10-CM | POA: Diagnosis not present

## 2022-05-13 DIAGNOSIS — R0789 Other chest pain: Secondary | ICD-10-CM | POA: Diagnosis not present

## 2022-05-13 DIAGNOSIS — M25552 Pain in left hip: Secondary | ICD-10-CM | POA: Diagnosis not present

## 2022-05-13 LAB — PREPARE RBC (CROSSMATCH)

## 2022-05-13 LAB — COMPREHENSIVE METABOLIC PANEL
ALT: 14 U/L (ref 0–44)
AST: 72 U/L — ABNORMAL HIGH (ref 15–41)
Albumin: 3.7 g/dL (ref 3.5–5.0)
Alkaline Phosphatase: 61 U/L (ref 38–126)
Anion gap: 7 (ref 5–15)
BUN: 12 mg/dL (ref 8–23)
CO2: 23 mmol/L (ref 22–32)
Calcium: 8.7 mg/dL — ABNORMAL LOW (ref 8.9–10.3)
Chloride: 109 mmol/L (ref 98–111)
Creatinine, Ser: 1.18 mg/dL — ABNORMAL HIGH (ref 0.44–1.00)
GFR, Estimated: 52 mL/min — ABNORMAL LOW (ref >60)
Glucose, Bld: 89 mg/dL (ref 70–99)
Potassium: 4.1 mmol/L (ref 3.5–5.1)
Sodium: 139 mmol/L (ref 135–145)
Total Bilirubin: 3.5 mg/dL — ABNORMAL HIGH (ref 0.3–1.2)
Total Protein: 7.7 g/dL (ref 6.5–8.1)

## 2022-05-13 LAB — CBC WITH DIFFERENTIAL/PLATELET
Abs Immature Granulocytes: 0.05 10*3/uL (ref 0.00–0.07)
Basophils Absolute: 0.1 10*3/uL (ref 0.0–0.1)
Basophils Relative: 1 %
Eosinophils Absolute: 0.4 10*3/uL (ref 0.0–0.5)
Eosinophils Relative: 4 %
HCT: 15.8 % — ABNORMAL LOW (ref 36.0–46.0)
Hemoglobin: 5.6 g/dL — CL (ref 12.0–15.0)
Immature Granulocytes: 1 %
Lymphocytes Relative: 39 %
Lymphs Abs: 3.8 10*3/uL (ref 0.7–4.0)
MCH: 32 pg (ref 26.0–34.0)
MCHC: 35.4 g/dL (ref 30.0–36.0)
MCV: 90.3 fL (ref 80.0–100.0)
Monocytes Absolute: 0.7 10*3/uL (ref 0.1–1.0)
Monocytes Relative: 8 %
Neutro Abs: 4.6 10*3/uL (ref 1.7–7.7)
Neutrophils Relative %: 47 %
Platelets: 175 10*3/uL (ref 150–400)
RBC: 1.75 MIL/uL — ABNORMAL LOW (ref 3.87–5.11)
RDW: 25 % — ABNORMAL HIGH (ref 11.5–15.5)
WBC: 9.6 10*3/uL (ref 4.0–10.5)
nRBC: 2.4 % — ABNORMAL HIGH (ref 0.0–0.2)

## 2022-05-13 LAB — RETICULOCYTES
Immature Retic Fract: 40.8 % — ABNORMAL HIGH (ref 2.3–15.9)
RBC.: 1.72 MIL/uL — ABNORMAL LOW (ref 3.87–5.11)
Retic Count, Absolute: 372.5 10*3/uL — ABNORMAL HIGH (ref 19.0–186.0)
Retic Ct Pct: 21.9 % — ABNORMAL HIGH (ref 0.4–3.1)

## 2022-05-13 LAB — BRAIN NATRIURETIC PEPTIDE: B Natriuretic Peptide: 80.4 pg/mL (ref 0.0–100.0)

## 2022-05-13 LAB — TROPONIN I (HIGH SENSITIVITY): Troponin I (High Sensitivity): 5 ng/L (ref <18)

## 2022-05-13 MED ORDER — SODIUM CHLORIDE 0.9 % IV SOLN
10.0000 mL/h | Freq: Once | INTRAVENOUS | Status: AC
  Administered 2022-05-13: 10 mL/h via INTRAVENOUS

## 2022-05-13 MED ORDER — ACETAMINOPHEN 325 MG PO TABS
650.0000 mg | ORAL_TABLET | Freq: Once | ORAL | Status: AC
Start: 2022-05-13 — End: 2022-05-13
  Administered 2022-05-13: 650 mg via ORAL
  Filled 2022-05-13: qty 2

## 2022-05-13 MED ORDER — DIPHENHYDRAMINE HCL 25 MG PO CAPS
25.0000 mg | ORAL_CAPSULE | Freq: Four times a day (QID) | ORAL | Status: DC | PRN
  Administered 2022-05-13: 50 mg via ORAL
  Filled 2022-05-13: qty 2

## 2022-05-13 MED ORDER — DIPHENHYDRAMINE HCL 25 MG PO CAPS: 25.0000 mg | ORAL_CAPSULE | Freq: Four times a day (QID) | ORAL | Status: DC | PRN

## 2022-05-13 NOTE — ED Triage Notes (Signed)
Pt presents with c/o bilateral hip pain and dizziness. Pt reports that she also believes her hemoglobin is low, hx of blood transfusions in the past.

## 2022-05-13 NOTE — ED Provider Notes (Signed)
[facility]-EMERGENCY DEPT Provider Note   CSN: [id number] Arrival date & time: 05/13/22  1618     History  Chief Complaint  Patient presents with   Dizziness   Hip Pain    [name] is a 61 y.o. female.   Dizziness Hip Pain  Patient is a 61 year old female with a past medical history significant for thyroid disease, sickle cell disease, hepatitis C, anemia of chronic disease  Patient presents emergency room today with complaints of fatigue for the past 1 week.  She states she has had bilateral hip pain for the past 2 weeks she states that she does have issues with hip pain but states it is more achy than usual today. No current CP or SOB but states she felt somewhat SOB overnight.  She states that she felt some chest tightness yesterday evening.  She took some Tylenol which seemed to help with her pain.  She also took 200 mg of ibuprofen.  She states that she feels that her legs are symmetrically somewhat swollen.  She denies any unilateral or asymmetric swelling.  She denies any cough or hemoptysis.     Home Medications Prior to Admission medications   Medication Sig Start Date End Date Taking? Authorizing Provider  acetaminophen (TYLENOL) 650 MG CR tablet Take 1,300 mg by mouth every 8 (eight) hours as needed (arthritis pain).    [provider]  albuterol (VENTOLIN HFA) 108 (90 Base) MCG/ACT inhaler Inhale 1 puff into the lungs 2 (two) times daily. 02/29/20   [provider]  ASCORBIC ACID PO Take 1 tablet by mouth every morning. Patient not taking: Reported on 03/27/2022    [provider]  Cholecalciferol (VITAMIN D3 PO) Take 1 tablet by mouth every morning.    [provider]  cyclobenzaprine (FLEXERIL) 10 MG tablet Take 1 tablet (10 mg total) by mouth at bedtime. Ran out 12/26/21   Massie Maroon, FNP  DULoxetine (CYMBALTA) 30 MG capsule Take 1 capsule (30 mg total) by mouth daily. 12/26/21   Massie Maroon, FNP   folic acid (FOLVITE) 1 MG tablet Take 1 tablet (1 mg total) by mouth daily. 12/26/21   Massie Maroon, FNP  FOLIC ACID PO Take 1 tablet by mouth 2 (two) times daily.    [provider]  gabapentin (NEURONTIN) 300 MG capsule Take 300 mg by mouth daily as needed (nerve pain).    [provider]  hydrOXYzine (ATARAX) 10 MG tablet Take 1 tablet (10 mg total) by mouth 3 (three) times daily as needed. 03/27/22   Massie Maroon, FNP  ibuprofen (ADVIL) 800 MG tablet Take 1 tablet (800 mg total) by mouth every 8 (eight) hours as needed. 01/15/22   Ivonne Andrew, NP  levothyroxine (SYNTHROID) 200 MCG tablet Take 200 mcg by mouth daily before breakfast.    [provider]  montelukast (SINGULAIR) 10 MG tablet Take 10 mg by mouth at bedtime as needed (allergies/congestion).    [provider]  Omega-3 Fatty Acids (FISH OIL PO) Take 1 capsule by mouth every morning.    [provider]      Allergies    Ampicillin and Demerol [meperidine]    Review of Systems   Review of Systems  Neurological:  Positive for dizziness.    Physical Exam Updated Vital Signs BP 124/71   Pulse 66   Temp 98 F (36.7 C) (Oral)   Resp (!) 25   Ht 5\' 11"  (1.803  m)   Wt 63.5 kg   SpO2 98%   BMI 19.53 kg/m  Physical Exam Vitals and nursing note reviewed.  Constitutional:      General: She is not in acute distress.    Appearance: Normal appearance. She is not ill-appearing.     Comments: Chronically ill-appearing 61 year old female in no acute distress.  HENT:     Head: Normocephalic and atraumatic.     Nose: Nose normal.  Eyes:     General: No scleral icterus.       Right eye: No discharge.        Left eye: No discharge.     Conjunctiva/sclera: Conjunctivae normal.  Cardiovascular:     Rate and Rhythm: Regular rhythm. Bradycardia present.     Pulses: Normal pulses.     Heart sounds: Normal heart sounds.  Pulmonary:     Effort: Pulmonary effort is normal. No  respiratory distress.     Breath sounds: No stridor. No wheezing.  Abdominal:     Palpations: Abdomen is soft.     Tenderness: There is no abdominal tenderness. There is no guarding or rebound.  Musculoskeletal:     Cervical back: Normal range of motion.     Right lower leg: No edema.     Left lower leg: No edema.     Comments: No LEE  Skin:    General: Skin is warm and dry.     Capillary Refill: Capillary refill takes less than 2 seconds.  Neurological:     Mental Status: She is alert and oriented to person, place, and time. Mental status is at baseline.  Psychiatric:        Mood and Affect: Mood normal.        Behavior: Behavior normal.     ED Results / Procedures / Treatments   Labs (all labs ordered are listed, but only abnormal results are displayed) Labs Reviewed  CBC WITH DIFFERENTIAL/PLATELET - Abnormal; Notable for the following components:      Result Value   RBC 1.75 (*)    Hemoglobin 5.6 (*)    HCT 15.8 (*)    RDW 25.0 (*)    nRBC 2.4 (*)    All other components within normal limits  COMPREHENSIVE METABOLIC PANEL - Abnormal; Notable for the following components:   Creatinine, Ser 1.18 (*)    Calcium 8.7 (*)    AST 72 (*)    Total Bilirubin 3.5 (*)    GFR, Estimated 52 (*)    All other components within normal limits  RETICULOCYTES - Abnormal; Notable for the following components:   Retic Ct Pct 21.9 (*)    RBC. 1.72 (*)    Retic Count, Absolute 372.5 (*)    Immature Retic Fract 40.8 (*)    All other components within normal limits  BRAIN NATRIURETIC PEPTIDE  TYPE AND SCREEN  PREPARE RBC (CROSSMATCH)  TROPONIN I (HIGH SENSITIVITY)    EKG None  Radiology DG Pelvis 1-2 Views  Result Date: 05/13/2022 CLINICAL DATA:  Bilateral hip pain.  History of sickle cell disease. EXAM: PELVIS - 1-2 VIEW COMPARISON:  CT 12/01/2019 FINDINGS: There is no evidence of pelvic fracture or diastasis. No pelvic bone lesions are seen. IMPRESSION: Negative. Electronically  Signed   By: Burman Nieves M.D.   On: 05/13/2022 21:18   DG Chest Portable 1 View  Result Date: 05/13/2022 CLINICAL DATA:  Shortness of breath EXAM: PORTABLE CHEST 1 VIEW COMPARISON:  03/24/21 CXR FINDINGS: No pleural effusion.  No pneumothorax. Cardiomegaly, similar to prior. Mild pulmonary edema. No new focal airspace opacity. No displaced rib fractures. Visualized upper abdomen is unremarkable. IMPRESSION: Cardiomegaly with mild pulmonary edema. No new focal airspace opacity. Electronically Signed   By: Marin Roberts M.D.   On: 05/13/2022 19:37    Procedures Procedures    Medications Ordered in ED Medications  acetaminophen (TYLENOL) tablet 650 mg (650 mg Oral Given 05/13/22 2013)  0.9 %  sodium chloride infusion (10 mL/hr Intravenous New Bag/Given 05/13/22 2013)    ED Course/ Medical Decision Making/ A&P Clinical Course as of 05/13/22 2140  Sun May 13, 2022  1906 8/28 last blood transfusion [WF]  1908 Fatigue for 1 week. BL hip pain for 2 weeks. No falls or injuries.  [WF]    Clinical Course User Index [WF] Tedd Sias, [place]                           Medical Decision Making Amount and/or Complexity of Data Reviewed Labs: ordered. Radiology: ordered.  Risk OTC drugs. Prescription drug management.   This patient presents to the ED for concern of fatigue, this involves a number of treatment options, and is a complaint that carries with it a high risk of complications and morbidity. A differential diagnosis was considered for the patient's symptoms which is discussed below:   The differential diagnosis of weakness includes but is not limited to neurologic causes (GBS, myasthenia gravis, CVA, MS, ALS, transverse myelitis, spinal cord injury, CVA, botulism, ) and other causes: ACS, Arrhythmia, syncope, orthostatic hypotension, sepsis, hypoglycemia, electrolyte disturbance, hypothyroidism, respiratory failure, symptomatic anemia, dehydration, heat injury, polypharmacy,  malignancy.   Co morbidities: Discussed in HPI   Brief History:  Patient is a 61 year old female with a past medical history significant for thyroid disease, sickle cell disease, hepatitis C, anemia of chronic disease  Patient presents emergency room today with complaints of fatigue for the past 1 week.  She states she has had bilateral hip pain for the past 2 weeks she states that she does have issues with hip pain but states it is more achy than usual today. No current CP or SOB but states she felt somewhat SOB overnight.  She states that she felt some chest tightness yesterday evening.  She took some Tylenol which seemed to help with her pain.  She also took 200 mg of ibuprofen.  She states that she feels that her legs are symmetrically somewhat swollen.  She denies any unilateral or asymmetric swelling.  She denies any cough or hemoptysis.    EMR reviewed including pt PMHx, past surgical history and past visits to ER.   See HPI for more details   Lab Tests:  I ordered and independently interpreted labs. Labs notable for hemoglobin of 5.6 seems that her baseline is around 6.7.  She is having symptoms today of fatigue.  She denies any chest pain or shortness of breath currently but did state that she had some last night.  Troponin x1 within normal limits, EKG nonischemic.  Chest x-ray seems to be baseline for patient.  I reviewed CT PE scan from 2019 and chest x-rays since then it seems that she has had some mild cardiomegaly and pulmonary vascular congestion.  No significant crackles on my examination and no JVP to suggest CHF.  CMP with creatinine baseline.  Bilirubin elevated consistent with hemolysis.  Reticulocyte count elevated consistent with normal bone marrow response.  BNP 80 which is  unremarkable.    Imaging Studies:  Abnormal findings. I personally reviewed all imaging studies. Imaging notable for Chest x-ray with some cardiomegaly seems that this is not significantly  changed from prior.  I reviewed chest x-ray today agree radiology read.   Cardiac Monitoring:  The patient was maintained on a cardiac monitor.  I personally viewed and interpreted the cardiac monitored which showed an underlying rhythm of: NSR occasionally mild bradycardia EKG non-ischemic   Medicines ordered:  I ordered Tylenol and 1 unit of PRBC.   Critical Interventions:  PRBC transfusion   Consults/Attending Physician   I discussed this case with my attending physician who cosigned this note including patient's presenting symptoms, physical exam, and planned diagnostics and interventions. Attending physician stated agreement with plan or made changes to plan which were implemented.   Reevaluation:  Patient has not received blood products at time of shift change.  Social Determinants of Health:  Social work/case management involved    Problem List / ED Course:  Sickle cell anemia with symptomatic anemia.  Does not seem to be experiencing any significant sickle cell pain apart from some bilateral hip pain.  Pelvis x-rays without any evidence of acute bony infarcts.  Given Tylenol and will transfuse with PRBC.    Dispostion:  9:44 PM Care of [name] transferred to [facility] and Dr. Blinda Leatherwood at the end of my shift as the patient will require reassessment once labs/imaging have resulted. Patient presentation, ED course, and plan of care discussed with review of all pertinent labs and imaging. Please see his/her note for further details regarding further ED course and disposition. Plan at time of handoff is DC after transfusion. This may be altered or completely changed at the discretion of the oncoming team pending results of further workup.   Final Clinical Impression(s) / ED Diagnoses Final diagnoses:  Symptomatic anemia  Sickle cell disease without crisis [facility])    Rx / DC Orders ED Discharge Orders     None         Gailen Shelter, [place] 05/13/22  2144    Lorre Nick, MD 05/13/22 2226

## 2022-05-13 NOTE — Discharge Instructions (Addendum)
Follow-up NP Hollis Take Tylenol 1000 mg every 6 hours  You will need to have your blood counts rechecked with NP Hollis.

## 2022-05-13 NOTE — ED Provider Triage Note (Signed)
Emergency Medicine Provider Triage Evaluation Note  [name] , a 61 y.o. female  was evaluated in triage.  She has a past medical history of sickle cell anemia, rheumatoid arthritis, and hypothyroidism. Pt complains of decreased energy, shortness of breath, lightheadedness.  These are the symptoms that she gets when her blood counts are low and she requires blood transfusion.  Last transfusion was a couple of months ago.  Baseline hemoglobin is around 7.  She was in the fives at time of last transfusion.  She denies associated infection symptoms.  She has had some pain in her hips that she relates to sickle cell anemia.  Currently does not have pain medication and does not take regularly.  Describes pain as burning.  Review of Systems  Positive: Lightheadedness Negative: Fever  Physical Exam  BP (!) 141/68 (BP Location: Left Arm)   Pulse (!) 52   Temp 98.3 F (36.8 C)   Resp 18   SpO2 97%  Gen:   Awake, no distress   Resp:  Normal effort  MSK:   Moves extremities without difficulty  Other:  Heart rate slightly bradycardic, pale conjunctiva  Medical Decision Making  Medically screening exam initiated at 5:34 PM.  Appropriate orders placed.  [name] was informed that the remainder of the evaluation will be completed by another provider, this initial triage assessment does not replace that evaluation, and the importance of remaining in the ED until their evaluation is complete.     Carlisle Cater, PA-C 05/13/22 1736

## 2022-05-14 DIAGNOSIS — Z885 Allergy status to narcotic agent status: Secondary | ICD-10-CM | POA: Diagnosis not present

## 2022-05-14 DIAGNOSIS — J811 Chronic pulmonary edema: Secondary | ICD-10-CM | POA: Diagnosis not present

## 2022-05-14 DIAGNOSIS — G894 Chronic pain syndrome: Secondary | ICD-10-CM | POA: Diagnosis not present

## 2022-05-14 DIAGNOSIS — E860 Dehydration: Secondary | ICD-10-CM | POA: Diagnosis not present

## 2022-05-14 DIAGNOSIS — R079 Chest pain, unspecified: Secondary | ICD-10-CM | POA: Diagnosis not present

## 2022-05-14 DIAGNOSIS — E039 Hypothyroidism, unspecified: Secondary | ICD-10-CM | POA: Diagnosis not present

## 2022-05-14 DIAGNOSIS — J90 Pleural effusion, not elsewhere classified: Secondary | ICD-10-CM | POA: Diagnosis not present

## 2022-05-14 DIAGNOSIS — Z880 Allergy status to penicillin: Secondary | ICD-10-CM | POA: Diagnosis not present

## 2022-05-14 DIAGNOSIS — R0789 Other chest pain: Secondary | ICD-10-CM | POA: Diagnosis not present

## 2022-05-14 DIAGNOSIS — Z832 Family history of diseases of the blood and blood-forming organs and certain disorders involving the immune mechanism: Secondary | ICD-10-CM | POA: Diagnosis not present

## 2022-05-14 DIAGNOSIS — D638 Anemia in other chronic diseases classified elsewhere: Secondary | ICD-10-CM | POA: Diagnosis not present

## 2022-05-14 DIAGNOSIS — Z6820 Body mass index (BMI) 20.0-20.9, adult: Secondary | ICD-10-CM | POA: Diagnosis not present

## 2022-05-14 DIAGNOSIS — M069 Rheumatoid arthritis, unspecified: Secondary | ICD-10-CM | POA: Diagnosis not present

## 2022-05-14 DIAGNOSIS — K7460 Unspecified cirrhosis of liver: Secondary | ICD-10-CM | POA: Diagnosis not present

## 2022-05-14 DIAGNOSIS — Z79899 Other long term (current) drug therapy: Secondary | ICD-10-CM | POA: Diagnosis not present

## 2022-05-14 DIAGNOSIS — N179 Acute kidney failure, unspecified: Secondary | ICD-10-CM | POA: Diagnosis not present

## 2022-05-14 DIAGNOSIS — E440 Moderate protein-calorie malnutrition: Secondary | ICD-10-CM | POA: Diagnosis not present

## 2022-05-14 DIAGNOSIS — K429 Umbilical hernia without obstruction or gangrene: Secondary | ICD-10-CM | POA: Diagnosis not present

## 2022-05-14 DIAGNOSIS — N3289 Other specified disorders of bladder: Secondary | ICD-10-CM | POA: Diagnosis not present

## 2022-05-14 DIAGNOSIS — I2510 Atherosclerotic heart disease of native coronary artery without angina pectoris: Secondary | ICD-10-CM | POA: Diagnosis not present

## 2022-05-14 DIAGNOSIS — I700 Atherosclerosis of aorta: Secondary | ICD-10-CM | POA: Diagnosis not present

## 2022-05-14 DIAGNOSIS — J984 Other disorders of lung: Secondary | ICD-10-CM | POA: Diagnosis not present

## 2022-05-14 DIAGNOSIS — E872 Acidosis, unspecified: Secondary | ICD-10-CM | POA: Diagnosis not present

## 2022-05-14 DIAGNOSIS — D5701 Hb-SS disease with acute chest syndrome: Secondary | ICD-10-CM | POA: Diagnosis not present

## 2022-05-14 LAB — TYPE AND SCREEN
ABO/RH(D): B POS
Antibody Screen: NEGATIVE
Unit division: 0

## 2022-05-14 LAB — BPAM RBC
Blood Product Expiration Date: 202311202359
ISSUE DATE / TIME: 202310222144
Unit Type and Rh: 5100

## 2022-05-14 NOTE — ED Provider Notes (Signed)
  Has received 1 unit PRBC's as ordered.  No acute complications.  VS remain stable.  Feel she is stable for discharge with OP follow-up as plan per prior team.  Understands to have her CBC re-checked with PCP this week.  Can return here for new concerns.   Larene Pickett, PA-C 05/14/22 0456    Orpah Greek, MD 05/14/22 [phone]

## 2022-05-17 ENCOUNTER — Inpatient Hospital Stay (HOSPITAL_COMMUNITY)
Admission: EM | Admit: 2022-05-17 | Discharge: 2022-05-21 | DRG: 811 | Disposition: A | Discharge status: Home / Self Care | Payer: Medicare Other | Attending: Internal Medicine | Admitting: Internal Medicine

## 2022-05-17 ENCOUNTER — Emergency Department (HOSPITAL_COMMUNITY): Payer: Medicare Other

## 2022-05-17 ENCOUNTER — Encounter (HOSPITAL_COMMUNITY): Payer: Self-pay | Admitting: *Deleted

## 2022-05-17 ENCOUNTER — Other Ambulatory Visit: Payer: Self-pay

## 2022-05-17 DIAGNOSIS — D5700 Hb-SS disease with crisis, unspecified: Secondary | ICD-10-CM | POA: Diagnosis not present

## 2022-05-17 DIAGNOSIS — G894 Chronic pain syndrome: Secondary | ICD-10-CM | POA: Diagnosis present

## 2022-05-17 DIAGNOSIS — Z6820 Body mass index (BMI) 20.0-20.9, adult: Secondary | ICD-10-CM | POA: Diagnosis not present

## 2022-05-17 DIAGNOSIS — J811 Chronic pulmonary edema: Secondary | ICD-10-CM | POA: Diagnosis not present

## 2022-05-17 DIAGNOSIS — I700 Atherosclerosis of aorta: Secondary | ICD-10-CM | POA: Diagnosis not present

## 2022-05-17 DIAGNOSIS — R071 Chest pain on breathing: Secondary | ICD-10-CM | POA: Diagnosis not present

## 2022-05-17 DIAGNOSIS — D638 Anemia in other chronic diseases classified elsewhere: Secondary | ICD-10-CM | POA: Diagnosis not present

## 2022-05-17 DIAGNOSIS — M069 Rheumatoid arthritis, unspecified: Secondary | ICD-10-CM | POA: Diagnosis present

## 2022-05-17 DIAGNOSIS — Z880 Allergy status to penicillin: Secondary | ICD-10-CM | POA: Diagnosis not present

## 2022-05-17 DIAGNOSIS — E860 Dehydration: Secondary | ICD-10-CM | POA: Diagnosis present

## 2022-05-17 DIAGNOSIS — E039 Hypothyroidism, unspecified: Secondary | ICD-10-CM | POA: Diagnosis present

## 2022-05-17 DIAGNOSIS — J189 Pneumonia, unspecified organism: Secondary | ICD-10-CM | POA: Diagnosis present

## 2022-05-17 DIAGNOSIS — Z79899 Other long term (current) drug therapy: Secondary | ICD-10-CM | POA: Diagnosis not present

## 2022-05-17 DIAGNOSIS — Z79890 Hormone replacement therapy (postmenopausal): Secondary | ICD-10-CM

## 2022-05-17 DIAGNOSIS — N3289 Other specified disorders of bladder: Secondary | ICD-10-CM | POA: Diagnosis not present

## 2022-05-17 DIAGNOSIS — R0789 Other chest pain: Principal | ICD-10-CM

## 2022-05-17 DIAGNOSIS — Z832 Family history of diseases of the blood and blood-forming organs and certain disorders involving the immune mechanism: Secondary | ICD-10-CM | POA: Diagnosis not present

## 2022-05-17 DIAGNOSIS — N179 Acute kidney failure, unspecified: Secondary | ICD-10-CM | POA: Diagnosis present

## 2022-05-17 DIAGNOSIS — K429 Umbilical hernia without obstruction or gangrene: Secondary | ICD-10-CM | POA: Diagnosis not present

## 2022-05-17 DIAGNOSIS — R079 Chest pain, unspecified: Secondary | ICD-10-CM

## 2022-05-17 DIAGNOSIS — D5701 Hb-SS disease with acute chest syndrome: Secondary | ICD-10-CM | POA: Diagnosis not present

## 2022-05-17 DIAGNOSIS — Z885 Allergy status to narcotic agent status: Secondary | ICD-10-CM | POA: Diagnosis not present

## 2022-05-17 DIAGNOSIS — I2510 Atherosclerotic heart disease of native coronary artery without angina pectoris: Secondary | ICD-10-CM | POA: Diagnosis not present

## 2022-05-17 DIAGNOSIS — J984 Other disorders of lung: Secondary | ICD-10-CM | POA: Diagnosis not present

## 2022-05-17 DIAGNOSIS — E440 Moderate protein-calorie malnutrition: Secondary | ICD-10-CM | POA: Insufficient documentation

## 2022-05-17 DIAGNOSIS — E872 Acidosis, unspecified: Secondary | ICD-10-CM | POA: Diagnosis present

## 2022-05-17 DIAGNOSIS — K7460 Unspecified cirrhosis of liver: Secondary | ICD-10-CM | POA: Diagnosis not present

## 2022-05-17 DIAGNOSIS — J90 Pleural effusion, not elsewhere classified: Secondary | ICD-10-CM | POA: Diagnosis not present

## 2022-05-17 LAB — CBC
HCT: 14.7 % — ABNORMAL LOW (ref 36.0–46.0)
Hemoglobin: 5.4 g/dL — CL (ref 12.0–15.0)
MCH: 31.8 pg (ref 26.0–34.0)
MCHC: 36.7 g/dL — ABNORMAL HIGH (ref 30.0–36.0)
MCV: 86.5 fL (ref 80.0–100.0)
Platelets: 142 10*3/uL — ABNORMAL LOW (ref 150–400)
RBC: 1.7 MIL/uL — ABNORMAL LOW (ref 3.87–5.11)
RDW: 21.7 % — ABNORMAL HIGH (ref 11.5–15.5)
WBC: 11.1 10*3/uL — ABNORMAL HIGH (ref 4.0–10.5)
nRBC: 3.5 % — ABNORMAL HIGH (ref 0.0–0.2)

## 2022-05-17 LAB — RETICULOCYTES
Immature Retic Fract: 19.4 % — ABNORMAL HIGH (ref 2.3–15.9)
RBC.: 2 MIL/uL — ABNORMAL LOW (ref 3.87–5.11)
Retic Count, Absolute: 329 10*3/uL — ABNORMAL HIGH (ref 19.0–186.0)
Retic Ct Pct: 15.7 % — ABNORMAL HIGH (ref 0.4–3.1)

## 2022-05-17 LAB — COMPREHENSIVE METABOLIC PANEL
ALT: 14 U/L (ref 0–44)
AST: 68 U/L — ABNORMAL HIGH (ref 15–41)
Albumin: 3.8 g/dL (ref 3.5–5.0)
Alkaline Phosphatase: 61 U/L (ref 38–126)
Anion gap: 9 (ref 5–15)
BUN: 12 mg/dL (ref 8–23)
CO2: 21 mmol/L — ABNORMAL LOW (ref 22–32)
Calcium: 8.9 mg/dL (ref 8.9–10.3)
Chloride: 108 mmol/L (ref 98–111)
Creatinine, Ser: 1.12 mg/dL — ABNORMAL HIGH (ref 0.44–1.00)
GFR, Estimated: 56 mL/min — ABNORMAL LOW (ref >60)
Glucose, Bld: 94 mg/dL (ref 70–99)
Potassium: 3.8 mmol/L (ref 3.5–5.1)
Sodium: 138 mmol/L (ref 135–145)
Total Bilirubin: 3.8 mg/dL — ABNORMAL HIGH (ref 0.3–1.2)
Total Protein: 7.7 g/dL (ref 6.5–8.1)

## 2022-05-17 LAB — CBC WITH DIFFERENTIAL/PLATELET
Abs Immature Granulocytes: 0.09 10*3/uL — ABNORMAL HIGH (ref 0.00–0.07)
Basophils Absolute: 0.1 10*3/uL (ref 0.0–0.1)
Basophils Relative: 1 %
Eosinophils Absolute: 0.3 10*3/uL (ref 0.0–0.5)
Eosinophils Relative: 3 %
HCT: 17.5 % — ABNORMAL LOW (ref 36.0–46.0)
Hemoglobin: 6.5 g/dL — CL (ref 12.0–15.0)
Immature Granulocytes: 1 %
Lymphocytes Relative: 19 %
Lymphs Abs: 2.2 10*3/uL (ref 0.7–4.0)
MCH: 32 pg (ref 26.0–34.0)
MCHC: 37.1 g/dL — ABNORMAL HIGH (ref 30.0–36.0)
MCV: 86.2 fL (ref 80.0–100.0)
Monocytes Absolute: 1.2 10*3/uL — ABNORMAL HIGH (ref 0.1–1.0)
Monocytes Relative: 10 %
Neutro Abs: 7.6 10*3/uL (ref 1.7–7.7)
Neutrophils Relative %: 66 %
Platelets: 188 10*3/uL (ref 150–400)
RBC: 2.03 MIL/uL — ABNORMAL LOW (ref 3.87–5.11)
RDW: 21.5 % — ABNORMAL HIGH (ref 11.5–15.5)
WBC: 11.5 10*3/uL — ABNORMAL HIGH (ref 4.0–10.5)
nRBC: 4.3 % — ABNORMAL HIGH (ref 0.0–0.2)

## 2022-05-17 LAB — LACTIC ACID, PLASMA: Lactic Acid, Venous: 0.8 mmol/L (ref 0.5–1.9)

## 2022-05-17 LAB — CREATININE, SERUM
Creatinine, Ser: 1.13 mg/dL — ABNORMAL HIGH (ref 0.44–1.00)
GFR, Estimated: 55 mL/min — ABNORMAL LOW (ref >60)

## 2022-05-17 LAB — D-DIMER, QUANTITATIVE: D-Dimer, Quant: 19.2 ug/mL-FEU — ABNORMAL HIGH (ref 0.00–0.50)

## 2022-05-17 LAB — TROPONIN I (HIGH SENSITIVITY)
Troponin I (High Sensitivity): 5 ng/L (ref <18)
Troponin I (High Sensitivity): 5 ng/L (ref <18)

## 2022-05-17 LAB — BRAIN NATRIURETIC PEPTIDE: B Natriuretic Peptide: 34.6 pg/mL (ref 0.0–100.0)

## 2022-05-17 LAB — LIPASE, BLOOD: Lipase: 111 U/L — ABNORMAL HIGH (ref 11–51)

## 2022-05-17 LAB — PREPARE RBC (CROSSMATCH)

## 2022-05-17 LAB — MRSA NEXT GEN BY PCR, NASAL: MRSA by PCR Next Gen: NOT DETECTED

## 2022-05-17 MED ORDER — ORAL CARE MOUTH RINSE
15.0000 mL | OROMUCOSAL | Status: DC
  Administered 2022-05-17 – 2022-05-21 (×9): 15 mL via OROMUCOSAL

## 2022-05-17 MED ORDER — HYDROMORPHONE HCL 1 MG/ML IJ SOLN
1.0000 mg | INTRAMUSCULAR | Status: DC | PRN
  Administered 2022-05-17: 1 mg via INTRAVENOUS
  Filled 2022-05-17: qty 1

## 2022-05-17 MED ORDER — IOHEXOL 350 MG/ML SOLN
75.0000 mL | Freq: Once | INTRAVENOUS | Status: AC | PRN
  Administered 2022-05-17: 75 mL via INTRAVENOUS

## 2022-05-17 MED ORDER — ENOXAPARIN SODIUM 40 MG/0.4ML IJ SOSY
40.0000 mg | PREFILLED_SYRINGE | INTRAMUSCULAR | Status: DC
  Administered 2022-05-17 – 2022-05-20 (×4): 40 mg via SUBCUTANEOUS
  Filled 2022-05-17 (×4): qty 0.4

## 2022-05-17 MED ORDER — SODIUM CHLORIDE 0.9% IV SOLUTION: Freq: Once | INTRAVENOUS | Status: AC

## 2022-05-17 MED ORDER — VANCOMYCIN HCL 1250 MG/250ML IV SOLN: 1250.0000 mg | INTRAVENOUS | Status: DC

## 2022-05-17 MED ORDER — VANCOMYCIN HCL IN DEXTROSE 1-5 GM/200ML-% IV SOLN
1000.0000 mg | Freq: Once | INTRAVENOUS | Status: AC
  Administered 2022-05-17: 1000 mg via INTRAVENOUS
  Filled 2022-05-17: qty 200

## 2022-05-17 MED ORDER — SODIUM CHLORIDE 0.9 % IV BOLUS
1000.0000 mL | Freq: Once | INTRAVENOUS | Status: AC
  Administered 2022-05-17: 1000 mL via INTRAVENOUS

## 2022-05-17 MED ORDER — NALOXONE HCL 0.4 MG/ML IJ SOLN: 0.4000 mg | INTRAMUSCULAR | Status: DC | PRN

## 2022-05-17 MED ORDER — SODIUM CHLORIDE 0.9 % IV SOLN
2.0000 g | Freq: Once | INTRAVENOUS | Status: AC
  Administered 2022-05-17: 2 g via INTRAVENOUS
  Filled 2022-05-17: qty 12.5

## 2022-05-17 MED ORDER — SENNOSIDES-DOCUSATE SODIUM 8.6-50 MG PO TABS
1.0000 | ORAL_TABLET | Freq: Every day | ORAL | Status: DC
  Administered 2022-05-17 – 2022-05-20 (×4): 1 via ORAL
  Filled 2022-05-17 (×4): qty 1

## 2022-05-17 MED ORDER — SODIUM CHLORIDE 0.9 % IV SOLN
1.0000 g | Freq: Once | INTRAVENOUS | Status: DC
  Filled 2022-05-17: qty 10

## 2022-05-17 MED ORDER — SODIUM CHLORIDE 0.9% FLUSH: 9.0000 mL | INTRAVENOUS | Status: DC | PRN

## 2022-05-17 MED ORDER — DIPHENHYDRAMINE HCL 25 MG PO CAPS
25.0000 mg | ORAL_CAPSULE | ORAL | Status: DC | PRN
  Administered 2022-05-20: 25 mg via ORAL
  Filled 2022-05-17: qty 1

## 2022-05-17 MED ORDER — IPRATROPIUM-ALBUTEROL 0.5-2.5 (3) MG/3ML IN SOLN
3.0000 mL | Freq: Two times a day (BID) | RESPIRATORY_TRACT | Status: DC
  Administered 2022-05-18 – 2022-05-21 (×6): 3 mL via RESPIRATORY_TRACT
  Filled 2022-05-17 (×7): qty 3

## 2022-05-17 MED ORDER — PROCHLORPERAZINE EDISYLATE 10 MG/2ML IJ SOLN
5.0000 mg | Freq: Four times a day (QID) | INTRAMUSCULAR | Status: DC | PRN
  Administered 2022-05-18 – 2022-05-19 (×3): 5 mg via INTRAVENOUS
  Filled 2022-05-17 (×3): qty 2

## 2022-05-17 MED ORDER — CHLORHEXIDINE GLUCONATE CLOTH 2 % EX PADS
6.0000 | MEDICATED_PAD | Freq: Every day | CUTANEOUS | Status: DC
  Administered 2022-05-17 – 2022-05-21 (×5): 6 via TOPICAL

## 2022-05-17 MED ORDER — ONDANSETRON HCL 4 MG/2ML IJ SOLN
4.0000 mg | Freq: Once | INTRAMUSCULAR | Status: DC
  Filled 2022-05-17: qty 2

## 2022-05-17 MED ORDER — HYDROMORPHONE 1 MG/ML IV SOLN
INTRAVENOUS | Status: DC
  Administered 2022-05-18 (×2): 0.5 mg via INTRAVENOUS
  Filled 2022-05-17 (×11): qty 30

## 2022-05-17 MED ORDER — POLYETHYLENE GLYCOL 3350 17 G PO PACK: 17.0000 g | PACK | Freq: Every day | ORAL | Status: DC | PRN

## 2022-05-17 MED ORDER — SODIUM CHLORIDE 0.45 % IV SOLN: INTRAVENOUS | Status: AC

## 2022-05-17 MED ORDER — MORPHINE SULFATE (PF) 4 MG/ML IV SOLN
4.0000 mg | Freq: Once | INTRAVENOUS | Status: AC
  Administered 2022-05-17: 4 mg via INTRAVENOUS
  Filled 2022-05-17: qty 1

## 2022-05-17 MED ORDER — ORAL CARE MOUTH RINSE: 15.0000 mL | OROMUCOSAL | Status: DC | PRN

## 2022-05-17 MED ORDER — SODIUM CHLORIDE 0.9 % IV SOLN: 2.0000 g | Freq: Two times a day (BID) | INTRAVENOUS | Status: DC

## 2022-05-17 MED ORDER — PROCHLORPERAZINE EDISYLATE 10 MG/2ML IJ SOLN
10.0000 mg | INTRAMUSCULAR | Status: AC
  Administered 2022-05-17: 10 mg via INTRAVENOUS
  Filled 2022-05-17: qty 2

## 2022-05-17 MED ORDER — MELATONIN 5 MG PO TABS
5.0000 mg | ORAL_TABLET | Freq: Every evening | ORAL | Status: DC | PRN
  Administered 2022-05-17: 5 mg via ORAL
  Filled 2022-05-17: qty 1

## 2022-05-17 MED ORDER — IPRATROPIUM-ALBUTEROL 0.5-2.5 (3) MG/3ML IN SOLN
3.0000 mL | Freq: Four times a day (QID) | RESPIRATORY_TRACT | Status: DC
  Administered 2022-05-17: 3 mL via RESPIRATORY_TRACT
  Filled 2022-05-17: qty 3

## 2022-05-17 NOTE — H&P (Signed)
History and Physical  [name] BPZ:[id number] DOB: 1960-07-31 DOA: 05/17/2022  Referring physician: Dr. Wyvonnia Dusky, [place]. PCP: Dorena Dew, FNP  Outpatient Specialists: [facility]. Patient coming from: Home.  Chief Complaint: Chest pain.  HPI: [name] is a 61 y.o. female with medical history significant for sickle cell disease, chronic pain syndrome, rheumatoid arthritis, vitamin D deficiency, hepatitis C, hypothyroidism, anemia of chronic disease, previous brain aneurysm, history of Coley lithiasis status post cholecystectomy, presented to the [facility] ED from home due to right-sided chest pain, involving her right ribs and right upper abdominal pain with onset about 12 PM today and has been constant since.  The pain has become progressively worse not improved with Tylenol at home.  Endorses similar pain a week ago which resolved with Tylenol.  Pain is worse with taking a deep breath.  Denies nausea or vomiting.  No subjective fevers.    In the ED, work-up revealed concern for acute chest syndrome with CT angio chest abdomen pelvis negative for pulmonary embolism or aneurysm.  Revealing infiltrates, Bibasilar subpleural airspace density progressed since the prior CT and may represent progression of interstitial lung disease.  Superimposed pneumonia is not excluded.  Tmax 99.6 in the ED.  Hemoglobin 6.5.  She was started on broad-spectrum IV antibiotics cefepime and IV vancomycin.  1 unit PRBC ordered to be transfused.  IV fluid hydration.  IV opioids based analgesics.  TRH, hospitalist service was asked to admit.  ED Course: Tmax 99.6.  BP 118/63.  Pulse 59.  Respiratory 11.  O2 saturation 96% on room air.  Lab studies remarkable for WBC 11.5, hemoglobin 6.5, MCV 86, platelet count 188, serum bicarb 21, creatinine 1.1, lipase 111, AST 68, ALT 14, total bilirubin 3.8, GFR 56.  Review of Systems: Review of systems as noted in the HPI. All other systems reviewed and are  negative.   Past Medical History:  Diagnosis Date   Aneurysm ([place])    Anxiety    Arthritis    Colon ulcer    Gall stones    Hypokalemia    Hypothyroidism    Pneumonia    Sickle cell anemia ([place])    Ulcers of both lower extremities [facility])    Past Surgical History:  Procedure Laterality Date   ANKLE SURGERY  1989 and 1990   CEREBRAL ANEURYSM REPAIR     COLONOSCOPY  05/12/2012   Procedure: COLONOSCOPY;  Surgeon: Lafayette Dragon, MD;  Location: [facility] ENDOSCOPY;  Service: Endoscopy;  Laterality: N/A;   ESOPHAGOGASTRODUODENOSCOPY  05/12/2012   Procedure: ESOPHAGOGASTRODUODENOSCOPY (EGD);  Surgeon: Lafayette Dragon, MD;  Location: [facility];  Service: Endoscopy;  Laterality: N/A;   gall stone removal     GIVENS CAPSULE STUDY  05/13/2012   Procedure: GIVENS CAPSULE STUDY;  Surgeon: Lafayette Dragon, MD;  Location: [facility] ENDOSCOPY;  Service: Endoscopy;  Laterality: N/A;    Social History:  reports that she has never smoked. She has never used smokeless tobacco. She reports current alcohol use. She reports that she does not use drugs.   Allergies  Allergen Reactions   Ampicillin Other (See Comments)    Caused red bumps on the skin, but nothing else Per patient, she tolerates cephalosporins without problems (??)   Demerol [Meperidine] Other (See Comments)    A high(er) dose caused seizure(s)    Family History  Problem Relation Age of Onset   Cancer Mother 81       Esophageal   Sickle cell trait Mother  Sickle cell trait Father    HIV/AIDS Brother    Colon cancer Neg Hx       Prior to Admission medications   Medication Sig Start Date End Date Taking? Authorizing Provider  acetaminophen (TYLENOL) 650 MG CR tablet Take 1,300 mg by mouth every 8 (eight) hours as needed (arthritis pain).    [provider]  albuterol (VENTOLIN HFA) 108 (90 Base) MCG/ACT inhaler Inhale 1 puff into the lungs 2 (two) times daily. 02/29/20   [provider]  ASCORBIC ACID PO Take 1 tablet by  mouth every morning. Patient not taking: Reported on 03/27/2022    [provider]  Cholecalciferol (VITAMIN D3 PO) Take 1 tablet by mouth every morning.    [provider]  cyclobenzaprine (FLEXERIL) 10 MG tablet Take 1 tablet (10 mg total) by mouth at bedtime. Ran out 12/26/21   Massie Maroon, FNP  DULoxetine (CYMBALTA) 30 MG capsule Take 1 capsule (30 mg total) by mouth daily. 12/26/21   Massie Maroon, FNP  folic acid (FOLVITE) 1 MG tablet Take 1 tablet (1 mg total) by mouth daily. 12/26/21   Massie Maroon, FNP  FOLIC ACID PO Take 1 tablet by mouth 2 (two) times daily.    [provider]  gabapentin (NEURONTIN) 300 MG capsule Take 300 mg by mouth daily as needed (nerve pain).    [provider]  hydrOXYzine (ATARAX) 10 MG tablet Take 1 tablet (10 mg total) by mouth 3 (three) times daily as needed. 03/27/22   Massie Maroon, FNP  ibuprofen (ADVIL) 800 MG tablet Take 1 tablet (800 mg total) by mouth every 8 (eight) hours as needed. 01/15/22   Ivonne Andrew, NP  levothyroxine (SYNTHROID) 200 MCG tablet Take 200 mcg by mouth daily before breakfast.    [provider]  montelukast (SINGULAIR) 10 MG tablet Take 10 mg by mouth at bedtime as needed (allergies/congestion).    [provider]  Omega-3 Fatty Acids (FISH OIL PO) Take 1 capsule by mouth every morning.    [provider]    Physical Exam: BP 118/63   Pulse (!) 59   Temp 99.6 F (37.6 C) (Rectal)   Resp 11   Ht 5\' 11"  (1.803 m)   Wt 65.8 kg   SpO2 96%   BMI 20.22 kg/m   General: 61 y.o. year-old female well developed well nourished in no acute distress.  Alert and oriented x3. Cardiovascular: Regular rate and rhythm with no rubs or gallops.  No thyromegaly or JVD noted.  Trace lower extremity edema. 2/4 pulses in all 4 extremities. Respiratory: Mild rales at bases.  Poor inspiratory effort. Abdomen: Soft nontender nondistended with normal bowel sounds x4  quadrants. Muskuloskeletal: No cyanosis or clubbing.  Trace edema noted bilaterally Neuro: CN II-XII intact, strength, sensation, reflexes Skin: No ulcerative lesions noted or rashes Psychiatry: Judgement and insight appear normal. Mood is appropriate for condition and setting          Labs on Admission:  Basic Metabolic Panel: Recent Labs  Lab 05/13/22 1757 05/17/22 1456  NA 139 138  K 4.1 3.8  CL 109 108  CO2 23 21*  GLUCOSE 89 94  BUN 12 12  CREATININE 1.18* 1.12*  CALCIUM 8.7* 8.9   Liver Function Tests: Recent Labs  Lab 05/13/22 1757 05/17/22 1456  AST 72* 68*  ALT 14 14  ALKPHOS 61 61  BILITOT 3.5* 3.8*  PROT 7.7 7.7  ALBUMIN 3.7 3.8  Recent Labs  Lab 05/17/22 1456  LIPASE 111*   No results for input(s): "AMMONIA" in the last 168 hours. CBC: Recent Labs  Lab 05/13/22 1757 05/17/22 1456  WBC 9.6 11.5*  NEUTROABS 4.6 7.6  HGB 5.6* 6.5*  HCT 15.8* 17.5*  MCV 90.3 86.2  PLT 175 188   Cardiac Enzymes: No results for input(s): "CKTOTAL", "CKMB", "CKMBINDEX", "TROPONINI" in the last 168 hours.  BNP (last 3 results) Recent Labs    05/13/22 1942 05/17/22 1456  BNP 80.4 34.6    ProBNP (last 3 results) No results for input(s): "PROBNP" in the last 8760 hours.  CBG: No results for input(s): "GLUCAP" in the last 168 hours.  Radiological Exams on Admission: CT Angio Chest/Abd/Pel for Dissection W and/or Wo Contrast  Result Date: 05/17/2022 CLINICAL DATA:  Acute aortic syndrome suspected. EXAM: CT ANGIOGRAPHY CHEST, ABDOMEN AND PELVIS TECHNIQUE: Non-contrast CT of the chest was initially obtained. Multidetector CT imaging through the chest, abdomen and pelvis was performed using the standard protocol during bolus administration of intravenous contrast. Multiplanar reconstructed images and MIPs were obtained and reviewed to evaluate the vascular anatomy. RADIATION DOSE REDUCTION: This exam was performed according to the departmental dose-optimization  program which includes automated exposure control, adjustment of the mA and/or kV according to patient size and/or use of iterative reconstruction technique. CONTRAST:  77mL OMNIPAQUE IOHEXOL 350 MG/ML SOLN COMPARISON:  Chest CT dated 01/29/2020. FINDINGS: CTA CHEST FINDINGS Cardiovascular: Borderline cardiomegaly. Trace pericardial effusion measuring approximately 5 mm in thickness anterior to the heart. Mild atherosclerotic calcification of the aortic arch. No aneurysmal dilatation or dissection. The origins of the great vessels of the aortic arch appear patent. No pulmonary artery embolus identified. Mediastinum/Nodes: No hilar or mediastinal adenopathy. The esophagus is grossly unremarkable. No mediastinal fluid collection. Lungs/Pleura: Bibasilar subpleural airspace densities, right greater than left, have progressed since the prior CT and may represent progression of interstitial lung disease. Superimposed pneumonia is not excluded. Clinical correlation is recommended. Similar appearance of irregular nodule in the right apex, likely post inflammatory scarring. There is no pleural effusion or pneumothorax. The central airways are patent. Musculoskeletal: Osteopenia.  No acute osseous pathology. Review of the MIP images confirms the above findings. CTA ABDOMEN AND PELVIS FINDINGS VASCULAR Aorta: Normal caliber aorta without aneurysm, dissection, vasculitis or significant stenosis. Celiac: High-grade narrowing of the proximal celiac axis may be related to compression by diaphragmatic crus. The celiac axis and its major branches remain patent. SMA: Patent without evidence of aneurysm, dissection, vasculitis or significant stenosis. Renals: Both renal arteries are patent without evidence of aneurysm, dissection, vasculitis, fibromuscular dysplasia or significant stenosis. IMA: Patent without evidence of aneurysm, dissection, vasculitis or significant stenosis. Inflow: Patent without evidence of aneurysm,  dissection, vasculitis or significant stenosis. Veins: No obvious venous abnormality within the limitations of this arterial phase study. Review of the MIP images confirms the above findings. NON-VASCULAR No intra-abdominal free air or free fluid. Hepatobiliary: Cirrhosis. Mild dilatation of the biliary tree, likely post cholecystectomy. No retained calcified stone noted in the central CBD. Pancreas: Unremarkable. No pancreatic ductal dilatation or surrounding inflammatory changes. Spleen: The spleen is not visualized and may be surgically absent or related to other splenectomy. Adrenals/Urinary Tract: The adrenal glands, kidneys, visualized ureters appear unremarkable. The urinary bladder is distended. Stomach/Bowel: There is moderate stool throughout the colon. There is no bowel obstruction or active inflammation. The appendix is not visualized with certainty. No inflammatory changes identified in the right lower quadrant. Lymphatic: No adenopathy. Reproductive:  The uterus is grossly unremarkable. No adnexal masses. Other: Small fat containing umbilical hernia. Musculoskeletal: Osteopenia with degenerative changes of the spine. No acute osseous pathology. Review of the MIP images confirms the above findings. IMPRESSION: 1. No aortic aneurysm or dissection. 2. Bibasilar subpleural airspace densities progressed since the prior CT and may represent progression of interstitial lung disease. Superimposed pneumonia is not excluded. 3. Cirrhosis. 4. No bowel obstruction. 5.  Aortic Atherosclerosis (ICD10-I70.0). Electronically Signed   By: Elgie Collard M.D.   On: 05/17/2022 18:49   DG Chest 2 View  Result Date: 05/17/2022 CLINICAL DATA:  chest pain EXAM: CHEST - 2 VIEW COMPARISON:  Chest x-ray 05/13/2022, CT chest 01/29/2020 FINDINGS: Persistent mild cardiomegaly. The heart and mediastinal contours are unchanged. Aortic calcification. No focal consolidation. Mild pulmonary edema. No pleural effusion. No  pneumothorax. No acute osseous abnormality. IMPRESSION: Mild pulmonary edema. Electronically Signed   By: Tish Frederickson M.D.   On: 05/17/2022 15:30    EKG: I independently viewed the EKG done and my findings are as followed: Normal normal sinus rhythm rate of 62.  Nonspecific ST-T changes.  QTc 566.  Assessment/Plan Present on Admission:  Chest pain  Principal Problem:   Chest pain  Possible acute chest pain syndrome in the setting of sickle cell disease High-sensitivity troponin negative x2, no evidence of acute ischemia on twelve-lead EKG Abnormal CT chest, CT angio chest abdomen pelvis negative for pulmonary embolism or aneurysm.  Revealing infiltrates, Bibasilar subpleural airspace density progressed since the prior CT and may represent progression of interstitial lung disease.  Superimposed pneumonia is not excluded.   IV opiate-based analgesics IV antibiotics cefepime and IV vancomycin IV fluid hydration Incentive spirometer, bronchodilators.  Sickle cell disease, anemia of chronic disease Presented with hemoglobin of 6.5 1 unit PRBC ordered to be transfused We will get FOBT Repeat CBC in the morning  Mild non-anion gap metabolic acidosis Serum bicarb 21, anion gap 9 IV fluid hydration half-normal saline at 75 cc/h x 2 days.  AKI, prerenal in the setting of dehydration Baseline creatinine appears to be 0.5 with GFR greater than 60 Presented with creatinine 1.12 GFR 56 Avoid nephrotoxic agents, hypotension, and dehydration. Monitor urine output with strict I's and O's  Elevated AST, elevated T. bili in the setting of sickle cell possible early crisis Avoid hepatotoxic agents Monitor and trend levels     DVT prophylaxis: Subcu Lovenox daily  Code Status: Full code  Family Communication: Updated daughter at bedside  Disposition Plan: Admitted to stepdown unit as inpatient status.  Consults called: None  Admission status: Inpatient status.   Status is:  Inpatient status. The patient requires at least 2 midnights for further evaluation and treatment of present condition.   Darlin Drop MD [facility] Pager [phone]  If 7PM-7AM, please contact night-coverage [email or web address] Password [facility]  05/17/2022, 8:36 PM

## 2022-05-17 NOTE — ED Provider Triage Note (Signed)
Emergency Medicine Provider Triage Evaluation Note  KEYLEIGH MANNINEN , a 61 y.o. female  was evaluated in triage.  Pt complains of right-sided chest wall pain, as well as right upper quadrant abdominal pain.  She states she had similar pain last week which improved with Tylenol.  States it is worse with breathing or movement.  Denies cough.  Review of Systems  Positive: As above Negative: As above  Physical Exam  BP 97/64 (BP Location: Left Arm)   Pulse 75   Temp 98.5 F (36.9 C)   Resp 17   Ht 5\' 11"  (1.803 m)   Wt 65.8 kg   SpO2 100%   BMI 20.22 kg/m  Gen:   Awake, no distress   Resp:  Normal effort  MSK:   Moves extremities without difficulty  Other:    Medical Decision Making  Medically screening exam initiated at 2:49 PM.  Appropriate orders placed.  GDJMEQ ULYANA PITONES was informed that the remainder of the evaluation will be completed by another provider, this initial triage assessment does not replace that evaluation, and the importance of remaining in the ED until their evaluation is complete.    Evlyn Courier, PA-C 05/17/22 1450

## 2022-05-17 NOTE — ED Provider Notes (Signed)
[name]   CSN: [id number] Arrival date & time: 05/17/22  1434     History  Chief Complaint  Patient presents with   Chest Pain    [name] is a 61 y.o. female.  Patient with a history of sickle cell anemia, previous brain aneurysm, gallstones status postcholecystectomy presenting with right-sided chest pain and upper abdominal pain that onset today about 12 PM and has been constant.  She is quite uncomfortable.  States this pain started suddenly about 5 hours ago and has been gotten progressively worse not improved with Tylenol at home.  Had similar pain about a week ago which resolved on its own after several hours and Tylenol.  Pain is associated with pain with breathing.  No nausea, vomiting, cough or fever.  Pain is involving her right ribs and right upper abdomen.  Does not feel consistent with her sickle cell pain. No recent black or bloody stools.  No history of ulcers or gastritis.  No vomiting.  No fever.  Previous cholecystectomy.  No rash.  The history is provided by the patient.  Chest Pain Associated symptoms: abdominal pain and back pain   Associated symptoms: no headache, no nausea, no vomiting and no weakness        Home Medications Prior to Admission medications   Medication Sig Start Date End Date Taking? Authorizing Provider  acetaminophen (TYLENOL) 650 MG CR tablet Take 1,300 mg by mouth every 8 (eight) hours as needed (arthritis pain).    [provider]  albuterol (VENTOLIN HFA) 108 (90 Base) MCG/ACT inhaler Inhale 1 puff into the lungs 2 (two) times daily. 02/29/20   [provider]  ASCORBIC ACID PO Take 1 tablet by mouth every morning. Patient not taking: Reported on 03/27/2022    [provider]  Cholecalciferol (VITAMIN D3 PO) Take 1 tablet by mouth every morning.    [provider]  cyclobenzaprine (FLEXERIL) 10 MG tablet Take 1 tablet (10 mg total) by mouth at  bedtime. Ran out 12/26/21   Dorena Dew, FNP  DULoxetine (CYMBALTA) 30 MG capsule Take 1 capsule (30 mg total) by mouth daily. 12/26/21   Dorena Dew, FNP  folic acid (FOLVITE) 1 MG tablet Take 1 tablet (1 mg total) by mouth daily. 12/26/21   Dorena Dew, FNP  FOLIC ACID PO Take 1 tablet by mouth 2 (two) times daily.    [provider]  gabapentin (NEURONTIN) 300 MG capsule Take 300 mg by mouth daily as needed (nerve pain).    [provider]  hydrOXYzine (ATARAX) 10 MG tablet Take 1 tablet (10 mg total) by mouth 3 (three) times daily as needed. 03/27/22   Dorena Dew, FNP  ibuprofen (ADVIL) 800 MG tablet Take 1 tablet (800 mg total) by mouth every 8 (eight) hours as needed. 01/15/22   Fenton Foy, NP  levothyroxine (SYNTHROID) 200 MCG tablet Take 200 mcg by mouth daily before breakfast.    [provider]  montelukast (SINGULAIR) 10 MG tablet Take 10 mg by mouth at bedtime as needed (allergies/congestion).    [provider]  Omega-3 Fatty Acids (FISH OIL PO) Take 1 capsule by mouth every morning.    [provider]      Allergies    Ampicillin and Demerol [meperidine]    Review of Systems   Review of Systems  Constitutional:  Negative for activity change and appetite change.  HENT:  Negative for congestion  and rhinorrhea.   Respiratory:  Positive for chest tightness.   Cardiovascular:  Positive for chest pain.  Gastrointestinal:  Positive for abdominal pain. Negative for nausea and vomiting.  Genitourinary:  Negative for dysuria and hematuria.  Musculoskeletal:  Positive for back pain.  Skin:  Negative for rash.  Neurological:  Negative for weakness and headaches.    all other systems are negative except as noted in the HPI and PMH.   Physical Exam Updated Vital Signs BP 97/64 (BP Location: Left Arm)   Pulse 75   Temp 98.5 F (36.9 C)   Resp 17   Ht 5\' 11"  (1.803 m)   Wt 65.8 kg   SpO2 100%   BMI 20.22 kg/m   Physical Exam Vitals and nursing note reviewed.  Constitutional:      General: She is not in acute distress.    Appearance: She is well-developed.     Comments: Uncomfortable  HENT:     Head: Normocephalic and atraumatic.     Mouth/Throat:     Pharynx: No oropharyngeal exudate.  Eyes:     Conjunctiva/sclera: Conjunctivae normal.     Pupils: Pupils are equal, round, and reactive to light.  Neck:     Comments: No meningismus. Cardiovascular:     Rate and Rhythm: Normal rate and regular rhythm.     Heart sounds: Normal heart sounds. No murmur heard. Pulmonary:     Effort: Pulmonary effort is normal. No respiratory distress.     Breath sounds: Normal breath sounds.     Comments: Right rib tenderness underneath right breast.  [name] present as chaperone.  No rash Chest:     Chest wall: Tenderness present.  Abdominal:     Palpations: Abdomen is soft.     Tenderness: There is abdominal tenderness. There is no guarding or rebound.     Comments: Periumbilical and right upper quadrant tenderness, well-healed cholecystectomy scar  Musculoskeletal:        General: No tenderness. Normal range of motion.     Cervical back: Normal range of motion and neck supple.  Skin:    General: Skin is warm.  Neurological:     Mental Status: She is alert and oriented to person, place, and time.     Cranial Nerves: No cranial nerve deficit.     Motor: No abnormal muscle tone.     Coordination: Coordination normal.     Comments:  5/5 strength throughout. CN 2-12 intact.Equal grip strength.   Psychiatric:        Behavior: Behavior normal.     ED Results / Procedures / Treatments   Labs (all labs ordered are listed, but only abnormal results are displayed) Labs Reviewed  CBC WITH DIFFERENTIAL/PLATELET - Abnormal; Notable for the following components:      Result Value   WBC 11.5 (*)    RBC 2.03 (*)    Hemoglobin 6.5 (*)    HCT 17.5 (*)    MCHC 37.1 (*)    RDW 21.5 (*)    nRBC 4.3 (*)     Monocytes Absolute 1.2 (*)    Abs Immature Granulocytes 0.09 (*)    All other components within normal limits  COMPREHENSIVE METABOLIC PANEL - Abnormal; Notable for the following components:   CO2 21 (*)    Creatinine, Ser 1.12 (*)    AST 68 (*)    Total Bilirubin 3.8 (*)    GFR, Estimated 56 (*)    All other components within normal limits  LIPASE, BLOOD - Abnormal; Notable for the following components:   Lipase 111 (*)    All other components within normal limits  RETICULOCYTES - Abnormal; Notable for the following components:   Retic Ct Pct 15.7 (*)    RBC. 2.00 (*)    Retic Count, Absolute 329.0 (*)    Immature Retic Fract 19.4 (*)    All other components within normal limits  D-DIMER, QUANTITATIVE (NOT AT [facility]) - Abnormal; Notable for the following components:   D-Dimer, Quant 19.20 (*)    All other components within normal limits  CULTURE, BLOOD (ROUTINE X 2)  CULTURE, BLOOD (ROUTINE X 2)  BRAIN NATRIURETIC PEPTIDE  LACTIC ACID, PLASMA  LACTIC ACID, PLASMA  TROPONIN I (HIGH SENSITIVITY)  TROPONIN I (HIGH SENSITIVITY)    EKG EKG Interpretation  Date/Time:  Thursday May 17 2022 14:44:46 EDT Ventricular Rate:  75 PR Interval:  179 QRS Duration: 86 QT Interval:  538 QTC Calculation: 602 R Axis:   30 Text Interpretation: Sinus rhythm Low voltage, extremity leads Nonspecific T abnormalities, lateral leads Prolonged QT interval Nonspecific T wave abnormality Confirmed by Glynn Octave [phone]) on 05/17/2022 4:42:30 PM  Radiology CT Angio Chest/Abd/Pel for Dissection W and/or Wo Contrast  Result Date: 05/17/2022 CLINICAL DATA:  Acute aortic syndrome suspected. EXAM: CT ANGIOGRAPHY CHEST, ABDOMEN AND PELVIS TECHNIQUE: Non-contrast CT of the chest was initially obtained. Multidetector CT imaging through the chest, abdomen and pelvis was performed using the standard protocol during bolus administration of intravenous contrast. Multiplanar reconstructed images and MIPs  were obtained and reviewed to evaluate the vascular anatomy. RADIATION DOSE REDUCTION: This exam was performed according to the departmental dose-optimization program which includes automated exposure control, adjustment of the mA and/or kV according to patient size and/or use of iterative reconstruction technique. CONTRAST:  50mL OMNIPAQUE IOHEXOL 350 MG/ML SOLN COMPARISON:  Chest CT dated 01/29/2020. FINDINGS: CTA CHEST FINDINGS Cardiovascular: Borderline cardiomegaly. Trace pericardial effusion measuring approximately 5 mm in thickness anterior to the heart. Mild atherosclerotic calcification of the aortic arch. No aneurysmal dilatation or dissection. The origins of the great vessels of the aortic arch appear patent. No pulmonary artery embolus identified. Mediastinum/Nodes: No hilar or mediastinal adenopathy. The esophagus is grossly unremarkable. No mediastinal fluid collection. Lungs/Pleura: Bibasilar subpleural airspace densities, right greater than left, have progressed since the prior CT and may represent progression of interstitial lung disease. Superimposed pneumonia is not excluded. Clinical correlation is recommended. Similar appearance of irregular nodule in the right apex, likely post inflammatory scarring. There is no pleural effusion or pneumothorax. The central airways are patent. Musculoskeletal: Osteopenia.  No acute osseous pathology. Review of the MIP images confirms the above findings. CTA ABDOMEN AND PELVIS FINDINGS VASCULAR Aorta: Normal caliber aorta without aneurysm, dissection, vasculitis or significant stenosis. Celiac: High-grade narrowing of the proximal celiac axis may be related to compression by diaphragmatic crus. The celiac axis and its major branches remain patent. SMA: Patent without evidence of aneurysm, dissection, vasculitis or significant stenosis. Renals: Both renal arteries are patent without evidence of aneurysm, dissection, vasculitis, fibromuscular dysplasia or  significant stenosis. IMA: Patent without evidence of aneurysm, dissection, vasculitis or significant stenosis. Inflow: Patent without evidence of aneurysm, dissection, vasculitis or significant stenosis. Veins: No obvious venous abnormality within the limitations of this arterial phase study. Review of the MIP images confirms the above findings. NON-VASCULAR No intra-abdominal free air or free fluid. Hepatobiliary: Cirrhosis. Mild dilatation of the biliary tree, likely post cholecystectomy. No retained calcified stone noted in the central  CBD. Pancreas: Unremarkable. No pancreatic ductal dilatation or surrounding inflammatory changes. Spleen: The spleen is not visualized and may be surgically absent or related to other splenectomy. Adrenals/Urinary Tract: The adrenal glands, kidneys, visualized ureters appear unremarkable. The urinary bladder is distended. Stomach/Bowel: There is moderate stool throughout the colon. There is no bowel obstruction or active inflammation. The appendix is not visualized with certainty. No inflammatory changes identified in the right lower quadrant. Lymphatic: No adenopathy. Reproductive: The uterus is grossly unremarkable. No adnexal masses. Other: Small fat containing umbilical hernia. Musculoskeletal: Osteopenia with degenerative changes of the spine. No acute osseous pathology. Review of the MIP images confirms the above findings. IMPRESSION: 1. No aortic aneurysm or dissection. 2. Bibasilar subpleural airspace densities progressed since the prior CT and may represent progression of interstitial lung disease. Superimposed pneumonia is not excluded. 3. Cirrhosis. 4. No bowel obstruction. 5.  Aortic Atherosclerosis (ICD10-I70.0). Electronically Signed   By: Elgie Collard M.D.   On: 05/17/2022 18:49   DG Chest 2 View  Result Date: 05/17/2022 CLINICAL DATA:  chest pain EXAM: CHEST - 2 VIEW COMPARISON:  Chest x-ray 05/13/2022, CT chest 01/29/2020 FINDINGS: Persistent mild  cardiomegaly. The heart and mediastinal contours are unchanged. Aortic calcification. No focal consolidation. Mild pulmonary edema. No pleural effusion. No pneumothorax. No acute osseous abnormality. IMPRESSION: Mild pulmonary edema. Electronically Signed   By: Tish Frederickson M.D.   On: 05/17/2022 15:30    Procedures Procedures    Medications Ordered in ED Medications  morphine (PF) 4 MG/ML injection 4 mg (has no administration in time range)  ondansetron (ZOFRAN) injection 4 mg (has no administration in time range)    ED Course/ Medical Decision Making/ A&P                           Medical Decision Making Amount and/or Complexity of Data Reviewed Labs: ordered. Decision-making details documented in ED Course. Radiology: ordered and independent interpretation performed. Decision-making details documented in ED Course. ECG/medicine tests: ordered and independent interpretation performed. Decision-making details documented in ED Course.  Risk Prescription drug management. Decision regarding hospitalization.   Presenting chest and upper abdominal pain for the past 5 hours.  Vitals are stable, no distress.  EKG shows nonspecific ST depressions but no acute ischemia.  X-ray obtained in triage is negative for infiltrate.  Results reviewed interpreted by me.  Hemoglobin today is 6.5 which is within the range of her baseline.  LFTs ar at baseline with slightly elevated bilirubin.  Lipase 111.  Troponin negative, low suspicion for ACS.  Considered pulmonary embolism, pneumonia, pancreatitis, gastritis, esophagitis  EKG without acute ischemia.  Troponin negative x2.  D-dimer elevated at 19.  CTA shows no aortic aneurysm or aortic dissection. No pulmonary embolism identified either.  CTA results discussed with Dr. Gwenyth Bender of radiology.  There is some airspace disease to the right lung base which is progressed from previous.  Unclear if this represents pneumonia.  It is in the area of  patient's pain.  Given her sickle cell history there is some concern for acute chest syndrome.  Tmax here 99.6.  No fever at home and no cough.  Still having significant right-sided rib pain.  No pulmonary embolism or aortic dissection.  We will plan admission for suspected acute chest syndrome.  Broad Spectrum antibiotics started after cultures obtained.  Plan remains negative.  No hypoxia or increased work of breathing.  Borderline temperature.  No cough.  Plan admission for  suspected acute chest syndrome. Discussed with Dr. Margo Aye.       Final Clinical Impression(s) / ED Diagnoses Final diagnoses:  Atypical chest pain  Acute chest syndrome [facility])    Rx / DC Orders ED Discharge Orders     None         Robertine Kipper, Jeannett Senior, MD 05/17/22 1955

## 2022-05-17 NOTE — Progress Notes (Signed)
Pharmacy Antibiotic Note  [name] is a 61 y.o. female admitted on 05/17/2022 with chest pain, concern for pneumonia. Pharmacy has been consulted for vancomycin dosing.  Plan: -Vancomycin 1000 mg to be given in the ED. Follow with vanc 1250 mg IV q24h -Cefepime per MD -Continue to follow renal function, cultures and clinical progress for dose adjustments and de-escalation as indicated  Height: 5\' 11"  (180.3 cm) Weight: 65.8 kg (145 lb) IBW/kg (Calculated) : 70.8  Temp (24hrs), Avg:99.1 F (37.3 C), Min:98.5 F (36.9 C), Max:99.6 F (37.6 C)  Recent Labs  Lab 05/13/22 1757 05/17/22 1456  WBC 9.6 11.5*  CREATININE 1.18* 1.12*    Estimated Creatinine Clearance: 54.1 mL/min (A) (by C-G formula based on SCr of 1.12 mg/dL (H)).    Allergies  Allergen Reactions   Ampicillin Other (See Comments)    Caused red bumps on the skin, but nothing else Per patient, she tolerates cephalosporins without problems (??)   Demerol [Meperidine] Other (See Comments)    A high(er) dose caused seizure(s)    Antimicrobials this admission: Cefepime 10/26 >> Vancomycin 10/26 >>  Dose adjustments this admission: NA  Microbiology results: 10/26 BCx: pending 10/26 MRSA PCR: ordered  Thank you for allowing pharmacy to be a part of this patient's care.  Tawnya Crook, PharmD, BCPS Clinical Pharmacist 05/17/2022 8:40 PM

## 2022-05-17 NOTE — ED Triage Notes (Signed)
Rt sided chest pain that goes down her side. No other symptoms. Pain started 1130 this am.

## 2022-05-18 ENCOUNTER — Inpatient Hospital Stay (HOSPITAL_COMMUNITY): Payer: Medicare Other

## 2022-05-18 DIAGNOSIS — R079 Chest pain, unspecified: Secondary | ICD-10-CM | POA: Diagnosis not present

## 2022-05-18 DIAGNOSIS — R071 Chest pain on breathing: Secondary | ICD-10-CM

## 2022-05-18 DIAGNOSIS — E440 Moderate protein-calorie malnutrition: Secondary | ICD-10-CM | POA: Insufficient documentation

## 2022-05-18 LAB — COMPREHENSIVE METABOLIC PANEL
ALT: 13 U/L (ref 0–44)
AST: 62 U/L — ABNORMAL HIGH (ref 15–41)
Albumin: 3.4 g/dL — ABNORMAL LOW (ref 3.5–5.0)
Alkaline Phosphatase: 53 U/L (ref 38–126)
Anion gap: 6 (ref 5–15)
BUN: 12 mg/dL (ref 8–23)
CO2: 22 mmol/L (ref 22–32)
Calcium: 8.6 mg/dL — ABNORMAL LOW (ref 8.9–10.3)
Chloride: 108 mmol/L (ref 98–111)
Creatinine, Ser: 0.82 mg/dL (ref 0.44–1.00)
GFR, Estimated: >60 mL/min (ref >60)
Glucose, Bld: 109 mg/dL — ABNORMAL HIGH (ref 70–99)
Potassium: 3.5 mmol/L (ref 3.5–5.1)
Sodium: 136 mmol/L (ref 135–145)
Total Bilirubin: 2.9 mg/dL — ABNORMAL HIGH (ref 0.3–1.2)
Total Protein: 7.5 g/dL (ref 6.5–8.1)

## 2022-05-18 LAB — ECHOCARDIOGRAM COMPLETE
Area-P 1/2: 2.83 cm2
Calc EF: 59.3 %
Height: 71 in
MV M vel: 5.4 m/s
MV Peak grad: 116.4 mmHg
Radius: 0.5 cm
S' Lateral: 3.3 cm
Single Plane A2C EF: 61.1 %
Single Plane A4C EF: 59.9 %
Weight: 2310.42 oz

## 2022-05-18 LAB — HEMOGLOBIN AND HEMATOCRIT, BLOOD
HCT: 18.7 % — ABNORMAL LOW (ref 36.0–46.0)
HCT: 20.3 % — ABNORMAL LOW (ref 36.0–46.0)
Hemoglobin: 6.8 g/dL — CL (ref 12.0–15.0)
Hemoglobin: 7.2 g/dL — ABNORMAL LOW (ref 12.0–15.0)

## 2022-05-18 LAB — PREPARE RBC (CROSSMATCH)

## 2022-05-18 LAB — PHOSPHORUS: Phosphorus: 3.6 mg/dL (ref 2.5–4.6)

## 2022-05-18 LAB — MAGNESIUM: Magnesium: 2.3 mg/dL (ref 1.7–2.4)

## 2022-05-18 LAB — HIV ANTIBODY (ROUTINE TESTING W REFLEX): HIV Screen 4th Generation wRfx: NONREACTIVE

## 2022-05-18 MED ORDER — SODIUM CHLORIDE 0.9 % IV SOLN
2.0000 g | Freq: Three times a day (TID) | INTRAVENOUS | Status: DC
  Administered 2022-05-18 – 2022-05-21 (×10): 2 g via INTRAVENOUS
  Filled 2022-05-18 (×10): qty 12.5

## 2022-05-18 MED ORDER — FOLIC ACID 1 MG PO TABS
1.0000 mg | ORAL_TABLET | Freq: Every day | ORAL | Status: DC
  Administered 2022-05-18 – 2022-05-21 (×4): 1 mg via ORAL
  Filled 2022-05-18 (×4): qty 1

## 2022-05-18 MED ORDER — SODIUM CHLORIDE 0.9% IV SOLUTION: Freq: Once | INTRAVENOUS | Status: AC

## 2022-05-18 MED ORDER — DIPHENHYDRAMINE HCL 50 MG/ML IJ SOLN
25.0000 mg | Freq: Once | INTRAMUSCULAR | Status: AC
  Administered 2022-05-18: 25 mg via INTRAVENOUS
  Filled 2022-05-18: qty 1

## 2022-05-18 MED ORDER — SODIUM CHLORIDE 0.9 % IV SOLN: INTRAVENOUS | Status: DC | PRN

## 2022-05-18 MED ORDER — ACETAMINOPHEN 325 MG PO TABS
650.0000 mg | ORAL_TABLET | Freq: Once | ORAL | Status: AC
  Administered 2022-05-18: 650 mg via ORAL
  Filled 2022-05-18: qty 2

## 2022-05-18 NOTE — Progress Notes (Signed)
Report received, pt transferred from ED. Orders for PCA in; pt educated and oriented to rm.

## 2022-05-18 NOTE — Progress Notes (Signed)
  Echocardiogram 2D Echocardiogram has been performed.  [name] 05/18/2022, 10:01 AM

## 2022-05-18 NOTE — TOC Initial Note (Signed)
Transition of Care [facility]) - Initial/Assessment Note    Patient Details  Name: [name] MRN: [id number] Date of Birth: July 22, 1961  Transition of Care [facility]) CM/SW Contact:    Servando Snare, LCSW Phone Number: 05/18/2022, 11:12 AM  Clinical Narrative:               TOC met with patient at bedside. Patients daughter is at bedside. Patient reports that she and her daughter have been homeless since March. Patient reports that she and daughter have been sleeping in car behind the [facility]. Patient reports that she is not receiving any assistance from [facility], but uses their parking lot as a safe place. Patient feels that her living situation attributed to her poor health and hospitalization. Patient reports that she has found a place in [facility] that she will loose if she cannot come up with the deposit by Nov 1st. Patient reports that she receives 934 a month in SSI and her daughter receives $36. She is planning to move in with her daughter and cousin so that they can put their resources together. Patient reports she gets food stamps and is eating.      TOC added housing, financial and food resources to AVS. Rental assistance resources provided in person.    Expected Discharge Plan: Homeless Shelter Barriers to Discharge: Continued Medical Work up   Patient Goals and CMS Choice     Choice offered to / list presented to : NA  Expected Discharge Plan and Services Expected Discharge Plan: Homeless Shelter In-house Referral: Clinical Social Work   Post Acute Care Choice: NA Living arrangements for the past 2 months: Homeless                 DME Arranged: N/A DME Agency: NA       HH Arranged: NA [place]: NA        Prior Living Arrangements/Services Living arrangements for the past 2 months: Homeless Lives with:: Adult Children Patient language and need for interpreter reviewed:: Yes Do you feel safe going back to the place where you live?: Yes      Need for Family Participation  in Patient Care: Yes (Comment) Care giver support system in place?: Yes (comment)   Criminal Activity/Legal Involvement Pertinent to Current Situation/Hospitalization: No - Comment as needed  Activities of Daily Living Home Assistive Devices/Equipment: Eyeglasses ADL Screening (condition at time of admission) Patient's cognitive ability adequate to safely complete daily activities?: Yes Is the patient deaf or have difficulty hearing?: No Does the patient have difficulty seeing, even when wearing glasses/contacts?: No Does the patient have difficulty concentrating, remembering, or making decisions?: No Patient able to express need for assistance with ADLs?: Yes Does the patient have difficulty dressing or bathing?: No Independently performs ADLs?: Yes (appropriate for developmental age) Does the patient have difficulty walking or climbing stairs?: No Weakness of Legs: Both Weakness of Arms/Hands: Both  Permission Sought/Granted Permission sought to share information with : Family Supports Permission granted to share information with : Yes, Verbal Permission Granted  Share Information with NAME: [place], daughter (at bedside)           Emotional Assessment Appearance:: Appears stated age Attitude/Demeanor/Rapport: Engaged Affect (typically observed): Appropriate Orientation: : Oriented to Self, Oriented to Place, Oriented to  Time, Oriented to Situation Alcohol / Substance Use: Not Applicable Psych Involvement: No (comment)  Admission diagnosis:  Atypical chest pain [R07.89] Chest pain [R07.9] Acute chest syndrome [facility]) [D57.01] Patient Active Problem List  Diagnosis Date Noted   Chest pain 05/17/2022   Right wrist pain 01/15/2022   Acute respiratory failure with hypoxia (HCC) 03/23/2021   Sickle cell anemia ([place]) 03/22/2021   Aneurysm ([place])    Acute on chronic anemia 11/10/2019   Sickle cell pain crisis ([place]) 11/19/2017   Anemia 02/06/2017   Leg wound, right 02/06/2017    Sickle cell crisis ([place]) 10/24/2015   Anemia of chronic disease    Hepatitis C 05/11/2014   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 [address] [place] [address] Phone: [phone] Fax: [phone]  [facility] - [place], [place] - [address] [place] [address] Phone: [phone] Fax: [phone]     Social Determinants of Health ([place]) Interventions    Readmission Risk Interventions    05/18/2022   11:07 AM  Readmission Risk Prevention Plan  Transportation Screening Complete  Social Work Consult for [place] Planning/Counseling Complete

## 2022-05-18 NOTE — Progress Notes (Addendum)
Initial Nutrition Assessment  DOCUMENTATION CODES:   Non-severe (moderate) malnutrition in context of social or environmental circumstances  INTERVENTION:  -Advance diet to regular as clinically appropriate -Provide Ensure Plus HP BID (vanilla) to provide 350kcal and 20g protein/bottle.  NUTRITION DIAGNOSIS:  Moderate Malnutrition related to social / environmental circumstances as evidenced by mild fat depletion, mild muscle depletion.  GOAL:  Patient will meet greater than or equal to 90% of their needs  MONITOR:  PO intake, Supplement acceptance, Diet advancement  REASON FOR ASSESSMENT:  Malnutrition Screening Tool    ASSESSMENT:  Pt is a 61yo F with PMH of sickle cell anemia, hypothyroidism, anxiety, rheumatoid arthritis, vitamin D deficiency, hepatitis C, brain aneurysm, and cholelithiasis s/p cholecystectomy who presents with right sided chest pain.  Visited pt at bedside this morning. She reports n/v this AM but is feeling hungry and looking forward to solid foods. Pt is unsure if she has lost any weight recently but reports a usual body weight of 70.5kg (155#), current weight of 65.5kg shows a 7% unintended weight loss in an undetermined amount of time. NFPE shows mild to moderate fat loss and mild to moderate muscle wasting. Per report, pt and daughter have been living in their car. Pt meets ASPEN criteria for moderate malnutrition in the setting of social circumstances. Of note, pt reports weakness in legs due to fluid retention.  Discussed usual po intake to obtain meal preferences. Pt does not follow a special therapeutic diet at home. She is agreeable to Ensure Plus HP BID (vanilla) to provide 350kcal and 20g protein/bottle. Monitor for diet advancement and provide ONS.   Medications reviewed and include: lovenox, dilaudid, senokot-s, 1/2NS @75mL /hr, prn compazine, prn miralax  Labs reviewed: FBG:109, AST:62, Total Bili:2.9    NUTRITION - FOCUSED PHYSICAL  EXAM:  Flowsheet Row Most Recent Value  Orbital Region Mild depletion  Upper Arm Region Moderate depletion  Thoracic and Lumbar Region Moderate depletion  Buccal Region Mild depletion  Temple Region Moderate depletion  Clavicle Bone Region Mild depletion  Clavicle and Acromion Bone Region Moderate depletion  Scapular Bone Region Unable to assess  Dorsal Hand Mild depletion  Patellar Region Moderate depletion  Anterior Thigh Region Moderate depletion  Posterior Calf Region Mild depletion  Hair Reviewed  Eyes Reviewed  Mouth Reviewed  Skin Reviewed  Nails Reviewed       Diet Order:   Diet Order             Diet clear liquid Room service appropriate? Yes; Fluid consistency: Thin  Diet effective now                   EDUCATION NEEDS:  Education needs have been addressed  Skin:  Skin Assessment: Reviewed RN Assessment  Last BM:  unknown PTA  Height:  Ht Readings from Last 1 Encounters:  05/17/22 5\' 11"  (1.803 m)   Weight:  Wt Readings from Last 1 Encounters:  05/18/22 65.5 kg    BMI:  Body mass index is 20.14 kg/m.  Estimated Nutritional Needs:  Kcal:  1965-2300kcal Protein:  75-100g Fluid:  >2L  Candise Bowens, MS, RD, LDN, CNSC See AMiON for contact information

## 2022-05-18 NOTE — Plan of Care (Signed)
  Problem: Education: Goal: Knowledge of vaso-occlusive preventative measures will improve Outcome: Progressing Goal: Awareness of infection prevention will improve Outcome: Progressing Goal: Awareness of signs and symptoms of anemia will improve Outcome: Progressing   Problem: Tissue Perfusion: Goal: Complications related to inadequate tissue perfusion will be avoided or minimized Outcome: Progressing   Problem: Respiratory: Goal: Pulmonary complications will be avoided or minimized Outcome: Progressing Goal: Acute Chest Syndrome will be identified early to prevent complications Outcome: Progressing   Problem: Sensory: Goal: Pain level will decrease with appropriate interventions Outcome: Progressing   Problem: Education: Goal: Knowledge of General Education information will improve Description: Including pain rating scale, medication(s)/side effects and non-pharmacologic comfort measures Outcome: Progressing   Problem: Clinical Measurements: Goal: Ability to maintain clinical measurements within normal limits will improve Outcome: Progressing   Problem: Activity: Goal: Risk for activity intolerance will decrease Outcome: Progressing   Problem: Pain Managment: Goal: General experience of comfort will improve Outcome: Progressing   Problem: Safety: Goal: Ability to remain free from injury will improve Outcome: Progressing   Problem: Skin Integrity: Goal: Risk for impaired skin integrity will decrease Outcome: Progressing

## 2022-05-18 NOTE — Progress Notes (Signed)
PHARMACY NOTE:  ANTIMICROBIAL RENAL DOSAGE ADJUSTMENT  Current antimicrobial regimen includes a mismatch between antimicrobial dosage and estimated renal function.  As per policy approved by the Pharmacy & Therapeutics and Medical Executive Committees, the antimicrobial dosage will be adjusted accordingly.  Current antimicrobial dosage:  Cefepime 2 g IV q12h  Indication: PNA  Renal Function:  Estimated Creatinine Clearance: 73.6 mL/min (by C-G formula based on SCr of 0.82 mg/dL). []      On intermittent HD, scheduled: []      On CRRT    Antimicrobial dosage has been changed to:  Cefepime 2 g IV q8h  Lenis Noon, PharmD 05/18/22 8:57 AM

## 2022-05-18 NOTE — Progress Notes (Signed)
Subjective: [name] is a 61 year old female with a medical history significant for sickle cell disease, chronic pain syndrome, rheumatoid arthritis, vitamin D deficiency, hepatitis C, hypothyroidism, anemia of chronic disease, previous brain aneurysm, history of cholelithiasis status postcholecystectomy was admitted overnight for acute chest syndrome in the setting of sickle cell disease. Today, patient continues to have an oxygen requirement of 2 L.  States that chest pain is improving.  She rates her pain at 4/10.  She denies any headache, dizziness, shortness of breath, urinary symptoms, nausea, vomiting, or diarrhea.  Today, patient's hemoglobin is 6.8 g/dL and she is s/p 1 unit PRBCs.  Objective:  Vital signs in last 24 hours:  Vitals:   05/18/22 0900 05/18/22 0902 05/18/22 0920 05/18/22 0930  BP: (!) 157/58 (!) 157/58    Pulse: (!) 51 (!) 47  (!) 59  Resp: 17 11 12 16   Temp:      TempSrc:      SpO2: 100% 100%  100%  Weight:      Height:        Intake/Output from previous day:   Intake/Output Summary (Last 24 hours) at 05/18/2022 0959 Last data filed at 05/18/2022 0303 Gross per 24 hour  Intake 501.33 ml  Output 1 ml  Net 500.33 ml    Physical Exam: General: Alert, awake, oriented x3, in no acute distress.  HEENT: [place]/AT PEER[place], EOMI Neck: Trachea midline,  no masses, no thyromegal,y no JVD, no carotid bruit OROPHARYNX:  Moist, No exudate/ erythema/lesions.  Heart: Regular rate and rhythm, without murmurs, rubs, gallops, PMI non-displaced, no heaves or thrills on palpation.  Lungs: Clear to auscultation, no wheezing or rhonchi noted. No increased vocal fremitus resonant to percussion  Abdomen: Soft, nontender, nondistended, positive bowel sounds, no masses no hepatosplenomegaly noted..  Neuro: No focal neurological deficits noted cranial nerves II through XII grossly intact. DTRs 2+ bilaterally upper and lower extremities. Strength 5 out of 5 in bilateral upper and lower  extremities. Musculoskeletal: No warm swelling or erythema around joints, no spinal tenderness noted. Psychiatric: Patient alert and oriented x3, good insight and cognition, good recent to remote recall. Lymph node survey: No cervical axillary or inguinal lymphadenopathy noted.  Lab Results:  Basic Metabolic Panel:    Component Value Date/Time   NA 136 05/18/2022 0309   NA 140 03/27/2022 1107   K 3.5 05/18/2022 0309   CL 108 05/18/2022 0309   CO2 22 05/18/2022 0309   BUN 12 05/18/2022 0309   BUN 3 (L) 03/27/2022 1107   CREATININE 0.82 05/18/2022 0309   CREATININE 0.64 08/19/2020 1003   GLUCOSE 109 (H) 05/18/2022 0309   CALCIUM 8.6 (L) 05/18/2022 0309   CBC:    Component Value Date/Time   WBC 10.0 05/18/2022 0309   HGB 6.8 (LL) 05/18/2022 0726   HGB 6.5 (LL) 03/27/2022 1107   HCT 18.7 (L) 05/18/2022 0726   HCT 18.3 (L) 03/27/2022 1107   PLT 165 05/18/2022 0309   PLT 245 03/27/2022 1107   MCV 86.7 05/18/2022 0309   MCV 86 03/27/2022 1107   NEUTROABS 7.1 05/18/2022 0309   NEUTROABS 5.4 03/27/2022 1107   LYMPHSABS 1.5 05/18/2022 0309   LYMPHSABS 2.6 03/27/2022 1107   MONOABS 1.2 (H) 05/18/2022 0309   EOSABS 0.1 05/18/2022 0309   EOSABS 0.6 (H) 03/27/2022 1107   BASOSABS 0.1 05/18/2022 0309   BASOSABS 0.1 03/27/2022 1107    Recent Results (from the past 240 hour(s))  Blood culture (routine x 2)  Status: None (Preliminary result)   Collection Time: 05/17/22  8:10 PM   Specimen: BLOOD  Result Value Ref Range Status   Specimen Description   Final    BLOOD RIGHT ANTECUBITAL Performed at [facility] [address], [place], [place] [address]    Special Requests   Final    BOTTLES DRAWN AEROBIC AND ANAEROBIC Blood Culture results may not be optimal due to an excessive volume of blood received in culture bottles Performed at [facility] [address], [place], [place] [address]    Culture   Final    NO GROWTH < 12  HOURS Performed at [facility] [address] [place], [place] [address]    Report Status PENDING  Incomplete  Blood culture (routine x 2)     Status: None (Preliminary result)   Collection Time: 05/17/22  8:15 PM   Specimen: BLOOD  Result Value Ref Range Status   Specimen Description   Final    BLOOD BLOOD RIGHT HAND Performed at [facility] [address], [place], [place] [address]    Special Requests   Final    BOTTLES DRAWN AEROBIC AND ANAEROBIC Blood Culture results may not be optimal due to an excessive volume of blood received in culture bottles Performed at [facility] [address], [place], [place] [address]    Culture   Final    NO GROWTH < 12 HOURS Performed at [facility] [address] [place], [place] [address]    Report Status PENDING  Incomplete  MRSA Next Gen by PCR, Nasal     Status: None   Collection Time: 05/17/22  9:54 PM   Specimen: Nasal Mucosa; Nasal Swab  Result Value Ref Range Status   MRSA by PCR Next Gen NOT DETECTED NOT DETECTED Final    Comment: (NOTE) The GeneXpert MRSA Assay (FDA approved for NASAL specimens only), is one component of a comprehensive MRSA colonization surveillance program. It is not intended to diagnose MRSA infection nor to guide or monitor treatment for MRSA infections. Test performance is not FDA approved in patients less than 20 years old. Performed at [facility], [place] [address] [place], [place] [address]     Studies/Results: CT Angio Chest/Abd/Pel for Dissection W and/or Wo Contrast  Result Date: 05/17/2022 CLINICAL DATA:  Acute aortic syndrome suspected. EXAM: CT ANGIOGRAPHY CHEST, ABDOMEN AND PELVIS TECHNIQUE: Non-contrast CT of the chest was initially obtained. Multidetector CT imaging through the chest, abdomen and pelvis was performed using the standard protocol during bolus administration of intravenous contrast. Multiplanar  reconstructed images and MIPs were obtained and reviewed to evaluate the vascular anatomy. RADIATION DOSE REDUCTION: This exam was performed according to the departmental dose-optimization program which includes automated exposure control, adjustment of the mA and/or kV according to patient size and/or use of iterative reconstruction technique. CONTRAST:  56mL OMNIPAQUE IOHEXOL 350 MG/ML SOLN COMPARISON:  Chest CT dated 01/29/2020. FINDINGS: CTA CHEST FINDINGS Cardiovascular: Borderline cardiomegaly. Trace pericardial effusion measuring approximately 5 mm in thickness anterior to the heart. Mild atherosclerotic calcification of the aortic arch. No aneurysmal dilatation or dissection. The origins of the great vessels of the aortic arch appear patent. No pulmonary artery embolus identified. Mediastinum/Nodes: No hilar or mediastinal adenopathy. The esophagus is grossly unremarkable. No mediastinal fluid collection. Lungs/Pleura: Bibasilar subpleural airspace densities, right greater than left, have progressed since the prior CT and may represent progression of interstitial lung disease.  Superimposed pneumonia is not excluded. Clinical correlation is recommended. Similar appearance of irregular nodule in the right apex, likely post inflammatory scarring. There is no pleural effusion or pneumothorax. The central airways are patent. Musculoskeletal: Osteopenia.  No acute osseous pathology. Review of the MIP images confirms the above findings. CTA ABDOMEN AND PELVIS FINDINGS VASCULAR Aorta: Normal caliber aorta without aneurysm, dissection, vasculitis or significant stenosis. Celiac: High-grade narrowing of the proximal celiac axis may be related to compression by diaphragmatic crus. The celiac axis and its major branches remain patent. SMA: Patent without evidence of aneurysm, dissection, vasculitis or significant stenosis. Renals: Both renal arteries are patent without evidence of aneurysm, dissection, vasculitis,  fibromuscular dysplasia or significant stenosis. IMA: Patent without evidence of aneurysm, dissection, vasculitis or significant stenosis. Inflow: Patent without evidence of aneurysm, dissection, vasculitis or significant stenosis. Veins: No obvious venous abnormality within the limitations of this arterial phase study. Review of the MIP images confirms the above findings. NON-VASCULAR No intra-abdominal free air or free fluid. Hepatobiliary: Cirrhosis. Mild dilatation of the biliary tree, likely post cholecystectomy. No retained calcified stone noted in the central CBD. Pancreas: Unremarkable. No pancreatic ductal dilatation or surrounding inflammatory changes. Spleen: The spleen is not visualized and may be surgically absent or related to other splenectomy. Adrenals/Urinary Tract: The adrenal glands, kidneys, visualized ureters appear unremarkable. The urinary bladder is distended. Stomach/Bowel: There is moderate stool throughout the colon. There is no bowel obstruction or active inflammation. The appendix is not visualized with certainty. No inflammatory changes identified in the right lower quadrant. Lymphatic: No adenopathy. Reproductive: The uterus is grossly unremarkable. No adnexal masses. Other: Small fat containing umbilical hernia. Musculoskeletal: Osteopenia with degenerative changes of the spine. No acute osseous pathology. Review of the MIP images confirms the above findings. IMPRESSION: 1. No aortic aneurysm or dissection. 2. Bibasilar subpleural airspace densities progressed since the prior CT and may represent progression of interstitial lung disease. Superimposed pneumonia is not excluded. 3. Cirrhosis. 4. No bowel obstruction. 5.  Aortic Atherosclerosis (ICD10-I70.0). Electronically Signed   By: Elgie Collard M.D.   On: 05/17/2022 18:49   DG Chest 2 View  Result Date: 05/17/2022 CLINICAL DATA:  chest pain EXAM: CHEST - 2 VIEW COMPARISON:  Chest x-ray 05/13/2022, CT chest 01/29/2020  FINDINGS: Persistent mild cardiomegaly. The heart and mediastinal contours are unchanged. Aortic calcification. No focal consolidation. Mild pulmonary edema. No pleural effusion. No pneumothorax. No acute osseous abnormality. IMPRESSION: Mild pulmonary edema. Electronically Signed   By: Tish Frederickson M.D.   On: 05/17/2022 15:30    Medications: Scheduled Meds:  sodium chloride   Intravenous Once   acetaminophen  650 mg Oral Once   Chlorhexidine Gluconate Cloth  6 each Topical Daily   diphenhydrAMINE  25 mg Intravenous Once   enoxaparin (LOVENOX) injection  40 mg Subcutaneous Q24H   HYDROmorphone   Intravenous Q4H   ipratropium-albuterol  3 mL Nebulization BID   mouth rinse  15 mL Mouth Rinse 4 times per day   senna-docusate  1 tablet Oral QHS   Continuous Infusions:  sodium chloride 75 mL/hr at 05/18/22 0303   ceFEPime (MAXIPIME) IV 2 g (05/18/22 0928)   PRN Meds:.diphenhydrAMINE, melatonin, naloxone **AND** sodium chloride flush, mouth rinse, polyethylene glycol, prochlorperazine  Consultants: none  Procedures: none  Antibiotics: none  Assessment/Plan: Principal Problem:   Chest pain  Probable acute chest syndrome: We will de-escalate antibiotics, discontinue vancomycin. Currently has an oxygen requirement of 2 L, wean as tolerated Tylenol 650 mg every 6  hours as needed for fever Continue incentive spirometer.  Sickle cell disease with pain crisis: We will continue IV Dilaudid PCA today.  Consider transitioning to oral medications on tomorrow.  Decrease IV fluids to 0.45% saline at 50 mL/h. Continue to hold Toradol, AKI on admission. Monitor vital signs very closely, reevaluate pain scale regularly, and supplemental oxygen as needed.  Anemia of chronic disease: Hemoglobin is 6.8 g/dL today.  Transfuse 1 additional unit PRBCs to maintain hemoglobin above 7.  Monitor labs in AM.  Acute kidney injury: Resolved following fluid resuscitation.  Continue to monitor closely  and avoid nephrotoxins.     Code Status: Full Code Family Communication: Family at bedside. Disposition Plan: Not yet ready for discharge.  Transfer to telemetry today.  Patient hemodynamically stable.   Nolon Nations  APRN, MSN, FNP-C Patient Care [facility] [address] [place], [place] [address] [phone]  If 7PM-7AM, please contact night-coverage.  05/18/2022, 9:59 AM  LOS: 1 day

## 2022-05-19 ENCOUNTER — Encounter (HOSPITAL_COMMUNITY): Payer: Self-pay | Admitting: Internal Medicine

## 2022-05-19 DIAGNOSIS — D5700 Hb-SS disease with crisis, unspecified: Secondary | ICD-10-CM

## 2022-05-19 DIAGNOSIS — D638 Anemia in other chronic diseases classified elsewhere: Secondary | ICD-10-CM | POA: Diagnosis not present

## 2022-05-19 DIAGNOSIS — R0789 Other chest pain: Secondary | ICD-10-CM

## 2022-05-19 DIAGNOSIS — D5701 Hb-SS disease with acute chest syndrome: Secondary | ICD-10-CM | POA: Diagnosis not present

## 2022-05-19 DIAGNOSIS — E440 Moderate protein-calorie malnutrition: Secondary | ICD-10-CM

## 2022-05-19 LAB — CBC WITH DIFFERENTIAL/PLATELET
Abs Immature Granulocytes: 0.07 10*3/uL (ref 0.00–0.07)
Basophils Absolute: 0.1 10*3/uL (ref 0.0–0.1)
Basophils Relative: 1 %
Eosinophils Absolute: 0.1 10*3/uL (ref 0.0–0.5)
Eosinophils Relative: 1 %
HCT: 18.2 % — ABNORMAL LOW (ref 36.0–46.0)
Hemoglobin: 6.6 g/dL — CL (ref 12.0–15.0)
Immature Granulocytes: 1 %
Lymphocytes Relative: 15 %
Lymphs Abs: 1.5 10*3/uL (ref 0.7–4.0)
MCH: 31.4 pg (ref 26.0–34.0)
MCHC: 36.3 g/dL — ABNORMAL HIGH (ref 30.0–36.0)
MCV: 86.7 fL (ref 80.0–100.0)
Monocytes Absolute: 1.2 10*3/uL — ABNORMAL HIGH (ref 0.1–1.0)
Monocytes Relative: 12 %
Neutro Abs: 7.1 10*3/uL (ref 1.7–7.7)
Neutrophils Relative %: 70 %
Platelets: 165 10*3/uL (ref 150–400)
RBC: 2.1 MIL/uL — ABNORMAL LOW (ref 3.87–5.11)
RDW: 19.4 % — ABNORMAL HIGH (ref 11.5–15.5)
WBC: 10 10*3/uL (ref 4.0–10.5)
nRBC: 0.4 % — ABNORMAL HIGH (ref 0.0–0.2)

## 2022-05-19 LAB — CBC
HCT: 20.5 % — ABNORMAL LOW (ref 36.0–46.0)
Hemoglobin: 7.2 g/dL — ABNORMAL LOW (ref 12.0–15.0)
MCH: 30 pg (ref 26.0–34.0)
MCHC: 35.1 g/dL (ref 30.0–36.0)
MCV: 85.4 fL (ref 80.0–100.0)
Platelets: 155 10*3/uL (ref 150–400)
RBC: 2.4 MIL/uL — ABNORMAL LOW (ref 3.87–5.11)
RDW: 19.8 % — ABNORMAL HIGH (ref 11.5–15.5)
WBC: 9.3 10*3/uL (ref 4.0–10.5)
nRBC: 2.4 % — ABNORMAL HIGH (ref 0.0–0.2)

## 2022-05-19 LAB — BASIC METABOLIC PANEL
Anion gap: 7 (ref 5–15)
BUN: 6 mg/dL — ABNORMAL LOW (ref 8–23)
CO2: 22 mmol/L (ref 22–32)
Calcium: 8.6 mg/dL — ABNORMAL LOW (ref 8.9–10.3)
Chloride: 107 mmol/L (ref 98–111)
Creatinine, Ser: 0.67 mg/dL (ref 0.44–1.00)
GFR, Estimated: >60 mL/min (ref >60)
Glucose, Bld: 95 mg/dL (ref 70–99)
Potassium: 4 mmol/L (ref 3.5–5.1)
Sodium: 136 mmol/L (ref 135–145)

## 2022-05-19 MED ORDER — HYDROMORPHONE 1 MG/ML IV SOLN
INTRAVENOUS | Status: DC
  Administered 2022-05-19: 1.4 mg via INTRAVENOUS
  Administered 2022-05-20: 0.1 mg via INTRAVENOUS
  Administered 2022-05-20: 0.8 mg via INTRAVENOUS
  Filled 2022-05-19: qty 30

## 2022-05-19 MED ORDER — ONDANSETRON HCL 4 MG/2ML IJ SOLN: 4.0000 mg | Freq: Four times a day (QID) | INTRAMUSCULAR | Status: DC | PRN

## 2022-05-19 MED ORDER — PROCHLORPERAZINE EDISYLATE 10 MG/2ML IJ SOLN: 5.0000 mg | INTRAMUSCULAR | Status: DC | PRN

## 2022-05-19 MED ORDER — KETOROLAC TROMETHAMINE 15 MG/ML IJ SOLN
15.0000 mg | Freq: Four times a day (QID) | INTRAMUSCULAR | Status: DC
  Administered 2022-05-19 – 2022-05-21 (×8): 15 mg via INTRAVENOUS
  Filled 2022-05-19 (×8): qty 1

## 2022-05-19 NOTE — Plan of Care (Signed)
05/19/2022  Problem: Education: Goal: Knowledge of vaso-occlusive preventative measures will improve Outcome: Progressing Goal: Awareness of infection prevention will improve Outcome: Progressing Goal: Awareness of signs and symptoms of anemia will improve Outcome: Progressing Goal: Long-term complications will improve Outcome: Progressing   Problem: Self-Care: Goal: Ability to incorporate actions that prevent/reduce pain crisis will improve Outcome: Progressing   Problem: Bowel/Gastric: Goal: Gut motility will be maintained Outcome: Progressing   Problem: Tissue Perfusion: Goal: Complications related to inadequate tissue perfusion will be avoided or minimized Outcome: Progressing   Problem: Respiratory: Goal: Pulmonary complications will be avoided or minimized Outcome: Progressing Goal: Acute Chest Syndrome will be identified early to prevent complications Outcome: Progressing   Problem: Fluid Volume: Goal: Ability to maintain a balanced intake and output will improve Outcome: Progressing   Problem: Sensory: Goal: Pain level will decrease with appropriate interventions Outcome: Progressing   Problem: Health Behavior: Goal: Postive changes in compliance with treatment and prescription regimens will improve Outcome: Progressing   Problem: Education: Goal: Knowledge of General Education information will improve Description: Including pain rating scale, medication(s)/side effects and non-pharmacologic comfort measures Outcome: Progressing   Problem: Health Behavior/Discharge Planning: Goal: Ability to manage health-related needs will improve Outcome: Progressing   Problem: Clinical Measurements: Goal: Ability to maintain clinical measurements within normal limits will improve Outcome: Progressing Goal: Will remain free from infection Outcome: Progressing Goal: Diagnostic test results will improve Outcome: Progressing Goal: Respiratory complications will  improve Outcome: Progressing Goal: Cardiovascular complication will be avoided Outcome: Progressing   Problem: Activity: Goal: Risk for activity intolerance will decrease Outcome: Progressing   Problem: Nutrition: Goal: Adequate nutrition will be maintained Outcome: Progressing   Problem: Coping: Goal: Level of anxiety will decrease Outcome: Progressing   Problem: Elimination: Goal: Will not experience complications related to bowel motility Outcome: Progressing Goal: Will not experience complications related to urinary retention Outcome: Progressing   Problem: Pain Managment: Goal: General experience of comfort will improve Outcome: Progressing   Problem: Safety: Goal: Ability to remain free from injury will improve Outcome: Progressing   Problem: Skin Integrity: Goal: Risk for impaired skin integrity will decrease Outcome: Progressing   Cindy S. Brigitte Pulse BSN, RN, [place] 05/19/2022 2:38 AM

## 2022-05-19 NOTE — Progress Notes (Signed)
Report given to RN. Daughter at bedside. Pt & daughter notified of transfer. Belongings collected. Pt stable at this time.

## 2022-05-19 NOTE — Progress Notes (Signed)
Patient ID: [name], female   DOB: 07-03-1961, 61 y.o.   MRN: [id number] Subjective: [name] is a 61 year old female with a medical history significant for sickle cell disease, chronic pain syndrome, rheumatoid arthritis, vitamin D deficiency, hepatitis C, hypothyroidism, anemia of chronic disease, previous brain aneurysm, history of cholelithiasis status postcholecystectomy was admitted overnight for acute chest syndrome in the setting of sickle cell disease.  Patient was seen actively vomiting during this encounter.  She however said her pain is much improved but she is vomiting from Dilaudid and Benadryl makes her sleep excessively.  She wonders if she can have a change of medications.  She has no headache.  She denies any dizziness, shortness of breath, cough or chest pain.  She denies any urinary symptoms.  No fever.  Objective:  Vital signs in last 24 hours:  Vitals:   05/19/22 1000 05/19/22 1146 05/19/22 1200 05/19/22 1213  BP: 134/60  (!) 146/63   Pulse: (!) 45  (!) 50   Resp: (!) 8 16 (!) 9   Temp:    98.1 F (36.7 C)  TempSrc:    Oral  SpO2: 96% 99% 97%   Weight:      Height:        Intake/Output from previous day:   Intake/Output Summary (Last 24 hours) at 05/19/2022 1424 Last data filed at 05/19/2022 1322 Gross per 24 hour  Intake 2539.55 ml  Output 1900 ml  Net 639.55 ml    Physical Exam: General: Alert, awake, oriented x3, in no acute distress.  Vomiting HEENT: [place]/AT PEERL, EOMI Neck: Trachea midline,  no masses, no thyromegal,y no JVD, no carotid bruit OROPHARYNX:  Moist, No exudate/ erythema/lesions.  Heart: Regular rate and rhythm, without murmurs, rubs, gallops, PMI non-displaced, no heaves or thrills on palpation.  Lungs: Clear to auscultation, no wheezing or rhonchi noted. No increased vocal fremitus resonant to percussion  Abdomen: Soft, nontender, nondistended, positive bowel sounds, no masses no hepatosplenomegaly noted..  Neuro: No focal  neurological deficits noted cranial nerves II through XII grossly intact. DTRs 2+ bilaterally upper and lower extremities. Strength 5 out of 5 in bilateral upper and lower extremities. Musculoskeletal: No warm swelling or erythema around joints, no spinal tenderness noted. Psychiatric: Patient alert and oriented x3, good insight and cognition, good recent to remote recall. Lymph node survey: No cervical axillary or inguinal lymphadenopathy noted.  Lab Results:  Basic Metabolic Panel:    Component Value Date/Time   NA 136 05/19/2022 0314   NA 140 03/27/2022 1107   K 4.0 05/19/2022 0314   CL 107 05/19/2022 0314   CO2 22 05/19/2022 0314   BUN 6 (L) 05/19/2022 0314   BUN 3 (L) 03/27/2022 1107   CREATININE 0.67 05/19/2022 0314   CREATININE 0.64 08/19/2020 1003   GLUCOSE 95 05/19/2022 0314   CALCIUM 8.6 (L) 05/19/2022 0314   CBC:    Component Value Date/Time   WBC 9.3 05/19/2022 0314   HGB 7.2 (L) 05/19/2022 0314   HGB 6.5 (LL) 03/27/2022 1107   HCT 20.5 (L) 05/19/2022 0314   HCT 18.3 (L) 03/27/2022 1107   PLT 155 05/19/2022 0314   PLT 245 03/27/2022 1107   MCV 85.4 05/19/2022 0314   MCV 86 03/27/2022 1107   NEUTROABS 7.1 05/18/2022 0309   NEUTROABS 5.4 03/27/2022 1107   LYMPHSABS 1.5 05/18/2022 0309   LYMPHSABS 2.6 03/27/2022 1107   MONOABS 1.2 (H) 05/18/2022 0309   EOSABS 0.1 05/18/2022 0309   EOSABS 0.6 (H)  03/27/2022 1107   BASOSABS 0.1 05/18/2022 0309   BASOSABS 0.1 03/27/2022 1107    Recent Results (from the past 240 hour(s))  Blood culture (routine x 2)     Status: None (Preliminary result)   Collection Time: 05/17/22  8:10 PM   Specimen: BLOOD  Result Value Ref Range Status   Specimen Description   Final    BLOOD RIGHT ANTECUBITAL Performed at [facility], [address], [place], [place] [address]    Special Requests   Final    BOTTLES DRAWN AEROBIC AND ANAEROBIC Blood Culture results may not be optimal due to an excessive volume of blood  received in culture bottles Performed at [facility], [address], [place], [place] [address]    Culture   Final    NO GROWTH 2 DAYS Performed at [facility], [address], [place], [place] [address]    Report Status PENDING  Incomplete  Blood culture (routine x 2)     Status: None (Preliminary result)   Collection Time: 05/17/22  8:15 PM   Specimen: BLOOD  Result Value Ref Range Status   Specimen Description   Final    BLOOD BLOOD RIGHT HAND Performed at [facility], [address], [place], [place] [address]    Special Requests   Final    BOTTLES DRAWN AEROBIC AND ANAEROBIC Blood Culture results may not be optimal due to an excessive volume of blood received in culture bottles Performed at [facility], [address], [place], [place] [address]    Culture   Final    NO GROWTH 2 DAYS Performed at [facility], [address], [place], [place] [address]    Report Status PENDING  Incomplete  MRSA Next Gen by PCR, Nasal     Status: None   Collection Time: 05/17/22  9:54 PM   Specimen: Nasal Mucosa; Nasal Swab  Result Value Ref Range Status   MRSA by PCR Next Gen NOT DETECTED NOT DETECTED Final    Comment: (NOTE) The GeneXpert MRSA Assay (FDA approved for NASAL specimens only), is one component of a comprehensive MRSA colonization surveillance program. It is not intended to diagnose MRSA infection nor to guide or monitor treatment for MRSA infections. Test performance is not FDA approved in patients less than 75 years old. Performed at [facility], [address], [place], [place] [address]     Studies/Results: ECHOCARDIOGRAM COMPLETE  Result Date: 05/18/2022    ECHOCARDIOGRAM REPORT   Patient Name:   [name] Date of Exam: 05/18/2022 Medical Rec #:  [id number]     Height:       71.0 in Accession #:    [id number]    Weight:       144.4 lb Date of Birth:  [birth year]     BSA:           1.836 m Patient Age:    61 years      BP:           134/69 mmHg Patient Gender: F             HR:           45 bpm. Exam Location:  Inpatient Procedure: 2D Echo, Cardiac Doppler and Color Doppler Indications:    R07.9* Chest pain, unspecified  History:        Patient has prior history of Echocardiogram examinations, most  recent 12/03/2012. Arrythmias:WPW; Signs/Symptoms:Chest Pain and                 Dyspnea. Sickle cell anemia.  Sonographer:    Sheralyn Boatman RDCS Referring Phys: [id number] CAROLE N Mcclay  Sonographer Comments: Technically difficult study due to poor echo windows. IMPRESSIONS  1. Left ventricular ejection fraction, by estimation, is 55 to 60%. The left ventricle has normal function. The left ventricle has no regional wall motion abnormalities. Left ventricular diastolic parameters were normal.  2. Right ventricular systolic function is normal. The right ventricular size is moderately enlarged. There is normal pulmonary artery systolic pressure. The estimated right ventricular systolic pressure is 26.2 mmHg.  3. Left atrial size was severely dilated.  4. Right atrial size was severely dilated.  5. The mitral valve is normal in structure. Moderate mitral valve regurgitation. No evidence of mitral stenosis.  6. The aortic valve is normal in structure. Aortic valve regurgitation is not visualized. No aortic stenosis is present.  7. The inferior vena cava is dilated in size with <50% respiratory variability, suggesting right atrial pressure of 15 mmHg. FINDINGS  Left Ventricle: Left ventricular ejection fraction, by estimation, is 55 to 60%. The left ventricle has normal function. The left ventricle has no regional wall motion abnormalities. The left ventricular internal cavity size was normal in size. There is  no left ventricular hypertrophy. Left ventricular diastolic parameters were normal. Right Ventricle: The right ventricular size is moderately enlarged. No increase in right  ventricular wall thickness. Right ventricular systolic function is normal. There is normal pulmonary artery systolic pressure. The tricuspid regurgitant velocity is 1.67 m/s, and with an assumed right atrial pressure of 15 mmHg, the estimated right ventricular systolic pressure is 26.2 mmHg. Left Atrium: Left atrial size was severely dilated. Right Atrium: Right atrial size was severely dilated. Pericardium: There is no evidence of pericardial effusion. Mitral Valve: The mitral valve is normal in structure. Moderate mitral valve regurgitation. No evidence of mitral valve stenosis. Tricuspid Valve: The tricuspid valve is normal in structure. Tricuspid valve regurgitation is mild . No evidence of tricuspid stenosis. Aortic Valve: The aortic valve is normal in structure. Aortic valve regurgitation is not visualized. No aortic stenosis is present. Pulmonic Valve: The pulmonic valve was normal in structure. Pulmonic valve regurgitation is not visualized. No evidence of pulmonic stenosis. Aorta: The aortic root is normal in size and structure. Venous: The inferior vena cava is dilated in size with less than 50% respiratory variability, suggesting right atrial pressure of 15 mmHg. IAS/Shunts: No atrial level shunt detected by color flow Doppler.  LEFT VENTRICLE PLAX 2D LVIDd:         5.30 cm      Diastology LVIDs:         3.30 cm      LV e' medial:    7.94 cm/s LV PW:         1.10 cm      LV E/e' medial:  10.5 LV IVS:        1.00 cm      LV e' lateral:   13.90 cm/s LVOT diam:     2.30 cm      LV E/e' lateral: 6.0 LV SV:         110 LV SV Index:   60 LVOT Area:     4.15 cm  LV Volumes (MOD) LV vol d, MOD A2C: 185.0 ml LV vol d, MOD A4C: 181.0 ml LV vol s, MOD A2C:  72.0 ml LV vol s, MOD A4C: 72.5 ml LV SV MOD A2C:     113.0 ml LV SV MOD A4C:     181.0 ml LV SV MOD BP:      111.8 ml RIGHT VENTRICLE             IVC RV S prime:     13.60 cm/s  IVC diam: 2.50 cm TAPSE (M-mode): 2.5 cm LEFT ATRIUM              Index        RIGHT  ATRIUM           Index LA diam:        4.30 cm  2.34 cm/m   RA Area:     29.30 cm LA Vol (A2C):   131.0 ml 71.35 ml/m  RA Volume:   112.00 ml 61.00 ml/m LA Vol (A4C):   138.0 ml 75.16 ml/m LA Biplane Vol: 136.0 ml 74.08 ml/m  AORTIC VALVE LVOT Vmax:   116.00 cm/s LVOT Vmean:  69.700 cm/s LVOT VTI:    0.264 m  AORTA Ao Root diam: 3.10 cm Ao Asc diam:  3.40 cm MITRAL VALVE                  TRICUSPID VALVE MV Area (PHT): 2.83 cm       TR Peak grad:   11.2 mmHg MV Decel Time: 268 msec       TR Vmax:        167.00 cm/s MR Peak grad:    116.4 mmHg MR Mean grad:    84.5 mmHg    SHUNTS MR Vmax:         539.50 cm/s  Systemic VTI:  0.26 m MR Vmean:        447.5 cm/s   Systemic Diam: 2.30 cm MR PISA:         1.57 cm MR PISA Eff ROA: 11 mm MR PISA Radius:  0.50 cm MV E velocity: 83.20 cm/s MV A velocity: 54.80 cm/s MV E/A ratio:  1.52 Donato Schultz MD Electronically signed by Donato Schultz MD Signature Date/Time: 05/18/2022/3:07:46 PM    Final    CT Angio Chest/Abd/Pel for Dissection W and/or Wo Contrast  Result Date: 05/17/2022 CLINICAL DATA:  Acute aortic syndrome suspected. EXAM: CT ANGIOGRAPHY CHEST, ABDOMEN AND PELVIS TECHNIQUE: Non-contrast CT of the chest was initially obtained. Multidetector CT imaging through the chest, abdomen and pelvis was performed using the standard protocol during bolus administration of intravenous contrast. Multiplanar reconstructed images and MIPs were obtained and reviewed to evaluate the vascular anatomy. RADIATION DOSE REDUCTION: This exam was performed according to the departmental dose-optimization program which includes automated exposure control, adjustment of the mA and/or kV according to patient size and/or use of iterative reconstruction technique. CONTRAST:  75mL OMNIPAQUE IOHEXOL 350 MG/ML SOLN COMPARISON:  Chest CT dated 01/29/2020. FINDINGS: CTA CHEST FINDINGS Cardiovascular: Borderline cardiomegaly. Trace pericardial effusion measuring approximately 5 mm in thickness  anterior to the heart. Mild atherosclerotic calcification of the aortic arch. No aneurysmal dilatation or dissection. The origins of the great vessels of the aortic arch appear patent. No pulmonary artery embolus identified. Mediastinum/Nodes: No hilar or mediastinal adenopathy. The esophagus is grossly unremarkable. No mediastinal fluid collection. Lungs/Pleura: Bibasilar subpleural airspace densities, right greater than left, have progressed since the prior CT and may represent progression of interstitial lung disease. Superimposed pneumonia is not excluded. Clinical correlation is recommended. Similar appearance of irregular nodule in the  right apex, likely post inflammatory scarring. There is no pleural effusion or pneumothorax. The central airways are patent. Musculoskeletal: Osteopenia.  No acute osseous pathology. Review of the MIP images confirms the above findings. CTA ABDOMEN AND PELVIS FINDINGS VASCULAR Aorta: Normal caliber aorta without aneurysm, dissection, vasculitis or significant stenosis. Celiac: High-grade narrowing of the proximal celiac axis may be related to compression by diaphragmatic crus. The celiac axis and its major branches remain patent. SMA: Patent without evidence of aneurysm, dissection, vasculitis or significant stenosis. Renals: Both renal arteries are patent without evidence of aneurysm, dissection, vasculitis, fibromuscular dysplasia or significant stenosis. IMA: Patent without evidence of aneurysm, dissection, vasculitis or significant stenosis. Inflow: Patent without evidence of aneurysm, dissection, vasculitis or significant stenosis. Veins: No obvious venous abnormality within the limitations of this arterial phase study. Review of the MIP images confirms the above findings. NON-VASCULAR No intra-abdominal free air or free fluid. Hepatobiliary: Cirrhosis. Mild dilatation of the biliary tree, likely post cholecystectomy. No retained calcified stone noted in the central CBD.  Pancreas: Unremarkable. No pancreatic ductal dilatation or surrounding inflammatory changes. Spleen: The spleen is not visualized and may be surgically absent or related to other splenectomy. Adrenals/Urinary Tract: The adrenal glands, kidneys, visualized ureters appear unremarkable. The urinary bladder is distended. Stomach/Bowel: There is moderate stool throughout the colon. There is no bowel obstruction or active inflammation. The appendix is not visualized with certainty. No inflammatory changes identified in the right lower quadrant. Lymphatic: No adenopathy. Reproductive: The uterus is grossly unremarkable. No adnexal masses. Other: Small fat containing umbilical hernia. Musculoskeletal: Osteopenia with degenerative changes of the spine. No acute osseous pathology. Review of the MIP images confirms the above findings. IMPRESSION: 1. No aortic aneurysm or dissection. 2. Bibasilar subpleural airspace densities progressed since the prior CT and may represent progression of interstitial lung disease. Superimposed pneumonia is not excluded. 3. Cirrhosis. 4. No bowel obstruction. 5.  Aortic Atherosclerosis (ICD10-I70.0). Electronically Signed   By: Elgie Collard M.D.   On: 05/17/2022 18:49   DG Chest 2 View  Result Date: 05/17/2022 CLINICAL DATA:  chest pain EXAM: CHEST - 2 VIEW COMPARISON:  Chest x-ray 05/13/2022, CT chest 01/29/2020 FINDINGS: Persistent mild cardiomegaly. The heart and mediastinal contours are unchanged. Aortic calcification. No focal consolidation. Mild pulmonary edema. No pleural effusion. No pneumothorax. No acute osseous abnormality. IMPRESSION: Mild pulmonary edema. Electronically Signed   By: Tish Frederickson M.D.   On: 05/17/2022 15:30    Medications: Scheduled Meds:  Chlorhexidine Gluconate Cloth  6 each Topical Daily   enoxaparin (LOVENOX) injection  40 mg Subcutaneous Q24H   folic acid  1 mg Oral Daily   HYDROmorphone   Intravenous Q4H   ipratropium-albuterol  3 mL  Nebulization BID   mouth rinse  15 mL Mouth Rinse 4 times per day   senna-docusate  1 tablet Oral QHS   Continuous Infusions:  sodium chloride 50 mL/hr at 05/19/22 1219   ceFEPime (MAXIPIME) IV 2 g (05/19/22 1422)   PRN Meds:.diphenhydrAMINE, melatonin, naloxone **AND** sodium chloride flush, ondansetron (ZOFRAN) IV, mouth rinse, polyethylene glycol  Consultants: None  Procedures: None  Antibiotics: IV cefepime for possible acute chest versus HCAP  Assessment/Plan: Principal Problem:   Malnutrition of moderate degree Active Problems:   Anemia of chronic disease   Sickle cell pain crisis (HCC)   Chest pain  Acute chest syndrome versus HCAP: Patient on IV cefepime.  We will continue.  Continue supplemental oxygen.  Continue incentive spirometry.  Patient encouraged to ambulate.  We will transfer out of stepdown this afternoon. Hb Sickle Cell Disease with Pain crisis: Continue IVF at 50 cc an hour since patient is still vomiting, begin to wean Dilaudid PCA, will start IV Toradol 15 mg Q 6 H in view of resolved AKI, Monitor vitals very closely, Re-evaluate pain scale regularly, 2 L of Oxygen by [place]. Acute kidney injury: Resolved.  Since patient is having significant side effect of vomiting from Dilaudid, will wean off and replace with IV Toradol Anemia of Chronic Disease: Hemoglobin is 7.2 status post transfusion of 1 unit of packed red blood cell.  Patient is currently asymptomatic.  We will continue to monitor very closely. Chronic pain Syndrome: Continue home pain medications.  Code Status: Full Code Family Communication: N/A Disposition Plan: Not yet ready for discharge  Ayuub Penley  If 7PM-7AM, please contact night-coverage.  05/19/2022, 2:24 PM  LOS: 2 days

## 2022-05-20 DIAGNOSIS — D638 Anemia in other chronic diseases classified elsewhere: Secondary | ICD-10-CM | POA: Diagnosis not present

## 2022-05-20 DIAGNOSIS — D5701 Hb-SS disease with acute chest syndrome: Secondary | ICD-10-CM | POA: Diagnosis not present

## 2022-05-20 DIAGNOSIS — R0789 Other chest pain: Secondary | ICD-10-CM | POA: Diagnosis not present

## 2022-05-20 DIAGNOSIS — E440 Moderate protein-calorie malnutrition: Secondary | ICD-10-CM | POA: Diagnosis not present

## 2022-05-20 LAB — CBC
HCT: 19.4 % — ABNORMAL LOW (ref 36.0–46.0)
Hemoglobin: 6.9 g/dL — CL (ref 12.0–15.0)
MCH: 30.4 pg (ref 26.0–34.0)
MCHC: 35.6 g/dL (ref 30.0–36.0)
MCV: 85.5 fL (ref 80.0–100.0)
Platelets: 153 10*3/uL (ref 150–400)
RBC: 2.27 MIL/uL — ABNORMAL LOW (ref 3.87–5.11)
RDW: 21.2 % — ABNORMAL HIGH (ref 11.5–15.5)
WBC: 7.4 10*3/uL (ref 4.0–10.5)
nRBC: 2.8 % — ABNORMAL HIGH (ref 0.0–0.2)

## 2022-05-20 LAB — HEMOGLOBIN AND HEMATOCRIT, BLOOD
HCT: 22.9 % — ABNORMAL LOW (ref 36.0–46.0)
Hemoglobin: 7.9 g/dL — ABNORMAL LOW (ref 12.0–15.0)

## 2022-05-20 LAB — PREPARE RBC (CROSSMATCH)

## 2022-05-20 MED ORDER — SODIUM CHLORIDE 0.9% IV SOLUTION: Freq: Once | INTRAVENOUS | Status: AC

## 2022-05-20 MED ORDER — HYDROMORPHONE 1 MG/ML IV SOLN
INTRAVENOUS | Status: DC
  Administered 2022-05-20: 0.3 mg via INTRAVENOUS
  Administered 2022-05-20 (×2): 0.1 mg via INTRAVENOUS
  Administered 2022-05-21: 1 mg via INTRAVENOUS
  Administered 2022-05-21 (×2): 0.3 mg via INTRAVENOUS
  Filled 2022-05-20: qty 30

## 2022-05-20 NOTE — Progress Notes (Signed)
Patient ID: [name], female   DOB: 1960-11-27, 61 y.o.   MRN: [id number] Subjective: [name] is a 61 year old female with a medical history significant for sickle cell disease, chronic pain syndrome, rheumatoid arthritis, vitamin D deficiency, hepatitis C, hypothyroidism, anemia of chronic disease, previous brain aneurysm, history of cholelithiasis status postcholecystectomy was admitted overnight for acute chest syndrome in the setting of sickle cell disease.  Patient feels much improved this morning but still very weak.  Hemoglobin has dropped below 7 again.  Otherwise patient has no other symptoms.  Pain is significantly improved, now at baseline.  Vomiting has stopped although her appetite is not great yet, only drinking soup.  She has no fever.  She denies any cough or shortness of breath, no chest pain.  No urinary symptoms.  Objective:  Vital signs in last 24 hours:  Vitals:   05/20/22 0915 05/20/22 0917 05/20/22 1139 05/20/22 1152  BP: (!) 152/71 (!) 152/71  (!) 144/64  Pulse:  (!) 49  (!) 51  Resp: 16  16 16   Temp: 98.2 F (36.8 C) 98.2 F (36.8 C)  98.1 F (36.7 C)  TempSrc:  Oral  Oral  SpO2:   100% 95%  Weight:      Height:        Intake/Output from previous day:   Intake/Output Summary (Last 24 hours) at 05/20/2022 1228 Last data filed at 05/20/2022 1135 Gross per 24 hour  Intake 782.22 ml  Output 1150 ml  Net -367.78 ml     Physical Exam: General: Alert, awake, oriented x3, in no acute distress.  Vomiting HEENT: [place]/AT PEERL, EOMI Neck: Trachea midline,  no masses, no thyromegal,y no JVD, no carotid bruit OROPHARYNX:  Moist, No exudate/ erythema/lesions.  Heart: Regular rate and rhythm, without murmurs, rubs, gallops, PMI non-displaced, no heaves or thrills on palpation.  Lungs: Clear to auscultation, no wheezing or rhonchi noted. No increased vocal fremitus resonant to percussion  Abdomen: Soft, nontender, nondistended, positive bowel sounds, no  masses no hepatosplenomegaly noted..  Neuro: No focal neurological deficits noted cranial nerves II through XII grossly intact. DTRs 2+ bilaterally upper and lower extremities. Strength 5 out of 5 in bilateral upper and lower extremities. Musculoskeletal: No warm swelling or erythema around joints, no spinal tenderness noted. Psychiatric: Patient alert and oriented x3, good insight and cognition, good recent to remote recall. Lymph node survey: No cervical axillary or inguinal lymphadenopathy noted.  Lab Results:  Basic Metabolic Panel:    Component Value Date/Time   NA 136 05/19/2022 0314   NA 140 03/27/2022 1107   K 4.0 05/19/2022 0314   CL 107 05/19/2022 0314   CO2 22 05/19/2022 0314   BUN 6 (L) 05/19/2022 0314   BUN 3 (L) 03/27/2022 1107   CREATININE 0.67 05/19/2022 0314   CREATININE 0.64 08/19/2020 1003   GLUCOSE 95 05/19/2022 0314   CALCIUM 8.6 (L) 05/19/2022 0314   CBC:    Component Value Date/Time   WBC 7.4 05/20/2022 0542   HGB 6.9 (LL) 05/20/2022 0542   HGB 6.5 (LL) 03/27/2022 1107   HCT 19.4 (L) 05/20/2022 0542   HCT 18.3 (L) 03/27/2022 1107   PLT 153 05/20/2022 0542   PLT 245 03/27/2022 1107   MCV 85.5 05/20/2022 0542   MCV 86 03/27/2022 1107   NEUTROABS 7.1 05/18/2022 0309   NEUTROABS 5.4 03/27/2022 1107   LYMPHSABS 1.5 05/18/2022 0309   LYMPHSABS 2.6 03/27/2022 1107   MONOABS 1.2 (H) 05/18/2022 0309   EOSABS  0.1 05/18/2022 0309   EOSABS 0.6 (H) 03/27/2022 1107   BASOSABS 0.1 05/18/2022 0309   BASOSABS 0.1 03/27/2022 1107    Recent Results (from the past 240 hour(s))  Blood culture (routine x 2)     Status: None (Preliminary result)   Collection Time: 05/17/22  8:10 PM   Specimen: BLOOD  Result Value Ref Range Status   Specimen Description   Final    BLOOD RIGHT ANTECUBITAL Performed at [facility], [address] [place], [place] [address]    Special Requests   Final    BOTTLES DRAWN AEROBIC AND ANAEROBIC Blood Culture results  may not be optimal due to an excessive volume of blood received in culture bottles Performed at [facility], [address], [place], [place] [address]    Culture   Final    NO GROWTH 2 DAYS Performed at [facility], [address], [place], [place] [address]    Report Status PENDING  Incomplete  Blood culture (routine x 2)     Status: None (Preliminary result)   Collection Time: 05/17/22  8:15 PM   Specimen: BLOOD  Result Value Ref Range Status   Specimen Description   Final    BLOOD BLOOD RIGHT HAND Performed at [facility], [address] [place], [place] [address]    Special Requests   Final    BOTTLES DRAWN AEROBIC AND ANAEROBIC Blood Culture results may not be optimal due to an excessive volume of blood received in culture bottles Performed at [facility], [address], [place], [place] [address]    Culture   Final    NO GROWTH 2 DAYS Performed at [facility], [address], [place], [place] [address]    Report Status PENDING  Incomplete  MRSA Next Gen by PCR, Nasal     Status: None   Collection Time: 05/17/22  9:54 PM   Specimen: Nasal Mucosa; Nasal Swab  Result Value Ref Range Status   MRSA by PCR Next Gen NOT DETECTED NOT DETECTED Final    Comment: (NOTE) The GeneXpert MRSA Assay (FDA approved for NASAL specimens only), is one component of a comprehensive MRSA colonization surveillance program. It is not intended to diagnose MRSA infection nor to guide or monitor treatment for MRSA infections. Test performance is not FDA approved in patients less than 24 years old. Performed at [facility], [address], [place], [place] [address]     Studies/Results: No results found.  Medications: Scheduled Meds:  Chlorhexidine Gluconate Cloth  6 each Topical Daily   enoxaparin (LOVENOX) injection  40 mg Subcutaneous Q24H   folic acid  1 mg Oral Daily   HYDROmorphone    Intravenous Q4H   ipratropium-albuterol  3 mL Nebulization BID   ketorolac  15 mg Intravenous Q6H   mouth rinse  15 mL Mouth Rinse 4 times per day   senna-docusate  1 tablet Oral QHS   Continuous Infusions:  ceFEPime (MAXIPIME) IV 200 mL/hr at 05/20/22 0603   PRN Meds:.diphenhydrAMINE, melatonin, naloxone **AND** sodium chloride flush, ondansetron (ZOFRAN) IV, mouth rinse, polyethylene glycol  Consultants: None  Procedures: None  Antibiotics: IV cefepime for possible acute chest versus HCAP  Assessment/Plan: Principal Problem:   Malnutrition of moderate degree Active Problems:   Anemia of chronic disease   Sickle cell pain crisis (HCC)   Atypical chest pain   Acute chest syndrome (HCC)  Acute chest syndrome versus  HCAP: Patient on IV cefepime.  We will continue.  Continue supplemental oxygen.  Continue incentive spirometry.  Patient encouraged to ambulate.  We will possibly discharge patient on Augmentin tomorrow. Hb Sickle Cell Disease with Pain crisis: Reduce IVF to [facility], continue to to wean Dilaudid PCA, continue IV Toradol 15 mg Q 6 H, Monitor vitals very closely, Re-evaluate pain scale regularly, 2 L of Oxygen by [place]. Acute kidney injury: Resolved.  Since patient is having significant side effect of vomiting from Dilaudid, will wean and continue with IV Toradol Anemia of Chronic Disease: Hemoglobin drops below 7 this morning, will transfuse with another 1 unit of packed red blood cell. We will continue to monitor very closely.  Repeat labs in AM. Chronic pain Syndrome: Continue home pain medications.  Code Status: Full Code Family Communication: N/A Disposition Plan: Not yet ready for discharge  Corie Vavra  If 7PM-7AM, please contact night-coverage.  05/20/2022, 12:28 PM  LOS: 3 days

## 2022-05-21 DIAGNOSIS — E440 Moderate protein-calorie malnutrition: Secondary | ICD-10-CM | POA: Diagnosis not present

## 2022-05-21 LAB — BPAM RBC
Blood Product Expiration Date: 202311252359
Blood Product Expiration Date: 202311282359
Blood Product Expiration Date: 202311302359
ISSUE DATE / TIME: 202310262332
ISSUE DATE / TIME: 202310271137
ISSUE DATE / TIME: 202310290847
Unit Type and Rh: 5100
Unit Type and Rh: 5100
Unit Type and Rh: 9500

## 2022-05-21 LAB — BASIC METABOLIC PANEL
Anion gap: 4 — ABNORMAL LOW (ref 5–15)
BUN: 9 mg/dL (ref 8–23)
CO2: 23 mmol/L (ref 22–32)
Calcium: 8.6 mg/dL — ABNORMAL LOW (ref 8.9–10.3)
Chloride: 107 mmol/L (ref 98–111)
Creatinine, Ser: 0.73 mg/dL (ref 0.44–1.00)
GFR, Estimated: >60 mL/min (ref >60)
Glucose, Bld: 90 mg/dL (ref 70–99)
Potassium: 4 mmol/L (ref 3.5–5.1)
Sodium: 134 mmol/L — ABNORMAL LOW (ref 135–145)

## 2022-05-21 LAB — TYPE AND SCREEN
ABO/RH(D): B POS
Antibody Screen: NEGATIVE
Unit division: 0
Unit division: 0
Unit division: 0

## 2022-05-21 LAB — CBC
HCT: 22.2 % — ABNORMAL LOW (ref 36.0–46.0)
Hemoglobin: 7.7 g/dL — ABNORMAL LOW (ref 12.0–15.0)
MCH: 29.3 pg (ref 26.0–34.0)
MCHC: 34.7 g/dL (ref 30.0–36.0)
MCV: 84.4 fL (ref 80.0–100.0)
Platelets: 161 10*3/uL (ref 150–400)
RBC: 2.63 MIL/uL — ABNORMAL LOW (ref 3.87–5.11)
RDW: 21 % — ABNORMAL HIGH (ref 11.5–15.5)
WBC: 6.9 10*3/uL (ref 4.0–10.5)
nRBC: 0.3 % — ABNORMAL HIGH (ref 0.0–0.2)

## 2022-05-21 MED ORDER — IPRATROPIUM-ALBUTEROL 0.5-2.5 (3) MG/3ML IN SOLN: 3.0000 mL | Freq: Four times a day (QID) | RESPIRATORY_TRACT | Status: DC | PRN

## 2022-05-21 MED ORDER — TRAMADOL HCL 50 MG PO TABS: 50.0000 mg | ORAL_TABLET | Freq: Four times a day (QID) | ORAL | 0 refills | Status: AC | PRN

## 2022-05-21 NOTE — Discharge Summary (Incomplete)
Physician Discharge Summary  TYREONA KOCHERSPERGER ZOX:[id number] DOB: [birth year] DOA: 05/17/2022  PCP: Massie Maroon, FNP  Admit date: 05/17/2022  Discharge date: 05/22/2022  Discharge Diagnoses:  Principal Problem:   Malnutrition of moderate degree Active Problems:   Anemia of chronic disease   Sickle cell pain crisis (HCC)   Atypical chest pain   Acute chest syndrome [facility])   Discharge Condition: Stable  Disposition:   Follow-up Information     Massie Maroon, FNP Follow up in 1 week(s).   Specialty: Family Medicine Contact information: [address] [place] [address] [phone]                Pt is discharged home in good condition and is to follow up with Massie Maroon, FNP this week to have labs evaluated. YQMVHQ YARISSA PROVENCE is instructed to increase activity slowly and balance with rest for the next few days, and use prescribed medication to complete treatment of pain  Diet: Regular Wt Readings from Last 3 Encounters:  05/21/22 70.9 kg  05/13/22 63.5 kg  03/27/22 64 kg    History of present illness:  Roselie Bolash is a 61 year old female with a medical history significant for sickle cell disease, chronic pain syndrome, rheumatoid arthritis, vitamin D deficiency, hepatitis C, hypothyroidism, anemia of chronic disease, previous brain aneurysm, history of cholelithiasis status postcholecystectomy presented to [facility] emergency department from home due to right-sided chest pain, involving her right ribs and right upper abdominal pain with onset about 12 PM today and has been constant since that time.  The pain has become progressively worse and not improved with Tylenol at home.  Endorses similar pain a week ago, which resolved with Tylenol.  Pain is worse with taking a deep breath.  Denies nausea or vomiting.  No subjective fevers.  In the ED, work-up revealed concern for acute chest syndrome with CT angio chest abdomen pelvis negative for  pulmonary embolism or aneurysm.  Revealing infiltrates.  Bibasilar subpleural airspace density progressed since prior CT and may represent progression of interstitial lung disease.  Superimposed pneumonia is not excluded.  Maximum temperature 99.6 in the ED.  Hemoglobin 6.5.  Patient started on broad-spectrum IV antibiotics cefepime and IV vancomycin.  1 unit PRBCs ordered to be transfused.  Continue IV hydration.  IV opiate-based analgesics.  [facility] hospitalist service was asked to admit.  Hospital Course:  Acute chest syndrome in the setting of sickle cell pain crisis. Patient presented with right-sided chest pain and was admitted for acute chest syndrome after review of CT angio revealing infiltrates.  At that time, patient had an oxygen requirement and was weaned throughout admission.  Acute chest syndrome resolved.  Patient is alert, oriented, and ambulating.  Oxygen saturation currently 98% on room air.  Patient has no deficits with walking pulse oximetry.  Symptomatic anemia: On admission, patient had a hemoglobin of 6.5 g/dL.  She was transfused 2 units PRBCs throughout.  Prior to discharge, patient's hemoglobin is 7.7 g/dL, above patient's baseline.  Patient will follow-up with PCP in 1 week to repeat CBC with differential.  Sickle cell disease with pain crisis: Patient also admitted for sickle cell pain crisis.  She is opiate nave.  Patient was transition to oral medications from IV Dilaudid.  She states that she does not have any pain on today.  Patient will discharge home tramadol 50 mg #30 was sent to patient's pharmacy.  PDMP reviewed prior to prescribing opiate medications, no inconsistencies  noted.  Patient's vital signs are stable.  Patient ambulating without assistance.  She is aware of all upcoming appointments.  She is accompanied by her daughter who will be her support system upon returning home.  Patient will also follow-up with Abigail Butts, LCSW at [facility] health patient care center as  scheduled. Patient was admitted for sickle cell pain crisis and managed appropriately with IVF, IV Dilaudid via PCA and IV Toradol, as well as other adjunct therapies per sickle cell pain management protocols.  Patient was therefore discharged home today in a hemodynamically stable condition.   Dekota will follow-up with PCP within 1 week of this discharge. Kingsleigh was counseled extensively about nonpharmacologic means of pain management, patient verbalized understanding and was appreciative of  the care received during this admission.   We discussed the need for good hydration, monitoring of hydration status, avoidance of heat, cold, stress, and infection triggers. We discussed the need to be adherent with taking  home medications. Patient was reminded of the need to seek medical attention immediately if any symptom of bleeding, anemia, or infection occurs.  Discharge Exam: Vitals:   05/21/22 1248 05/21/22 1249  BP:  (!) 150/73  Pulse:  (!) 49  Resp:  16  Temp:  98.6 F (37 C)  SpO2: 98% 93%   Vitals:   05/21/22 0846 05/21/22 1103 05/21/22 1248 05/21/22 1249  BP:  131/78  (!) 150/73  Pulse:  (!) 50  (!) 49  Resp: 16 16  16   Temp:  98.5 F (36.9 C)  98.6 F (37 C)  TempSrc:  Oral    SpO2:  92% 98% 93%  Weight:      Height:        General appearance : Awake, alert, not in any distress. Speech Clear. Not toxic looking HEENT: Atraumatic and Normocephalic, pupils equally reactive to light and accomodation Neck: Supple, no JVD. No cervical lymphadenopathy.  Chest: Good air entry bilaterally, no added sounds  CVS: S1 S2 regular, no murmurs.  Abdomen: Bowel sounds present, Non tender and not distended with no gaurding, rigidity or rebound. Extremities: B/L Lower Ext shows no edema, both legs are warm to touch Neurology: Awake alert, and oriented X 3, CN II-XII intact, Non focal Skin: No Rash  Discharge Instructions  Discharge Instructions     Discharge patient   Complete by: As  directed    Discharge disposition: 01-Home or Self Care   Discharge patient date: 05/21/2022      Allergies as of 05/21/2022       Reactions   Ampicillin Other (See Comments)   Caused red bumps on the skin, but nothing else Per patient, she tolerates cephalosporins without problems (??)   Demerol [meperidine] Other (See Comments)   A high(er) dose caused seizure(s)        Medication List     TAKE these medications    acetaminophen 650 MG CR tablet Commonly known as: TYLENOL Take 1,300 mg by mouth every 8 (eight) hours as needed (arthritis pain).   albuterol 108 (90 Base) MCG/ACT inhaler Commonly known as: VENTOLIN HFA Inhale 1 puff into the lungs every 6 (six) hours as needed for shortness of breath.   ASCORBIC ACID PO Take 1 tablet by mouth every morning.   cyclobenzaprine 10 MG tablet Commonly known as: FLEXERIL Take 1 tablet (10 mg total) by mouth at bedtime. Ran out What changed: additional instructions   DULoxetine 30 MG capsule Commonly known as: CYMBALTA Take 1 capsule (30 mg  total) by mouth daily.   folic acid 1 MG tablet Commonly known as: FOLVITE Take 1 tablet (1 mg total) by mouth daily.   hydrOXYzine 10 MG tablet Commonly known as: ATARAX Take 1 tablet (10 mg total) by mouth 3 (three) times daily as needed.   ibuprofen 800 MG tablet Commonly known as: ADVIL Take 1 tablet (800 mg total) by mouth every 8 (eight) hours as needed.   traMADol 50 MG tablet Commonly known as: Ultram Take 1 tablet (50 mg total) by mouth every 6 (six) hours as needed.   VITAMIN D3 PO Take 1 tablet by mouth daily.        The results of significant diagnostics from this hospitalization (including imaging, microbiology, ancillary and laboratory) are listed below for reference.    Significant Diagnostic Studies: ECHOCARDIOGRAM COMPLETE  Result Date: 05/18/2022    ECHOCARDIOGRAM REPORT   Patient Name:   LAKESHIA CURTI Date of Exam: 05/18/2022 Medical Rec #:   [id number]     Height:       71.0 in Accession #:    [id number]    Weight:       144.4 lb Date of Birth:  July 28, 1960     BSA:          1.836 m Patient Age:    61 years      BP:           134/69 mmHg Patient Gender: F             HR:           45 bpm. Exam Location:  Inpatient Procedure: 2D Echo, Cardiac Doppler and Color Doppler Indications:    R07.9* Chest pain, unspecified  History:        Patient has prior history of Echocardiogram examinations, most                 recent 12/03/2012. Arrythmias:WPW; Signs/Symptoms:Chest Pain and                 Dyspnea. Sickle cell anemia.  Sonographer:    Sheralyn Boatman RDCS Referring Phys: [id number] CAROLE N Mcglaun  Sonographer Comments: Technically difficult study due to poor echo windows. IMPRESSIONS  1. Left ventricular ejection fraction, by estimation, is 55 to 60%. The left ventricle has normal function. The left ventricle has no regional wall motion abnormalities. Left ventricular diastolic parameters were normal.  2. Right ventricular systolic function is normal. The right ventricular size is moderately enlarged. There is normal pulmonary artery systolic pressure. The estimated right ventricular systolic pressure is 26.2 mmHg.  3. Left atrial size was severely dilated.  4. Right atrial size was severely dilated.  5. The mitral valve is normal in structure. Moderate mitral valve regurgitation. No evidence of mitral stenosis.  6. The aortic valve is normal in structure. Aortic valve regurgitation is not visualized. No aortic stenosis is present.  7. The inferior vena cava is dilated in size with <50% respiratory variability, suggesting right atrial pressure of 15 mmHg. FINDINGS  Left Ventricle: Left ventricular ejection fraction, by estimation, is 55 to 60%. The left ventricle has normal function. The left ventricle has no regional wall motion abnormalities. The left ventricular internal cavity size was normal in size. There is  no left ventricular hypertrophy. Left ventricular  diastolic parameters were normal. Right Ventricle: The right ventricular size is moderately enlarged. No increase in right ventricular wall thickness. Right ventricular systolic function is normal. There is normal pulmonary artery systolic pressure.  The tricuspid regurgitant velocity is 1.67 m/s, and with an assumed right atrial pressure of 15 mmHg, the estimated right ventricular systolic pressure is 26.2 mmHg. Left Atrium: Left atrial size was severely dilated. Right Atrium: Right atrial size was severely dilated. Pericardium: There is no evidence of pericardial effusion. Mitral Valve: The mitral valve is normal in structure. Moderate mitral valve regurgitation. No evidence of mitral valve stenosis. Tricuspid Valve: The tricuspid valve is normal in structure. Tricuspid valve regurgitation is mild . No evidence of tricuspid stenosis. Aortic Valve: The aortic valve is normal in structure. Aortic valve regurgitation is not visualized. No aortic stenosis is present. Pulmonic Valve: The pulmonic valve was normal in structure. Pulmonic valve regurgitation is not visualized. No evidence of pulmonic stenosis. Aorta: The aortic root is normal in size and structure. Venous: The inferior vena cava is dilated in size with less than 50% respiratory variability, suggesting right atrial pressure of 15 mmHg. IAS/Shunts: No atrial level shunt detected by color flow Doppler.  LEFT VENTRICLE PLAX 2D LVIDd:         5.30 cm      Diastology LVIDs:         3.30 cm      LV e' medial:    7.94 cm/s LV PW:         1.10 cm      LV E/e' medial:  10.5 LV IVS:        1.00 cm      LV e' lateral:   13.90 cm/s LVOT diam:     2.30 cm      LV E/e' lateral: 6.0 LV SV:         110 LV SV Index:   60 LVOT Area:     4.15 cm  LV Volumes (MOD) LV vol d, MOD A2C: 185.0 ml LV vol d, MOD A4C: 181.0 ml LV vol s, MOD A2C: 72.0 ml LV vol s, MOD A4C: 72.5 ml LV SV MOD A2C:     113.0 ml LV SV MOD A4C:     181.0 ml LV SV MOD BP:      111.8 ml RIGHT VENTRICLE              IVC RV S prime:     13.60 cm/s  IVC diam: 2.50 cm TAPSE (M-mode): 2.5 cm LEFT ATRIUM              Index        RIGHT ATRIUM           Index LA diam:        4.30 cm  2.34 cm/m   RA Area:     29.30 cm LA Vol (A2C):   131.0 ml 71.35 ml/m  RA Volume:   112.00 ml 61.00 ml/m LA Vol (A4C):   138.0 ml 75.16 ml/m LA Biplane Vol: 136.0 ml 74.08 ml/m  AORTIC VALVE LVOT Vmax:   116.00 cm/s LVOT Vmean:  69.700 cm/s LVOT VTI:    0.264 m  AORTA Ao Root diam: 3.10 cm Ao Asc diam:  3.40 cm MITRAL VALVE                  TRICUSPID VALVE MV Area (PHT): 2.83 cm       TR Peak grad:   11.2 mmHg MV Decel Time: 268 msec       TR Vmax:        167.00 cm/s MR Peak grad:    116.4 mmHg MR Mean  grad:    84.5 mmHg    SHUNTS MR Vmax:         539.50 cm/s  Systemic VTI:  0.26 m MR Vmean:        447.5 cm/s   Systemic Diam: 2.30 cm MR PISA:         1.57 cm MR PISA Eff ROA: 11 mm MR PISA Radius:  0.50 cm MV E velocity: 83.20 cm/s MV A velocity: 54.80 cm/s MV E/A ratio:  1.52 Donato Schultz MD Electronically signed by Donato Schultz MD Signature Date/Time: 05/18/2022/3:07:46 PM    Final    CT Angio Chest/Abd/Pel for Dissection W and/or Wo Contrast  Result Date: 05/17/2022 CLINICAL DATA:  Acute aortic syndrome suspected. EXAM: CT ANGIOGRAPHY CHEST, ABDOMEN AND PELVIS TECHNIQUE: Non-contrast CT of the chest was initially obtained. Multidetector CT imaging through the chest, abdomen and pelvis was performed using the standard protocol during bolus administration of intravenous contrast. Multiplanar reconstructed images and MIPs were obtained and reviewed to evaluate the vascular anatomy. RADIATION DOSE REDUCTION: This exam was performed according to the departmental dose-optimization program which includes automated exposure control, adjustment of the mA and/or kV according to patient size and/or use of iterative reconstruction technique. CONTRAST:  75mL OMNIPAQUE IOHEXOL 350 MG/ML SOLN COMPARISON:  Chest CT dated 01/29/2020. FINDINGS: CTA  CHEST FINDINGS Cardiovascular: Borderline cardiomegaly. Trace pericardial effusion measuring approximately 5 mm in thickness anterior to the heart. Mild atherosclerotic calcification of the aortic arch. No aneurysmal dilatation or dissection. The origins of the great vessels of the aortic arch appear patent. No pulmonary artery embolus identified. Mediastinum/Nodes: No hilar or mediastinal adenopathy. The esophagus is grossly unremarkable. No mediastinal fluid collection. Lungs/Pleura: Bibasilar subpleural airspace densities, right greater than left, have progressed since the prior CT and may represent progression of interstitial lung disease. Superimposed pneumonia is not excluded. Clinical correlation is recommended. Similar appearance of irregular nodule in the right apex, likely post inflammatory scarring. There is no pleural effusion or pneumothorax. The central airways are patent. Musculoskeletal: Osteopenia.  No acute osseous pathology. Review of the MIP images confirms the above findings. CTA ABDOMEN AND PELVIS FINDINGS VASCULAR Aorta: Normal caliber aorta without aneurysm, dissection, vasculitis or significant stenosis. Celiac: High-grade narrowing of the proximal celiac axis may be related to compression by diaphragmatic crus. The celiac axis and its major branches remain patent. SMA: Patent without evidence of aneurysm, dissection, vasculitis or significant stenosis. Renals: Both renal arteries are patent without evidence of aneurysm, dissection, vasculitis, fibromuscular dysplasia or significant stenosis. IMA: Patent without evidence of aneurysm, dissection, vasculitis or significant stenosis. Inflow: Patent without evidence of aneurysm, dissection, vasculitis or significant stenosis. Veins: No obvious venous abnormality within the limitations of this arterial phase study. Review of the MIP images confirms the above findings. NON-VASCULAR No intra-abdominal free air or free fluid. Hepatobiliary:  Cirrhosis. Mild dilatation of the biliary tree, likely post cholecystectomy. No retained calcified stone noted in the central CBD. Pancreas: Unremarkable. No pancreatic ductal dilatation or surrounding inflammatory changes. Spleen: The spleen is not visualized and may be surgically absent or related to other splenectomy. Adrenals/Urinary Tract: The adrenal glands, kidneys, visualized ureters appear unremarkable. The urinary bladder is distended. Stomach/Bowel: There is moderate stool throughout the colon. There is no bowel obstruction or active inflammation. The appendix is not visualized with certainty. No inflammatory changes identified in the right lower quadrant. Lymphatic: No adenopathy. Reproductive: The uterus is grossly unremarkable. No adnexal masses. Other: Small fat containing umbilical hernia. Musculoskeletal: Osteopenia with degenerative changes  of the spine. No acute osseous pathology. Review of the MIP images confirms the above findings. IMPRESSION: 1. No aortic aneurysm or dissection. 2. Bibasilar subpleural airspace densities progressed since the prior CT and may represent progression of interstitial lung disease. Superimposed pneumonia is not excluded. 3. Cirrhosis. 4. No bowel obstruction. 5.  Aortic Atherosclerosis (ICD10-I70.0). Electronically Signed   By: Elgie Collard M.D.   On: 05/17/2022 18:49   DG Chest 2 View  Result Date: 05/17/2022 CLINICAL DATA:  chest pain EXAM: CHEST - 2 VIEW COMPARISON:  Chest x-ray 05/13/2022, CT chest 01/29/2020 FINDINGS: Persistent mild cardiomegaly. The heart and mediastinal contours are unchanged. Aortic calcification. No focal consolidation. Mild pulmonary edema. No pleural effusion. No pneumothorax. No acute osseous abnormality. IMPRESSION: Mild pulmonary edema. Electronically Signed   By: Tish Frederickson M.D.   On: 05/17/2022 15:30   DG Pelvis 1-2 Views  Result Date: 05/13/2022 CLINICAL DATA:  Bilateral hip pain.  History of sickle cell disease.  EXAM: PELVIS - 1-2 VIEW COMPARISON:  CT 12/01/2019 FINDINGS: There is no evidence of pelvic fracture or diastasis. No pelvic bone lesions are seen. IMPRESSION: Negative. Electronically Signed   By: Burman Nieves M.D.   On: 05/13/2022 21:18   DG Chest Portable 1 View  Result Date: 05/13/2022 CLINICAL DATA:  Shortness of breath EXAM: PORTABLE CHEST 1 VIEW COMPARISON:  03/24/21 CXR FINDINGS: No pleural effusion. No pneumothorax. Cardiomegaly, similar to prior. Mild pulmonary edema. No new focal airspace opacity. No displaced rib fractures. Visualized upper abdomen is unremarkable. IMPRESSION: Cardiomegaly with mild pulmonary edema. No new focal airspace opacity. Electronically Signed   By: Lorenza Cambridge M.D.   On: 05/13/2022 19:37    Microbiology: Recent Results (from the past 240 hour(s))  Blood culture (routine x 2)     Status: None   Collection Time: 05/17/22  8:10 PM   Specimen: BLOOD  Result Value Ref Range Status   Specimen Description   Final    BLOOD RIGHT ANTECUBITAL Performed at [facility], [address], [place], [place] [address]    Special Requests   Final    BOTTLES DRAWN AEROBIC AND ANAEROBIC Blood Culture results may not be optimal due to an excessive volume of blood received in culture bottles Performed at [facility], [address] [place], [place] [address]    Culture   Final    NO GROWTH 5 DAYS Performed at [facility], [address] [place], [place] [address]    Report Status 05/22/2022 FINAL  Final  Blood culture (routine x 2)     Status: None   Collection Time: 05/17/22  8:15 PM   Specimen: BLOOD  Result Value Ref Range Status   Specimen Description   Final    BLOOD BLOOD RIGHT HAND Performed at [facility], [address], [place], [place] [address]    Special Requests   Final    BOTTLES DRAWN AEROBIC AND ANAEROBIC Blood Culture results may not be optimal due to an excessive volume of  blood received in culture bottles Performed at [facility], [address] [place], [place] [address]    Culture   Final    NO GROWTH 5 DAYS Performed at [facility], [address], [place], [place] [address]    Report Status 05/22/2022 FINAL  Final  MRSA Next Gen by PCR, Nasal     Status: None   Collection Time: 05/17/22  9:54 PM  Specimen: Nasal Mucosa; Nasal Swab  Result Value Ref Range Status   MRSA by PCR Next Gen NOT DETECTED NOT DETECTED Final    Comment: (NOTE) The GeneXpert MRSA Assay (FDA approved for NASAL specimens only), is one component of a comprehensive MRSA colonization surveillance program. It is not intended to diagnose MRSA infection nor to guide or monitor treatment for MRSA infections. Test performance is not FDA approved in patients less than 18 years old. Performed at [facility], [address] [place], [place] [address]      Labs: Basic Metabolic Panel: Recent Labs  Lab 05/17/22 1456 05/17/22 2010 05/18/22 0309 05/19/22 0314 05/21/22 0552  NA 138  --  136 136 134*  K 3.8  --  3.5 4.0 4.0  CL 108  --  108 107 107  CO2 21*  --  22 22 23   GLUCOSE 94  --  109* 95 90  BUN 12  --  12 6* 9  CREATININE 1.12* 1.13* 0.82 0.67 0.73  CALCIUM 8.9  --  8.6* 8.6* 8.6*  MG  --   --  2.3  --   --   PHOS  --   --  3.6  --   --    Liver Function Tests: Recent Labs  Lab 05/17/22 1456 05/18/22 0309  AST 68* 62*  ALT 14 13  ALKPHOS 61 53  BILITOT 3.8* 2.9*  PROT 7.7 7.5  ALBUMIN 3.8 3.4*   Recent Labs  Lab 05/17/22 1456  LIPASE 111*   No results for input(s): "AMMONIA" in the last 168 hours. CBC: Recent Labs  Lab 05/17/22 1456 05/17/22 2010 05/18/22 0309 05/18/22 0726 05/18/22 1613 05/19/22 0314 05/20/22 0542 05/20/22 1606 05/21/22 0552  WBC 11.5* 11.1* 10.0  --   --  9.3 7.4  --  6.9  NEUTROABS 7.6  --  7.1  --   --   --   --   --   --   HGB 6.5* 5.4* 6.6*   < > 7.2* 7.2* 6.9* 7.9* 7.7*   HCT 17.5* 14.7* 18.2*   < > 20.3* 20.5* 19.4* 22.9* 22.2*  MCV 86.2 86.5 86.7  --   --  85.4 85.5  --  84.4  PLT 188 142* 165  --   --  155 153  --  161   < > = values in this interval not displayed.   Cardiac Enzymes: No results for input(s): "CKTOTAL", "CKMB", "CKMBINDEX", "TROPONINI" in the last 168 hours. BNP: Invalid input(s): "POCBNP" CBG: No results for input(s): "GLUCAP" in the last 168 hours.  Time coordinating discharge: 30 minutes  Signed:  Nolon Nations  APRN, MSN, FNP-C Patient Care [facility] [address] [place], [place] [address] [phone]  [facility] 05/22/2022, 7:49 PM

## 2022-05-21 NOTE — Care Management Important Message (Signed)
Important Message  Patient Details IM Letter given Name: [name] MRN: [id number] Date of Birth: [birth year]   Medicare Important Message Given:  Yes     Kerin Salen 05/21/2022, 1:38 PM

## 2022-05-21 NOTE — Progress Notes (Signed)
Patient PCA pump discontinued and 59ml of remaining syringe wasted in stericycle in Med room with Arboriculturist.

## 2022-05-21 NOTE — TOC Progression Note (Signed)
Transition of Care [facility]) - Progression Note    Patient Details  Name: [name] MRN: [id number] Date of Birth: May 03, 1961  Transition of Care [facility]) CM/SW [employer], RN Phone Number:[phone]  05/21/2022, 10:37 AM  Clinical Narrative:    TOC acknowledges consult for "Please see answers to SDOH questions. Pt has food insecurities, problems with transportation and housing. Please provide available assistance." Per [facility] documentation patient has been assessed and resources have been provided.    Expected Discharge Plan: Homeless Shelter Barriers to Discharge: Continued Medical Work up  Expected Discharge Plan and Services Expected Discharge Plan: Homeless Shelter In-house Referral: Clinical Social Work   Post Acute Care Choice: NA Living arrangements for the past 2 months: Homeless                 DME Arranged: N/A DME Agency: NA       HH Arranged: NA [place] Agency: NA         Social Determinants of Health (SDOH) Interventions    Readmission Risk Interventions    05/18/2022   11:07 AM  Readmission Risk Prevention Plan  Transportation Screening Complete  Social Work Consult for [place] Planning/Counseling Complete

## 2022-05-22 LAB — CULTURE, BLOOD (ROUTINE X 2)
Culture: NO GROWTH
Culture: NO GROWTH

## 2022-05-23 ENCOUNTER — Telehealth: Payer: Self-pay

## 2022-05-23 NOTE — Telephone Encounter (Signed)
Transition Care Management Follow-up Telephone Call Date of discharge and from where: /05/21/22 How have you been since you were released from the hospital? Still feeling light headed. Any questions or concerns? No  Items Reviewed: Did the pt receive and understand the discharge instructions provided? Yes  Medications obtained and verified? Yes  Other? No Any new allergies since your discharge? No  Dietary orders reviewed? No Do you have support at home? Yes    Follow up appointments reviewed:  PCP Hospital f/u appt confirmed? Yes  Scheduled to see Lazaro Arms ,NP  on 05/30/22 @ 11:20. [facility] f/u appt confirmed? No   Are transportation arrangements needed? Yes  If their condition worsens, is the pt aware to call PCP or go to the Emergency Dept.? Yes Was the patient provided with contact information for the PCP's office or ED? Yes Was to pt encouraged to call back with questions or concerns? Yes  Elyse Jarvis RMA

## 2022-05-24 ENCOUNTER — Encounter (HOSPITAL_COMMUNITY): Payer: Medicare Other

## 2022-05-29 DIAGNOSIS — M069 Rheumatoid arthritis, unspecified: Secondary | ICD-10-CM | POA: Diagnosis not present

## 2022-05-29 DIAGNOSIS — Z1159 Encounter for screening for other viral diseases: Secondary | ICD-10-CM | POA: Diagnosis not present

## 2022-05-29 DIAGNOSIS — Z79899 Other long term (current) drug therapy: Secondary | ICD-10-CM | POA: Diagnosis not present

## 2022-05-30 ENCOUNTER — Ambulatory Visit (INDEPENDENT_AMBULATORY_CARE_PROVIDER_SITE_OTHER): Payer: Medicare Other | Admitting: Nurse Practitioner

## 2022-05-30 ENCOUNTER — Ambulatory Visit (HOSPITAL_COMMUNITY)
Admission: RE | Admit: 2022-05-30 | Discharge: 2022-05-30 | Disposition: A | Discharge status: Home / Self Care | Payer: Medicare Other | Source: Ambulatory Visit | Attending: Nurse Practitioner | Admitting: Nurse Practitioner

## 2022-05-30 ENCOUNTER — Encounter: Payer: Self-pay | Admitting: Nurse Practitioner

## 2022-05-30 VITALS — BP 131/66 | Temp 98.6°F | Ht 71.0 in | Wt 148.0 lb

## 2022-05-30 DIAGNOSIS — G8929 Other chronic pain: Secondary | ICD-10-CM | POA: Insufficient documentation

## 2022-05-30 DIAGNOSIS — R42 Dizziness and giddiness: Secondary | ICD-10-CM | POA: Diagnosis not present

## 2022-05-30 DIAGNOSIS — M25511 Pain in right shoulder: Secondary | ICD-10-CM

## 2022-05-30 MED ORDER — MECLIZINE HCL 12.5 MG PO TABS: 12.5000 mg | ORAL_TABLET | Freq: Three times a day (TID) | ORAL | 0 refills | Status: DC | PRN

## 2022-05-30 NOTE — Patient Instructions (Signed)
1. Chronic right shoulder pain  - DG Shoulder Right  2. Vertigo  - meclizine (ANTIVERT) 12.5 MG tablet; Take 1 tablet (12.5 mg total) by mouth 3 (three) times daily as needed for dizziness.  Dispense: 30 tablet; Refill: 0  Follow up:  Follow up in 2 weeks with PCP

## 2022-05-30 NOTE — Progress Notes (Signed)
@Patient  ID: , female    DOB: 1960-08-11, 61 y.o.   MRN: [id number]  Chief Complaint  Patient presents with   Hospitalization Follow-up    Pt stated--right shoulder still having pain.   Dizziness    Pt stated dizziness especially when getting up and walk-    Referring provider: [id number], FNP  Recent significant events:  Admit date: 05/17/2022   Discharge date: 05/21/2022   Discharge Diagnoses:  Principal Problem:   Malnutrition of moderate degree Active Problems:   Anemia of chronic disease   Sickle cell pain crisis (HCC)   Atypical chest pain   Acute chest syndrome [facility])  Hospital Course:  Acute chest syndrome in the setting of sickle cell pain crisis. Patient presented with right-sided chest pain and was admitted for acute chest syndrome after review of CT angio revealing infiltrates.  At that time, patient had an oxygen requirement and was weaned throughout admission.  Acute chest syndrome resolved.  Patient is alert, oriented, and ambulating.  Oxygen saturation currently 98% on room air.  Patient has no deficits with walking pulse oximetry.   Symptomatic anemia: On admission, patient had a hemoglobin of 6.5 g/dL.  She was transfused 2 units PRBCs throughout.  Prior to discharge, patient's hemoglobin is 7.7 g/dL, above patient's baseline.  Patient will follow-up with PCP in 1 week to repeat CBC with differential.   Sickle cell disease with pain crisis: Patient also admitted for sickle cell pain crisis.  She is opiate nave.  Patient was transition to oral medications from IV Dilaudid.  She states that she does not have any pain on today.  Patient will discharge home tramadol 50 mg #30 was sent to patient's pharmacy.  PDMP reviewed prior to prescribing opiate medications, no inconsistencies noted.   HPI  Patient presents today for hospital follow-up.  Patient was seen by her rheumatologist yesterday and had complete there.  Labs continue to be  stable.  Patient states that she does continue to to have dizziness.  She states that she notices this more when went from sitting to standing.  We will trial meclizine.  Patient also complains of right shoulder pain.  Patient states that she fell in 2021 and injured her right shoulder.  She states that she has been having pain in the right shoulder since that time.  We will check an x-ray today. Denies f/c/s, n/v/d, hemoptysis, PND, leg swelling Denies chest pain or edema     Allergies  Allergen Reactions   Ampicillin Other (See Comments)    Caused red bumps on the skin, but nothing else Per patient, she tolerates cephalosporins without problems (??)   Demerol [Meperidine] Other (See Comments)    A high(er) dose caused seizure(s)    Immunization History  Administered Date(s) Administered   Hepatitis B 05/18/2015   Influenza-Unspecified 04/24/2007   Meningococcal Conjugate 01/09/2007   PFIZER(Purple Top)SARS-COV-2 Vaccination 05/06/2020, 06/03/2020   Pneumococcal Conjugate-13 05/18/2015   Pneumococcal Polysaccharide-23 05/11/2014   Tdap 09/24/2017    Past Medical History:  Diagnosis Date   Aneurysm (HCC)    Anxiety    Arthritis    Colon ulcer    Gall stones    Hypokalemia    Hypothyroidism    Pneumonia    Sickle cell anemia (HCC)    Ulcers of both lower extremities (HCC)     Tobacco History: Social History   Tobacco Use  Smoking Status Never  Smokeless Tobacco Never   Counseling given: Not Answered  Outpatient Encounter Medications as of 05/30/2022  Medication Sig   Cholecalciferol (VITAMIN D3 PO) Take 1 tablet by mouth daily.   cyanocobalamin (VITAMIN B12) 500 MCG tablet Take 500 mcg by mouth daily.   cyclobenzaprine (FLEXERIL) 10 MG tablet Take 1 tablet (10 mg total) by mouth at bedtime. Ran out (Patient taking differently: Take 10 mg by mouth at bedtime.)   folic acid (FOLVITE) 1 MG tablet Take 1 tablet (1 mg total) by mouth daily.   hydrOXYzine (ATARAX) 10  MG tablet Take 1 tablet (10 mg total) by mouth 3 (three) times daily as needed.   ibuprofen (ADVIL) 800 MG tablet Take 1 tablet (800 mg total) by mouth every 8 (eight) hours as needed.   levothyroxine (SYNTHROID) 175 MCG tablet Take 175 mcg by mouth daily before breakfast.   meclizine (ANTIVERT) 12.5 MG tablet Take 1 tablet (12.5 mg total) by mouth 3 (three) times daily as needed for dizziness.   traMADol (ULTRAM) 50 MG tablet Take 1 tablet (50 mg total) by mouth every 6 (six) hours as needed.   ASCORBIC ACID PO Take 1 tablet by mouth every morning.   DULoxetine (CYMBALTA) 30 MG capsule Take 1 capsule (30 mg total) by mouth daily. (Patient not taking: Reported on 05/17/2022)   [DISCONTINUED] acetaminophen (TYLENOL) 650 MG CR tablet Take 1,300 mg by mouth every 8 (eight) hours as needed (arthritis pain).   [DISCONTINUED] albuterol (VENTOLIN HFA) 108 (90 Base) MCG/ACT inhaler Inhale 1 puff into the lungs every 6 (six) hours as needed for shortness of breath.   No facility-administered encounter medications on file as of 05/30/2022.     Review of Systems  Review of Systems  Constitutional: Negative.   HENT: Negative.    Cardiovascular: Negative.   Gastrointestinal: Negative.   Musculoskeletal:        Right shoulder pain  Allergic/Immunologic: Negative.   Neurological:  Positive for dizziness.  Psychiatric/Behavioral: Negative.         Physical Exam  BP 131/66   Temp 98.6 F (37 C)   Ht 5\' 11"  (1.803 m)   Wt 148 lb (67.1 kg)   SpO2 96%   BMI 20.64 kg/m   Wt Readings from Last 5 Encounters:  05/30/22 148 lb (67.1 kg)  05/21/22 156 lb 4.9 oz (70.9 kg)  05/13/22 140 lb (63.5 kg)  03/27/22 141 lb 3.2 oz (64 kg)  03/17/22 134 lb (60.8 kg)     Physical Exam Vitals and nursing note reviewed.  Constitutional:      General: She is not in acute distress.    Appearance: She is well-developed.  Cardiovascular:     Rate and Rhythm: Normal rate and regular rhythm.  Pulmonary:      Effort: Pulmonary effort is normal.     Breath sounds: Normal breath sounds.  Neurological:     Mental Status: She is alert and oriented to person, place, and time.      Lab Results:  CBC    Component Value Date/Time   WBC 6.9 05/21/2022 0552   RBC 2.63 (L) 05/21/2022 0552   HGB 7.7 (L) 05/21/2022 0552   HGB 6.5 (LL) 03/27/2022 1107   HCT 22.2 (L) 05/21/2022 0552   HCT 18.3 (L) 03/27/2022 1107   PLT 161 05/21/2022 0552   PLT 245 03/27/2022 1107   MCV 84.4 05/21/2022 0552   MCV 86 03/27/2022 1107   MCH 29.3 05/21/2022 0552   MCHC 34.7 05/21/2022 0552   RDW 21.0 (H) 05/21/2022 05/23/2022  RDW 19.6 (H) 03/27/2022 1107   LYMPHSABS 1.5 05/18/2022 0309   LYMPHSABS 2.6 03/27/2022 1107   MONOABS 1.2 (H) 05/18/2022 0309   EOSABS 0.1 05/18/2022 0309   EOSABS 0.6 (H) 03/27/2022 1107   BASOSABS 0.1 05/18/2022 0309   BASOSABS 0.1 03/27/2022 1107    BMET    Component Value Date/Time   NA 134 (L) 05/21/2022 0552   NA 140 03/27/2022 1107   K 4.0 05/21/2022 0552   CL 107 05/21/2022 0552   CO2 23 05/21/2022 0552   GLUCOSE 90 05/21/2022 0552   BUN 9 05/21/2022 0552   BUN 3 (L) 03/27/2022 1107   CREATININE 0.73 05/21/2022 0552   CREATININE 0.64 08/19/2020 1003   CALCIUM 8.6 (L) 05/21/2022 0552   GFRNONAA >60 05/21/2022 0552   GFRNONAA 97 08/19/2020 1003   GFRAA 112 08/19/2020 1003    BNP    Component Value Date/Time   BNP 34.6 05/17/2022 1456   BNP 42.1 12/02/2012 1230    ProBNP    Component Value Date/Time   PROBNP 214.0 (H) 05/10/2012 0953    Imaging: ECHOCARDIOGRAM COMPLETE  Result Date: 05/18/2022    ECHOCARDIOGRAM REPORT   Patient Name:   TAURA LAMARRE Date of Exam: 05/18/2022 Medical Rec #:  [id number]     Height:       71.0 in Accession #:    [id number]    Weight:       144.4 lb Date of Birth:  1961-07-15     BSA:          1.836 m Patient Age:    61 years      BP:           134/69 mmHg Patient Gender: F             HR:           45 bpm. Exam Location:  Inpatient  Procedure: 2D Echo, Cardiac Doppler and Color Doppler Indications:    R07.9* Chest pain, unspecified  History:        Patient has prior history of Echocardiogram examinations, most                 recent 12/03/2012. Arrythmias:WPW; Signs/Symptoms:Chest Pain and                 Dyspnea. Sickle cell anemia.  Sonographer:    Sheralyn Boatman RDCS Referring Phys: [id number] CAROLE N Penning  Sonographer Comments: Technically difficult study due to poor echo windows. IMPRESSIONS  1. Left ventricular ejection fraction, by estimation, is 55 to 60%. The left ventricle has normal function. The left ventricle has no regional wall motion abnormalities. Left ventricular diastolic parameters were normal.  2. Right ventricular systolic function is normal. The right ventricular size is moderately enlarged. There is normal pulmonary artery systolic pressure. The estimated right ventricular systolic pressure is 26.2 mmHg.  3. Left atrial size was severely dilated.  4. Right atrial size was severely dilated.  5. The mitral valve is normal in structure. Moderate mitral valve regurgitation. No evidence of mitral stenosis.  6. The aortic valve is normal in structure. Aortic valve regurgitation is not visualized. No aortic stenosis is present.  7. The inferior vena cava is dilated in size with <50% respiratory variability, suggesting right atrial pressure of 15 mmHg. FINDINGS  Left Ventricle: Left ventricular ejection fraction, by estimation, is 55 to 60%. The left ventricle has normal function. The left ventricle has no regional wall motion abnormalities. The left ventricular  internal cavity size was normal in size. There is  no left ventricular hypertrophy. Left ventricular diastolic parameters were normal. Right Ventricle: The right ventricular size is moderately enlarged. No increase in right ventricular wall thickness. Right ventricular systolic function is normal. There is normal pulmonary artery systolic pressure. The tricuspid regurgitant  velocity is 1.67 m/s, and with an assumed right atrial pressure of 15 mmHg, the estimated right ventricular systolic pressure is 26.2 mmHg. Left Atrium: Left atrial size was severely dilated. Right Atrium: Right atrial size was severely dilated. Pericardium: There is no evidence of pericardial effusion. Mitral Valve: The mitral valve is normal in structure. Moderate mitral valve regurgitation. No evidence of mitral valve stenosis. Tricuspid Valve: The tricuspid valve is normal in structure. Tricuspid valve regurgitation is mild . No evidence of tricuspid stenosis. Aortic Valve: The aortic valve is normal in structure. Aortic valve regurgitation is not visualized. No aortic stenosis is present. Pulmonic Valve: The pulmonic valve was normal in structure. Pulmonic valve regurgitation is not visualized. No evidence of pulmonic stenosis. Aorta: The aortic root is normal in size and structure. Venous: The inferior vena cava is dilated in size with less than 50% respiratory variability, suggesting right atrial pressure of 15 mmHg. IAS/Shunts: No atrial level shunt detected by color flow Doppler.  LEFT VENTRICLE PLAX 2D LVIDd:         5.30 cm      Diastology LVIDs:         3.30 cm      LV e' medial:    7.94 cm/s LV PW:         1.10 cm      LV E/e' medial:  10.5 LV IVS:        1.00 cm      LV e' lateral:   13.90 cm/s LVOT diam:     2.30 cm      LV E/e' lateral: 6.0 LV SV:         110 LV SV Index:   60 LVOT Area:     4.15 cm  LV Volumes (MOD) LV vol d, MOD A2C: 185.0 ml LV vol d, MOD A4C: 181.0 ml LV vol s, MOD A2C: 72.0 ml LV vol s, MOD A4C: 72.5 ml LV SV MOD A2C:     113.0 ml LV SV MOD A4C:     181.0 ml LV SV MOD BP:      111.8 ml RIGHT VENTRICLE             IVC RV S prime:     13.60 cm/s  IVC diam: 2.50 cm TAPSE (M-mode): 2.5 cm LEFT ATRIUM              Index        RIGHT ATRIUM           Index LA diam:        4.30 cm  2.34 cm/m   RA Area:     29.30 cm LA Vol (A2C):   131.0 ml 71.35 ml/m  RA Volume:   112.00 ml 61.00  ml/m LA Vol (A4C):   138.0 ml 75.16 ml/m LA Biplane Vol: 136.0 ml 74.08 ml/m  AORTIC VALVE LVOT Vmax:   116.00 cm/s LVOT Vmean:  69.700 cm/s LVOT VTI:    0.264 m  AORTA Ao Root diam: 3.10 cm Ao Asc diam:  3.40 cm MITRAL VALVE                  TRICUSPID VALVE  MV Area (PHT): 2.83 cm       TR Peak grad:   11.2 mmHg MV Decel Time: 268 msec       TR Vmax:        167.00 cm/s MR Peak grad:    116.4 mmHg MR Mean grad:    84.5 mmHg    SHUNTS MR Vmax:         539.50 cm/s  Systemic VTI:  0.26 m MR Vmean:        447.5 cm/s   Systemic Diam: 2.30 cm MR PISA:         1.57 cm MR PISA Eff ROA: 11 mm MR PISA Radius:  0.50 cm MV E velocity: 83.20 cm/s MV A velocity: 54.80 cm/s MV E/A ratio:  1.52 Donato Schultz MD Electronically signed by Donato Schultz MD Signature Date/Time: 05/18/2022/3:07:46 PM    Final    CT Angio Chest/Abd/Pel for Dissection W and/or Wo Contrast  Result Date: 05/17/2022 CLINICAL DATA:  Acute aortic syndrome suspected. EXAM: CT ANGIOGRAPHY CHEST, ABDOMEN AND PELVIS TECHNIQUE: Non-contrast CT of the chest was initially obtained. Multidetector CT imaging through the chest, abdomen and pelvis was performed using the standard protocol during bolus administration of intravenous contrast. Multiplanar reconstructed images and MIPs were obtained and reviewed to evaluate the vascular anatomy. RADIATION DOSE REDUCTION: This exam was performed according to the departmental dose-optimization program which includes automated exposure control, adjustment of the mA and/or kV according to patient size and/or use of iterative reconstruction technique. CONTRAST:  34mL OMNIPAQUE IOHEXOL 350 MG/ML SOLN COMPARISON:  Chest CT dated 01/29/2020. FINDINGS: CTA CHEST FINDINGS Cardiovascular: Borderline cardiomegaly. Trace pericardial effusion measuring approximately 5 mm in thickness anterior to the heart. Mild atherosclerotic calcification of the aortic arch. No aneurysmal dilatation or dissection. The origins of the great vessels  of the aortic arch appear patent. No pulmonary artery embolus identified. Mediastinum/Nodes: No hilar or mediastinal adenopathy. The esophagus is grossly unremarkable. No mediastinal fluid collection. Lungs/Pleura: Bibasilar subpleural airspace densities, right greater than left, have progressed since the prior CT and may represent progression of interstitial lung disease. Superimposed pneumonia is not excluded. Clinical correlation is recommended. Similar appearance of irregular nodule in the right apex, likely post inflammatory scarring. There is no pleural effusion or pneumothorax. The central airways are patent. Musculoskeletal: Osteopenia.  No acute osseous pathology. Review of the MIP images confirms the above findings. CTA ABDOMEN AND PELVIS FINDINGS VASCULAR Aorta: Normal caliber aorta without aneurysm, dissection, vasculitis or significant stenosis. Celiac: High-grade narrowing of the proximal celiac axis may be related to compression by diaphragmatic crus. The celiac axis and its major branches remain patent. SMA: Patent without evidence of aneurysm, dissection, vasculitis or significant stenosis. Renals: Both renal arteries are patent without evidence of aneurysm, dissection, vasculitis, fibromuscular dysplasia or significant stenosis. IMA: Patent without evidence of aneurysm, dissection, vasculitis or significant stenosis. Inflow: Patent without evidence of aneurysm, dissection, vasculitis or significant stenosis. Veins: No obvious venous abnormality within the limitations of this arterial phase study. Review of the MIP images confirms the above findings. NON-VASCULAR No intra-abdominal free air or free fluid. Hepatobiliary: Cirrhosis. Mild dilatation of the biliary tree, likely post cholecystectomy. No retained calcified stone noted in the central CBD. Pancreas: Unremarkable. No pancreatic ductal dilatation or surrounding inflammatory changes. Spleen: The spleen is not visualized and may be surgically  absent or related to other splenectomy. Adrenals/Urinary Tract: The adrenal glands, kidneys, visualized ureters appear unremarkable. The urinary bladder is distended. Stomach/Bowel: There is moderate stool  throughout the colon. There is no bowel obstruction or active inflammation. The appendix is not visualized with certainty. No inflammatory changes identified in the right lower quadrant. Lymphatic: No adenopathy. Reproductive: The uterus is grossly unremarkable. No adnexal masses. Other: Small fat containing umbilical hernia. Musculoskeletal: Osteopenia with degenerative changes of the spine. No acute osseous pathology. Review of the MIP images confirms the above findings. IMPRESSION: 1. No aortic aneurysm or dissection. 2. Bibasilar subpleural airspace densities progressed since the prior CT and may represent progression of interstitial lung disease. Superimposed pneumonia is not excluded. 3. Cirrhosis. 4. No bowel obstruction. 5.  Aortic Atherosclerosis (ICD10-I70.0). Electronically Signed   By: Elgie Collard M.D.   On: 05/17/2022 18:49   DG Chest 2 View  Result Date: 05/17/2022 CLINICAL DATA:  chest pain EXAM: CHEST - 2 VIEW COMPARISON:  Chest x-ray 05/13/2022, CT chest 01/29/2020 FINDINGS: Persistent mild cardiomegaly. The heart and mediastinal contours are unchanged. Aortic calcification. No focal consolidation. Mild pulmonary edema. No pleural effusion. No pneumothorax. No acute osseous abnormality. IMPRESSION: Mild pulmonary edema. Electronically Signed   By: Tish Frederickson M.D.   On: 05/17/2022 15:30   DG Pelvis 1-2 Views  Result Date: 05/13/2022 CLINICAL DATA:  Bilateral hip pain.  History of sickle cell disease. EXAM: PELVIS - 1-2 VIEW COMPARISON:  CT 12/01/2019 FINDINGS: There is no evidence of pelvic fracture or diastasis. No pelvic bone lesions are seen. IMPRESSION: Negative. Electronically Signed   By: Burman Nieves M.D.   On: 05/13/2022 21:18   DG Chest Portable 1 View  Result  Date: 05/13/2022 CLINICAL DATA:  Shortness of breath EXAM: PORTABLE CHEST 1 VIEW COMPARISON:  03/24/21 CXR FINDINGS: No pleural effusion. No pneumothorax. Cardiomegaly, similar to prior. Mild pulmonary edema. No new focal airspace opacity. No displaced rib fractures. Visualized upper abdomen is unremarkable. IMPRESSION: Cardiomegaly with mild pulmonary edema. No new focal airspace opacity. Electronically Signed   By: Lorenza Cambridge M.D.   On: 05/13/2022 19:37     Assessment & Plan:   Chronic right shoulder pain - DG Shoulder Right  2. Vertigo  - meclizine (ANTIVERT) 12.5 MG tablet; Take 1 tablet (12.5 mg total) by mouth 3 (three) times daily as needed for dizziness.  Dispense: 30 tablet; Refill: 0  Follow up:  Follow up in 2 weeks with PCP     Ivonne Andrew, NP 05/30/2022

## 2022-05-30 NOTE — Assessment & Plan Note (Signed)
-   DG Shoulder Right  2. Vertigo  - meclizine (ANTIVERT) 12.5 MG tablet; Take 1 tablet (12.5 mg total) by mouth 3 (three) times daily as needed for dizziness.  Dispense: 30 tablet; Refill: 0  Follow up:  Follow up in 2 weeks with PCP

## 2022-06-12 ENCOUNTER — Ambulatory Visit (INDEPENDENT_AMBULATORY_CARE_PROVIDER_SITE_OTHER): Payer: Medicare Other | Admitting: Family Medicine

## 2022-06-12 ENCOUNTER — Encounter: Payer: Self-pay | Admitting: Family Medicine

## 2022-06-12 ENCOUNTER — Non-Acute Institutional Stay (HOSPITAL_COMMUNITY)
Admission: AD | Admit: 2022-06-12 | Discharge: 2022-06-12 | Disposition: A | Discharge status: Home / Self Care | Payer: Medicare Other | Source: Ambulatory Visit | Attending: Internal Medicine | Admitting: Internal Medicine

## 2022-06-12 VITALS — BP 133/67 | HR 67 | Temp 97.8°F | Wt 150.8 lb

## 2022-06-12 DIAGNOSIS — H43811 Vitreous degeneration, right eye: Secondary | ICD-10-CM | POA: Diagnosis not present

## 2022-06-12 DIAGNOSIS — G894 Chronic pain syndrome: Secondary | ICD-10-CM | POA: Diagnosis not present

## 2022-06-12 DIAGNOSIS — Z23 Encounter for immunization: Secondary | ICD-10-CM | POA: Diagnosis not present

## 2022-06-12 DIAGNOSIS — M069 Rheumatoid arthritis, unspecified: Secondary | ICD-10-CM | POA: Diagnosis not present

## 2022-06-12 DIAGNOSIS — D571 Sickle-cell disease without crisis: Secondary | ICD-10-CM | POA: Diagnosis not present

## 2022-06-12 DIAGNOSIS — E039 Hypothyroidism, unspecified: Secondary | ICD-10-CM | POA: Diagnosis not present

## 2022-06-12 DIAGNOSIS — D638 Anemia in other chronic diseases classified elsewhere: Secondary | ICD-10-CM | POA: Insufficient documentation

## 2022-06-12 DIAGNOSIS — E559 Vitamin D deficiency, unspecified: Secondary | ICD-10-CM | POA: Diagnosis not present

## 2022-06-12 DIAGNOSIS — D5700 Hb-SS disease with crisis, unspecified: Secondary | ICD-10-CM | POA: Diagnosis not present

## 2022-06-12 DIAGNOSIS — Z5941 Food insecurity: Secondary | ICD-10-CM | POA: Diagnosis not present

## 2022-06-12 LAB — RETICULOCYTES
Immature Retic Fract: 31 % — ABNORMAL HIGH (ref 2.3–15.9)
RBC.: 2.25 MIL/uL — ABNORMAL LOW (ref 3.87–5.11)
Retic Count, Absolute: 274.7 10*3/uL — ABNORMAL HIGH (ref 19.0–186.0)
Retic Ct Pct: 12.2 % — ABNORMAL HIGH (ref 0.4–3.1)

## 2022-06-12 LAB — CBC WITH DIFFERENTIAL/PLATELET
Abs Immature Granulocytes: 0.04 10*3/uL (ref 0.00–0.07)
Basophils Absolute: 0 10*3/uL (ref 0.0–0.1)
Basophils Relative: 1 %
Eosinophils Absolute: 0.4 10*3/uL (ref 0.0–0.5)
Eosinophils Relative: 6 %
HCT: 20.7 % — ABNORMAL LOW (ref 36.0–46.0)
Hemoglobin: 6.7 g/dL — CL (ref 12.0–15.0)
Immature Granulocytes: 1 %
Lymphocytes Relative: 32 %
Lymphs Abs: 2.2 10*3/uL (ref 0.7–4.0)
MCH: 29.3 pg (ref 26.0–34.0)
MCHC: 32.4 g/dL (ref 30.0–36.0)
MCV: 90.4 fL (ref 80.0–100.0)
Monocytes Absolute: 0.7 10*3/uL (ref 0.1–1.0)
Monocytes Relative: 10 %
Neutro Abs: 3.5 10*3/uL (ref 1.7–7.7)
Neutrophils Relative %: 50 %
Platelets: 196 10*3/uL (ref 150–400)
RBC: 2.29 MIL/uL — ABNORMAL LOW (ref 3.87–5.11)
RDW: 21.2 % — ABNORMAL HIGH (ref 11.5–15.5)
WBC: 6.9 10*3/uL (ref 4.0–10.5)
nRBC: 7.4 % — ABNORMAL HIGH (ref 0.0–0.2)

## 2022-06-12 LAB — COMPREHENSIVE METABOLIC PANEL
ALT: 19 U/L (ref 0–44)
AST: 84 U/L — ABNORMAL HIGH (ref 15–41)
Albumin: 4 g/dL (ref 3.5–5.0)
Alkaline Phosphatase: 84 U/L (ref 38–126)
Anion gap: 8 (ref 5–15)
BUN: 6 mg/dL — ABNORMAL LOW (ref 8–23)
CO2: 24 mmol/L (ref 22–32)
Calcium: 9.3 mg/dL (ref 8.9–10.3)
Chloride: 106 mmol/L (ref 98–111)
Creatinine, Ser: 0.6 mg/dL (ref 0.44–1.00)
GFR, Estimated: >60 mL/min (ref >60)
Glucose, Bld: 94 mg/dL (ref 70–99)
Potassium: 3.8 mmol/L (ref 3.5–5.1)
Sodium: 138 mmol/L (ref 135–145)
Total Bilirubin: 2.2 mg/dL — ABNORMAL HIGH (ref 0.3–1.2)
Total Protein: 8.5 g/dL — ABNORMAL HIGH (ref 6.5–8.1)

## 2022-06-12 LAB — TYPE AND SCREEN
ABO/RH(D): B POS
Antibody Screen: NEGATIVE

## 2022-06-12 LAB — TSH: TSH: 229.954 u[IU]/mL — ABNORMAL HIGH (ref 0.350–4.500)

## 2022-06-12 LAB — LACTATE DEHYDROGENASE: LDH: 1103 U/L — ABNORMAL HIGH (ref 98–192)

## 2022-06-12 MED ORDER — KETOROLAC TROMETHAMINE 30 MG/ML IJ SOLN
15.0000 mg | Freq: Once | INTRAMUSCULAR | Status: AC
  Administered 2022-06-12: 15 mg via INTRAVENOUS
  Filled 2022-06-12: qty 1

## 2022-06-12 MED ORDER — MORPHINE SULFATE (PF) 4 MG/ML IV SOLN: 4.0000 mg | Freq: Once | INTRAVENOUS | Status: DC

## 2022-06-12 MED ORDER — MORPHINE SULFATE (PF) 4 MG/ML IV SOLN
4.0000 mg | Freq: Once | INTRAVENOUS | Status: AC
  Administered 2022-06-12: 4 mg via INTRAVENOUS
  Filled 2022-06-12: qty 1

## 2022-06-12 MED ORDER — SODIUM CHLORIDE 0.45 % IV SOLN: INTRAVENOUS | Status: DC

## 2022-06-12 NOTE — Progress Notes (Signed)
Established Patient Office Visit  Subjective   Patient ID: [name], female    DOB: 31-Jan-1961  Age: 61 y.o. MRN: [id number]  Chief Complaint  Patient presents with   Follow-up    [name] is a 61 year old female with a medical history significant for sickle cell disease, chronic pain syndrome, opiate dependence and tolerance, rheumatoid arthritis, hypothyroidism, and anemia of chronic disease presents for a 19-month follow-up of her chronic conditions. Today, patient is complaining of worsening bilateral hip pain that is interfering with ambulation.  Patient rates her pain at 9/10, constant, and aching.  She has not attempted any over-the-counter interventions to alleviate this problem.  Patient has not had any recent falls or injuries.  She is followed closely by her rheumatologist and has a follow-up appointment scheduled for December. Patient denies any headache, chest pain, urinary symptoms, nausea, vomiting, or diarrhea.    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   Thrombophlebitis leg superficial 03/04/2013   Rheumatoid arthritis (HCC) 12/16/2012   Thyroid disease 11/27/2011   Past Medical History:  Diagnosis Date   Aneurysm (HCC)    Anxiety    Arthritis    Colon ulcer    Gall stones    Hypokalemia    Hypothyroidism    Pneumonia    Sickle cell anemia (HCC)    Ulcers of both lower  extremities (HCC)    Past Surgical History:  Procedure Laterality Date   ANKLE SURGERY  1989 and 1990   CEREBRAL ANEURYSM REPAIR     COLONOSCOPY  05/12/2012   Procedure: COLONOSCOPY;  Surgeon: Hart Carwin, MD;  Location: [facility];  Service: Endoscopy;  Laterality: N/A;   ESOPHAGOGASTRODUODENOSCOPY  05/12/2012   Procedure: ESOPHAGOGASTRODUODENOSCOPY (EGD);  Surgeon: Hart Carwin, MD;  Location: [facility];  Service: Endoscopy;  Laterality: N/A;   gall stone removal     GIVENS CAPSULE STUDY  05/13/2012   Procedure: GIVENS CAPSULE STUDY;  Surgeon: Hart Carwin, MD;  Location: [facility];  Service: Endoscopy;  Laterality: N/A;   Social History   Tobacco Use   Smoking status: Never   Smokeless tobacco: Never  Vaping Use   Vaping Use: Never used  Substance Use Topics   Alcohol use: Yes    Comment: Occasional   Drug use: No   Social History   Socioeconomic History   Marital status: Single    Spouse name: Not on file   Number of children: Not on file   Years of education: Not on file   Highest education level: Not on file  Occupational History   Not on file  Tobacco Use   Smoking status: Never   Smokeless tobacco: Never  Vaping Use   Vaping Use: Never used  Substance and Sexual Activity   Alcohol use: Yes    Comment: Occasional   Drug use: No  Sexual activity: Not Currently  Other Topics Concern   Not on file  Social History Narrative   Not on file   Social Determinants of Health   Financial Resource Strain: Not on file  Food Insecurity: Food Insecurity Present (05/17/2022)   Hunger Vital Sign    Worried About Running Out of Food in the Last Year: Sometimes true    Ran Out of Food in the Last Year: Sometimes true  Transportation Needs: Unmet Transportation Needs (05/17/2022)   PRAPARE - Administrator, Civil Service (Medical): Yes    Lack of Transportation (Non-Medical): Yes  Physical Activity: Not on file  Stress: Not on file  Social  Connections: Not on file  Intimate Partner Violence: Not At Risk (05/17/2022)   Humiliation, Afraid, Rape, and Kick questionnaire    Fear of Current or Ex-Partner: No    Emotionally Abused: No    Physically Abused: No    Sexually Abused: No   Family Status  Relation Name Status   Mother  Alive   Father  Deceased   Sister  Alive   Brother  Alive   Sister  Alive   Neg Hx  (Not Specified)   Family History  Problem Relation Age of Onset   Cancer Mother 45       Esophageal   Sickle cell trait Mother    Sickle cell trait Father    HIV/AIDS Brother    Colon cancer Neg Hx    Allergies  Allergen Reactions   Ampicillin Other (See Comments)    Caused red bumps on the skin, but nothing else Per patient, she tolerates cephalosporins without problems (??)   Demerol [Meperidine] Other (See Comments)    A high(er) dose caused seizure(s)      Review of Systems  Constitutional: Negative.   HENT: Negative.    Eyes: Negative.   Respiratory: Negative.    Cardiovascular: Negative.   Gastrointestinal: Negative.   Genitourinary: Negative.   Musculoskeletal:  Positive for back pain and joint pain.  Skin: Negative.   Neurological: Negative.   Psychiatric/Behavioral: Negative.        Objective:     Wt 150 lb 12.8 oz (68.4 kg)   BMI 21.03 kg/m  BP Readings from Last 3 Encounters:  05/30/22 131/66  05/21/22 (!) 150/73  05/14/22 123/71   Wt Readings from Last 3 Encounters:  06/12/22 150 lb 12.8 oz (68.4 kg)  05/30/22 148 lb (67.1 kg)  05/21/22 156 lb 4.9 oz (70.9 kg)      Physical Exam Constitutional:      Appearance: Normal appearance.  Eyes:     Pupils: Pupils are equal, round, and reactive to light.  Cardiovascular:     Rate and Rhythm: Normal rate and regular rhythm.     Pulses: Normal pulses.  Pulmonary:     Effort: Pulmonary effort is normal.  Abdominal:     General: Bowel sounds are normal.  Musculoskeletal:        General: Normal range of motion.      Cervical back: Normal range of motion.  Skin:    General: Skin is warm.  Neurological:     General: No focal deficit present.     Mental Status: She is alert. Mental status is at baseline.  Psychiatric:        Mood and Affect: Mood normal.        Behavior: Behavior normal.        Thought Content: Thought content normal.  Judgment: Judgment normal.      No results found for any visits on 06/12/22.  Last CBC Lab Results  Component Value Date   WBC 6.9 05/21/2022   HGB 7.7 (L) 05/21/2022   HCT 22.2 (L) 05/21/2022   MCV 84.4 05/21/2022   MCH 29.3 05/21/2022   RDW 21.0 (H) 05/21/2022   PLT 161 05/21/2022   Last metabolic panel Lab Results  Component Value Date   GLUCOSE 90 05/21/2022   NA 134 (L) 05/21/2022   K 4.0 05/21/2022   CL 107 05/21/2022   CO2 23 05/21/2022   BUN 9 05/21/2022   CREATININE 0.73 05/21/2022   GFRNONAA >60 05/21/2022   CALCIUM 8.6 (L) 05/21/2022   PHOS 3.6 05/18/2022   PROT 7.5 05/18/2022   ALBUMIN 3.4 (L) 05/18/2022   LABGLOB 3.4 03/27/2022   AGRATIO 1.2 03/27/2022   BILITOT 2.9 (H) 05/18/2022   ALKPHOS 53 05/18/2022   AST 62 (H) 05/18/2022   ALT 13 05/18/2022   ANIONGAP 4 (L) 05/21/2022   Last lipids Lab Results  Component Value Date   CHOL 133 05/11/2014   HDL 49 05/11/2014   LDLCALC 66 05/11/2014   TRIG 89 05/11/2014   CHOLHDL 2.7 05/11/2014   Last hemoglobin A1c No results found for: "HGBA1C" Last thyroid functions Lab Results  Component Value Date   TSH 36.200 (H) 03/27/2022   T4TOTAL 4.1 (L) 03/27/2022   Last vitamin D Lab Results  Component Value Date   VD25OH 26.6 (L) 03/27/2022   Last vitamin B12 and Folate Lab Results  Component Value Date   VITAMINB12 347 12/27/2020   FOLATE 15.1 12/27/2020      The ASCVD Risk score (Arnett DK, et al., 2019) failed to calculate for the following reasons:   Cannot find a previous HDL lab   Cannot find a previous total cholesterol lab    Assessment & Plan:    Problem List Items Addressed This Visit       Other   Sickle cell anemia (HCC) - Primary   Other Visit Diagnoses     Vitamin D deficiency          1. Sickle cell disease without crisis [facility]) Ms. Holben is complaining of bilateral lower extremity pain primarily hips.  She rates her pain as 9/10.  Patient will transition to day hospital for further work-up and evaluation of her sickle cell crisis. The patient will be treated with IV morphine Toradol 15 mg IV x1 IV fluids, 0.45% saline at 50 mL/h Tylenol 1000 mg x 1 Will reassess pain in the context of function throughout her admission. - Sickle Cell Panel  2. Vitamin D deficiency  - Sickle Cell Panel  3. Flu vaccine need  - Flu Vaccine QUAD 32mo+IM (Fluarix, Fluzone & Alfiuria Quad PF)   Return in about 3 months (around 09/12/2022) for diabetes.   Nolon Nations  APRN, MSN, FNP-C Patient Care [facility] [address] [place], [place] [address] [phone]

## 2022-06-12 NOTE — H&P (Signed)
[facility] History and Physical   Date: 06/12/2022  Patient name: [name] Medical record number: [id number] Date of birth: [birth year] Age: 61 y.o. Gender: female PCP: Massie Maroon, FNP  Attending physician: Quentin Angst, MD  Chief Complaint: Sickle cell pain  History of Present Illness: [name] is a 61 year old female with a medical history significant for sickle cell disease, chronic pain syndrome, rheumatoid arthritis, hypothyroidism, and anemia of chronic disease presented with complaints of bilateral hip pain that has been present over the past several days and unrelieved by Tylenol at home.  Patient is mostly opiate nave.  She last had Tylenol this a.m. without sustained relief.  She denies any recent falls or injuries.  No swelling or redness.  No chest pain, shortness of breath, fatigue, urinary symptoms, nausea, vomiting, or diarrhea.  No sick contacts, recent travel, or known exposure to COVID-19.  Patient has not identified any provocative factors concerning current crisis.  Pain intensity is 8/10 characterized as intermittent and aching.  Meds: Medications Prior to Admission  Medication Sig Dispense Refill Last Dose   ASCORBIC ACID PO Take 1 tablet by mouth every morning. (Patient not taking: Reported on 06/12/2022)      calcium citrate (CALCITRATE - DOSED IN MG ELEMENTAL CALCIUM) 950 (200 Ca) MG tablet Take 200 mg of elemental calcium by mouth daily.      CALCIUM PO Take 500 mg by mouth.      Cholecalciferol (VITAMIN D3 PO) Take 1 tablet by mouth daily.      cyanocobalamin (VITAMIN B12) 500 MCG tablet Take 500 mcg by mouth daily.      cyclobenzaprine (FLEXERIL) 10 MG tablet Take 1 tablet (10 mg total) by mouth at bedtime. Ran out (Patient taking differently: Take 10 mg by mouth at bedtime.) 30 tablet 2    DULoxetine (CYMBALTA) 30 MG capsule Take 1 capsule (30 mg total) by mouth daily. (Patient not taking: Reported on 05/17/2022) 90 capsule 1     folic acid (FOLVITE) 1 MG tablet Take 1 tablet (1 mg total) by mouth daily. 90 tablet 1    hydrOXYzine (ATARAX) 10 MG tablet Take 1 tablet (10 mg total) by mouth 3 (three) times daily as needed. 30 tablet 0    ibuprofen (ADVIL) 800 MG tablet Take 1 tablet (800 mg total) by mouth every 8 (eight) hours as needed. (Patient not taking: Reported on 06/12/2022) 30 tablet 2    levothyroxine (SYNTHROID) 175 MCG tablet Take 175 mcg by mouth daily before breakfast. (Patient not taking: Reported on 06/12/2022)      meclizine (ANTIVERT) 12.5 MG tablet Take 1 tablet (12.5 mg total) by mouth 3 (three) times daily as needed for dizziness. (Patient not taking: Reported on 06/12/2022) 30 tablet 0    traMADol (ULTRAM) 50 MG tablet Take 1 tablet (50 mg total) by mouth every 6 (six) hours as needed. 30 tablet 0     Allergies: Ampicillin and Demerol [meperidine] Past Medical History:  Diagnosis Date   Aneurysm (HCC)    Anxiety    Arthritis    Colon ulcer    Gall stones    Hypokalemia    Hypothyroidism    Pneumonia    Sickle cell anemia (HCC)    Ulcers of both lower extremities (HCC)    Past Surgical History:  Procedure Laterality Date   ANKLE SURGERY  1989 and 1990   CEREBRAL ANEURYSM REPAIR     COLONOSCOPY  05/12/2012   Procedure: COLONOSCOPY;  Surgeon: Hart Carwin, MD;  Location: [facility];  Service: Endoscopy;  Laterality: N/A;   ESOPHAGOGASTRODUODENOSCOPY  05/12/2012   Procedure: ESOPHAGOGASTRODUODENOSCOPY (EGD);  Surgeon: Hart Carwin, MD;  Location: [facility];  Service: Endoscopy;  Laterality: N/A;   gall stone removal     GIVENS CAPSULE STUDY  05/13/2012   Procedure: GIVENS CAPSULE STUDY;  Surgeon: Hart Carwin, MD;  Location: [facility];  Service: Endoscopy;  Laterality: N/A;   Family History  Problem Relation Age of Onset   Cancer Mother 66       Esophageal   Sickle cell trait Mother    Sickle cell trait Father    HIV/AIDS Brother    Colon cancer Neg Hx    Social  History   Socioeconomic History   Marital status: Single    Spouse name: Not on file   Number of children: Not on file   Years of education: Not on file   Highest education level: Not on file  Occupational History   Not on file  Tobacco Use   Smoking status: Never   Smokeless tobacco: Never  Vaping Use   Vaping Use: Never used  Substance and Sexual Activity   Alcohol use: Yes    Comment: Occasional   Drug use: No   Sexual activity: Not Currently  Other Topics Concern   Not on file  Social History Narrative   Not on file   Social Determinants of Health   Financial Resource Strain: Not on file  Food Insecurity: Food Insecurity Present (05/17/2022)   Hunger Vital Sign    Worried About Running Out of Food in the Last Year: Sometimes true    Ran Out of Food in the Last Year: Sometimes true  Transportation Needs: Unmet Transportation Needs (05/17/2022)   PRAPARE - Administrator, Civil Service (Medical): Yes    Lack of Transportation (Non-Medical): Yes  Physical Activity: Not on file  Stress: Not on file  Social Connections: Not on file  Intimate Partner Violence: Not At Risk (05/17/2022)   Humiliation, Afraid, Rape, and Kick questionnaire    Fear of Current or Ex-Partner: No    Emotionally Abused: No    Physically Abused: No    Sexually Abused: No   Review of Systems  Constitutional: Negative.   HENT: Negative.    Eyes: Negative.   Respiratory: Negative.    Cardiovascular: Negative.   Gastrointestinal: Negative.   Genitourinary: Negative.   Musculoskeletal:  Positive for back pain and joint pain.  Skin: Negative.   Neurological: Negative.   Psychiatric/Behavioral: Negative.      Physical Exam: Blood pressure (!) 147/81, pulse 61, temperature (!) 97.5 F (36.4 C), temperature source Temporal, resp. rate 16, SpO2 100 %. Physical Exam Constitutional:      Appearance: Normal appearance.  Eyes:     Pupils: Pupils are equal, round, and reactive to  light.  Cardiovascular:     Rate and Rhythm: Normal rate and regular rhythm.  Pulmonary:     Effort: Pulmonary effort is normal.  Abdominal:     General: Bowel sounds are normal.  Skin:    General: Skin is warm.  Neurological:     General: No focal deficit present.     Mental Status: She is alert. Mental status is at baseline.  Psychiatric:        Mood and Affect: Mood normal.        Behavior: Behavior normal.        Thought  Content: Thought content normal.        Judgment: Judgment normal.      Lab results: No results found for this or any previous visit (from the past 24 hour(s)).  Imaging results:  No results found.   Assessment & Plan: Patient admitted to sickle cell day infusion center for management of pain crisis.  Patient is opiate naive Initiate IV dilaudid  IV fluids, 0.45% saline at 100 mL/h Toradol 15 mg IV times one dose Tylenol 1000 mg by mouth times one dose Review CBC with differential, complete metabolic panel, and reticulocytes as results become available. Pain intensity will be reevaluated in context of functioning and relationship to baseline as care progresses If pain intensity remains elevated and/or sudden change in hemodynamic stability transition to inpatient services for higher level of care.     Nolon Nations  APRN, MSN, FNP-C Patient Care [facility] [address] [place], [place] [address] [phone]  06/12/2022, 10:15 AM

## 2022-06-12 NOTE — Progress Notes (Signed)
Patient: [name]  Critical Value: Hgb 6.7  Time and Date notified: 06/12/2022 at 11:20 AM  Provider notified: [place] Hollis, FNP  Action Taken: No new orders at this time

## 2022-06-12 NOTE — Progress Notes (Signed)
Patient admitted to the day infusion hospital for lab work and sickle cell pain management. Patient labs drawn (CBC w/diff, LDH, CMP, Retic, TSH and Type & Screen). Patient's Hemoglobin 6.7 which is within patient's baseline. Patient does not require a blood transfusion at this time. Initially, patient reported bilateral hip pain rated 9/10. For pain management, patient given 15 mg IV Toradol, 4 mg IV Morphine and patient hydrated with IV fluids. At discharge, patient rated pain at 2/10. Vital signs stable. Discharge instructions given. Patient alert, oriented and ambulatory at discharge.

## 2022-06-14 NOTE — Discharge Summary (Signed)
[facility] Discharge Summary   Patient ID: [name] MRN: [id number] DOB/AGE: 62-12-1960 62 y.o.  Admit date: 06/12/2022 Discharge date:06/12/2022  Primary Care Physician:  Massie Maroon, FNP  Admission Diagnoses:  Principal Problem:   Sickle cell pain crisis [facility])  Discharge Medications:  Allergies as of 06/12/2022       Reactions   Ampicillin Other (See Comments)   Caused red bumps on the skin, but nothing else Per patient, she tolerates cephalosporins without problems (??)   Demerol [meperidine] Other (See Comments)   A high(er) dose caused seizure(s)        Medication List     TAKE these medications    ASCORBIC ACID PO Take 1 tablet by mouth every morning.   calcium citrate 950 (200 Ca) MG tablet Commonly known as: CALCITRATE - dosed in mg elemental calcium Take 200 mg of elemental calcium by mouth daily.   CALCIUM PO Take 500 mg by mouth.   cyanocobalamin 500 MCG tablet Commonly known as: VITAMIN B12 Take 500 mcg by mouth daily.   cyclobenzaprine 10 MG tablet Commonly known as: FLEXERIL Take 1 tablet (10 mg total) by mouth at bedtime. Ran out What changed: additional instructions   DULoxetine 30 MG capsule Commonly known as: CYMBALTA Take 1 capsule (30 mg total) by mouth daily.   folic acid 1 MG tablet Commonly known as: FOLVITE Take 1 tablet (1 mg total) by mouth daily.   hydrOXYzine 10 MG tablet Commonly known as: ATARAX Take 1 tablet (10 mg total) by mouth 3 (three) times daily as needed.   ibuprofen 800 MG tablet Commonly known as: ADVIL Take 1 tablet (800 mg total) by mouth every 8 (eight) hours as needed.   levothyroxine 175 MCG tablet Commonly known as: SYNTHROID Take 175 mcg by mouth daily before breakfast.   meclizine 12.5 MG tablet Commonly known as: ANTIVERT Take 1 tablet (12.5 mg total) by mouth 3 (three) times daily as needed for dizziness.   traMADol 50 MG tablet Commonly known as: Ultram Take 1  tablet (50 mg total) by mouth every 6 (six) hours as needed.   VITAMIN D3 PO Take 1 tablet by mouth daily.         Consults:  None  Significant Diagnostic Studies:  DG Shoulder Right  Result Date: 06/02/2022 CLINICAL DATA:  Right shoulder pain for 2 weeks. Patient reports a fall and 2021. EXAM: RIGHT SHOULDER - 2+ VIEW COMPARISON:  None Available. FINDINGS: There is no evidence of fracture or dislocation. There is no evidence of arthropathy or other focal bone abnormality. Soft tissues are unremarkable. IMPRESSION: Negative. Electronically Signed   By: Amie Portland M.D.   On: 06/02/2022 15:15   ECHOCARDIOGRAM COMPLETE  Result Date: 05/18/2022    ECHOCARDIOGRAM REPORT   Patient Name:   [name] Date of Exam: 05/18/2022 Medical Rec #:  [id number]     Height:       71.0 in Accession #:    [id number]    Weight:       144.4 lb Date of Birth:  1960-09-21     BSA:          1.836 m Patient Age:    61 years      BP:           134/69 mmHg Patient Gender: F             HR:           45  bpm. Exam Location:  Inpatient Procedure: 2D Echo, Cardiac Doppler and Color Doppler Indications:    R07.9* Chest pain, unspecified  History:        Patient has prior history of Echocardiogram examinations, most                 recent 12/03/2012. Arrythmias:WPW; Signs/Symptoms:Chest Pain and                 Dyspnea. Sickle cell anemia.  Sonographer:    Sheralyn Boatman RDCS Referring Phys: [id number] [name]  Sonographer Comments: Technically difficult study due to poor echo windows. IMPRESSIONS  1. Left ventricular ejection fraction, by estimation, is 55 to 60%. The left ventricle has normal function. The left ventricle has no regional wall motion abnormalities. Left ventricular diastolic parameters were normal.  2. Right ventricular systolic function is normal. The right ventricular size is moderately enlarged. There is normal pulmonary artery systolic pressure. The estimated right ventricular systolic pressure is 26.2  mmHg.  3. Left atrial size was severely dilated.  4. Right atrial size was severely dilated.  5. The mitral valve is normal in structure. Moderate mitral valve regurgitation. No evidence of mitral stenosis.  6. The aortic valve is normal in structure. Aortic valve regurgitation is not visualized. No aortic stenosis is present.  7. The inferior vena cava is dilated in size with <50% respiratory variability, suggesting right atrial pressure of 15 mmHg. FINDINGS  Left Ventricle: Left ventricular ejection fraction, by estimation, is 55 to 60%. The left ventricle has normal function. The left ventricle has no regional wall motion abnormalities. The left ventricular internal cavity size was normal in size. There is  no left ventricular hypertrophy. Left ventricular diastolic parameters were normal. Right Ventricle: The right ventricular size is moderately enlarged. No increase in right ventricular wall thickness. Right ventricular systolic function is normal. There is normal pulmonary artery systolic pressure. The tricuspid regurgitant velocity is 1.67 m/s, and with an assumed right atrial pressure of 15 mmHg, the estimated right ventricular systolic pressure is 26.2 mmHg. Left Atrium: Left atrial size was severely dilated. Right Atrium: Right atrial size was severely dilated. Pericardium: There is no evidence of pericardial effusion. Mitral Valve: The mitral valve is normal in structure. Moderate mitral valve regurgitation. No evidence of mitral valve stenosis. Tricuspid Valve: The tricuspid valve is normal in structure. Tricuspid valve regurgitation is mild . No evidence of tricuspid stenosis. Aortic Valve: The aortic valve is normal in structure. Aortic valve regurgitation is not visualized. No aortic stenosis is present. Pulmonic Valve: The pulmonic valve was normal in structure. Pulmonic valve regurgitation is not visualized. No evidence of pulmonic stenosis. Aorta: The aortic root is normal in size and structure.  Venous: The inferior vena cava is dilated in size with less than 50% respiratory variability, suggesting right atrial pressure of 15 mmHg. IAS/Shunts: No atrial level shunt detected by color flow Doppler.  LEFT VENTRICLE PLAX 2D LVIDd:         5.30 cm      Diastology LVIDs:         3.30 cm      LV e' medial:    7.94 cm/s LV PW:         1.10 cm      LV E/e' medial:  10.5 LV IVS:        1.00 cm      LV e' lateral:   13.90 cm/s LVOT diam:     2.30 cm  LV E/e' lateral: 6.0 LV SV:         110 LV SV Index:   60 LVOT Area:     4.15 cm  LV Volumes (MOD) LV vol d, MOD A2C: 185.0 ml LV vol d, MOD A4C: 181.0 ml LV vol s, MOD A2C: 72.0 ml LV vol s, MOD A4C: 72.5 ml LV SV MOD A2C:     113.0 ml LV SV MOD A4C:     181.0 ml LV SV MOD BP:      111.8 ml RIGHT VENTRICLE             IVC RV S prime:     13.60 cm/s  IVC diam: 2.50 cm TAPSE (M-mode): 2.5 cm LEFT ATRIUM              Index        RIGHT ATRIUM           Index LA diam:        4.30 cm  2.34 cm/m   RA Area:     29.30 cm LA Vol (A2C):   131.0 ml 71.35 ml/m  RA Volume:   112.00 ml 61.00 ml/m LA Vol (A4C):   138.0 ml 75.16 ml/m LA Biplane Vol: 136.0 ml 74.08 ml/m  AORTIC VALVE LVOT Vmax:   116.00 cm/s LVOT Vmean:  69.700 cm/s LVOT VTI:    0.264 m  AORTA Ao Root diam: 3.10 cm Ao Asc diam:  3.40 cm MITRAL VALVE                  TRICUSPID VALVE MV Area (PHT): 2.83 cm       TR Peak grad:   11.2 mmHg MV Decel Time: 268 msec       TR Vmax:        167.00 cm/s MR Peak grad:    116.4 mmHg MR Mean grad:    84.5 mmHg    SHUNTS MR Vmax:         539.50 cm/s  Systemic VTI:  0.26 m MR Vmean:        447.5 cm/s   Systemic Diam: 2.30 cm MR PISA:         1.57 cm MR PISA Eff ROA: 11 mm MR PISA Radius:  0.50 cm MV E velocity: 83.20 cm/s MV A velocity: 54.80 cm/s MV E/A ratio:  1.52 Donato Schultz MD Electronically signed by Donato Schultz MD Signature Date/Time: 05/18/2022/3:07:46 PM    Final    CT Angio Chest/Abd/Pel for Dissection W and/or Wo Contrast  Result Date: 05/17/2022 CLINICAL  DATA:  Acute aortic syndrome suspected. EXAM: CT ANGIOGRAPHY CHEST, ABDOMEN AND PELVIS TECHNIQUE: Non-contrast CT of the chest was initially obtained. Multidetector CT imaging through the chest, abdomen and pelvis was performed using the standard protocol during bolus administration of intravenous contrast. Multiplanar reconstructed images and MIPs were obtained and reviewed to evaluate the vascular anatomy. RADIATION DOSE REDUCTION: This exam was performed according to the departmental dose-optimization program which includes automated exposure control, adjustment of the mA and/or kV according to patient size and/or use of iterative reconstruction technique. CONTRAST:  80mL OMNIPAQUE IOHEXOL 350 MG/ML SOLN COMPARISON:  Chest CT dated 01/29/2020. FINDINGS: CTA CHEST FINDINGS Cardiovascular: Borderline cardiomegaly. Trace pericardial effusion measuring approximately 5 mm in thickness anterior to the heart. Mild atherosclerotic calcification of the aortic arch. No aneurysmal dilatation or dissection. The origins of the great vessels of the aortic arch appear patent. No pulmonary artery embolus identified. Mediastinum/Nodes: No hilar  or mediastinal adenopathy. The esophagus is grossly unremarkable. No mediastinal fluid collection. Lungs/Pleura: Bibasilar subpleural airspace densities, right greater than left, have progressed since the prior CT and may represent progression of interstitial lung disease. Superimposed pneumonia is not excluded. Clinical correlation is recommended. Similar appearance of irregular nodule in the right apex, likely post inflammatory scarring. There is no pleural effusion or pneumothorax. The central airways are patent. Musculoskeletal: Osteopenia.  No acute osseous pathology. Review of the MIP images confirms the above findings. CTA ABDOMEN AND PELVIS FINDINGS VASCULAR Aorta: Normal caliber aorta without aneurysm, dissection, vasculitis or significant stenosis. Celiac: High-grade narrowing of  the proximal celiac axis may be related to compression by diaphragmatic crus. The celiac axis and its major branches remain patent. SMA: Patent without evidence of aneurysm, dissection, vasculitis or significant stenosis. Renals: Both renal arteries are patent without evidence of aneurysm, dissection, vasculitis, fibromuscular dysplasia or significant stenosis. IMA: Patent without evidence of aneurysm, dissection, vasculitis or significant stenosis. Inflow: Patent without evidence of aneurysm, dissection, vasculitis or significant stenosis. Veins: No obvious venous abnormality within the limitations of this arterial phase study. Review of the MIP images confirms the above findings. NON-VASCULAR No intra-abdominal free air or free fluid. Hepatobiliary: Cirrhosis. Mild dilatation of the biliary tree, likely post cholecystectomy. No retained calcified stone noted in the central CBD. Pancreas: Unremarkable. No pancreatic ductal dilatation or surrounding inflammatory changes. Spleen: The spleen is not visualized and may be surgically absent or related to other splenectomy. Adrenals/Urinary Tract: The adrenal glands, kidneys, visualized ureters appear unremarkable. The urinary bladder is distended. Stomach/Bowel: There is moderate stool throughout the colon. There is no bowel obstruction or active inflammation. The appendix is not visualized with certainty. No inflammatory changes identified in the right lower quadrant. Lymphatic: No adenopathy. Reproductive: The uterus is grossly unremarkable. No adnexal masses. Other: Small fat containing umbilical hernia. Musculoskeletal: Osteopenia with degenerative changes of the spine. No acute osseous pathology. Review of the MIP images confirms the above findings. IMPRESSION: 1. No aortic aneurysm or dissection. 2. Bibasilar subpleural airspace densities progressed since the prior CT and may represent progression of interstitial lung disease. Superimposed pneumonia is not  excluded. 3. Cirrhosis. 4. No bowel obstruction. 5.  Aortic Atherosclerosis (ICD10-I70.0). Electronically Signed   By: Elgie Collard M.D.   On: 05/17/2022 18:49   DG Chest 2 View  Result Date: 05/17/2022 CLINICAL DATA:  chest pain EXAM: CHEST - 2 VIEW COMPARISON:  Chest x-ray 05/13/2022, CT chest 01/29/2020 FINDINGS: Persistent mild cardiomegaly. The heart and mediastinal contours are unchanged. Aortic calcification. No focal consolidation. Mild pulmonary edema. No pleural effusion. No pneumothorax. No acute osseous abnormality. IMPRESSION: Mild pulmonary edema. Electronically Signed   By: Tish Frederickson M.D.   On: 05/17/2022 15:30   History of Present Illness: [name] is a 61 year old female with a medical history significant for sickle cell disease, chronic pain syndrome, rheumatoid arthritis, hypothyroidism, and anemia of chronic disease presented with complaints of bilateral hip pain that has been present over the past several days and unrelieved by Tylenol at home.  Patient is mostly opiate nave.  She last had Tylenol this a.m. without sustained relief.  She denies any recent falls or injuries.  No swelling or redness.  No chest pain, shortness of breath, fatigue, urinary symptoms, nausea, vomiting, or diarrhea.  No sick contacts, recent travel, or known exposure to COVID-19.  Patient has not identified any provocative factors concerning current crisis.  Pain intensity is 8/10 characterized as intermittent and aching.  [facility] Course: Patient transition to sickle cell day infusion clinic for primary care for management of pain crisis. Patient is mostly opiate nave, primarily takes Tylenol at home. Reviewed all laboratory values, hemoglobin 6.7 g/dL, consistent with patient's baseline of 6-7 g/dL.  [name] is typically not transfused unless hemoglobin is less than 7.0 g/dL. Her pain was managed with IV Dilaudid, IV Toradol, and IV fluids.  Patient's pain intensity  decreased and she is requesting discharge home.  Patient is alert, oriented, and ambulating without any assistance.  She will discharge home in a hemodynamically stable condition. Physical Exam at Discharge:  BP 134/74 (BP Location: Left Arm)   Pulse 60   Temp 97.7 F (36.5 C) (Temporal)   Resp 16   SpO2 95%   Physical Exam Constitutional:      Appearance: Normal appearance.  Eyes:     Pupils: Pupils are equal, round, and reactive to light.  Cardiovascular:     Rate and Rhythm: Normal rate and regular rhythm.     Pulses: Normal pulses.  Pulmonary:     Effort: Pulmonary effort is normal.  Abdominal:     General: Bowel sounds are normal.  Skin:    General: Skin is warm.  Neurological:     General: No focal deficit present.     Mental Status: She is alert. Mental status is at baseline.  Psychiatric:        Mood and Affect: Mood normal.        Behavior: Behavior normal.        Thought Content: Thought content normal.        Judgment: Judgment normal.     Disposition at Discharge: Discharge disposition: 01-Home or Self Care       Discharge Orders: Discharge Instructions     Discharge patient   Complete by: As directed    Discharge disposition: 01-Home or Self Care   Discharge patient date: 06/12/2022       Condition at Discharge:   Stable  Time spent on Discharge:  Greater than 30 minutes.  Signed: Nolon Nations  APRN, MSN, FNP-C Patient Care [facility] [address] [place], [place] [address] [phone]  06/14/2022, 10:30 PM

## 2022-06-26 ENCOUNTER — Ambulatory Visit (INDEPENDENT_AMBULATORY_CARE_PROVIDER_SITE_OTHER): Payer: Medicare Other | Admitting: Family Medicine

## 2022-06-26 ENCOUNTER — Encounter: Payer: Self-pay | Admitting: Family Medicine

## 2022-06-26 VITALS — BP 118/63 | HR 64 | Temp 98.6°F | Ht 71.0 in | Wt 149.8 lb

## 2022-06-26 DIAGNOSIS — E079 Disorder of thyroid, unspecified: Secondary | ICD-10-CM | POA: Diagnosis not present

## 2022-06-26 DIAGNOSIS — D571 Sickle-cell disease without crisis: Secondary | ICD-10-CM

## 2022-06-26 DIAGNOSIS — M62830 Muscle spasm of back: Secondary | ICD-10-CM | POA: Diagnosis not present

## 2022-06-26 DIAGNOSIS — Z1211 Encounter for screening for malignant neoplasm of colon: Secondary | ICD-10-CM

## 2022-06-26 DIAGNOSIS — E7800 Pure hypercholesterolemia, unspecified: Secondary | ICD-10-CM | POA: Diagnosis not present

## 2022-06-26 MED ORDER — OMEGA-3-ACID ETHYL ESTERS 1 G PO CAPS: 1.0000 g | ORAL_CAPSULE | Freq: Two times a day (BID) | ORAL | 5 refills | Status: DC

## 2022-06-26 MED ORDER — CYCLOBENZAPRINE HCL 10 MG PO TABS: 10.0000 mg | ORAL_TABLET | Freq: Every day | ORAL | 2 refills | Status: DC

## 2022-06-26 MED ORDER — LEVOTHYROXINE SODIUM 175 MCG PO TABS: 175.0000 ug | ORAL_TABLET | Freq: Every day | ORAL | 5 refills | Status: DC

## 2022-06-26 NOTE — Progress Notes (Signed)
Established Patient Office Visit  Subjective   Patient ID: [name], female    DOB: [birth year]  Age: 61 y.o. MRN: [id number]  Chief Complaint  Patient presents with   Follow-up    [name] is a 61 year old female with a medical history significant for sickle cell disease, anemia of chronic disease, rheumatoid arthritis, and hypothyroidism presents for a follow-up of chronic conditions.  Patient is without complaint on today.  Patient was treated and evaluated in the day hospital on 06/11/2022 for sickle cell pain crisis.  At that time, patient's hemoglobin was 6.7 g/dL.  Patient's baseline is around 6.5-7.5 g/dL.  She is typically not transfused unless hemoglobin is less than 6.0.  Patient's most recent transfusion was during hospitalization on 05/13/2022.  [name] is requesting prescription refills.  She states that she has been out of levothyroxine over the past several weeks.  She has been unable to obtain her refills. Currently, patient denies headache, chest pain, urinary symptoms, nausea, vomiting, or diarrhea.  She is having minimal pain at this time.    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   Thrombophlebitis leg superficial 03/04/2013   Rheumatoid arthritis (HCC) 12/16/2012   Thyroid disease 11/27/2011   Past Medical History:  Diagnosis Date   Aneurysm (HCC)    Anxiety    Arthritis    Colon  ulcer    Gall stones    Hypokalemia    Hypothyroidism    Pneumonia    Sickle cell anemia (HCC)    Ulcers of both lower extremities (HCC)    Past Surgical History:  Procedure Laterality Date   ANKLE SURGERY  1989 and 1990   CEREBRAL ANEURYSM REPAIR     COLONOSCOPY  05/12/2012   Procedure: COLONOSCOPY;  Surgeon: Hart Carwin, MD;  Location: [facility];  Service: Endoscopy;  Laterality: N/A;   ESOPHAGOGASTRODUODENOSCOPY  05/12/2012   Procedure: ESOPHAGOGASTRODUODENOSCOPY (EGD);  Surgeon: Hart Carwin, MD;  Location: [facility];  Service: Endoscopy;  Laterality: N/A;   gall stone removal     GIVENS CAPSULE STUDY  05/13/2012   Procedure: GIVENS CAPSULE STUDY;  Surgeon: Hart Carwin, MD;  Location: [facility];  Service: Endoscopy;  Laterality: N/A;   Social History   Tobacco Use   Smoking status: Never   Smokeless tobacco: Never  Vaping Use   Vaping Use: Never used  Substance Use Topics   Alcohol use: Yes    Comment: Occasional   Drug use: No   Social History   Socioeconomic History   Marital status: Single    Spouse name: Not on file   Number of children: Not on file   Years of education: Not on file   Highest education level: Not on file  Occupational History   Not on file  Tobacco Use   Smoking status: Never  Smokeless tobacco: Never  Vaping Use   Vaping Use: Never used  Substance and Sexual Activity   Alcohol use: Yes    Comment: Occasional   Drug use: No   Sexual activity: Not Currently  Other Topics Concern   Not on file  Social History Narrative   Not on file   Social Determinants of Health   Financial Resource Strain: Not on file  Food Insecurity: Food Insecurity Present (05/17/2022)   Hunger Vital Sign    Worried About Running Out of Food in the Last Year: Sometimes true    Ran Out of Food in the Last Year: Sometimes true  Transportation Needs: Unmet Transportation Needs (05/17/2022)   PRAPARE - Administrator, Civil Service  (Medical): Yes    Lack of Transportation (Non-Medical): Yes  Physical Activity: Not on file  Stress: Not on file  Social Connections: Not on file  Intimate Partner Violence: Not At Risk (05/17/2022)   Humiliation, Afraid, Rape, and Kick questionnaire    Fear of Current or Ex-Partner: No    Emotionally Abused: No    Physically Abused: No    Sexually Abused: No   Family Status  Relation Name Status   Mother  Alive   Father  Deceased   Sister  Alive   Brother  Alive   Sister  Alive   Neg Hx  (Not Specified)   Family History  Problem Relation Age of Onset   Cancer Mother 51       Esophageal   Sickle cell trait Mother    Sickle cell trait Father    HIV/AIDS Brother    Colon cancer Neg Hx    Allergies  Allergen Reactions   Ampicillin Other (See Comments)    Caused red bumps on the skin, but nothing else Per patient, she tolerates cephalosporins without problems (??)   Demerol [Meperidine] Other (See Comments)    A high(er) dose caused seizure(s)      Review of Systems  Constitutional: Negative.  Negative for fever and malaise/fatigue.  HENT: Negative.    Eyes: Negative.   Respiratory: Negative.    Cardiovascular: Negative.   Gastrointestinal: Negative.   Genitourinary: Negative.   Musculoskeletal:  Positive for joint pain.  Neurological: Negative.   Endo/Heme/Allergies: Negative.   Psychiatric/Behavioral: Negative.        Objective:     BP 118/63   Pulse 64   Temp 98.6 F (37 C)   Ht 5\' 11"  (1.803 m)   Wt 149 lb 12.8 oz (67.9 kg)   SpO2 98%   BMI 20.89 kg/m  BP Readings from Last 3 Encounters:  06/26/22 118/63  06/12/22 134/74  06/12/22 133/67   Wt Readings from Last 3 Encounters:  06/26/22 149 lb 12.8 oz (67.9 kg)  06/12/22 150 lb 12.8 oz (68.4 kg)  05/30/22 148 lb (67.1 kg)      Physical Exam Eyes:     Pupils: Pupils are equal, round, and reactive to light.  Cardiovascular:     Rate and Rhythm: Normal rate and regular rhythm.     Pulses:  Normal pulses.  Pulmonary:     Effort: Pulmonary effort is normal.  Abdominal:     General: Bowel sounds are normal.  Skin:    General: Skin is warm.  Neurological:     General: No focal deficit present.  Psychiatric:        Mood and Affect: Mood normal.        Behavior: Behavior normal.  Thought Content: Thought content normal.        Judgment: Judgment normal.      No results found for any visits on 06/26/22.  Last CBC Lab Results  Component Value Date   WBC 6.9 06/12/2022   HGB 6.7 (LL) 06/12/2022   HCT 20.7 (L) 06/12/2022   MCV 90.4 06/12/2022   MCH 29.3 06/12/2022   RDW 21.2 (H) 06/12/2022   PLT 196 06/12/2022   Last metabolic panel Lab Results  Component Value Date   GLUCOSE 94 06/12/2022   NA 138 06/12/2022   K 3.8 06/12/2022   CL 106 06/12/2022   CO2 24 06/12/2022   BUN 6 (L) 06/12/2022   CREATININE 0.60 06/12/2022   GFRNONAA >60 06/12/2022   CALCIUM 9.3 06/12/2022   PHOS 3.6 05/18/2022   PROT 8.5 (H) 06/12/2022   ALBUMIN 4.0 06/12/2022   LABGLOB 3.4 03/27/2022   AGRATIO 1.2 03/27/2022   BILITOT 2.2 (H) 06/12/2022   ALKPHOS 84 06/12/2022   AST 84 (H) 06/12/2022   ALT 19 06/12/2022   ANIONGAP 8 06/12/2022   Last lipids Lab Results  Component Value Date   CHOL 133 05/11/2014   HDL 49 05/11/2014   LDLCALC 66 05/11/2014   TRIG 89 05/11/2014   CHOLHDL 2.7 05/11/2014   Last hemoglobin A1c No results found for: "HGBA1C" Last thyroid functions Lab Results  Component Value Date   TSH 229.954 (H) 06/12/2022   T4TOTAL 4.1 (L) 03/27/2022   Last vitamin D Lab Results  Component Value Date   VD25OH 26.6 (L) 03/27/2022   Last vitamin B12 and Folate Lab Results  Component Value Date   VITAMINB12 347 12/27/2020   FOLATE 15.1 12/27/2020      The ASCVD Risk score (Arnett DK, et al., 2019) failed to calculate for the following reasons:   Cannot find a previous HDL lab   Cannot find a previous total cholesterol lab    Assessment &  Plan:   Problem List Items Addressed This Visit       Other   Sickle cell anemia (HCC)   Relevant Orders   CBC   Other Visit Diagnoses     Colon cancer screening    -  Primary   Relevant Orders   Cologuard   Back muscle spasm       Relevant Medications   cyclobenzaprine (FLEXERIL) 10 MG tablet   High cholesterol       Relevant Medications   omega-3 acid ethyl esters (LOVAZA) 1 g capsule   Other Relevant Orders   Lipid Panel     1. Colon cancer screening  - Cologuard  2. Back muscle spasm  - cyclobenzaprine (FLEXERIL) 10 MG tablet; Take 1 tablet (10 mg total) by mouth at bedtime. Ran out  Dispense: 30 tablet; Refill: 2  3. High cholesterol  - omega-3 acid ethyl esters (LOVAZA) 1 g capsule; Take 1 capsule (1 g total) by mouth 2 (two) times daily.  Dispense: 60 capsule; Refill: 5 - Lipid Panel  4. Sickle cell disease without crisis (HCC) Will follow-up by phone with lab results.  If hemoglobin is less than 6 g/dL, will contact patient to return to day hospital for blood transfusion. - CBC  5. Thyroid disease Patient advised to restart levothyroxine and we will repeat her thyroid panel in 1 month. - levothyroxine (SYNTHROID) 175 MCG tablet; Take 1 tablet (175 mcg total) by mouth daily before breakfast.  Dispense: 30 tablet; Refill: 5 - Thyroid Panel With TSH; Future  Return in about 3 months (around 09/25/2022) for sickle cell anemia.   Nolon Nations  APRN, MSN, FNP-C Patient Care [facility] [address] [place], [place] [address] [phone]

## 2022-06-27 LAB — LIPID PANEL
Chol/HDL Ratio: 9.4 ratio — ABNORMAL HIGH (ref 0.0–4.4)
Cholesterol, Total: 255 mg/dL — ABNORMAL HIGH (ref 100–199)
HDL: 27 mg/dL — ABNORMAL LOW (ref >39)
LDL Chol Calc (NIH): 171 mg/dL — ABNORMAL HIGH (ref 0–99)
Triglycerides: 298 mg/dL — ABNORMAL HIGH (ref 0–149)
VLDL Cholesterol Cal: 57 mg/dL — ABNORMAL HIGH (ref 5–40)

## 2022-06-27 LAB — CBC
Hematocrit: 19.2 % — ABNORMAL LOW (ref 34.0–46.6)
Hemoglobin: 6.7 g/dL — CL (ref 11.1–15.9)
MCH: 30.9 pg (ref 26.6–33.0)
MCHC: 34.9 g/dL (ref 31.5–35.7)
MCV: 89 fL (ref 79–97)
NRBC: 14 % — ABNORMAL HIGH (ref 0–0)
Platelets: 156 10*3/uL (ref 150–450)
RBC: 2.17 x10E6/uL — CL (ref 3.77–5.28)
RDW: 18.8 % — ABNORMAL HIGH (ref 11.7–15.4)
WBC: 6.2 10*3/uL (ref 3.4–10.8)

## 2022-06-27 NOTE — Progress Notes (Signed)
[name] is a 61 year old female with a medical history significant for sickle cell disease, rheumatoid arthritis, hypothyroidism, and anemia of chronic disease was evaluated in clinic on 06/26/2022.  After reviewing patient's laboratory values, it was found that hemoglobin is 6.7 g/dL.  This is consistent with patient's baseline.  She is typically not transfused unless hemoglobin is less than 6.  We will continue to follow her hemoglobin level closely.  Also, patient has a history of hypothyroidism.  She had been without medication for greater than 1 week.  The recommendation was for patient to restart levothyroxine and return to clinic in 1 month to repeat thyroid panel.  Please ensure patient that sickle cell clinic is available for symptoms of sickle cell pain crisis.  Remind her of available hours and patient intake times.  Patient to continue folic acid and hydration with 32-64 ounces of water per day.    Nolon Nations  APRN, MSN, FNP-C Patient Care [facility] [address] [place], [place] [address] [phone]

## 2022-07-24 ENCOUNTER — Other Ambulatory Visit: Payer: Self-pay

## 2022-08-13 DIAGNOSIS — M0579 Rheumatoid arthritis with rheumatoid factor of multiple sites without organ or systems involvement: Secondary | ICD-10-CM | POA: Diagnosis not present

## 2022-08-14 ENCOUNTER — Telehealth: Payer: Self-pay | Admitting: Family Medicine

## 2022-08-14 NOTE — Telephone Encounter (Signed)
Left message for patient to call back and schedule Medicare Annual Wellness Visit (AWV) in office.  ° °If not able to come in office, please offer to do virtually or by telephone.  Left office number and my jabber #[phone]. ° °AWVI eligible as of 07/23/2009 ° °Please schedule at anytime with Nurse Health Advisor. °  °

## 2022-09-11 ENCOUNTER — Ambulatory Visit: Payer: Self-pay | Admitting: Family Medicine

## 2022-09-13 ENCOUNTER — Ambulatory Visit (INDEPENDENT_AMBULATORY_CARE_PROVIDER_SITE_OTHER): Payer: 59

## 2022-09-13 VITALS — BP 126/64 | HR 72 | Temp 98.8°F | Ht 71.0 in | Wt 144.2 lb

## 2022-09-13 DIAGNOSIS — Z0000 Encounter for general adult medical examination without abnormal findings: Secondary | ICD-10-CM

## 2022-09-13 NOTE — Progress Notes (Signed)
Subjective:   [name] is a 62 y.o. female who presents for Medicare Annual (Subsequent) preventive examination.  Review of Systems      Cardiac Risk Factors include: advanced age (>85mn, >>18women);Other (see comment), Risk factor comments: Dx Sickle Cell     Objective:    Today's Vitals   09/13/22 1338  BP: 126/64  Pulse: 72  Temp: 98.8 F (37.1 C)  TempSrc: Oral  SpO2: 98%  Weight: 144 lb 3.2 oz (65.4 kg)   Body mass index is 20.11 kg/m.     09/13/2022    1:54 PM 06/12/2022   11:15 AM 05/17/2022    7:41 PM 05/17/2022    2:42 PM 05/13/2022    5:28 PM 03/17/2022    8:28 AM 03/17/2022    1:21 AM  Advanced Directives  Does Patient Have a Medical Advance Directive? No No No No No No No  Would patient like information on creating a medical advance directive? No - Patient declined No - Patient declined No - Patient declined  No - Patient declined No - Patient declined No - Patient declined    Current Medications (verified) Outpatient Encounter Medications as of 09/13/2022  Medication Sig   acetaminophen (TYLENOL) 650 MG CR tablet Take 650 mg by mouth every 8 (eight) hours as needed for pain.   ASCORBIC ACID PO Take 1 tablet by mouth every morning. (Patient not taking: Reported on 06/12/2022)   calcium citrate (CALCITRATE - DOSED IN MG ELEMENTAL CALCIUM) 950 (200 Ca) MG tablet Take 200 mg of elemental calcium by mouth daily.   CALCIUM PO Take 500 mg by mouth.   Cholecalciferol (VITAMIN D3 PO) Take 1 tablet by mouth daily.   cyanocobalamin (VITAMIN B12) 500 MCG tablet Take 500 mcg by mouth daily.   cyclobenzaprine (FLEXERIL) 10 MG tablet Take 1 tablet (10 mg total) by mouth at bedtime. Ran out   DULoxetine (CYMBALTA) 30 MG capsule Take 1 capsule (30 mg total) by mouth daily. (Patient not taking: Reported on [id number]   Folic Acid (FOLATE PO) Take 800 mcg by mouth.   folic acid (FOLVITE) 1 MG tablet Take 1 tablet (1 mg total) by mouth daily. (Patient not taking:  Reported on 06/26/2022)   hydrOXYzine (ATARAX) 10 MG tablet Take 1 tablet (10 mg total) by mouth 3 (three) times daily as needed.   ibuprofen (ADVIL) 800 MG tablet Take 1 tablet (800 mg total) by mouth every 8 (eight) hours as needed. (Patient not taking: Reported on 06/12/2022)   levothyroxine (SYNTHROID) 175 MCG tablet Take 1 tablet (175 mcg total) by mouth daily before breakfast.   meclizine (ANTIVERT) 12.5 MG tablet Take 1 tablet (12.5 mg total) by mouth 3 (three) times daily as needed for dizziness. (Patient not taking: Reported on 06/26/2022)   omega-3 acid ethyl esters (LOVAZA) 1 g capsule Take 1 capsule (1 g total) by mouth 2 (two) times daily.   traMADol (ULTRAM) 50 MG tablet Take 1 tablet (50 mg total) by mouth every 6 (six) hours as needed. (Patient not taking: Reported on 06/26/2022)   No facility-administered encounter medications on file as of 09/13/2022.    Allergies (verified) Ampicillin and Demerol [meperidine]   History: Past Medical History:  Diagnosis Date   [place]    Anxiety    Arthritis    Colon ulcer    Gall stones    Hypokalemia    Hypothyroidism    Pneumonia    [place]  Ulcers of both lower extremities [facility])    Past Surgical History:  Procedure Laterality Date   ANKLE SURGERY  1989 and 1990   CEREBRAL ANEURYSM REPAIR     COLONOSCOPY  05/12/2012   Procedure: COLONOSCOPY;  Surgeon: Lafayette Dragon, MD;  Location: [facility];  Service: Endoscopy;  Laterality: N/A;   ESOPHAGOGASTRODUODENOSCOPY  05/12/2012   Procedure: ESOPHAGOGASTRODUODENOSCOPY (EGD);  Surgeon: Lafayette Dragon, MD;  Location: [facility];  Service: Endoscopy;  Laterality: N/A;   gall stone removal     GIVENS CAPSULE STUDY  05/13/2012   Procedure: GIVENS CAPSULE STUDY;  Surgeon: Lafayette Dragon, MD;  Location: [facility];  Service: Endoscopy;  Laterality: N/A;   Family History  Problem Relation Age of Onset   Cancer Mother 67       Esophageal   Sickle cell trait  Mother    Sickle cell trait Father    HIV/AIDS Brother    Colon cancer Neg Hx    Social History   Socioeconomic History   Marital status: Single    Spouse name: Not on file   Number of children: Not on file   Years of education: Not on file   Highest education level: Not on file  Occupational History   Not on file  Tobacco Use   Smoking status: Never   Smokeless tobacco: Never  Vaping Use   Vaping Use: Never used  Substance and Sexual Activity   Alcohol use: Yes    Comment: Occasional   Drug use: No   Sexual activity: Not Currently  Other Topics Concern   Not on file  Social History Narrative   Not on file   Social Determinants of Health   Financial Resource Strain: Low Risk  (09/13/2022)   Overall Financial Resource Strain (CARDIA)    Difficulty of Paying Living Expenses: Not hard at all  Food Insecurity: Food Insecurity Present (05/17/2022)   Hunger Vital Sign    Worried About [place] in the Last Year: Sometimes true    Ran Out of Food in the Last Year: Sometimes true  Transportation Needs: No Transportation Needs (09/13/2022)   PRAPARE - Hydrologist (Medical): No    Lack of Transportation (Non-Medical): No  Physical Activity: Inactive (09/13/2022)   Exercise Vital Sign    Days of Exercise per Week: 0 days    Minutes of Exercise per Session: 0 min  Stress: No Stress Concern Present (09/13/2022)   [place]    Feeling of Stress : Not at all  Social Connections: Socially Isolated (09/13/2022)   Social Connection and Isolation Panel [NHANES]    Frequency of Communication with Friends and Family: More than three times a week    Frequency of Social Gatherings with Friends and Family: More than three times a week    Attends Religious Services: Never    Marine scientist or Organizations: No    Attends Music therapist: Never    Marital Status:  Never married    Tobacco Counseling Counseling given: Not Answered   Clinical Intake:  Pre-visit preparation completed: No  Pain : No/denies pain     BMI - recorded: 20.11 Nutritional Status: BMI of 19-24  Normal Nutritional Risks: None Diabetes: No  How often do you need to have someone help you when you read instructions, pamphlets, or other written materials from your doctor or pharmacy?: 1 - Never  Diabetic?  No   Interpreter Needed?: No  Information entered by :: everly Catalyna Reilly LPN   Activities of Daily Living    09/13/2022    1:51 PM 06/12/2022   11:15 AM  In your present state of health, do you have any difficulty performing the following activities:  Hearing? 0 0  Vision? 0 0  Difficulty concentrating or making decisions? 0 0  Walking or climbing stairs? 0 0  Dressing or bathing? 0 0  Doing errands, shopping? 0   Preparing Food and eating ? N   Using the Toilet? N   In the past six months, have you accidently leaked urine? N   Do you have problems with loss of bowel control? N   Managing your Medications? N   Managing your Finances? N   Housekeeping or managing your Housekeeping? N     Patient Care Team: Dorena Dew, FNP as PCP - General (Family Medicine)  Indicate any recent Medical Services you may have received from other than [facility] providers in the past year (date may be approximate).     Assessment:   This is a routine wellness examination for [name].  Hearing/Vision screen Hearing Screening - Comments:: Denies hearing difficulties   Vision Screening - Comments:: Wears rx glasses - up to date with routine eye exams with  Dr Twanna Hy  Dietary issues and exercise activities discussed: Exercise limited by: None identified   Goals Addressed               This Visit's Progress     Patient stated (pt-stated)        I would like to get into Real Estate.       Depression Screen    09/13/2022    1:50 PM 09/13/2022    1:49 PM  06/26/2022   10:32 AM 06/12/2022    9:38 AM 01/15/2022    8:19 AM 12/26/2021   11:19 AM 08/15/2021    9:08 AM  PHQ 2/9 Scores  PHQ - 2 Score 0 0 '1 6 4 6 '$ 0  PHQ- 9 Score   '6 14 9 18     '$ Fall Risk    09/13/2022    1:52 PM 03/27/2022   10:32 AM 01/15/2022    8:19 AM 12/26/2021   11:19 AM 08/15/2021    9:08 AM  Fall Risk   Falls in the past year? 1 0 0 0 1  Number falls in past yr: 0 0 0 0 0  Injury with Fall? 0 0 0 0 0  Risk for fall due to : No Fall Risks No Fall Risks No Fall Risks No Fall Risks   Follow up Falls prevention discussed Falls evaluation completed Falls evaluation completed Falls evaluation completed     [place]:  Any stairs in or around the home? No  If so, are there any without handrails? No  Home free of loose throw rugs in walkways, pet beds, electrical cords, etc? Yes  Adequate lighting in your home to reduce risk of falls? Yes   ASSISTIVE DEVICES UTILIZED TO PREVENT FALLS:  Life alert? No  Use of a cane, walker or w/c? No  Grab bars in the bathroom? No  Shower chair or bench in shower? No  Elevated toilet seat or a handicapped toilet? No   TIMED UP AND GO:  Was the test performed? Yes .  Length of time to ambulate 10 feet: 10 sec.   Gait steady and fast without  use of assistive device  Cognitive Function:        09/13/2022    1:56 PM  6CIT Screen  What Year? 0 points  What month? 0 points  What time? 0 points  Count back from 20 0 points  Months in reverse 0 points  Repeat phrase 0 points  Total Score 0 points    Immunizations Immunization History  Administered Date(s) Administered   Hepatitis B 05/18/2015   Influenza,inj,Quad PF,6+ Mos 06/12/2022   Influenza-Unspecified 04/24/2007   Meningococcal Conjugate 01/09/2007   PFIZER(Purple Top)SARS-COV-2 Vaccination 05/06/2020, 06/03/2020   Pneumococcal Conjugate-13 05/18/2015   Pneumococcal Polysaccharide-23 05/11/2014   Tdap 09/24/2017    TDAP status: Up  to date  Flu Vaccine status: Up to date    Covid-19 vaccine status: Completed vaccines  Qualifies for Shingles Vaccine? Yes   Zostavax completed No   Shingrix Completed?: No.    Education has been provided regarding the importance of this vaccine. Patient has been advised to call insurance company to determine out of pocket expense if they have not yet received this vaccine. Advised may also receive vaccine at local pharmacy or Health Dept. Verbalized acceptance and understanding.  Screening Tests Health Maintenance  Topic Date Due   COVID-19 Vaccine (3 - Pfizer risk series) 09/29/2022 (Originally 07/01/2020)   Zoster Vaccines- Shingrix (1 of 2) 12/12/2022 (Originally 04/24/1979)   Fecal DNA (Cologuard)  09/14/2023 (Originally 04/23/2005)   PAP SMEAR-Modifier  12/18/2022   MAMMOGRAM  05/10/2023   Medicare Annual Wellness (AWV)  09/14/2023   DTaP/Tdap/Td (2 - Td or Tdap) 09/25/2027   INFLUENZA VACCINE  Completed   Hepatitis C Screening  Completed   HIV Screening  Completed   HPV VACCINES  Aged Out   COLONOSCOPY (Pts 45-63yr Insurance coverage will need to be confirmed)  Discontinued    Health Maintenance  There are no preventive care reminders to display for this patient.   Colorectal cancer screening: Referral to GI placed Deferred. Pt aware the office will call re: appt.   Mammogram status: Completed 05/09/21. Repeat every year    Lung Cancer Screening: (Low Dose CT Chest recommended if Age 63-80years, 30 pack-year currently smoking OR have quit w/in 15years.) does not qualify.     Additional Screening:  Hepatitis C Screening: does qualify; Completed 08/19/20  Vision Screening: Recommended annual ophthalmology exams for early detection of glaucoma and other disorders of the eye. Is the patient up to date with their annual eye exam?  Yes  Who is the provider or what is the name of the office in which the patient attends annual eye exams? Dr BTwanna HyIf pt is not  established with a provider, would they like to be referred to a provider to establish care? Yes .   Dental Screening: Recommended annual dental exams for proper oral hygiene  Community Resource Referral / Chronic Care Management:  CRR required this visit?  No   CCM required this visit?  No      Plan:     I have personally reviewed and noted the following in the patient's chart:   Medical and social history Use of alcohol, tobacco or illicit drugs  Current medications and supplements including opioid prescriptions. Patient is currently taking opioid prescriptions. Information provided to patient regarding non-opioid alternatives. Patient advised to discuss non-opioid treatment plan with their provider. Functional ability and status Nutritional status Physical activity Advanced directives List of other physicians Hospitalizations, surgeries, and ER visits in previous 12 months Vitals Screenings to  include cognitive, depression, and falls Referrals and appointments  In addition, I have reviewed and discussed with patient certain preventive protocols, quality metrics, and best practice recommendations. A written personalized care plan for preventive services as well as general preventive health recommendations were provided to patient.     Criselda Peaches, LPN   [id number]   Nurse Notes: Patient request f/u with concerns of a dark spot on rt foot. Patient denies pain or discomfort. Patient would like to discuss concerns.

## 2022-09-13 NOTE — Patient Instructions (Addendum)
[name] , Thank you for taking time to come for your Medicare Wellness Visit. I appreciate your ongoing commitment to your health goals. Please review the following plan we discussed and let me know if I can assist you in the future.   These are the goals we discussed:  Goals       Patient stated (pt-stated)      I would like to get into Real Estate.        This is a list of the screening recommended for you and due dates:  Health Maintenance  Topic Date Due   COVID-19 Vaccine (3 - Pfizer risk series) 09/29/2022*   Zoster (Shingles) Vaccine (1 of 2) 12/12/2022*   Cologuard (Stool DNA test)  09/14/2023*   Pap Smear  12/18/2022   Mammogram  05/10/2023   Medicare Annual Wellness Visit  09/14/2023   DTaP/Tdap/Td vaccine (2 - Td or Tdap) 09/25/2027   Flu Shot  Completed   Hepatitis C Screening: USPSTF Recommendation to screen - Ages 18-79 yo.  Completed   HIV Screening  Completed   HPV Vaccine  Aged Out   Colon Cancer Screening  Discontinued  *Topic was postponed. The date shown is not the original due date.   Opioid Pain Medicine Management Opioids are powerful medicines that are used to treat moderate to severe pain. When used for short periods of time, they can help you to: Sleep better. Do better in physical or occupational therapy. Feel better in the first few days after an injury. Recover from surgery. Opioids should be taken with the supervision of a trained health care provider. They should be taken for the shortest period of time possible. This is because opioids can be addictive, and the longer you take opioids, the greater your risk of addiction. This addiction can also be called opioid use disorder. What are the risks? Using opioid pain medicines for longer than 3 days increases your risk of side effects. Side effects include: Constipation. Nausea and vomiting. Breathing difficulties (respiratory depression). Drowsiness. Confusion. Opioid use  disorder. Itching. Taking opioid pain medicine for a long period of time can affect your ability to do daily tasks. It also puts you at risk for: Motor vehicle crashes. Depression. Suicide. Heart attack. Overdose, which can be life-threatening. What is a pain treatment plan? A pain treatment plan is an agreement between you and your health care provider. Pain is unique to each person, and treatments vary depending on your condition. To manage your pain, you and your health care provider need to work together. To help you do this: Discuss the goals of your treatment, including how much pain you might expect to have and how you will manage the pain. Review the risks and benefits of taking opioid medicines. Remember that a good treatment plan uses more than one approach and minimizes the chance of side effects. Be honest about the amount of medicines you take and about any drug or alcohol use. Get pain medicine prescriptions from only one health care provider. Pain can be managed with many types of alternative treatments. Ask your health care provider to refer you to one or more specialists who can help you manage pain through: Physical or occupational therapy. Counseling (cognitive behavioral therapy). Good nutrition. Biofeedback. Massage. Meditation. Non-opioid medicine. Following a gentle exercise program. How to use opioid pain medicine Taking medicine Take your pain medicine exactly as told by your health care provider. Take it only when you need it. If your pain gets less  severe, you may take less than your prescribed dose if your health care provider approves. If you are not having pain, do nottake pain medicine unless your health care provider tells you to take it. If your pain is severe, do nottry to treat it yourself by taking more pills than instructed on your prescription. Contact your health care provider for help. Write down the times when you take your pain medicine. It is  easy to become confused while on pain medicine. Writing the time can help you avoid overdose. Take other over-the-counter or prescription medicines only as told by your health care provider. Keeping yourself and others safe  While you are taking opioid pain medicine: Do not drive, use machinery, or power tools. Do not sign legal documents. Do not drink alcohol. Do not take sleeping pills. Do not supervise children by yourself. Do not do activities that require climbing or being in high places. Do not go to a lake, river, ocean, spa, or swimming pool. Do not share your pain medicine with anyone. Keep pain medicine in a locked cabinet or in a secure area where pets and children cannot reach it. Stopping your use of opioids If you have been taking opioid medicine for more than a few weeks, you may need to slowly decrease (taper) how much you take until you stop completely. Tapering your use of opioids can decrease your risk of symptoms of withdrawal, such as: Pain and cramping in the abdomen. Nausea. Sweating. Sleepiness. Restlessness. Uncontrollable shaking (tremors). Cravings for the medicine. Do not attempt to taper your use of opioids on your own. Talk with your health care provider about how to do this. Your health care provider may prescribe a step-down schedule based on how much medicine you are taking and how long you have been taking it. Getting rid of leftover pills Do not save any leftover pills. Get rid of leftover pills safely by: Taking the medicine to a prescription take-back program. This is usually offered by the county or law enforcement. Bringing them to a pharmacy that has a drug disposal container. Flushing them down the toilet. Check the label or package insert of your medicine to see whether this is safe to do. Throwing them out in the trash. Check the label or package insert of your medicine to see whether this is safe to do. If it is safe to throw it out, remove  the medicine from the original container, put it into a sealable bag or container, and mix it with used coffee grounds, food scraps, dirt, or cat litter before putting it in the trash. Follow these instructions at home: Activity Do exercises as told by your health care provider. Avoid activities that make your pain worse. Return to your normal activities as told by your health care provider. Ask your health care provider what activities are safe for you. General instructions You may need to take these actions to prevent or treat constipation: Drink enough fluid to keep your urine pale yellow. Take over-the-counter or prescription medicines. Eat foods that are high in fiber, such as beans, whole grains, and fresh fruits and vegetables. Limit foods that are high in fat and processed sugars, such as fried or sweet foods. Keep all follow-up visits. This is important. Where to find support If you have been taking opioids for a long time, you may benefit from receiving support for quitting from a local support group or counselor. Ask your health care provider for a referral to these resources in your  area. Where to find more information Centers for Disease Control and Prevention (CDC): http://www.wolf.info/ [place] Food and Drug Administration (FDA): [place] Get help right away if: You may have taken too much of an opioid (overdosed). Common symptoms of an overdose: Your breathing is slower or more shallow than normal. You have a very slow heartbeat (pulse). You have slurred speech. You have nausea and vomiting. Your pupils become very small. You have other potential symptoms: You are very confused. You faint or feel like you will faint. You have cold, clammy skin. You have blue lips or fingernails. You have thoughts of harming yourself or harming others. These symptoms may represent a serious problem that is an emergency. Do not wait to see if the symptoms will go away. Get medical help right away.  Call your local emergency services (911 in the [place] Do not drive yourself to the hospital.  If you ever feel like you may hurt yourself or others, or have thoughts about taking your own life, get help right away. Go to your nearest emergency department or: Call your local emergency services (911 in the [place] Call the [facility] (862)784-3176 in the [place] Call a suicide crisis helpline, such as the [place] at (340) 061-9259 or 988 in the [place]. This is open 24 hours a day in the [place] Text the Crisis Text Line at (919) 882-6319 (in the [place].). Summary Opioid medicines can help you manage moderate to severe pain for a short period of time. A pain treatment plan is an agreement between you and your health care provider. Discuss the goals of your treatment, including how much pain you might expect to have and how you will manage the pain. If you think that you or someone else may have taken too much of an opioid, get medical help right away. This information is not intended to replace advice given to you by your health care provider. Make sure you discuss any questions you have with your health care provider. Document Revised: 02/01/2021 Document Reviewed: 10/19/2020 Elsevier Patient Education  [place] directives: Advance directive discussed with you today. Even though you declined this today, please call our office should you change your mind, and we can give you the proper paperwork for you to fill out.   Conditions/risks identified: None  Next appointment: Follow up in one year for your annual wellness visit.    Preventive Care 40-64 Years, Female Preventive care refers to lifestyle choices and visits with your health care provider that can promote health and wellness. What does preventive care include? A yearly physical exam. This is also called an annual well check. Dental exams once or twice a year. Routine eye exams. Ask  your health care provider how often you should have your eyes checked. Personal lifestyle choices, including: Daily care of your teeth and gums. Regular physical activity. Eating a healthy diet. Avoiding tobacco and drug use. Limiting alcohol use. Practicing safe sex. Taking low-dose aspirin daily starting at age 82. Taking vitamin and mineral supplements as recommended by your health care provider. What happens during an annual well check? The services and screenings done by your health care provider during your annual well check will depend on your age, overall health, lifestyle risk factors, and family history of disease. Counseling  Your health care provider may ask you questions about your: Alcohol use. Tobacco use. Drug use. Emotional well-being. Home and relationship well-being. Sexual activity. Eating habits. Work and work Statistician. Method of  birth control. Menstrual cycle. Pregnancy history. Screening  You may have the following tests or measurements: Height, weight, and BMI. Blood pressure. Lipid and cholesterol levels. These may be checked every 5 years, or more frequently if you are over 14 years old. Skin check. Lung cancer screening. You may have this screening every year starting at age 31 if you have a 30-pack-year history of smoking and currently smoke or have quit within the past 15 years. Fecal occult blood test (FOBT) of the stool. You may have this test every year starting at age 31. Flexible sigmoidoscopy or colonoscopy. You may have a sigmoidoscopy every 5 years or a colonoscopy every 10 years starting at age 10. Hepatitis C blood test. Hepatitis B blood test. Sexually transmitted disease (STD) testing. Diabetes screening. This is done by checking your blood sugar (glucose) after you have not eaten for a while (fasting). You may have this done every 1-3 years. Mammogram. This may be done every 1-2 years. Talk to your health care provider about when you  should start having regular mammograms. This may depend on whether you have a family history of breast cancer. BRCA-related cancer screening. This may be done if you have a family history of breast, ovarian, tubal, or peritoneal cancers. Pelvic exam and Pap test. This may be done every 3 years starting at age 25. Starting at age 25, this may be done every 5 years if you have a Pap test in combination with an HPV test. Bone density scan. This is done to screen for osteoporosis. You may have this scan if you are at high risk for osteoporosis. Discuss your test results, treatment options, and if necessary, the need for more tests with your health care provider. Vaccines  Your health care provider may recommend certain vaccines, such as: Influenza vaccine. This is recommended every year. Tetanus, diphtheria, and acellular pertussis (Tdap, Td) vaccine. You may need a Td booster every 10 years. Zoster vaccine. You may need this after age 66. Pneumococcal 13-valent conjugate (PCV13) vaccine. You may need this if you have certain conditions and were not previously vaccinated. Pneumococcal polysaccharide (PPSV23) vaccine. You may need one or two doses if you smoke cigarettes or if you have certain conditions. Talk to your health care provider about which screenings and vaccines you need and how often you need them. This information is not intended to replace advice given to you by your health care provider. Make sure you discuss any questions you have with your health care provider. Document Released: 08/05/2015 Document Revised: 03/28/2016 Document Reviewed: 05/10/2015 Elsevier Interactive Patient Education  2017 [place] Prevention in the [place] can cause injuries. They can happen to people of all ages. There are many things you can do to make your home safe and to help prevent falls. What can I do on the outside of my home? Regularly fix the edges of walkways and driveways and fix  any cracks. Remove anything that might make you trip as you walk through a door, such as a raised step or threshold. Trim any bushes or trees on the path to your home. Use bright outdoor lighting. Clear any walking paths of anything that might make someone trip, such as rocks or tools. Regularly check to see if handrails are loose or broken. Make sure that both sides of any steps have handrails. Any raised decks and porches should have guardrails on the edges. Have any leaves, snow, or ice cleared regularly. Use sand or  salt on walking paths during winter. Clean up any spills in your garage right away. This includes oil or grease spills. What can I do in the bathroom? Use night lights. Install grab bars by the toilet and in the tub and shower. Do not use towel bars as grab bars. Use non-skid mats or decals in the tub or shower. If you need to sit down in the shower, use a plastic, non-slip stool. Keep the floor dry. Clean up any water that spills on the floor as soon as it happens. Remove soap buildup in the tub or shower regularly. Attach bath mats securely with double-sided non-slip rug tape. Do not have throw rugs and other things on the floor that can make you trip. What can I do in the bedroom? Use night lights. Make sure that you have a light by your bed that is easy to reach. Do not use any sheets or blankets that are too big for your bed. They should not hang down onto the floor. Have a firm chair that has side arms. You can use this for support while you get dressed. Do not have throw rugs and other things on the floor that can make you trip. What can I do in the kitchen? Clean up any spills right away. Avoid walking on wet floors. Keep items that you use a lot in easy-to-reach places. If you need to reach something above you, use a strong step stool that has a grab bar. Keep electrical cords out of the way. Do not use floor polish or wax that makes floors slippery. If you  must use wax, use non-skid floor wax. Do not have throw rugs and other things on the floor that can make you trip. What can I do with my stairs? Do not leave any items on the stairs. Make sure that there are handrails on both sides of the stairs and use them. Fix handrails that are broken or loose. Make sure that handrails are as long as the stairways. Check any carpeting to make sure that it is firmly attached to the stairs. Fix any carpet that is loose or worn. Avoid having throw rugs at the top or bottom of the stairs. If you do have throw rugs, attach them to the floor with carpet tape. Make sure that you have a light switch at the top of the stairs and the bottom of the stairs. If you do not have them, ask someone to add them for you. What else can I do to help prevent falls? Wear shoes that: Do not have high heels. Have rubber bottoms. Are comfortable and fit you well. Are closed at the toe. Do not wear sandals. If you use a stepladder: Make sure that it is fully opened. Do not climb a closed stepladder. Make sure that both sides of the stepladder are locked into place. Ask someone to hold it for you, if possible. Clearly mark and make sure that you can see: Any grab bars or handrails. First and last steps. Where the edge of each step is. Use tools that help you move around (mobility aids) if they are needed. These include: Canes. Walkers. Scooters. Crutches. Turn on the lights when you go into a dark area. Replace any light bulbs as soon as they burn out. Set up your furniture so you have a clear path. Avoid moving your furniture around. If any of your floors are uneven, fix them. If there are any pets around you, be aware of where  they are. Review your medicines with your doctor. Some medicines can make you feel dizzy. This can increase your chance of falling. Ask your doctor what other things that you can do to help prevent falls. This information is not intended to replace  advice given to you by your health care provider. Make sure you discuss any questions you have with your health care provider. Document Released: 05/05/2009 Document Revised: 12/15/2015 Document Reviewed: 08/13/2014 Elsevier Interactive Patient Education  2017 [employer].

## 2022-09-25 ENCOUNTER — Encounter: Payer: Self-pay | Admitting: Family Medicine

## 2022-09-25 ENCOUNTER — Ambulatory Visit (INDEPENDENT_AMBULATORY_CARE_PROVIDER_SITE_OTHER): Payer: 59 | Admitting: Family Medicine

## 2022-09-25 VITALS — BP 115/57 | HR 71 | Temp 97.3°F | Wt 147.6 lb

## 2022-09-25 DIAGNOSIS — F17200 Nicotine dependence, unspecified, uncomplicated: Secondary | ICD-10-CM

## 2022-09-25 DIAGNOSIS — E559 Vitamin D deficiency, unspecified: Secondary | ICD-10-CM | POA: Diagnosis not present

## 2022-09-25 DIAGNOSIS — D571 Sickle-cell disease without crisis: Secondary | ICD-10-CM | POA: Diagnosis not present

## 2022-09-25 DIAGNOSIS — D649 Anemia, unspecified: Secondary | ICD-10-CM

## 2022-09-25 DIAGNOSIS — Z13228 Encounter for screening for other metabolic disorders: Secondary | ICD-10-CM | POA: Diagnosis not present

## 2022-09-25 NOTE — Progress Notes (Unsigned)
Established Patient Office Visit  Subjective   Patient ID: [name], female    DOB: [birth year]  Age: 62 y.o. MRN: [id number]  Chief Complaint  Patient presents with   Sickle Cell Anemia    Follow up    [name] is a 62 year old female with a medical history significant for sickle cell disease, rheumatoid arthritis, anemia of chronic disease, and hypothyroidism presents for a follow-up of chronic conditions.  Patient is complaining of increased pain to low back and lower extremities. Pain intensity is 6/10, intermittent, and aching. Patient also endorses fatigue.  She denies dizziness, shortness of breath, chest pain, nausea, vomiting, or diarrhea.     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   Thrombophlebitis leg superficial 03/04/2013   Rheumatoid arthritis 12/16/2012   Thyroid disease 11/27/2011   Past Medical History:  Diagnosis Date   Aneurysm (HCC)    Anxiety    Arthritis    Colon ulcer    Gall stones    Hypokalemia    Hypothyroidism    Pneumonia    Sickle cell anemia (HCC)    Ulcers of both lower extremities (HCC)    Past Surgical History:  Procedure Laterality Date   ANKLE SURGERY  1989 and 1990   CEREBRAL ANEURYSM REPAIR     COLONOSCOPY  05/12/2012   Procedure: COLONOSCOPY;  Surgeon: Hart Carwin, MD;  Location: [facility];  Service: Endoscopy;  Laterality: N/A;   ESOPHAGOGASTRODUODENOSCOPY  05/12/2012    Procedure: ESOPHAGOGASTRODUODENOSCOPY (EGD);  Surgeon: Hart Carwin, MD;  Location: [facility];  Service: Endoscopy;  Laterality: N/A;   gall stone removal     GIVENS CAPSULE STUDY  05/13/2012   Procedure: GIVENS CAPSULE STUDY;  Surgeon: Hart Carwin, MD;  Location: [facility];  Service: Endoscopy;  Laterality: N/A;   Social History   Tobacco Use   Smoking status: Never   Smokeless tobacco: Never  Vaping Use   Vaping Use: Never used  Substance Use Topics   Alcohol use: Yes    Comment: Occasional   Drug use: No   Social History   Socioeconomic History   Marital status: Single    Spouse name: Not on file   Number of children: Not on file   Years of education: Not on file   Highest education level: Not on file  Occupational History   Not on file  Tobacco Use   Smoking status: Never   Smokeless tobacco: Never  Vaping Use   Vaping Use: Never used  Substance and Sexual Activity   Alcohol use: Yes    Comment: Occasional   Drug use: No   Sexual activity: Not Currently  Other Topics Concern   Not on file  Social History Narrative   Not on file   Social Determinants of Health   Financial Resource Strain: Low Risk  (09/13/2022)   Overall Financial Resource  Strain (CARDIA)    Difficulty of Paying Living Expenses: Not hard at all  Food Insecurity: Food Insecurity Present (05/17/2022)   Hunger Vital Sign    Worried About Running Out of Food in the Last Year: Sometimes true    Ran Out of Food in the Last Year: Sometimes true  Transportation Needs: No Transportation Needs (09/13/2022)   PRAPARE - Administrator, Civil Service (Medical): No    Lack of Transportation (Non-Medical): No  Physical Activity: Inactive (09/13/2022)   Exercise Vital Sign    Days of Exercise per Week: 0 days    Minutes of Exercise per Session: 0 min  Stress: No Stress Concern Present (09/13/2022)   [employer] of Occupational Health - Occupational Stress Questionnaire    Feeling  of Stress : Not at all  Social Connections: Socially Isolated (09/13/2022)   Social Connection and Isolation Panel [NHANES]    Frequency of Communication with Friends and Family: More than three times a week    Frequency of Social Gatherings with Friends and Family: More than three times a week    Attends Religious Services: Never    Database administrator or Organizations: No    Attends Banker Meetings: Never    Marital Status: Never married  Intimate Partner Violence: Not At Risk (09/13/2022)   Humiliation, Afraid, Rape, and Kick questionnaire    Fear of Current or Ex-Partner: No    Emotionally Abused: No    Physically Abused: No    Sexually Abused: No   Family Status  Relation Name Status   Mother  Alive   Father  Deceased   Sister  Alive   Brother  Alive   Sister  Alive   Neg Hx  (Not Specified)   Family History  Problem Relation Age of Onset   Cancer Mother 38       Esophageal   Sickle cell trait Mother    Sickle cell trait Father    HIV/AIDS Brother    Colon cancer Neg Hx    Allergies  Allergen Reactions   Ampicillin Other (See Comments)    Caused red bumps on the skin, but nothing else Per patient, she tolerates cephalosporins without problems (??)   Demerol [Meperidine] Other (See Comments)    A high(er) dose caused seizure(s)      Review of Systems  Constitutional:  Positive for malaise/fatigue.  HENT: Negative.    Eyes: Negative.   Respiratory: Negative.    Cardiovascular: Negative.   Gastrointestinal: Negative.   Genitourinary: Negative.   Musculoskeletal:  Positive for back pain and joint pain.  Skin: Negative.   Neurological: Negative.   Psychiatric/Behavioral: Negative.        Objective:     BP (!) 115/57   Pulse 71   Temp (!) 97.3 F (36.3 C)   Wt 147 lb 9.6 oz (67 kg)   SpO2 97%   BMI 20.59 kg/m  BP Readings from Last 3 Encounters:  09/27/22 (!) 121/52  09/25/22 (!) 115/57  09/13/22 126/64   Wt Readings from Last 3  Encounters:  09/25/22 147 lb 9.6 oz (67 kg)  09/13/22 144 lb 3.2 oz (65.4 kg)  06/26/22 149 lb 12.8 oz (67.9 kg)      Physical Exam Constitutional:      Appearance: Normal appearance.  Eyes:     Pupils: Pupils are equal, round, and reactive to light.  Cardiovascular:     Rate and Rhythm: Normal rate and regular  rhythm.     Pulses: Normal pulses.  Pulmonary:     Effort: Pulmonary effort is normal.  Abdominal:     General: Bowel sounds are normal.  Musculoskeletal:        General: Normal range of motion.  Skin:    General: Skin is warm.  Neurological:     General: No focal deficit present.     Mental Status: She is alert. Mental status is at baseline.  Psychiatric:        Mood and Affect: Mood normal.        Behavior: Behavior normal.        Thought Content: Thought content normal.        Judgment: Judgment normal.      Results for orders placed or performed in visit on 09/25/22  Sickle Cell Panel  Result Value Ref Range   Glucose 95 70 - 99 mg/dL   BUN 8 8 - 27 mg/dL   Creatinine, Ser 4.66 0.57 - 1.00 mg/dL   eGFR 75 >59 DJ/TTS/1.77   BUN/Creatinine Ratio 9 (L) 12 - 28   Sodium 137 134 - 144 mmol/L   Potassium 4.0 3.5 - 5.2 mmol/L   Chloride 104 96 - 106 mmol/L   CO2 18 (L) 20 - 29 mmol/L   Calcium 9.5 8.7 - 10.3 mg/dL   Total Protein 7.1 6.0 - 8.5 g/dL   Albumin 4.0 3.9 - 4.9 g/dL   Globulin, Total 3.1 1.5 - 4.5 g/dL   Albumin/Globulin Ratio 1.3 1.2 - 2.2   Bilirubin Total 2.4 (H) 0.0 - 1.2 mg/dL   Alkaline Phosphatase 90 44 - 121 IU/L   AST 50 (H) 0 - 40 IU/L   ALT 13 0 - 32 IU/L   Ferritin 396 (H) 15 - 150 ng/mL   Vit D, 25-Hydroxy 19.8 (L) 30.0 - 100.0 ng/mL   WBC 9.3 3.4 - 10.8 x10E3/uL   RBC 1.87 (LL) 3.77 - 5.28 x10E6/uL   Hemoglobin 5.8 (LL) 11.1 - 15.9 g/dL   Hematocrit 93.9 (LL) 34.0 - 46.6 %   MCV 87 79 - 97 fL   MCH 31.0 26.6 - 33.0 pg   MCHC 35.8 (H) 31.5 - 35.7 g/dL   RDW 03.0 (H) 09.2 - 33.0 %   Platelets 223 150 - 450 x10E3/uL    Neutrophils 47 Not Estab. %   Lymphs 31 Not Estab. %   Monocytes 13 Not Estab. %   Eos 7 Not Estab. %   Basos 1 Not Estab. %   Neutrophils Absolute 4.5 1.4 - 7.0 x10E3/uL   Lymphocytes Absolute 2.9 0.7 - 3.1 x10E3/uL   Monocytes Absolute 1.2 (H) 0.1 - 0.9 x10E3/uL   EOS (ABSOLUTE) 0.6 (H) 0.0 - 0.4 x10E3/uL   Basophils Absolute 0.1 0.0 - 0.2 x10E3/uL   Immature Granulocytes 1 Not Estab. %   Immature Grans (Abs) 0.1 0.0 - 0.1 x10E3/uL   NRBC 28 (H) 0 - 0 %   Hematology Comments: Note:    Retic Ct Pct 19.4 (H) 0.6 - 2.6 %    Last CBC Lab Results  Component Value Date   WBC 8.0 10/12/2022   HGB 8.1 (L) 10/12/2022   HCT 22.7 (L) 10/12/2022   MCV 88.7 10/12/2022   MCH 31.6 10/12/2022   RDW 22.5 (H) 10/12/2022   PLT 181 10/12/2022   Last metabolic panel Lab Results  Component Value Date   GLUCOSE 104 (H) 10/12/2022   NA 138 10/12/2022   K 3.3 (L) 10/12/2022  CL 109 10/12/2022   CO2 22 10/12/2022   BUN 9 10/12/2022   CREATININE 0.52 10/12/2022   GFRNONAA >60 10/12/2022   CALCIUM 9.2 10/12/2022   PHOS 3.6 05/18/2022   PROT 7.7 10/12/2022   ALBUMIN 3.8 10/12/2022   LABGLOB 3.1 09/25/2022   AGRATIO 1.3 09/25/2022   BILITOT 2.1 (H) 10/12/2022   ALKPHOS 97 10/12/2022   AST 55 (H) 10/12/2022   ALT 24 10/12/2022   ANIONGAP 7 10/12/2022   Last lipids Lab Results  Component Value Date   CHOL 255 (H) 06/26/2022   HDL 27 (L) 06/26/2022   LDLCALC 171 (H) 06/26/2022   TRIG 298 (H) 06/26/2022   CHOLHDL 9.4 (H) 06/26/2022   Last hemoglobin A1c No results found for: "HGBA1C" Last thyroid functions Lab Results  Component Value Date   TSH 229.954 (H) 06/12/2022   T4TOTAL 4.1 (L) 03/27/2022   Last vitamin D Lab Results  Component Value Date   VD25OH 19.8 (L) 09/25/2022   Last vitamin B12 and Folate Lab Results  Component Value Date   VITAMINB12 347 12/27/2020   FOLATE 15.1 12/27/2020      The 10-year ASCVD risk score (Arnett DK, et al., 2019) is: 23.2%     Assessment & Plan:   Problem List Items Addressed This Visit       Other   Sickle cell anemia - Primary   Relevant Orders   Sickle Cell Panel (Completed)   Other Visit Diagnoses     Vitamin D deficiency       Tobacco dependence       Symptomatic anemia          1. Sickle cell disease without crisis (HCC)  Continue folic acid 1 mg daily to prevent aplastic bone marrow crises.   Pulmonary evaluation - Patient denies severe recurrent wheezes, shortness of breath with exercise, or persistent cough. If these symptoms develop, pulmonary function tests with spirometry will be ordered, and if abnormal, plan on referral to Pulmonology for further evaluation.  Cardiac - Routine screening for pulmonary hypertension is not recommended.  Eye - High risk of proliferative retinopathy. Annual eye exam with retinal exam recommended to patient.  - Sickle Cell Panel  2. Vitamin D deficiency  - Sickle Cell Panel  3. Tobacco dependence Smoking cessation instruction/counseling given:  counseled patient on the dangers of tobacco use, advised patient to stop smoking, and reviewed strategies to maximize success   4. Symptomatic anemia Will follow up by phone with lab results. Possible transfusion in day hospital if hemoglobin is < 6.   Return in about 3 months (around 12/26/2022) for sickle cell anemia.   Nolon NationsLachina Moore Dorea Duff  APRN, MSN, FNP-C [facility] [address]  [place], [place] [id number] [phone]

## 2022-09-26 ENCOUNTER — Other Ambulatory Visit: Payer: Self-pay | Admitting: Family Medicine

## 2022-09-26 ENCOUNTER — Telehealth (HOSPITAL_COMMUNITY): Payer: Self-pay

## 2022-09-26 NOTE — Telephone Encounter (Signed)
Per [place] Hollis, this pt Hgb resulted at 5.8 and pt needs to come in tomorrow for blood transfusion. RN called pt and informed her about need for blood transfusion. Pt verbalized understanding. Pt said they can come to clinic tomorrow (3/7) at 9 AM. Pt will be added to schedule.

## 2022-09-26 NOTE — Progress Notes (Signed)
[name] is a 61 year old female with a medical history significant for sickle cell disease, chronic pain, and rheumatoid arthritis presented for a 73-monthfollow-up.  Reviewed all laboratory values, hemoglobin decreased below baseline at 5.8.  Will notify patient to present on 09/27/2022 for blood transfusion.  Will transfuse 1 unit PRBCs with premedications at that time.  Day hospital staff to contact patient with lab results and appointment time.  Carol Pounds APRN, MSN, FNP-C [place] [phone]

## 2022-09-27 ENCOUNTER — Non-Acute Institutional Stay (HOSPITAL_COMMUNITY)
Admission: RE | Admit: 2022-09-27 | Discharge: 2022-09-27 | Disposition: A | Discharge status: Home / Self Care | Payer: 59 | Source: Ambulatory Visit | Attending: Internal Medicine | Admitting: Internal Medicine

## 2022-09-27 ENCOUNTER — Other Ambulatory Visit: Payer: Self-pay | Admitting: Family Medicine

## 2022-09-27 DIAGNOSIS — D649 Anemia, unspecified: Secondary | ICD-10-CM | POA: Diagnosis not present

## 2022-09-27 LAB — PREPARE RBC (CROSSMATCH)

## 2022-09-27 LAB — HEMOGLOBIN AND HEMATOCRIT, BLOOD
HCT: 17 % — ABNORMAL LOW (ref 36.0–46.0)
Hemoglobin: 6.3 g/dL — CL (ref 12.0–15.0)

## 2022-09-27 MED ORDER — ACETAMINOPHEN 325 MG PO TABS
650.0000 mg | ORAL_TABLET | Freq: Once | ORAL | Status: AC
  Administered 2022-09-27: 650 mg via ORAL
  Filled 2022-09-27: qty 2

## 2022-09-27 MED ORDER — SODIUM CHLORIDE 0.9% IV SOLUTION: Freq: Once | INTRAVENOUS | Status: AC

## 2022-09-27 MED ORDER — DIPHENHYDRAMINE HCL 50 MG/ML IJ SOLN
25.0000 mg | Freq: Once | INTRAMUSCULAR | Status: AC
  Administered 2022-09-27: 25 mg via INTRAVENOUS
  Filled 2022-09-27: qty 1

## 2022-09-27 NOTE — Progress Notes (Signed)
Critical Value  Test result: Hemoglobin 6.3  Time and Date notified: 09/27/22 at 1540  Provider notified: Ned Clines, MD  Action taken:  No new orders. Patient just received blood and value WNL for patient.

## 2022-09-27 NOTE — Progress Notes (Signed)
PATIENT CARE CENTER NOTE   Diagnosis: Symptomatic anemia    Provider: Hollis, [place], FNP   Procedure: 1 unit PRBC   Note: Patient received 1 unit blood via PIV. Patient pre-medicated with Tylenol and IV Benadryl per order. Patient tolerated transfusion well with no adverse reaction. Vital signs stable. Post transfusion H & H drawn. Discharge instructions given. Patient alert, oriented and ambulatory at discharge.

## 2022-09-28 LAB — CMP14+CBC/D/PLT+FER+RETIC+V...
ALT: 13 IU/L (ref 0–32)
AST: 50 IU/L — ABNORMAL HIGH (ref 0–40)
Albumin/Globulin Ratio: 1.3 (ref 1.2–2.2)
Albumin: 4 g/dL (ref 3.9–4.9)
Alkaline Phosphatase: 90 IU/L (ref 44–121)
BUN/Creatinine Ratio: 9 — ABNORMAL LOW (ref 12–28)
BUN: 8 mg/dL (ref 8–27)
Basophils Absolute: 0.1 10*3/uL (ref 0.0–0.2)
Basos: 1 %
Bilirubin Total: 2.4 mg/dL — ABNORMAL HIGH (ref 0.0–1.2)
CO2: 18 mmol/L — ABNORMAL LOW (ref 20–29)
Calcium: 9.5 mg/dL (ref 8.7–10.3)
Chloride: 104 mmol/L (ref 96–106)
Creatinine, Ser: 0.87 mg/dL (ref 0.57–1.00)
EOS (ABSOLUTE): 0.6 10*3/uL — ABNORMAL HIGH (ref 0.0–0.4)
Eos: 7 %
Ferritin: 396 ng/mL — ABNORMAL HIGH (ref 15–150)
Globulin, Total: 3.1 g/dL (ref 1.5–4.5)
Glucose: 95 mg/dL (ref 70–99)
Hematocrit: 16.2 % — CL (ref 34.0–46.6)
Hemoglobin: 5.8 g/dL — CL (ref 11.1–15.9)
Immature Grans (Abs): 0.1 10*3/uL (ref 0.0–0.1)
Immature Granulocytes: 1 %
Lymphocytes Absolute: 2.9 10*3/uL (ref 0.7–3.1)
Lymphs: 31 %
MCH: 31 pg (ref 26.6–33.0)
MCHC: 35.8 g/dL — ABNORMAL HIGH (ref 31.5–35.7)
MCV: 87 fL (ref 79–97)
Monocytes Absolute: 1.2 10*3/uL — ABNORMAL HIGH (ref 0.1–0.9)
Monocytes: 13 %
NRBC: 28 % — ABNORMAL HIGH (ref 0–0)
Neutrophils Absolute: 4.5 10*3/uL (ref 1.4–7.0)
Neutrophils: 47 %
Platelets: 223 10*3/uL (ref 150–450)
Potassium: 4 mmol/L (ref 3.5–5.2)
RBC: 1.87 x10E6/uL — CL (ref 3.77–5.28)
RDW: 19.8 % — ABNORMAL HIGH (ref 11.7–15.4)
Retic Ct Pct: 19.4 % — ABNORMAL HIGH (ref 0.6–2.6)
Sodium: 137 mmol/L (ref 134–144)
Total Protein: 7.1 g/dL (ref 6.0–8.5)
Vit D, 25-Hydroxy: 19.8 ng/mL — ABNORMAL LOW (ref 30.0–100.0)
WBC: 9.3 10*3/uL (ref 3.4–10.8)
eGFR: 75 mL/min/{1.73_m2} (ref >59)

## 2022-09-28 LAB — TYPE AND SCREEN
ABO/RH(D): B POS
Antibody Screen: NEGATIVE
Unit division: 0

## 2022-09-28 LAB — BPAM RBC
Blood Product Expiration Date: 202403292359
ISSUE DATE / TIME: 202403071134
Unit Type and Rh: 9500

## 2022-10-08 DIAGNOSIS — M0579 Rheumatoid arthritis with rheumatoid factor of multiple sites without organ or systems involvement: Secondary | ICD-10-CM | POA: Diagnosis not present

## 2022-10-09 DIAGNOSIS — D649 Anemia, unspecified: Secondary | ICD-10-CM | POA: Diagnosis not present

## 2022-10-09 DIAGNOSIS — I509 Heart failure, unspecified: Secondary | ICD-10-CM | POA: Diagnosis not present

## 2022-10-09 DIAGNOSIS — E039 Hypothyroidism, unspecified: Secondary | ICD-10-CM | POA: Diagnosis not present

## 2022-10-09 DIAGNOSIS — K921 Melena: Secondary | ICD-10-CM | POA: Diagnosis not present

## 2022-10-11 ENCOUNTER — Telehealth: Payer: Self-pay | Admitting: Family Medicine

## 2022-10-11 NOTE — Telephone Encounter (Signed)
FYI - pt LVM concerned about receiving 2 pints of blood @ [facility] on Tuesday after receiving blood last week. Needs hosp f/u appt ASAP. Scheduled with Tonya on 3/22. Pt requested I inform [place].

## 2022-10-12 ENCOUNTER — Inpatient Hospital Stay: Payer: Self-pay | Admitting: Nurse Practitioner

## 2022-10-12 ENCOUNTER — Non-Acute Institutional Stay (HOSPITAL_COMMUNITY)
Admission: RE | Admit: 2022-10-12 | Discharge: 2022-10-12 | Disposition: A | Discharge status: Home / Self Care | Payer: 59 | Source: Ambulatory Visit | Attending: Internal Medicine | Admitting: Internal Medicine

## 2022-10-12 DIAGNOSIS — D649 Anemia, unspecified: Secondary | ICD-10-CM | POA: Diagnosis not present

## 2022-10-12 DIAGNOSIS — D571 Sickle-cell disease without crisis: Secondary | ICD-10-CM | POA: Diagnosis not present

## 2022-10-12 LAB — CBC WITH DIFFERENTIAL/PLATELET
Abs Immature Granulocytes: 0.01 10*3/uL (ref 0.00–0.07)
Basophils Absolute: 0.1 10*3/uL (ref 0.0–0.1)
Basophils Relative: 1 %
Eosinophils Absolute: 1.2 10*3/uL — ABNORMAL HIGH (ref 0.0–0.5)
Eosinophils Relative: 15 %
HCT: 22.7 % — ABNORMAL LOW (ref 36.0–46.0)
Hemoglobin: 8.1 g/dL — ABNORMAL LOW (ref 12.0–15.0)
Immature Granulocytes: 0 %
Lymphocytes Relative: 32 %
Lymphs Abs: 2.6 10*3/uL (ref 0.7–4.0)
MCH: 31.6 pg (ref 26.0–34.0)
MCHC: 35.7 g/dL (ref 30.0–36.0)
MCV: 88.7 fL (ref 80.0–100.0)
Monocytes Absolute: 1.1 10*3/uL — ABNORMAL HIGH (ref 0.1–1.0)
Monocytes Relative: 13 %
Neutro Abs: 3.1 10*3/uL (ref 1.7–7.7)
Neutrophils Relative %: 39 %
Platelets: 181 10*3/uL (ref 150–400)
RBC: 2.56 MIL/uL — ABNORMAL LOW (ref 3.87–5.11)
RDW: 22.5 % — ABNORMAL HIGH (ref 11.5–15.5)
WBC: 8 10*3/uL (ref 4.0–10.5)
nRBC: 45.2 % — ABNORMAL HIGH (ref 0.0–0.2)

## 2022-10-12 LAB — COMPREHENSIVE METABOLIC PANEL
ALT: 24 U/L (ref 0–44)
AST: 55 U/L — ABNORMAL HIGH (ref 15–41)
Albumin: 3.8 g/dL (ref 3.5–5.0)
Alkaline Phosphatase: 97 U/L (ref 38–126)
Anion gap: 7 (ref 5–15)
BUN: 9 mg/dL (ref 8–23)
CO2: 22 mmol/L (ref 22–32)
Calcium: 9.2 mg/dL (ref 8.9–10.3)
Chloride: 109 mmol/L (ref 98–111)
Creatinine, Ser: 0.52 mg/dL (ref 0.44–1.00)
GFR, Estimated: >60 mL/min (ref >60)
Glucose, Bld: 104 mg/dL — ABNORMAL HIGH (ref 70–99)
Potassium: 3.3 mmol/L — ABNORMAL LOW (ref 3.5–5.1)
Sodium: 138 mmol/L (ref 135–145)
Total Bilirubin: 2.1 mg/dL — ABNORMAL HIGH (ref 0.3–1.2)
Total Protein: 7.7 g/dL (ref 6.5–8.1)

## 2022-10-12 LAB — RETICULOCYTES
Immature Retic Fract: 32.1 % — ABNORMAL HIGH (ref 2.3–15.9)
RBC.: 2.7 MIL/uL — ABNORMAL LOW (ref 3.87–5.11)
Retic Count, Absolute: 544 10*3/uL — ABNORMAL HIGH (ref 19.0–186.0)
Retic Ct Pct: 20.1 % — ABNORMAL HIGH (ref 0.4–3.1)

## 2022-10-12 LAB — LACTATE DEHYDROGENASE: LDH: 624 U/L — ABNORMAL HIGH (ref 98–192)

## 2022-10-12 NOTE — Progress Notes (Signed)
PATIENT CARE CENTER NOTE   Diagnosis:  Acute on chronic anemia  - Primary D64.9   Hb-SS disease without crisis [facility]) D57.1     Provider: Hollis, [place], FNP   Procedure: Lab work   Note: Patient's labs (CBC w/diff, CMP, Retic, LDH)  drawn peripherally. Patient tolerated well with no adverse reaction. Hemoglobin 8.1. Provider notified and spoke with patient. Patient to come back in 2 weeks for repeat labs. Patient alert, oriented and ambulatory at discharge.

## 2022-10-22 DIAGNOSIS — Z79899 Other long term (current) drug therapy: Secondary | ICD-10-CM | POA: Diagnosis not present

## 2022-10-22 DIAGNOSIS — M069 Rheumatoid arthritis, unspecified: Secondary | ICD-10-CM | POA: Diagnosis not present

## 2022-10-23 ENCOUNTER — Other Ambulatory Visit: Payer: Self-pay | Admitting: Family Medicine

## 2022-10-23 DIAGNOSIS — D571 Sickle-cell disease without crisis: Secondary | ICD-10-CM

## 2022-10-26 ENCOUNTER — Encounter (HOSPITAL_COMMUNITY): Payer: 59

## 2022-11-02 ENCOUNTER — Non-Acute Institutional Stay (HOSPITAL_COMMUNITY)
Admission: RE | Admit: 2022-11-02 | Discharge: 2022-11-02 | Disposition: A | Discharge status: Home / Self Care | Payer: 59 | Source: Ambulatory Visit | Attending: Internal Medicine | Admitting: Internal Medicine

## 2022-11-02 DIAGNOSIS — D571 Sickle-cell disease without crisis: Secondary | ICD-10-CM | POA: Insufficient documentation

## 2022-11-02 DIAGNOSIS — I700 Atherosclerosis of aorta: Secondary | ICD-10-CM | POA: Diagnosis not present

## 2022-11-02 DIAGNOSIS — J439 Emphysema, unspecified: Secondary | ICD-10-CM | POA: Diagnosis not present

## 2022-11-02 DIAGNOSIS — Z122 Encounter for screening for malignant neoplasm of respiratory organs: Secondary | ICD-10-CM | POA: Diagnosis not present

## 2022-11-02 DIAGNOSIS — D3502 Benign neoplasm of left adrenal gland: Secondary | ICD-10-CM | POA: Diagnosis not present

## 2022-11-02 LAB — COMPREHENSIVE METABOLIC PANEL
ALT: 17 U/L (ref 0–44)
AST: 58 U/L — ABNORMAL HIGH (ref 15–41)
Albumin: 3.8 g/dL (ref 3.5–5.0)
Alkaline Phosphatase: 82 U/L (ref 38–126)
Anion gap: 6 (ref 5–15)
BUN: 9 mg/dL (ref 8–23)
CO2: 23 mmol/L (ref 22–32)
Calcium: 9.3 mg/dL (ref 8.9–10.3)
Chloride: 106 mmol/L (ref 98–111)
Creatinine, Ser: 0.73 mg/dL (ref 0.44–1.00)
GFR, Estimated: >60 mL/min (ref >60)
Glucose, Bld: 99 mg/dL (ref 70–99)
Potassium: 3.7 mmol/L (ref 3.5–5.1)
Sodium: 135 mmol/L (ref 135–145)
Total Bilirubin: 2.9 mg/dL — ABNORMAL HIGH (ref 0.3–1.2)
Total Protein: 7.7 g/dL (ref 6.5–8.1)

## 2022-11-02 LAB — CBC WITH DIFFERENTIAL/PLATELET
Abs Immature Granulocytes: 0.03 10*3/uL (ref 0.00–0.07)
Basophils Absolute: 0.1 10*3/uL (ref 0.0–0.1)
Basophils Relative: 1 %
Eosinophils Absolute: 0.7 10*3/uL — ABNORMAL HIGH (ref 0.0–0.5)
Eosinophils Relative: 8 %
HCT: 18.5 % — ABNORMAL LOW (ref 36.0–46.0)
Hemoglobin: 6.6 g/dL — CL (ref 12.0–15.0)
Immature Granulocytes: 0 %
Lymphocytes Relative: 33 %
Lymphs Abs: 3.2 10*3/uL (ref 0.7–4.0)
MCH: 31.6 pg (ref 26.0–34.0)
MCHC: 35.7 g/dL (ref 30.0–36.0)
MCV: 88.5 fL (ref 80.0–100.0)
Monocytes Absolute: 1.2 10*3/uL — ABNORMAL HIGH (ref 0.1–1.0)
Monocytes Relative: 13 %
Neutro Abs: 4.4 10*3/uL (ref 1.7–7.7)
Neutrophils Relative %: 45 %
Platelets: 219 10*3/uL (ref 150–400)
RBC: 2.09 MIL/uL — ABNORMAL LOW (ref 3.87–5.11)
RDW: 21.1 % — ABNORMAL HIGH (ref 11.5–15.5)
WBC: 9.6 10*3/uL (ref 4.0–10.5)
nRBC: 9.3 % — ABNORMAL HIGH (ref 0.0–0.2)

## 2022-11-02 LAB — LACTATE DEHYDROGENASE: LDH: 559 U/L — ABNORMAL HIGH (ref 98–192)

## 2022-11-02 NOTE — Progress Notes (Signed)
  Critical Value: Hgb 6.6  Time and Date notified: 11/02/2022 @ 1415  Provider notified: [place] Hollis, FNP  Action Taken: Per provider, no transfusion warranted.

## 2022-11-02 NOTE — Progress Notes (Signed)
Pt came in today for lab draw. Ordered labs (CBC, CMP, and LDH) drawn peripherally by phlebotomist. Pt advised that if needed she would be contacted regarding labs. Pt is alert, oriented, and ambulatory at discharge.

## 2022-12-03 DIAGNOSIS — M0579 Rheumatoid arthritis with rheumatoid factor of multiple sites without organ or systems involvement: Secondary | ICD-10-CM | POA: Diagnosis not present

## 2022-12-11 ENCOUNTER — Ambulatory Visit (INDEPENDENT_AMBULATORY_CARE_PROVIDER_SITE_OTHER): Payer: 59 | Admitting: Podiatry

## 2022-12-11 DIAGNOSIS — S90221A Contusion of right lesser toe(s) with damage to nail, initial encounter: Secondary | ICD-10-CM

## 2022-12-11 DIAGNOSIS — S90212A Contusion of left great toe with damage to nail, initial encounter: Secondary | ICD-10-CM | POA: Diagnosis not present

## 2022-12-11 NOTE — Progress Notes (Signed)
Contusion bilateral 1st,       Chief Complaint  Patient presents with   Nail Problem    Rm 22  Pt is in office concerned with nail discoloration that happen 2 weeks ago. No pain, no injury, do drainage. No complains of cold feet. Pt has sickle cell.     HPI: 62 y.o. female presenting today with concern of discoloration that occurred 2 weeks ago to several toenails of both feet.  Denies trauma.  Denies wearing too small shoes.  States that she is not had any pain in the area.  She does have a history of sickle cell disease but has not had a crisis recently.  Denies cold temperature to the toes.  Past Medical History:  Diagnosis Date   Aneurysm (HCC)    Anxiety    Arthritis    Colon ulcer    Gall stones    Hypokalemia    Hypothyroidism    Pneumonia    Sickle cell anemia (HCC)    Ulcers of both lower extremities [facility])     Past Surgical History:  Procedure Laterality Date   ANKLE SURGERY  1989 and 1990   CEREBRAL ANEURYSM REPAIR     COLONOSCOPY  05/12/2012   Procedure: COLONOSCOPY;  Surgeon: Hart Carwin, MD;  Location: [facility];  Service: Endoscopy;  Laterality: N/A;   ESOPHAGOGASTRODUODENOSCOPY  05/12/2012   Procedure: ESOPHAGOGASTRODUODENOSCOPY (EGD);  Surgeon: Hart Carwin, MD;  Location: [facility];  Service: Endoscopy;  Laterality: N/A;   gall stone removal     GIVENS CAPSULE STUDY  05/13/2012   Procedure: GIVENS CAPSULE STUDY;  Surgeon: Hart Carwin, MD;  Location: [facility];  Service: Endoscopy;  Laterality: N/A;    Allergies  Allergen Reactions   Ampicillin Other (See Comments)    Caused red bumps on the skin, but nothing else Per patient, she tolerates cephalosporins without problems (??)   Demerol [Meperidine] Other (See Comments)    A high(er) dose caused seizure(s)       Physical Exam:  General: The patient is alert and oriented x3 in no acute distress.  Dermatology: Skin is warm, dry and supple bilateral lower extremities. Interspaces are  clear of maceration and debris.  The bilateral first and second toes, left third toenail, and bilateral fifth toenails black/purple discoloration under the nail.  No distal lysis is noted.  A few nails that have complete discoloration under the nail were trimmed and did reveal some dried blood under the nail.  No drainage is noted  Vascular: Palpable pedal pulses bilaterally. Capillary refill within normal limits.  No appreciable edema.  No erythema or calor.  Proximal to distal cooling is within normal limits bilateral  Neurological: Light touch sensation grossly intact bilateral feet.   Musculoskeletal Exam: No pedal deformities noted  Assessment/Plan of Care: 1. Contusion of left great toe with damage to nail, initial encounter   2. Contusion of toenail of right foot, initial encounter     Discussed clinical findings with patient today. Even though patient does not recall any direct trauma to the toenails that are discolored, informed her that this could have happened in her sleep if she is a restless sleeper.  Also advised patient to check the insides of her shoes to make sure there is no objects wedged down in the toebox area.  Since the nails appear stable and the nails are adhered to the underlying nailbed, she can wait and let the nails grow out on their own and  the discoloration will grow out with the nail. Nail growth should be clear and healthy.  If there are any issues or she does start experiencing any pain with the nail.  She can call to be reevaluated   Clerance Lav, DPM, FACFAS [facility]     [address] [place], [place] [address]                Office [phone]  Fax [phone]

## 2023-02-25 ENCOUNTER — Telehealth: Payer: Self-pay

## 2023-02-25 NOTE — Telephone Encounter (Signed)
T call to advise she is in [facility] and went to the hospital due to feeling bad and weak. Just FYI . Please advise if I need to do anything. KH

## 2023-03-05 ENCOUNTER — Ambulatory Visit: Payer: Self-pay | Admitting: Family Medicine

## 2023-03-15 ENCOUNTER — Other Ambulatory Visit: Payer: Self-pay | Admitting: Family Medicine

## 2023-03-15 ENCOUNTER — Other Ambulatory Visit: Payer: Self-pay | Admitting: Nurse Practitioner

## 2023-03-15 DIAGNOSIS — E079 Disorder of thyroid, unspecified: Secondary | ICD-10-CM

## 2023-03-15 MED ORDER — LEVOTHYROXINE SODIUM 175 MCG PO TABS: 175.0000 ug | ORAL_TABLET | Freq: Every day | ORAL | 0 refills | Status: DC

## 2023-05-08 DIAGNOSIS — R059 Cough, unspecified: Secondary | ICD-10-CM | POA: Diagnosis not present

## 2023-05-08 DIAGNOSIS — K7460 Unspecified cirrhosis of liver: Secondary | ICD-10-CM | POA: Diagnosis not present

## 2023-05-08 DIAGNOSIS — E039 Hypothyroidism, unspecified: Secondary | ICD-10-CM | POA: Diagnosis not present

## 2023-05-08 DIAGNOSIS — R001 Bradycardia, unspecified: Secondary | ICD-10-CM | POA: Diagnosis not present

## 2023-05-08 DIAGNOSIS — D571 Sickle-cell disease without crisis: Secondary | ICD-10-CM | POA: Diagnosis not present

## 2023-05-08 DIAGNOSIS — Z8619 Personal history of other infectious and parasitic diseases: Secondary | ICD-10-CM | POA: Diagnosis not present

## 2023-05-08 DIAGNOSIS — D649 Anemia, unspecified: Secondary | ICD-10-CM | POA: Diagnosis not present

## 2023-05-08 DIAGNOSIS — R5383 Other fatigue: Secondary | ICD-10-CM | POA: Diagnosis not present

## 2023-05-09 DIAGNOSIS — I517 Cardiomegaly: Secondary | ICD-10-CM | POA: Diagnosis not present

## 2023-05-09 DIAGNOSIS — R001 Bradycardia, unspecified: Secondary | ICD-10-CM | POA: Diagnosis not present

## 2023-05-09 DIAGNOSIS — R918 Other nonspecific abnormal finding of lung field: Secondary | ICD-10-CM | POA: Diagnosis not present

## 2023-05-09 DIAGNOSIS — E039 Hypothyroidism, unspecified: Secondary | ICD-10-CM | POA: Diagnosis not present

## 2023-05-09 DIAGNOSIS — R531 Weakness: Secondary | ICD-10-CM | POA: Diagnosis not present

## 2023-05-09 DIAGNOSIS — D571 Sickle-cell disease without crisis: Secondary | ICD-10-CM | POA: Diagnosis not present

## 2023-05-09 DIAGNOSIS — R051 Acute cough: Secondary | ICD-10-CM | POA: Diagnosis not present

## 2023-05-27 DIAGNOSIS — Z008 Encounter for other general examination: Secondary | ICD-10-CM | POA: Diagnosis not present

## 2023-08-20 ENCOUNTER — Telehealth: Payer: Self-pay | Admitting: Family Medicine

## 2023-08-20 NOTE — Telephone Encounter (Signed)
Opened in error

## 2023-08-20 NOTE — Telephone Encounter (Signed)
Pt came in and was very upset, states she was never notified about Lachina leaving the practice. States she has been calling for X1 month trying to get her medications refilled. Pt states she is out of all of her medications.   Scheduled pt an appt with Fola on 2/7, pt agreed to same but wants her medications filled today.   Pharmacy: [facility] [place] [facility] [address]

## 2023-08-30 ENCOUNTER — Other Ambulatory Visit: Payer: Self-pay | Admitting: Nurse Practitioner

## 2023-08-30 ENCOUNTER — Encounter: Payer: Self-pay | Admitting: Nurse Practitioner

## 2023-08-30 ENCOUNTER — Ambulatory Visit (INDEPENDENT_AMBULATORY_CARE_PROVIDER_SITE_OTHER): Payer: 59 | Admitting: Nurse Practitioner

## 2023-08-30 VITALS — BP 114/64 | HR 70 | Temp 98.0°F | Wt 150.4 lb

## 2023-08-30 DIAGNOSIS — D571 Sickle-cell disease without crisis: Secondary | ICD-10-CM

## 2023-08-30 DIAGNOSIS — F419 Anxiety disorder, unspecified: Secondary | ICD-10-CM | POA: Diagnosis not present

## 2023-08-30 DIAGNOSIS — M0579 Rheumatoid arthritis with rheumatoid factor of multiple sites without organ or systems involvement: Secondary | ICD-10-CM

## 2023-08-30 DIAGNOSIS — G629 Polyneuropathy, unspecified: Secondary | ICD-10-CM | POA: Diagnosis not present

## 2023-08-30 DIAGNOSIS — R0602 Shortness of breath: Secondary | ICD-10-CM | POA: Diagnosis not present

## 2023-08-30 DIAGNOSIS — Z1211 Encounter for screening for malignant neoplasm of colon: Secondary | ICD-10-CM

## 2023-08-30 DIAGNOSIS — M62830 Muscle spasm of back: Secondary | ICD-10-CM | POA: Diagnosis not present

## 2023-08-30 DIAGNOSIS — E079 Disorder of thyroid, unspecified: Secondary | ICD-10-CM | POA: Diagnosis not present

## 2023-08-30 DIAGNOSIS — Z23 Encounter for immunization: Secondary | ICD-10-CM | POA: Diagnosis not present

## 2023-08-30 DIAGNOSIS — F32A Depression, unspecified: Secondary | ICD-10-CM

## 2023-08-30 DIAGNOSIS — Z1231 Encounter for screening mammogram for malignant neoplasm of breast: Secondary | ICD-10-CM | POA: Diagnosis not present

## 2023-08-30 MED ORDER — FOLIC ACID 1 MG PO TABS: 1.0000 mg | ORAL_TABLET | Freq: Every day | ORAL | 1 refills | Status: AC

## 2023-08-30 MED ORDER — IBUPROFEN 800 MG PO TABS: 800.0000 mg | ORAL_TABLET | Freq: Three times a day (TID) | ORAL | 2 refills | Status: DC | PRN

## 2023-08-30 MED ORDER — CYCLOBENZAPRINE HCL 10 MG PO TABS: 10.0000 mg | ORAL_TABLET | Freq: Every day | ORAL | 0 refills | Status: DC

## 2023-08-30 MED ORDER — LEVOTHYROXINE SODIUM 175 MCG PO TABS: 175.0000 ug | ORAL_TABLET | Freq: Every day | ORAL | 0 refills | Status: DC

## 2023-08-30 MED ORDER — DULOXETINE HCL 30 MG PO CPEP: 30.0000 mg | ORAL_CAPSULE | Freq: Every day | ORAL | 1 refills | Status: DC

## 2023-08-30 NOTE — Assessment & Plan Note (Signed)
 Followed by rheumatologist at [facility] On infusion of Infliximab  (Avsola )  Patient encouraged to maintain close follow-up with rheumatologist

## 2023-08-30 NOTE — Assessment & Plan Note (Signed)
 Patient educated on CDC recommendation for the vaccine. Verbal consent was obtained from the patient, vaccine administered by nurse, no sign of adverse reactions noted at this time. Patient education on arm soreness and use of tylenol or ibuprofen (if safe) for this patient  was discussed. Patient educated on the signs and symptoms of adverse effect and advise to contact the office if they occur. Vaccine information sheet given to patient.

## 2023-08-30 NOTE — Assessment & Plan Note (Signed)
 Duloxetine  30 mg daily refilled

## 2023-08-30 NOTE — Assessment & Plan Note (Signed)
 Flexeril  10 mg at bedtime as needed refilled

## 2023-08-30 NOTE — Assessment & Plan Note (Signed)
 Checking TSH T4 levels Levothyroxine  175 mcg daily refilled Follow-up in 4 weeks

## 2023-08-30 NOTE — Progress Notes (Addendum)
 Established Patient Office Visit  Subjective:  Patient ID: [name], female    DOB: 05-13-1961  Age: 63 y.o. MRN: [id number]  CC:  Chief Complaint  Patient presents with   Establish Care    HPI [name] is a 63 y.o. female  has a past medical history of Aneurysm (HCC), Anxiety, Arthritis, Colon ulcer, Gall stones, Hypokalemia, Hypothyroidism, Pneumonia, Sickle cell anemia (HCC), and Ulcers of both lower extremities (HCC).  Patient presented establish care for her chronic medical condition.  Previous PCP [place] NP   Shortness of breath patient complains of chronic shortness of breath worse with climbing stairs and when walking fast.  She denies cough, wheezing, edema.  Does not smoke cigarettes  Sickle cell disease.  Currently on folic acid  1 mg daily takes ibuprofen  800 mg 3 times daily as needed pain.  She is interested in referral to hematologist as she is currently not seeing one.  Currently denies fever, chills, chest pain, abdominal pain, nausea, vomiting..  Neuropathy.  Patient complains of burning sensation on her thighs and back.  Takes Flexeril  as needed for muscle spasm but has been out of the medication.   Hypothyroidism.  Currently on levothyroxine  175 mcg daily, stated that she has been out of medication for about 2 weeks.  No complaints of palpitations, fatigue, diarrhea, weight gain     Past Medical History:  Diagnosis Date   Aneurysm (HCC)    Anxiety    Arthritis    Colon ulcer    Gall stones    Hypokalemia    Hypothyroidism    Pneumonia    Sickle cell anemia (HCC)    Ulcers of both lower extremities [facility])     Past Surgical History:  Procedure Laterality Date   ANKLE SURGERY  1989 and 1990   CEREBRAL ANEURYSM REPAIR     COLONOSCOPY  05/12/2012   Procedure: COLONOSCOPY;  Surgeon: Princella CHRISTELLA Nida, MD;  Location: [facility];  Service: Endoscopy;  Laterality: N/A;   ESOPHAGOGASTRODUODENOSCOPY  05/12/2012   Procedure: ESOPHAGOGASTRODUODENOSCOPY  (EGD);  Surgeon: Princella CHRISTELLA Nida, MD;  Location: [facility];  Service: Endoscopy;  Laterality: N/A;   gall stone removal     GIVENS CAPSULE STUDY  05/13/2012   Procedure: GIVENS CAPSULE STUDY;  Surgeon: Princella CHRISTELLA Nida, MD;  Location: [facility];  Service: Endoscopy;  Laterality: N/A;    Family History  Problem Relation Age of Onset   Cancer Mother 48       Esophageal   Sickle cell trait Mother    Sickle cell trait Father    HIV/AIDS Brother    Colon cancer Neg Hx     Social History   Socioeconomic History   Marital status: Single    Spouse name: Not on file   Number of children: Not on file   Years of education: Not on file   Highest education level: Not on file  Occupational History   Not on file  Tobacco Use   Smoking status: Never   Smokeless tobacco: Never  Vaping Use   Vaping status: Never Used  Substance and Sexual Activity   Alcohol use: Yes    Comment: Occasional   Drug use: No   Sexual activity: Not Currently  Other Topics Concern   Not on file  Social History Narrative   Lives at home, 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    Social Drivers of Corporate Investment Banker Strain: Low Risk  (  09/13/2022)   Overall Financial Resource Strain (CARDIA)    Difficulty of Paying Living Expenses: Not hard at all  Food Insecurity: No Food Insecurity (09/27/2023)   Hunger Vital Sign    Worried About Running Out of Food in the Last Year: Never true    Ran Out of Food in the Last Year: Never true  Transportation Needs: No Transportation Needs (09/27/2023)   PRAPARE - Administrator, Civil Service (Medical): No    Lack of Transportation (Non-Medical): No  Physical Activity: Inactive (09/13/2022)   Exercise Vital Sign    Days of Exercise per Week: 0 days    Minutes of Exercise per Session: 0 min  Stress: No Stress Concern Present (09/13/2022)   [employer] of Occupational Health - Occupational Stress Questionnaire    Feeling of Stress :  Not at all  Social Connections: Socially Isolated (09/13/2022)   Social Connection and Isolation Panel [NHANES]    Frequency of Communication with Friends and Family: More than three times a week    Frequency of Social Gatherings with Friends and Family: More than three times a week    Attends Religious Services: Never    Database Administrator or Organizations: No    Attends Banker Meetings: Never    Marital Status: Never married  Intimate Partner Violence: Not At Risk (09/13/2022)   Humiliation, Afraid, Rape, and Kick questionnaire    Fear of Current or Ex-Partner: No    Emotionally Abused: No    Physically Abused: No    Sexually Abused: No    Outpatient Medications Prior to Visit  Medication Sig Dispense Refill   acetaminophen  (TYLENOL ) 650 MG CR tablet Take 650 mg by mouth every 8 (eight) hours as needed for pain.     calcium  citrate (CALCITRATE - DOSED IN MG ELEMENTAL CALCIUM ) 950 (200 Ca) MG tablet Take 200 mg of elemental calcium  by mouth daily.     folic acid  (FOLVITE ) 1 MG tablet Take 1 tablet (1 mg total) by mouth daily. 90 tablet 1   ASCORBIC ACID  PO Take 1 tablet by mouth every morning. (Patient not taking: Reported on 06/12/2022)     CALCIUM  PO Take 500 mg by mouth. (Patient not taking: Reported on 09/27/2023)     Cholecalciferol  (VITAMIN D3 PO) Take 1 tablet by mouth daily.     cyanocobalamin  (VITAMIN B12) 500 MCG tablet Take 500 mcg by mouth daily.     hydrOXYzine  (ATARAX ) 10 MG tablet Take 1 tablet (10 mg total) by mouth 3 (three) times daily as needed. (Patient not taking: Reported on 08/30/2023) 30 tablet 0   meclizine  (ANTIVERT ) 12.5 MG tablet Take 1 tablet (12.5 mg total) by mouth 3 (three) times daily as needed for dizziness. (Patient not taking: Reported on 06/26/2022) 30 tablet 0   cyclobenzaprine  (FLEXERIL ) 10 MG tablet Take 1 tablet (10 mg total) by mouth at bedtime. Ran out (Patient not taking: Reported on 08/30/2023) 30 tablet 2   DULoxetine  (CYMBALTA ) 30  MG capsule Take 1 capsule (30 mg total) by mouth daily. (Patient not taking: Reported on 05/17/2022) 90 capsule 1   Folic Acid  (FOLATE PO) Take 800 mcg by mouth. (Patient not taking: Reported on 09/27/2023)     ibuprofen  (ADVIL ) 800 MG tablet Take 1 tablet (800 mg total) by mouth every 8 (eight) hours as needed. (Patient not taking: Reported on 06/12/2022) 30 tablet 2   levothyroxine  (SYNTHROID ) 175 MCG tablet TAKE 1 TABLET(175 MCG) BY MOUTH DAILY BEFORE  BREAKFAST (Patient not taking: Reported on 08/30/2023) 30 tablet 5   levothyroxine  (SYNTHROID ) 175 MCG tablet Take 1 tablet (175 mcg total) by mouth daily before breakfast. (Patient not taking: Reported on 08/30/2023) 30 tablet 0   omega-3 acid ethyl esters (LOVAZA ) 1 g capsule Take 1 capsule (1 g total) by mouth 2 (two) times daily. (Patient not taking: Reported on 08/30/2023) 60 capsule 5   No facility-administered medications prior to visit.    Allergies  Allergen Reactions   Ampicillin Other (See Comments)    Caused red bumps on the skin, but nothing else Per patient, she tolerates cephalosporins without problems (??)   Demerol [Meperidine] Other (See Comments)    A high(er) dose caused seizure(s)    ROS Review of Systems  Constitutional:  Negative for appetite change, chills, fatigue and fever.  HENT:  Negative for congestion, postnasal drip, rhinorrhea and sneezing.   Respiratory:  Positive for shortness of breath. Negative for cough and wheezing.   Cardiovascular:  Negative for chest pain, palpitations and leg swelling.  Gastrointestinal:  Negative for abdominal pain, constipation, nausea and vomiting.  Genitourinary:  Negative for difficulty urinating, dysuria, flank pain and frequency.  Musculoskeletal:  Negative for arthralgias, back pain, joint swelling and myalgias.  Skin:  Negative for color change, pallor, rash and wound.  Neurological:  Negative for dizziness, facial asymmetry, weakness, numbness and headaches.   Psychiatric/Behavioral:  Negative for behavioral problems, confusion, self-injury and suicidal ideas.       Objective:    Physical Exam Vitals and nursing note reviewed.  Constitutional:      General: She is not in acute distress.    Appearance: Normal appearance. She is not ill-appearing, toxic-appearing or diaphoretic.  HENT:     Mouth/Throat:     Mouth: Mucous membranes are moist.     Pharynx: Oropharynx is clear. No oropharyngeal exudate or posterior oropharyngeal erythema.  Eyes:     General: Scleral icterus present.        Right eye: No discharge.        Left eye: No discharge.     Extraocular Movements: Extraocular movements intact.     Conjunctiva/sclera: Conjunctivae normal.  Cardiovascular:     Rate and Rhythm: Normal rate and regular rhythm.     Pulses: Normal pulses.     Heart sounds: Normal heart sounds. No murmur heard.    No friction rub. No gallop.  Pulmonary:     Effort: Pulmonary effort is normal. No respiratory distress.     Breath sounds: Normal breath sounds. No stridor. No wheezing, rhonchi or rales.  Chest:     Chest wall: No tenderness.  Abdominal:     General: There is no distension.     Palpations: Abdomen is soft.     Tenderness: There is no abdominal tenderness. There is no right CVA tenderness, left CVA tenderness or guarding.  Musculoskeletal:        General: No swelling, tenderness, deformity or signs of injury.     Right lower leg: No edema.     Left lower leg: No edema.  Skin:    General: Skin is warm and dry.     Capillary Refill: Capillary refill takes less than 2 seconds.     Coloration: Skin is not jaundiced or pale.     Findings: No bruising, erythema or lesion.  Neurological:     Mental Status: She is alert and oriented to person, place, and time.     Motor: No weakness.  Coordination: Coordination normal.     Gait: Gait normal.  Psychiatric:        Mood and Affect: Mood normal.        Behavior: Behavior normal.         Thought Content: Thought content normal.        Judgment: Judgment normal.     BP 114/64   Pulse 70   Temp 98 F (36.7 C)   Wt 150 lb 6.4 oz (68.2 kg)   SpO2 94%   BMI 20.98 kg/m  Wt Readings from Last 3 Encounters:  09/27/23 155 lb 12.8 oz (70.7 kg)  08/30/23 150 lb 6.4 oz (68.2 kg)  09/25/22 147 lb 9.6 oz (67 kg)    Lab Results  Component Value Date   TSH 201.000 (H) 08/30/2023   Lab Results  Component Value Date   WBC 10.4 08/30/2023   HGB 6.2 (LL) 08/30/2023   HCT 16.9 (LL) 08/30/2023   MCV 92 08/30/2023   PLT 203 08/30/2023   Lab Results  Component Value Date   NA 139 08/30/2023   K 4.6 08/30/2023   CO2 18 (L) 08/30/2023   GLUCOSE 100 (H) 08/30/2023   BUN 7 (L) 08/30/2023   CREATININE 0.93 08/30/2023   BILITOT 3.7 (H) 08/30/2023   ALKPHOS 99 08/30/2023   AST 73 (H) 08/30/2023   ALT 8 08/30/2023   PROT 7.8 08/30/2023   ALBUMIN 4.0 08/30/2023   CALCIUM  9.4 08/30/2023   ANIONGAP 6 11/02/2022   EGFR 69 08/30/2023   Lab Results  Component Value Date   CHOL 255 (H) 06/26/2022   Lab Results  Component Value Date   HDL 27 (L) 06/26/2022   Lab Results  Component Value Date   LDLCALC 171 (H) 06/26/2022   Lab Results  Component Value Date   TRIG 298 (H) 06/26/2022   Lab Results  Component Value Date   CHOLHDL 9.4 (H) 06/26/2022   No results found for: HGBA1C    Assessment & Plan:   Problem List Items Addressed This Visit       Endocrine   Thyroid  disease   Checking TSH T4 levels Levothyroxine  175 mcg daily refilled Follow-up in 4 weeks      Relevant Medications   levothyroxine  (SYNTHROID ) 175 MCG tablet   Other Relevant Orders   TSH + free T4 (Completed)     Nervous and Auditory   Neuropathy   Duloxetine  30 mg daily refilled        Musculoskeletal and Integument   Rheumatoid arthritis (HCC)   Followed by rheumatologist at [facility] On infusion of Infliximab  ([place] )  Patient encouraged to maintain close follow-up with  rheumatologist        Other   Sickle cell anemia (HCC) - Primary   We discussed the need for good hydration, monitoring of hydration status, avoidance of heat, cold, stress, and infection triggers. We discussed the need to be adherent with taking folic acid  1mg  daily and other home medications. Patient was reminded of the need to seek medical attention immediately if any symptom of bleeding, anemia, or infection occurs.   Patient referred to hematology Continue folic acid  1 mg daily, ibuprofen  800 mg 3 times daily as needed Checking sickle cell panel      Relevant Medications   folic acid  (FOLVITE ) 1 MG tablet   Other Relevant Orders   Sickle Cell Panel (Completed)   Ambulatory referral to Hematology / Oncology   SOB (shortness of breath)   Patient  referred to pulmonology for further evaluation      Relevant Orders   Ambulatory referral to Pulmonology   Need for influenza vaccination   Patient educated on CDC recommendation for the vaccine. Verbal consent was obtained from the patient, vaccine administered by nurse, no sign of adverse reactions noted at this time. Patient education on arm soreness and use of tylenol  or ibuprofen  (if safe) for this patient  was discussed. Patient educated on the signs and symptoms of adverse effect and advise to contact the office if they occur. Vaccine information sheet given to patient.        Relevant Orders   Flu vaccine trivalent PF, 6mos and older(Flulaval,Afluria,Fluarix,Fluzone) (Completed)   Back muscle spasm   Flexeril  10 mg at bedtime as needed refilled      Screening for colon cancer   Relevant Orders   Cologuard   Depression      08/30/2023    2:31 PM 09/13/2022    1:50 PM 09/13/2022    1:49 PM  Depression screen PHQ 2/9  Decreased Interest 3 0 0  Down, Depressed, Hopeless 3 0 0  PHQ - 2 Score 6 0 0  Altered sleeping 0    Tired, decreased energy 3    Change in appetite 0    Feeling bad or failure about yourself  3    Trouble  concentrating 3    Moving slowly or fidgety/restless 3    Suicidal thoughts 0    PHQ-9 Score 18    Difficult doing work/chores Somewhat difficult      States that IM stuck in [place] , originally from [place] ,  and misses her people . Patient denies SI, HI Duloxetine  30 mg daily refilled      Other Visit Diagnoses       Screening mammogram for breast cancer       Relevant Orders   MM 3D SCREENING MAMMOGRAM BILATERAL BREAST       Meds ordered this encounter  Medications   DISCONTD: cyclobenzaprine  (FLEXERIL ) 10 MG tablet    Sig: Take 1 tablet (10 mg total) by mouth at bedtime. Ran out    Dispense:  30 tablet    Refill:  0   levothyroxine  (SYNTHROID ) 175 MCG tablet    Sig: Take 1 tablet (175 mcg total) by mouth daily.    Dispense:  40 tablet    Refill:  0   DISCONTD: ibuprofen  (ADVIL ) 800 MG tablet    Sig: Take 1 tablet (800 mg total) by mouth every 8 (eight) hours as needed.    Dispense:  30 tablet    Refill:  2   DISCONTD: DULoxetine  (CYMBALTA ) 30 MG capsule    Sig: Take 1 capsule (30 mg total) by mouth daily.    Dispense:  90 capsule    Refill:  1   folic acid  (FOLVITE ) 1 MG tablet    Sig: Take 1 tablet (1 mg total) by mouth daily.    Dispense:  90 tablet    Refill:  1    Follow-up: Return in about 4 weeks (around 09/27/2023) for hypothyroidism.    Konstantinos Cordoba R Laruth Hanger, FNP

## 2023-08-30 NOTE — Patient Instructions (Addendum)
 1. Back muscle spasm  - cyclobenzaprine  (FLEXERIL ) 10 MG tablet; Take 1 tablet (10 mg total) by mouth at bedtime. Ran out  Dispense: 30 tablet; Refill: 0  2. Thyroid  disease  - levothyroxine  (SYNTHROID ) 175 MCG tablet; Take 1 tablet (175 mcg total) by mouth daily.  Dispense: 40 tablet; Refill: 0 - TSH + free T4  4  4. Other depression  - DULoxetine  (CYMBALTA ) 30 MG capsule; Take 1 capsule (30 mg total) by mouth daily.  Dispense: 90 capsule; Refill: 1  5. Sickle cell disease without crisis (HCC) (Primary)  - ibuprofen  (ADVIL ) 800 MG tablet; Take 1 tablet (800 mg total) by mouth every 8 (eight) hours as needed.  Dispense: 30 tablet; Refill: 2 - Sickle Cell Panel - Ambulatory referral to Hematology / Oncology  6. Need for influenza vaccination  - Flu vaccine trivalent PF, 6mos and older(Flulaval,Afluria,Fluarix,Fluzone)  7. Screening mammogram for breast cancer  - MM Digital Screening; Future Please call [phone]   to schedule your mammogram.  [facility]. [address] [place], [place] [address]. [place] .    8. Screening for colon cancer  - Cologuard    It is important that you exercise regularly at least 30 minutes 5 times a week as tolerated  Think about what you will eat, plan ahead. Choose  clean, green, fresh or frozen over canned, processed or packaged foods which are more sugary, salty and fatty. 70 to 75% of food eaten should be vegetables and fruit. Three meals at set times with snacks allowed between meals, but they must be fruit or vegetables. Aim to eat over a 12 hour period , example 7 am to 7 pm, and STOP after  your last meal of the day. Drink water,generally about 64 ounces per day, no other drink is as healthy. Fruit juice is best enjoyed in a healthy way, by EATING the fruit.  Thanks for choosing [facility] we consider it a privelige to serve you.

## 2023-08-30 NOTE — Assessment & Plan Note (Addendum)
    08/30/2023    2:31 PM 09/13/2022    1:50 PM 09/13/2022    1:49 PM  Depression screen PHQ 2/9  Decreased Interest 3 0 0  Down, Depressed, Hopeless 3 0 0  PHQ - 2 Score 6 0 0  Altered sleeping 0    Tired, decreased energy 3    Change in appetite 0    Feeling bad or failure about yourself  3    Trouble concentrating 3    Moving slowly or fidgety/restless 3    Suicidal thoughts 0    PHQ-9 Score 18    Difficult doing work/chores Somewhat difficult      States that IM stuck in [place] , originally from [place] ,  and misses her people . Patient denies SI, HI Duloxetine  30 mg daily refilled

## 2023-08-30 NOTE — Assessment & Plan Note (Signed)
 Patient referred to pulmonology for further evaluation

## 2023-08-30 NOTE — Assessment & Plan Note (Addendum)
 We discussed the need for good hydration, monitoring of hydration status, avoidance of heat, cold, stress, and infection triggers. We discussed the need to be adherent with taking folic acid  1mg  daily and other home medications. Patient was reminded of the need to seek medical attention immediately if any symptom of bleeding, anemia, or infection occurs.   Patient referred to hematology Continue folic acid  1 mg daily, ibuprofen  800 mg 3 times daily as needed Checking sickle cell panel

## 2023-09-02 ENCOUNTER — Other Ambulatory Visit: Payer: Self-pay | Admitting: Nurse Practitioner

## 2023-09-02 DIAGNOSIS — E559 Vitamin D deficiency, unspecified: Secondary | ICD-10-CM

## 2023-09-02 LAB — CMP14+CBC/D/PLT+FER+RETIC+V...
ALT: 8 [IU]/L (ref 0–32)
AST: 73 [IU]/L — ABNORMAL HIGH (ref 0–40)
Albumin: 4 g/dL (ref 3.9–4.9)
Alkaline Phosphatase: 99 [IU]/L (ref 44–121)
BUN/Creatinine Ratio: 8 — ABNORMAL LOW (ref 12–28)
BUN: 7 mg/dL — ABNORMAL LOW (ref 8–27)
Basophils Absolute: 0.1 10*3/uL (ref 0.0–0.2)
Basos: 1 %
Bilirubin Total: 3.7 mg/dL — ABNORMAL HIGH (ref 0.0–1.2)
CO2: 18 mmol/L — ABNORMAL LOW (ref 20–29)
Calcium: 9.4 mg/dL (ref 8.7–10.3)
Chloride: 106 mmol/L (ref 96–106)
Creatinine, Ser: 0.93 mg/dL (ref 0.57–1.00)
EOS (ABSOLUTE): 1.1 10*3/uL — ABNORMAL HIGH (ref 0.0–0.4)
Eos: 10 %
Ferritin: 362 ng/mL — ABNORMAL HIGH (ref 15–150)
Globulin, Total: 3.8 g/dL (ref 1.5–4.5)
Glucose: 100 mg/dL — ABNORMAL HIGH (ref 70–99)
Hematocrit: 16.9 % — CL (ref 34.0–46.6)
Hemoglobin: 6.2 g/dL — CL (ref 11.1–15.9)
Immature Grans (Abs): 0.1 10*3/uL (ref 0.0–0.1)
Immature Granulocytes: 1 %
Lymphocytes Absolute: 3.8 10*3/uL — ABNORMAL HIGH (ref 0.7–3.1)
Lymphs: 37 %
MCH: 33.7 pg — ABNORMAL HIGH (ref 26.6–33.0)
MCHC: 36.7 g/dL — ABNORMAL HIGH (ref 31.5–35.7)
MCV: 92 fL (ref 79–97)
Monocytes Absolute: 0.9 10*3/uL (ref 0.1–0.9)
Monocytes: 9 %
NRBC: 25 % — ABNORMAL HIGH (ref 0–0)
Neutrophils Absolute: 4.4 10*3/uL (ref 1.4–7.0)
Neutrophils: 42 %
Platelets: 203 10*3/uL (ref 150–450)
Potassium: 4.6 mmol/L (ref 3.5–5.2)
RBC: 1.84 x10E6/uL — CL (ref 3.77–5.28)
RDW: 20.9 % — ABNORMAL HIGH (ref 11.7–15.4)
Retic Ct Pct: 22.6 % — ABNORMAL HIGH (ref 0.6–2.6)
Sodium: 139 mmol/L (ref 134–144)
Total Protein: 7.8 g/dL (ref 6.0–8.5)
Vit D, 25-Hydroxy: 17.7 ng/mL — ABNORMAL LOW (ref 30.0–100.0)
WBC: 10.4 10*3/uL (ref 3.4–10.8)
eGFR: 69 mL/min/{1.73_m2} (ref >59)

## 2023-09-02 LAB — TSH+FREE T4
Free T4: 0.19 ng/dL — ABNORMAL LOW (ref 0.82–1.77)
TSH: 201 u[IU]/mL — ABNORMAL HIGH (ref 0.450–4.500)

## 2023-09-02 MED ORDER — VITAMIN D (ERGOCALCIFEROL) 1.25 MG (50000 UNIT) PO CAPS: 50000.0000 [IU] | ORAL_CAPSULE | ORAL | 1 refills | Status: DC

## 2023-09-03 ENCOUNTER — Telehealth: Payer: Self-pay | Admitting: Nurse Practitioner

## 2023-09-03 NOTE — Progress Notes (Signed)
Message was left for patient to return call regarding results. KH

## 2023-09-03 NOTE — Telephone Encounter (Signed)
Copied from CRM [id number]. Topic: General - Other >> Sep 03, 2023  2:15 PM Eunice Blase wrote: Reason for CRM: Pt returning call regarding lab results. Please call pt [phone].

## 2023-09-11 ENCOUNTER — Other Ambulatory Visit: Payer: Self-pay

## 2023-09-11 DIAGNOSIS — E7800 Pure hypercholesterolemia, unspecified: Secondary | ICD-10-CM

## 2023-09-11 DIAGNOSIS — M62830 Muscle spasm of back: Secondary | ICD-10-CM

## 2023-09-11 MED ORDER — OMEGA-3-ACID ETHYL ESTERS 1 G PO CAPS: 1.0000 g | ORAL_CAPSULE | Freq: Two times a day (BID) | ORAL | 5 refills | Status: AC

## 2023-09-11 MED ORDER — CYCLOBENZAPRINE HCL 10 MG PO TABS: 10.0000 mg | ORAL_TABLET | Freq: Every day | ORAL | 0 refills | Status: DC

## 2023-09-11 NOTE — Telephone Encounter (Signed)
 Lvm KH

## 2023-09-11 NOTE — Telephone Encounter (Signed)
 Please advise [facility]

## 2023-09-19 ENCOUNTER — Non-Acute Institutional Stay (HOSPITAL_COMMUNITY)
Admission: RE | Admit: 2023-09-19 | Discharge: 2023-09-19 | Disposition: A | Discharge status: Home / Self Care | Payer: 59 | Source: Ambulatory Visit | Attending: Internal Medicine | Admitting: Internal Medicine

## 2023-09-19 VITALS — BP 127/60 | HR 69 | Temp 97.7°F | Resp 16

## 2023-09-19 DIAGNOSIS — D571 Sickle-cell disease without crisis: Secondary | ICD-10-CM | POA: Diagnosis present

## 2023-09-19 LAB — PREPARE RBC (CROSSMATCH)

## 2023-09-19 MED ORDER — DIPHENHYDRAMINE HCL 50 MG/ML IJ SOLN
25.0000 mg | Freq: Once | INTRAMUSCULAR | Status: AC
  Administered 2023-09-19: 25 mg via INTRAVENOUS
  Filled 2023-09-19: qty 1

## 2023-09-19 MED ORDER — SODIUM CHLORIDE 0.9% IV SOLUTION: Freq: Once | INTRAVENOUS | Status: AC

## 2023-09-19 MED ORDER — ACETAMINOPHEN 325 MG PO TABS
650.0000 mg | ORAL_TABLET | Freq: Once | ORAL | Status: AC
  Administered 2023-09-19: 650 mg via ORAL
  Filled 2023-09-19: qty 2

## 2023-09-19 NOTE — Progress Notes (Signed)
 PATIENT CARE CENTER NOTE   Diagnosis: Sickle Cell Disease without crisis   Provider: Edwin Dada, FNP   Procedure: Blood transfusion    Note: Patient received transfusion of 1 unit PRBC's via PIV. Patient pre-medicated with Tylenol 650 mg PO and Benadryl 25 mg IV. Patient tolerated transfusion well with no adverse reaction. Vital signs stable. Discharge instructions given. Patient unable to wait for 1 hour for post-transfusion labs. Patient advised to schedule lab appointment with primary care office within the next week. Patient will schedule lab appointment at the front desk. Patient alert, oriented and ambulatory at discharge.

## 2023-09-20 ENCOUNTER — Other Ambulatory Visit: Payer: Self-pay | Admitting: Nurse Practitioner

## 2023-09-20 DIAGNOSIS — D571 Sickle-cell disease without crisis: Secondary | ICD-10-CM

## 2023-09-20 LAB — BPAM RBC
Blood Product Expiration Date: 202503272359
ISSUE DATE / TIME: 202502271348
Unit Type and Rh: 5100

## 2023-09-20 LAB — TYPE AND SCREEN
ABO/RH(D): B POS
Antibody Screen: NEGATIVE
Unit division: 0

## 2023-09-24 ENCOUNTER — Other Ambulatory Visit: Payer: Self-pay

## 2023-09-26 ENCOUNTER — Ambulatory Visit
Admission: RE | Admit: 2023-09-26 | Discharge: 2023-09-26 | Disposition: A | Discharge status: Home / Self Care | Payer: 59 | Source: Ambulatory Visit | Attending: Nurse Practitioner | Admitting: Nurse Practitioner

## 2023-09-26 DIAGNOSIS — Z1231 Encounter for screening mammogram for malignant neoplasm of breast: Secondary | ICD-10-CM

## 2023-09-27 ENCOUNTER — Encounter: Payer: Self-pay | Admitting: Nurse Practitioner

## 2023-09-27 ENCOUNTER — Ambulatory Visit: Payer: Self-pay | Admitting: Nurse Practitioner

## 2023-09-27 ENCOUNTER — Ambulatory Visit (INDEPENDENT_AMBULATORY_CARE_PROVIDER_SITE_OTHER): Payer: Self-pay | Admitting: Nurse Practitioner

## 2023-09-27 VITALS — BP 125/69 | HR 74 | Temp 98.0°F | Wt 155.8 lb

## 2023-09-27 DIAGNOSIS — R0602 Shortness of breath: Secondary | ICD-10-CM | POA: Diagnosis not present

## 2023-09-27 DIAGNOSIS — E079 Disorder of thyroid, unspecified: Secondary | ICD-10-CM | POA: Diagnosis not present

## 2023-09-27 DIAGNOSIS — D571 Sickle-cell disease without crisis: Secondary | ICD-10-CM | POA: Diagnosis not present

## 2023-09-27 DIAGNOSIS — G629 Polyneuropathy, unspecified: Secondary | ICD-10-CM

## 2023-09-27 DIAGNOSIS — F419 Anxiety disorder, unspecified: Secondary | ICD-10-CM | POA: Diagnosis not present

## 2023-09-27 DIAGNOSIS — F32A Depression, unspecified: Secondary | ICD-10-CM | POA: Insufficient documentation

## 2023-09-27 DIAGNOSIS — F339 Major depressive disorder, recurrent, unspecified: Secondary | ICD-10-CM | POA: Insufficient documentation

## 2023-09-27 MED ORDER — DULOXETINE HCL 30 MG PO CPEP: 30.0000 mg | ORAL_CAPSULE | Freq: Every day | ORAL | 1 refills | Status: DC

## 2023-09-27 MED ORDER — IBUPROFEN 800 MG PO TABS: 800.0000 mg | ORAL_TABLET | Freq: Three times a day (TID) | ORAL | 2 refills | Status: AC | PRN

## 2023-09-27 NOTE — Assessment & Plan Note (Signed)
  Did not pick up prescription for duloxetine Patient encouraged to go to the pharmacy and pick up the prescription Take duloxetine 30 mg daily

## 2023-09-27 NOTE — Assessment & Plan Note (Signed)
 Flowsheet Row Office Visit from 09/27/2023 in [facility]  PHQ-9 Total Score 16         09/27/2023    3:17 PM 08/30/2023    2:33 PM  GAD 7 : Generalized Anxiety Score  Nervous, Anxious, on Edge 1 0  Control/stop worrying 3 3  Worry too much - different things 3 0  Trouble relaxing 3 3  Restless 1 3  Easily annoyed or irritable 3 3  Afraid - awful might happen 1 3  Total GAD 7 Score 15 15  Anxiety Difficulty Somewhat difficult Somewhat difficult   Patient encouraged to pick up the prescription for duloxetine 30 mg daily and take medication as ordered She denies SI, HI

## 2023-09-27 NOTE — Progress Notes (Signed)
 Established Patient Office Visit  Subjective:  Patient ID: [name], female    DOB: [birth year]  Age: 63 y.o. MRN: [id number]  CC:  Chief Complaint  Patient presents with   Follow-up    Patient stated that she is still hurting, back and hips    HPI [name] is a 63 y.o. female  has a past medical history of Aneurysm (HCC), Anxiety, Arthritis, Colon ulcer, Gall stones, Hypokalemia, Hypothyroidism, Pneumonia, Sickle cell anemia (HCC), and Ulcers of both lower extremities (HCC).  Patient presents for follow-up for hypothyroidism  She denies any adverse reactions to current medications Please see assessment and plan section for full HPI     Past Medical History:  Diagnosis Date   Aneurysm (HCC)    Anxiety    Arthritis    Colon ulcer    Gall stones    Hypokalemia    Hypothyroidism    Pneumonia    Sickle cell anemia (HCC)    Ulcers of both lower extremities (HCC)     Past Surgical History:  Procedure Laterality Date   ANKLE SURGERY  1989 and 1990   CEREBRAL ANEURYSM REPAIR     COLONOSCOPY  05/12/2012   Procedure: COLONOSCOPY;  Surgeon: Hart Carwin, MD;  Location: [facility];  Service: Endoscopy;  Laterality: N/A;   ESOPHAGOGASTRODUODENOSCOPY  05/12/2012   Procedure: ESOPHAGOGASTRODUODENOSCOPY (EGD);  Surgeon: Hart Carwin, MD;  Location: [facility];  Service: Endoscopy;  Laterality: N/A;   gall stone removal     GIVENS CAPSULE STUDY  05/13/2012   Procedure: GIVENS CAPSULE STUDY;  Surgeon: Hart Carwin, MD;  Location: [facility];  Service: Endoscopy;  Laterality: N/A;    Family History  Problem Relation Age of Onset   Cancer Mother 70       Esophageal   Sickle cell trait Mother    Sickle cell trait Father    HIV/AIDS Brother    Colon cancer Neg Hx     Social History   Socioeconomic History   Marital status: Single    Spouse name: Not on file   Number of children: Not on file   Years of education: Not on file   Highest education level: Not  on file  Occupational History   Not on file  Tobacco Use   Smoking status: Never   Smokeless tobacco: Never  Vaping Use   Vaping status: Never Used  Substance and Sexual Activity   Alcohol use: Yes    Comment: Occasional   Drug use: No   Sexual activity: Not Currently  Other Topics Concern   Not on file  Social History Narrative   Lives at home, 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    Social Drivers of Corporate investment banker Strain: Low Risk  (09/13/2022)   Overall Financial Resource Strain (CARDIA)    Difficulty of Paying Living Expenses: Not hard at all  Food Insecurity: No Food Insecurity (09/27/2023)   Hunger Vital Sign    Worried About Running Out of Food in the Last Year: Never true    Ran Out of Food in the Last Year: Never true  Transportation Needs: No Transportation Needs (09/27/2023)   PRAPARE - Administrator, Civil Service (Medical): No    Lack of Transportation (Non-Medical): No  Physical Activity: Inactive (09/13/2022)   Exercise Vital Sign    Days of Exercise per Week: 0 days    Minutes of Exercise per  Session: 0 min  Stress: No Stress Concern Present (09/13/2022)   [employer] of Occupational Health - Occupational Stress Questionnaire    Feeling of Stress : Not at all  Social Connections: Socially Isolated (09/13/2022)   Social Connection and Isolation Panel [NHANES]    Frequency of Communication with Friends and Family: More than three times a week    Frequency of Social Gatherings with Friends and Family: More than three times a week    Attends Religious Services: Never    Database administrator or Organizations: No    Attends Banker Meetings: Never    Marital Status: Never married  Intimate Partner Violence: Not At Risk (09/13/2022)   Humiliation, Afraid, Rape, and Kick questionnaire    Fear of Current or Ex-Partner: No    Emotionally Abused: No    Physically Abused: No    Sexually Abused: No     Outpatient Medications Prior to Visit  Medication Sig Dispense Refill   acetaminophen (TYLENOL) 650 MG CR tablet Take 650 mg by mouth every 8 (eight) hours as needed for pain.     albuterol (VENTOLIN HFA) 108 (90 Base) MCG/ACT inhaler SMARTSIG:2 Puff(s) By Mouth Every 4 Hours PRN     calcium citrate (CALCITRATE - DOSED IN MG ELEMENTAL CALCIUM) 950 (200 Ca) MG tablet Take 200 mg of elemental calcium by mouth daily.     Cholecalciferol (VITAMIN D3 PO) Take 1 tablet by mouth daily.     cyanocobalamin (VITAMIN B12) 500 MCG tablet Take 500 mcg by mouth daily.     cyclobenzaprine (FLEXERIL) 10 MG tablet Take 1 tablet (10 mg total) by mouth at bedtime. Ran out 30 tablet 0   folic acid (FOLVITE) 1 MG tablet Take 1 tablet (1 mg total) by mouth daily. 90 tablet 1   levothyroxine (SYNTHROID) 175 MCG tablet Take 1 tablet (175 mcg total) by mouth daily. 40 tablet 0   omega-3 acid ethyl esters (LOVAZA) 1 g capsule Take 1 capsule (1 g total) by mouth 2 (two) times daily. 60 capsule 5   Vitamin D, Ergocalciferol, (DRISDOL) 1.25 MG (50000 UNIT) CAPS capsule Take 1 capsule (50,000 Units total) by mouth every 7 (seven) days. 8 capsule 1   ASCORBIC ACID PO Take 1 tablet by mouth every morning. (Patient not taking: Reported on 06/12/2022)     CALCIUM PO Take 500 mg by mouth. (Patient not taking: Reported on 09/27/2023)     hydrOXYzine (ATARAX) 10 MG tablet Take 1 tablet (10 mg total) by mouth 3 (three) times daily as needed. (Patient not taking: Reported on 08/30/2023) 30 tablet 0   meclizine (ANTIVERT) 12.5 MG tablet Take 1 tablet (12.5 mg total) by mouth 3 (three) times daily as needed for dizziness. (Patient not taking: Reported on 06/26/2022) 30 tablet 0   DULoxetine (CYMBALTA) 30 MG capsule Take 1 capsule (30 mg total) by mouth daily. (Patient not taking: Reported on 09/27/2023) 90 capsule 1   Folic Acid (FOLATE PO) Take 800 mcg by mouth. (Patient not taking: Reported on 09/27/2023)     ibuprofen (ADVIL) 800 MG  tablet Take 1 tablet (800 mg total) by mouth every 8 (eight) hours as needed. (Patient not taking: Reported on 09/27/2023) 30 tablet 2   No facility-administered medications prior to visit.    Allergies  Allergen Reactions   Ampicillin Other (See Comments)    Caused red bumps on the skin, but nothing else Per patient, she tolerates cephalosporins without problems (??)   Demerol [Meperidine]  Other (See Comments)    A high(er) dose caused seizure(s)    ROS Review of Systems  Constitutional:  Negative for appetite change, chills, fatigue and fever.  HENT:  Negative for congestion, postnasal drip, rhinorrhea and sneezing.   Respiratory:  Negative for cough, shortness of breath and wheezing.   Cardiovascular:  Negative for chest pain, palpitations and leg swelling.  Gastrointestinal:  Negative for abdominal pain, constipation, nausea and vomiting.  Genitourinary:  Negative for difficulty urinating, dysuria, flank pain and frequency.  Musculoskeletal:  Positive for arthralgias. Negative for back pain, joint swelling and myalgias.  Skin:  Negative for color change, pallor, rash and wound.  Neurological:  Negative for dizziness, facial asymmetry, weakness, numbness and headaches.  Psychiatric/Behavioral:  Negative for behavioral problems, confusion, self-injury and suicidal ideas.       Objective:    Physical Exam Vitals and nursing note reviewed.  Constitutional:      General: She is not in acute distress.    Appearance: Normal appearance. She is not ill-appearing, toxic-appearing or diaphoretic.  HENT:     Mouth/Throat:     Mouth: Mucous membranes are moist.     Pharynx: Oropharynx is clear. No oropharyngeal exudate or posterior oropharyngeal erythema.  Eyes:     General: Scleral icterus present.        Left eye: No discharge.     Extraocular Movements: Extraocular movements intact.     Conjunctiva/sclera: Conjunctivae normal.  Cardiovascular:     Rate and Rhythm: Normal rate  and regular rhythm.     Pulses: Normal pulses.     Heart sounds: Normal heart sounds. No murmur heard.    No friction rub. No gallop.  Pulmonary:     Effort: Pulmonary effort is normal. No respiratory distress.     Breath sounds: Normal breath sounds. No stridor. No wheezing, rhonchi or rales.  Chest:     Chest wall: No tenderness.  Abdominal:     General: There is no distension.     Palpations: Abdomen is soft.     Tenderness: There is no abdominal tenderness. There is no right CVA tenderness, left CVA tenderness or guarding.  Musculoskeletal:        General: No swelling, deformity or signs of injury.     Right lower leg: No edema.     Left lower leg: No edema.  Skin:    General: Skin is warm and dry.     Capillary Refill: Capillary refill takes less than 2 seconds.     Coloration: Skin is not jaundiced or pale.     Findings: No bruising, erythema or lesion.  Neurological:     Mental Status: She is alert and oriented to person, place, and time.     Motor: No weakness.     Coordination: Coordination normal.     Gait: Gait normal.  Psychiatric:        Mood and Affect: Mood normal.        Behavior: Behavior normal.        Thought Content: Thought content normal.        Judgment: Judgment normal.     BP 125/69   Pulse 74   Temp 98 F (36.7 C) (Oral)   Wt 155 lb 12.8 oz (70.7 kg)   SpO2 100%   BMI 21.73 kg/m  Wt Readings from Last 3 Encounters:  09/27/23 155 lb 12.8 oz (70.7 kg)  08/30/23 150 lb 6.4 oz (68.2 kg)  09/25/22 147 lb 9.6 oz (  67 kg)    Lab Results  Component Value Date   TSH 201.000 (H) 08/30/2023   Lab Results  Component Value Date   WBC 10.4 08/30/2023   HGB 6.2 (LL) 08/30/2023   HCT 16.9 (LL) 08/30/2023   MCV 92 08/30/2023   PLT 203 08/30/2023   Lab Results  Component Value Date   NA 139 08/30/2023   K 4.6 08/30/2023   CO2 18 (L) 08/30/2023   GLUCOSE 100 (H) 08/30/2023   BUN 7 (L) 08/30/2023   CREATININE 0.93 08/30/2023   BILITOT 3.7 (H)  08/30/2023   ALKPHOS 99 08/30/2023   AST 73 (H) 08/30/2023   ALT 8 08/30/2023   PROT 7.8 08/30/2023   ALBUMIN 4.0 08/30/2023   CALCIUM 9.4 08/30/2023   ANIONGAP 6 11/02/2022   EGFR 69 08/30/2023   Lab Results  Component Value Date   CHOL 255 (H) 06/26/2022   Lab Results  Component Value Date   HDL 27 (L) 06/26/2022   Lab Results  Component Value Date   LDLCALC 171 (H) 06/26/2022   Lab Results  Component Value Date   TRIG 298 (H) 06/26/2022   Lab Results  Component Value Date   CHOLHDL 9.4 (H) 06/26/2022   No results found for: "HGBA1C"    Assessment & Plan:   Problem List Items Addressed This Visit       Endocrine   Thyroid disease   On levothyroxine 175 mcg daily Checking TSH T4 levels      Relevant Orders   TSH + free T4     Nervous and Auditory   Neuropathy    Did not pick up prescription for duloxetine Patient encouraged to go to the pharmacy and pick up the prescription Take duloxetine 30 mg daily        Other   Sickle cell anemia (HCC) - Primary   Currently denies fever, chills, chest pain, abdominal pain, nausea, vomiting We discussed the need for good hydration, monitoring of hydration status, avoidance of heat, cold, stress, and infection triggers. We discussed the need to be adherent with taking folic acid 1 mg daily and other home medications. Patient was reminded of the need to seek medical attention immediately if any symptom of bleeding, anemia, or infection occurs.   Patient referred to hematology at [facility], encouraged to get yearly eye exam completed Ibuprofen 800 mg 3 times daily as needed refilled encouraged to alternate with Tylenol 650 mg every 8 hours as needed for pain      Relevant Medications   ibuprofen (ADVIL) 800 MG tablet   Other Relevant Orders   Ambulatory referral to Hematology / Oncology   SOB (shortness of breath)   Still has shortness of breath Referral to pulmonology is pending      Anxiety and  depression   Flowsheet Row Office Visit from 09/27/2023 in [facility]  PHQ-9 Total Score 16         09/27/2023    3:17 PM 08/30/2023    2:33 PM  GAD 7 : Generalized Anxiety Score  Nervous, Anxious, on Edge 1 0  Control/stop worrying 3 3  Worry too much - different things 3 0  Trouble relaxing 3 3  Restless 1 3  Easily annoyed or irritable 3 3  Afraid - awful might happen 1 3  Total GAD 7 Score 15 15  Anxiety Difficulty Somewhat difficult Somewhat difficult   Patient encouraged  to pick up the prescription for duloxetine 30 mg daily and take medication as ordered She denies SI, HI      Relevant Medications   DULoxetine (CYMBALTA) 30 MG capsule    Meds ordered this encounter  Medications   DULoxetine (CYMBALTA) 30 MG capsule    Sig: Take 1 capsule (30 mg total) by mouth daily.    Dispense:  90 capsule    Refill:  1   ibuprofen (ADVIL) 800 MG tablet    Sig: Take 1 tablet (800 mg total) by mouth every 8 (eight) hours as needed.    Dispense:  30 tablet    Refill:  2    Follow-up: Return in about 3 months (around 12/28/2023) for CPE, PAP.    Donell Beers, FNP

## 2023-09-27 NOTE — Patient Instructions (Addendum)
 Please consider getting Shingrix and pneumococcal vaccine at local pharmacy.    1. Sickle cell disease without crisis (HCC) (Primary)  - Ambulatory referral to Hematology / Oncology - ibuprofen (ADVIL) 800 MG tablet; Take 1 tablet (800 mg total) by mouth every 8 (eight) hours as needed.  Dispense: 30 tablet; Refill: 2  2. Anxiety and depression  - DULoxetine (CYMBALTA) 30 MG capsule; Take 1 capsule (30 mg total) by mouth daily.  Dispense: 90 capsule; Refill: 1  3. Thyroid disease  - TSH + free T4     It is important that you exercise regularly at least 30 minutes 5 times a week as tolerated  Think about what you will eat, plan ahead. Choose " clean, green, fresh or frozen" over canned, processed or packaged foods which are more sugary, salty and fatty. 70 to 75% of food eaten should be vegetables and fruit. Three meals at set times with snacks allowed between meals, but they must be fruit or vegetables. Aim to eat over a 12 hour period , example 7 am to 7 pm, and STOP after  your last meal of the day. Drink water,generally about 64 ounces per day, no other drink is as healthy. Fruit juice is best enjoyed in a healthy way, by EATING the fruit.  Thanks for choosing [facility] we consider it a privelige to serve you.

## 2023-09-27 NOTE — Assessment & Plan Note (Signed)
 On levothyroxine 175 mcg daily Checking TSH T4 levels

## 2023-09-27 NOTE — Assessment & Plan Note (Signed)
 Currently denies fever, chills, chest pain, abdominal pain, nausea, vomiting We discussed the need for good hydration, monitoring of hydration status, avoidance of heat, cold, stress, and infection triggers. We discussed the need to be adherent with taking folic acid 1 mg daily and other home medications. Patient was reminded of the need to seek medical attention immediately if any symptom of bleeding, anemia, or infection occurs.   Patient referred to hematology at [facility], encouraged to get yearly eye exam completed Ibuprofen 800 mg 3 times daily as needed refilled encouraged to alternate with Tylenol 650 mg every 8 hours as needed for pain

## 2023-09-27 NOTE — Assessment & Plan Note (Signed)
 Still has shortness of breath Referral to pulmonology is pending

## 2023-09-28 LAB — TSH+FREE T4
Free T4: 0.76 ng/dL — ABNORMAL LOW (ref 0.82–1.77)
TSH: 140 u[IU]/mL — ABNORMAL HIGH (ref 0.450–4.500)

## 2023-09-30 ENCOUNTER — Other Ambulatory Visit: Payer: Self-pay | Admitting: Nurse Practitioner

## 2023-09-30 DIAGNOSIS — E079 Disorder of thyroid, unspecified: Secondary | ICD-10-CM

## 2023-09-30 MED ORDER — LEVOTHYROXINE SODIUM 200 MCG PO TABS: 200.0000 ug | ORAL_TABLET | Freq: Every day | ORAL | 1 refills | Status: DC

## 2023-10-28 ENCOUNTER — Ambulatory Visit: Payer: Self-pay | Admitting: Nurse Practitioner

## 2023-11-11 DIAGNOSIS — M6281 Muscle weakness (generalized): Secondary | ICD-10-CM | POA: Diagnosis not present

## 2023-11-11 DIAGNOSIS — D649 Anemia, unspecified: Secondary | ICD-10-CM | POA: Diagnosis not present

## 2023-11-11 DIAGNOSIS — S79911A Unspecified injury of right hip, initial encounter: Secondary | ICD-10-CM | POA: Diagnosis not present

## 2023-11-11 DIAGNOSIS — N179 Acute kidney failure, unspecified: Secondary | ICD-10-CM | POA: Diagnosis not present

## 2023-11-11 DIAGNOSIS — D62 Acute posthemorrhagic anemia: Secondary | ICD-10-CM | POA: Diagnosis not present

## 2023-11-11 DIAGNOSIS — S79912A Unspecified injury of left hip, initial encounter: Secondary | ICD-10-CM | POA: Diagnosis not present

## 2023-11-11 DIAGNOSIS — M25552 Pain in left hip: Secondary | ICD-10-CM | POA: Diagnosis not present

## 2023-11-11 DIAGNOSIS — M25551 Pain in right hip: Secondary | ICD-10-CM | POA: Diagnosis not present

## 2023-11-12 DIAGNOSIS — D649 Anemia, unspecified: Secondary | ICD-10-CM | POA: Diagnosis not present

## 2023-11-12 DIAGNOSIS — M25552 Pain in left hip: Secondary | ICD-10-CM | POA: Diagnosis not present

## 2023-11-12 DIAGNOSIS — M25551 Pain in right hip: Secondary | ICD-10-CM | POA: Diagnosis not present

## 2023-11-12 DIAGNOSIS — N179 Acute kidney failure, unspecified: Secondary | ICD-10-CM | POA: Diagnosis not present

## 2023-12-27 ENCOUNTER — Ambulatory Visit: Payer: Self-pay | Admitting: Nurse Practitioner

## 2024-02-18 ENCOUNTER — Ambulatory Visit: Payer: Self-pay

## 2024-02-18 NOTE — Telephone Encounter (Signed)
 FYI Only or Action Required?: Action required by provider: request for appointment and going to the Emergency Department to be evaluated.  Patient was last seen in primary care on 09/27/2023 by Paseda, Folashade R, FNP.  Called Nurse Triage reporting Abdominal Pain.  Symptoms began several weeks ago.  Interventions attempted: Rest, hydration, or home remedies.  Symptoms are: unchanged.  Triage Disposition: Go to ED Now (Notify PCP)  Patient/caregiver understands and will follow disposition?: Yes  Copied from CRM [id number]. Topic: Clinical - Red Word Triage >> Feb 18, 2024  2:33 PM Selinda RAMAN wrote: Red Word that prompted transfer to Nurse Triage: The patient called in stating she has had bad abdominal pain since July 4th. She also has had nausea with it. I will transfer her to E2C2 NT Reason for Disposition  [1] SEVERE pain (e.g., excruciating) AND [2] present > 1 hour  Answer Assessment - Initial Assessment Questions 1. LOCATION: Where does it hurt?      Upper abdominal area centered in the middle.  2. RADIATION: Does the pain shoot anywhere else? (e.g., chest, back)     No radiation 3. ONSET: When did the pain begin? (e.g., minutes, hours or days ago)      July 4th  4. SUDDEN: Gradual or sudden onset?     Gradual 5. PATTERN Does the pain come and go, or is it constant?     Comes and goes-pain worsens after eating 6. SEVERITY: How bad is the pain?  (e.g., Scale 1-10; mild, moderate, or severe)     9 7. RECURRENT SYMPTOM: Have you ever had this type of stomach pain before? If Yes, ask: When was the last time? and What happened that time?      Pain has been going on since beginning of July 8. AGGRAVATING FACTORS: Does anything seem to cause this pain? (e.g., foods, stress, alcohol)     Worsening pain after eating 9. CARDIAC SYMPTOMS: Do you have any of the following symptoms: chest pain, difficulty breathing, sweating, nausea?     nausea 10. OTHER SYMPTOMS: Do  you have any other symptoms? (e.g., back pain, diarrhea, fever, urination pain, vomiting)       no  Protocols used: Abdominal Pain - Upper-A-AH

## 2024-03-03 ENCOUNTER — Ambulatory Visit: Payer: Self-pay

## 2024-03-03 NOTE — Telephone Encounter (Signed)
 FYI Only or Action Required?: FYI only for provider.  Patient was last seen in primary care on 09/27/2023 by Paseda, Folashade R, FNP.  Called Nurse Triage reporting Abdominal Pain.  Symptoms began 5.5 weeks ago.  Interventions attempted: OTC medications: Tylenol .  Symptoms are: RLQ abdominal severe abdominal pain intermittent (today has been present since 0800) unchanged.  Triage Disposition: Go to ED Now (Notify PCP)  Patient/caregiver understands and will follow disposition?: Yes           Copied from CRM #[id number]. Topic: Clinical - Red Word Triage >> Mar 03, 2024  2:15 PM [name] wrote: Red Word that prompted transfer to Nurse Triage: patient is having bad stomach cramps after eating or drinking and through out the day its a sharp pain and she has had this concern since 01/24/24  Pt num [phone] Reason for Disposition  [1] SEVERE pain (e.g., excruciating) AND [2] present > 1 hour  Answer Assessment - Initial Assessment Questions 1. LOCATION: Where does it hurt?       RLQ.  2. RADIATION: Does the pain shoot anywhere else? (e.g., chest, back)     No.  3. ONSET: When did the pain begin? (e.g., minutes, hours or days ago)      July 4th 2025.  4. SUDDEN: Gradual or sudden onset?     Gradual.  5. PATTERN Does the pain come and go, or is it constant?     Comes and goes.  6. SEVERITY: How bad is the pain?  (e.g., Scale 1-10; mild, moderate, or severe)     8/10 since 0800 this morning. Patient states she has been taking Tylenol .  7. RECURRENT SYMPTOM: Have you ever had this type of stomach pain before? If Yes, ask: When was the last time? and What happened that time?      No.  8. CAUSE: What do you think is causing the stomach pain? (e.g., gallstones, recent abdominal surgery)     Unsure. Patient states she still has her appendix but does not have a gallbladder. She states she still has her ovaries as well.  9. RELIEVING/AGGRAVATING FACTORS:  What makes it better or worse? (e.g., antacids, bending or twisting motion, bowel movement)     Worse after eating or drinking. She states she just lies down and rests to help the pain.  10. OTHER SYMPTOMS: Do you have any other symptoms? (e.g., back pain, diarrhea, fever, urination pain, vomiting)       Denies nausea, vomiting, constipation, diarrhea, fever, blood in stool, urination pain.  11. PREGNANCY: Is there any chance you are pregnant? When was your last menstrual period?       N/A.  Protocols used: Abdominal Pain - Female-A-AH

## 2024-03-31 ENCOUNTER — Other Ambulatory Visit: Payer: Self-pay | Admitting: Nurse Practitioner

## 2024-03-31 DIAGNOSIS — M62830 Muscle spasm of back: Secondary | ICD-10-CM

## 2024-03-31 MED ORDER — CYCLOBENZAPRINE HCL 10 MG PO TABS: 10.0000 mg | ORAL_TABLET | Freq: Every day | ORAL | 0 refills | Status: DC

## 2024-03-31 NOTE — Telephone Encounter (Signed)
 Copied from CRM [id number]. Topic: Clinical - Medication Refill >> Mar 31, 2024  1:29 PM Delon DASEN wrote: Medication: cyclobenzaprine  (FLEXERIL ) 10 MG tablet  Has the patient contacted their pharmacy? No (Agent: If no, request that the patient contact the pharmacy for the refill. If patient does not wish to contact the pharmacy document the reason why and proceed with request.) (Agent: If yes, when and what did the pharmacy advise?)  This is the patient's preferred pharmacy:  [id number] - [place], [place] - [address] [place] [address] Phone: [phone] Fax: [phone]  Is this the correct pharmacy for this prescription? Yes If no, delete pharmacy and type the correct one.   Has the prescription been filled recently? Yes  Is the patient out of the medication? Yes  Has the patient been seen for an appointment in the last year OR does the patient have an upcoming appointment? Yes  Can we respond through MyChart? Yes  Agent: Please be advised that Rx refills may take up to 3 business days. We ask that you follow-up with your pharmacy.

## 2024-04-01 ENCOUNTER — Encounter: Payer: Self-pay | Admitting: Nurse Practitioner

## 2024-04-01 NOTE — Telephone Encounter (Signed)
 error

## 2024-04-01 NOTE — Telephone Encounter (Signed)
Done kh

## 2024-04-06 ENCOUNTER — Ambulatory Visit: Payer: Self-pay

## 2024-04-06 NOTE — Telephone Encounter (Signed)
 Called back made appt. KH

## 2024-04-06 NOTE — Telephone Encounter (Signed)
 FYI Only or Action Required?: Action required by provider: request for appointment.  Patient was last seen in primary care on 09/27/2023 by Paseda, Folashade R, FNP.  Called Nurse Triage reporting Hip Pain.  Symptoms began several months ago.  Interventions attempted: Nothing.  Symptoms are: gradually worsening.  Triage Disposition: See HCP Within 4 Hours (Or PCP Triage)  Patient/caregiver understands and will follow disposition?: YesCopied from CRM [id number]. Topic: Clinical - Red Word Triage >> Apr 06, 2024  3:43 PM [name] wrote: Red Word that prompted transfer to Nurse Triage:  Left hip pain - pain level is a 9- when she puts weight on it, it feels like it is burning Reason for Disposition  [1] SEVERE pain (e.g., excruciating, unable to do any normal activities) AND [2] not improved after 2 hours of pain medicine  Answer Assessment - Initial Assessment Questions Pt missed call from office to schedule appt. No appts in office or surrounding offices until 9/30. RN advised to seek care at a UC today. Pt still wants office to call and set up appt. Please adivse     1. LOCATION and RADIATION: Where is the pain located? Does the pain spread (shoot) anywhere else?     Left-down leg 2. QUALITY: What does the pain feel like?  (e.g., sharp, dull, aching, burning)     Sharp/burning  3. SEVERITY: How bad is the pain? What does it keep you from doing?   (Scale 1-10; or mild, moderate, severe)     9 4. ONSET: When did the pain start? Does it come and go, or is it there all the time?     July  5. WORK OR EXERCISE: Has there been any recent work or exercise that involved this part of the body?      na 6. CAUSE: What do you think is causing the hip pain?      Fell in 2023 7. AGGRAVATING FACTORS: What makes the hip pain worse? (e.g., walking, climbing stairs, running)     Movement and lying down 8. OTHER SYMPTOMS: Do you have any other symptoms? (e.g., back pain, pain  shooting down leg,  fever, rash)     Pain in leg  Protocols used: Hip Pain-A-AH

## 2024-04-07 DIAGNOSIS — M5416 Radiculopathy, lumbar region: Secondary | ICD-10-CM | POA: Diagnosis not present

## 2024-04-07 DIAGNOSIS — D5700 Hb-SS disease with crisis, unspecified: Secondary | ICD-10-CM | POA: Diagnosis not present

## 2024-04-07 DIAGNOSIS — E039 Hypothyroidism, unspecified: Secondary | ICD-10-CM | POA: Diagnosis not present

## 2024-04-07 DIAGNOSIS — N3289 Other specified disorders of bladder: Secondary | ICD-10-CM | POA: Diagnosis not present

## 2024-04-07 DIAGNOSIS — M25551 Pain in right hip: Secondary | ICD-10-CM | POA: Diagnosis not present

## 2024-04-07 DIAGNOSIS — K5900 Constipation, unspecified: Secondary | ICD-10-CM | POA: Diagnosis not present

## 2024-04-07 DIAGNOSIS — D649 Anemia, unspecified: Secondary | ICD-10-CM | POA: Diagnosis not present

## 2024-04-07 DIAGNOSIS — R110 Nausea: Secondary | ICD-10-CM | POA: Diagnosis not present

## 2024-04-07 DIAGNOSIS — M25552 Pain in left hip: Secondary | ICD-10-CM | POA: Diagnosis not present

## 2024-04-07 DIAGNOSIS — R339 Retention of urine, unspecified: Secondary | ICD-10-CM | POA: Diagnosis not present

## 2024-04-07 DIAGNOSIS — R100 Acute abdomen: Secondary | ICD-10-CM | POA: Diagnosis not present

## 2024-04-08 ENCOUNTER — Ambulatory Visit: Payer: Self-pay | Admitting: Nurse Practitioner

## 2024-04-10 ENCOUNTER — Ambulatory Visit: Payer: Self-pay | Admitting: *Deleted

## 2024-04-10 NOTE — Telephone Encounter (Signed)
 FYI Only or Action Required?: Action required by provider: clinical question for provider, update on patient condition, and requesting recommendations regarding having urinary catheter after hospital discharge and very anxious.  Patient was last seen in primary care on 09/27/2023 by Paseda, Folashade R, FNP.  Called Nurse Triage reporting Anxiety.  Symptoms began today.  Interventions attempted: Other: hospital attempted trial without catheter and unsuccessful.  Symptoms are: anxious , crying.  Triage Disposition: Home Care  Patient/caregiver understands and will follow disposition?: No, wishes to speak with PCP             Copied from CRM [id number]. Topic: Clinical - Red Word Triage >> Apr 10, 2024  1:44 PM Donna BRAVO wrote: Red Word that prompted transfer to Nurse Triage: patient is crying because she doesn't want to pee in a bag, she was peeing fine when she went in. Reason for Disposition  MILD anxiety symptoms (e.g., anxiety symptoms are mild and intermittent; symptoms do not interfere with daily activities)  Answer Assessment - Initial Assessment Questions Recommended patient review concerns for discharge with charge nurse on unit due to patient finding out she is required to have urinary catheter while at home for 2 weeks before f/u with provider. Provided emotional support for patient and to allow patient to process new information regarding discharge. Patient requesting PCP to recommend anything further.      1. CONCERN: Did anything happen that prompted you to call today?      Patient crying and anxious regarding discharge from hospital with urine catheter 2. ANXIETY SYMPTOMS: Can you describe how you (your loved one; patient) have been feeling? (e.g., tense, restless, panicky, anxious, keyed up, overwhelmed, sense of impending doom).      Overwhelmed, anxious  3. ONSET: How long have you been feeling this way? (e.g., hours, days, weeks)     Today found out she  is required to go home with urinary catheter 4. SEVERITY: How would you rate the level of anxiety? (e.g., 0 - 10; or mild, moderate, severe).     Crying but consolable  5. FUNCTIONAL IMPAIRMENT: How have these feelings affected your ability to do daily activities? Have you had more difficulty than usual doing your normal daily activities? (e.g., getting better, same, worse; self-care, school, work, interactions)     Na  6. HISTORY: Have you felt this way before? Have you ever been diagnosed with an anxiety problem in the past? (e.g., generalized anxiety disorder, panic attacks, PTSD). If Yes, ask: How was this problem treated? (e.g., medicines, counseling, etc.)     na 7. RISK OF HARM - SUICIDAL IDEATION: Do you ever have thoughts of hurting or killing yourself? If Yes, ask:  Do you have these feelings now? Do you have a plan on how you would do this?     na 8. TREATMENT:  What has been done so far to treat this anxiety? (e.g., medicines, relaxation strategies). What has helped?     na 9. THERAPIST: Do you have a counselor or therapist? If Yes, ask: What is their name?     na 10. POTENTIAL TRIGGERS: Do you drink caffeinated beverages (e.g., coffee, colas, teas), and how much daily? Do you drink alcohol or use any drugs? Have you started any new medicines recently?       na 11. PATIENT SUPPORT: Who is with you now? Who do you live with? Do you have family or friends who you can talk to?  Patient is still in hospital and will be discharged tomorrow 12. OTHER SYMPTOMS: Do you have any other symptoms? (e.g., feeling depressed, trouble concentrating, trouble sleeping, trouble breathing, palpitations or fast heartbeat, chest pain, sweating, nausea, or diarrhea)       Anxious, crying 13. PREGNANCY: Is there any chance you are pregnant? When was your last menstrual period?       na  Protocols used: Anxiety and Panic Attack-A-AH

## 2024-04-13 ENCOUNTER — Other Ambulatory Visit: Payer: Self-pay

## 2024-04-13 DIAGNOSIS — E079 Disorder of thyroid, unspecified: Secondary | ICD-10-CM

## 2024-04-13 MED ORDER — LEVOTHYROXINE SODIUM 200 MCG PO TABS: 200.0000 ug | ORAL_TABLET | Freq: Every day | ORAL | 1 refills | Status: DC

## 2024-04-13 NOTE — Telephone Encounter (Signed)
Called pt no answer lvm. KH 

## 2024-04-14 ENCOUNTER — Telehealth: Payer: Self-pay

## 2024-04-14 NOTE — Transitions of Care (Post Inpatient/ED Visit) (Signed)
   04/14/2024  Name: [name] MRN: [id number] DOB: 19-Aug-1960  Today's TOC FU Call Status: Today's TOC FU Call Status:: Unsuccessful Call (1st Attempt) Unsuccessful Call (1st Attempt) Date: 04/14/24  Attempted to reach the patient regarding the most recent Inpatient/ED visit.  Follow Up Plan: Additional outreach attempts will be made to reach the patient to complete the Transitions of Care (Post Inpatient/ED visit) call.   Arvin Seip RN, BSN, CCM [employer], Population Health Case Manager Phone: [phone]

## 2024-04-15 ENCOUNTER — Telehealth: Payer: Self-pay

## 2024-04-15 NOTE — Transitions of Care (Post Inpatient/ED Visit) (Signed)
   04/15/2024  Name: [name] MRN: [id number] DOB: 1961-06-12  Today's TOC FU Call Status: Today's TOC FU Call Status:: Unsuccessful Call (2nd Attempt) Unsuccessful Call (2nd Attempt) Date: 04/15/24  Attempted to reach the patient regarding the most recent Inpatient/ED visit.  Follow Up Plan: Additional outreach attempts will be made to reach the patient to complete the Transitions of Care (Post Inpatient/ED visit) call.   Alan Ee, RN, BSN, CEN [employer]- Transition of Care Team.  [facility] [phone]

## 2024-04-16 ENCOUNTER — Telehealth: Payer: Self-pay

## 2024-04-16 NOTE — Transitions of Care (Post Inpatient/ED Visit) (Signed)
   04/16/2024  Name: [name] MRN: [id number] DOB: May 02, 1961  Today's TOC FU Call Status: Today's TOC FU Call Status:: Unsuccessful Call (3rd Attempt) Unsuccessful Call (3rd Attempt) Date: 04/16/24  Attempted to reach the patient regarding the most recent Inpatient/ED visit.  Follow Up Plan: No further outreach attempts will be made at this time. We have been unable to contact the patient.  Alan Ee, RN, BSN, CEN [employer]- Transition of Care Team.  [facility] [phone]

## 2024-04-17 ENCOUNTER — Encounter: Payer: Self-pay | Admitting: Nurse Practitioner

## 2024-04-17 ENCOUNTER — Ambulatory Visit (INDEPENDENT_AMBULATORY_CARE_PROVIDER_SITE_OTHER): Admitting: Nurse Practitioner

## 2024-04-17 VITALS — BP 126/53 | HR 65 | Wt 145.0 lb

## 2024-04-17 DIAGNOSIS — E559 Vitamin D deficiency, unspecified: Secondary | ICD-10-CM

## 2024-04-17 DIAGNOSIS — F419 Anxiety disorder, unspecified: Secondary | ICD-10-CM

## 2024-04-17 DIAGNOSIS — R339 Retention of urine, unspecified: Secondary | ICD-10-CM | POA: Diagnosis not present

## 2024-04-17 DIAGNOSIS — M25551 Pain in right hip: Secondary | ICD-10-CM

## 2024-04-17 DIAGNOSIS — D571 Sickle-cell disease without crisis: Secondary | ICD-10-CM

## 2024-04-17 DIAGNOSIS — M62830 Muscle spasm of back: Secondary | ICD-10-CM

## 2024-04-17 DIAGNOSIS — F32A Depression, unspecified: Secondary | ICD-10-CM

## 2024-04-17 DIAGNOSIS — E079 Disorder of thyroid, unspecified: Secondary | ICD-10-CM

## 2024-04-17 DIAGNOSIS — Z09 Encounter for follow-up examination after completed treatment for conditions other than malignant neoplasm: Secondary | ICD-10-CM

## 2024-04-17 DIAGNOSIS — G8929 Other chronic pain: Secondary | ICD-10-CM

## 2024-04-17 DIAGNOSIS — E876 Hypokalemia: Secondary | ICD-10-CM

## 2024-04-17 DIAGNOSIS — M25552 Pain in left hip: Secondary | ICD-10-CM

## 2024-04-17 DIAGNOSIS — M0579 Rheumatoid arthritis with rheumatoid factor of multiple sites without organ or systems involvement: Secondary | ICD-10-CM

## 2024-04-17 MED ORDER — DULOXETINE HCL 30 MG PO CPEP
30.0000 mg | ORAL_CAPSULE | Freq: Every day | ORAL | 1 refills | Status: AC
Start: 2024-04-17 — End: ?

## 2024-04-17 MED ORDER — TIZANIDINE HCL 2 MG PO CAPS: 2.0000 mg | ORAL_CAPSULE | Freq: Three times a day (TID) | ORAL | 1 refills | Status: DC | PRN

## 2024-04-17 NOTE — Assessment & Plan Note (Signed)
 Hospital chart reviewed, including discharge summary Medications reconciled and reviewed with the patient in detail

## 2024-04-17 NOTE — Assessment & Plan Note (Signed)
 Was prescribed potassium 10 mEq twice daily at discharge but has not been taking the medication Courage to take medication as ordered

## 2024-04-17 NOTE — Assessment & Plan Note (Signed)
 Urinary retention with indwelling Foley catheter Urinary retention managed with Foley catheter causing irritation. Upcoming urology appointment on October 3rd. - Advise to keep Foley catheter until urology appointment. - Ensure adherence to tamsulosin 0.4 mg daily. - Encourage hydration with at least 64 ounces of water daily. - Advise on proper hygiene to prevent urinary tract infections.

## 2024-04-17 NOTE — Patient Instructions (Signed)
.   Back muscle spasm  - tizanidine  (ZANAFLEX ) 2 MG capsule; Take 1 capsule (2 mg total) by mouth 3 (three) times daily as needed for muscle spasms.  Dispense: 30 capsule; Refill: 1    . Anxiety and depression  - DULoxetine  (CYMBALTA ) 30 MG capsule; Take 1 capsule (30 mg total) by mouth daily.  Dispense: 90 capsule; Refill: 1  It is important that you exercise regularly at least 30 minutes 5 times a week as tolerated  Think about what you will eat, plan ahead. Choose  clean, green, fresh or frozen over canned, processed or packaged foods which are more sugary, salty and fatty. 70 to 75% of food eaten should be vegetables and fruit. Three meals at set times with snacks allowed between meals, but they must be fruit or vegetables. Aim to eat over a 12 hour period , example 7 am to 7 pm, and STOP after  your last meal of the day. Drink water,generally about 64 ounces per day, no other drink is as healthy. Fruit juice is best enjoyed in a healthy way, by EATING the fruit.  Thanks for choosing [facility] we consider it a privelige to serve you.

## 2024-04-17 NOTE — Assessment & Plan Note (Signed)
 Taking OTC vitamin D  Last vitamin D  Lab Results  Component Value Date   VD25OH 17.7 (L) 08/30/2023

## 2024-04-17 NOTE — Progress Notes (Signed)
 Established Patient Office Visit  Subjective:  Patient ID: [name], female    DOB: [birth year]  Age: 63 y.o. MRN: [id number]  CC:  Chief Complaint  Patient presents with   Hospitalization Follow-up    HPI   Discussed the use of AI scribe software for clinical note transcription with the patient, who gave verbal consent to proceed.  History of Present Illness [name] is a 63 year old female  has a past medical history of Aneurysm, Anxiety, Arthritis, Colon ulcer, Gall stones, Hypokalemia, Hypothyroidism, Pneumonia, Sickle cell anemia (HCC), and Ulcers of both lower extremities (HCC).  urinary retention  who presents for hospitalization follow-up.   She was recently hospitalized from September 17 to April 14, 2024, at [facility]  due complaints of abdominal pain. 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  She was discharged with a Foley catheter due to urinary retention. She experiences irritation and discomfort from the catheter, and her urology follow-up is scheduled for April 24, 2024.She has been prescribed tamsulosin 0.4 mg daily to aid urination but has not been taking it ,  Has chronic bilateral hip pain CTAP without evidence of avascular necrosis has sclerotic left iliac lesion favored bone island, recommended to continue analgesic and be referred to outpatient physical therapy.  She takes cyclobenzaprine  as needed but has been out of the medication, also takes ibuprofen  800 mg 3 times daily as needed  Hypothyroidism, TSH was significantly elevated and T4 significantly low levothyroxine  was increased to 200 mcg daily  Anxiety and depression, was prescribed duloxetine  30 mg daily but not taking the  medication  Her medication regimen also includes calcium , vitamin D , folic acid , omega-3 acid, famotidine  as needed, and potassium chloride , which was started after her recent hospitalization. She takes a muscle relaxer and has been prescribed duloxetine , which she has not been taking due to needing a refill.  She has a history of sickle cell disease and experiences pain related to this condition.  Takes folic acid  daily, she takes ibuprofen  800 mg every eight hours as needed for pain.   Has history of rheumatoid arthritis she reports lumps and swelling. She reports challenges with transportation, which has affected her ability to attend medical appointments,  including those with her rheumatologist for rheumatoid arthritis. She lives in [facility] and has difficulty accessing reliable transportation services.Medicaid provides transportation but this sometimes are not prompt  No fever, chills, chest pain, or shortness of breath. She reports abdominal and hip pain, and swelling associated with arthritis.  Assessment and Plan Assessment & Plan     Past Medical History:  Diagnosis Date   Aneurysm    Anxiety    Arthritis    Colon ulcer    Gall stones    Hypokalemia    Hypothyroidism    Pneumonia    Sickle cell anemia (HCC)    Ulcers of both lower extremities [facility])     Past Surgical History:  Procedure Laterality Date   ANKLE SURGERY  1989 and 1990   CEREBRAL ANEURYSM REPAIR     COLONOSCOPY  05/12/2012   Procedure: COLONOSCOPY;  Surgeon: Princella CHRISTELLA Nida, MD;  Location: [facility];  Service: Endoscopy;  Laterality: N/A;   ESOPHAGOGASTRODUODENOSCOPY  05/12/2012  Procedure: ESOPHAGOGASTRODUODENOSCOPY (EGD);  Surgeon: Princella CHRISTELLA Nida, MD;  Location: [facility];  Service: Endoscopy;  Laterality: N/A;   gall stone removal     GIVENS CAPSULE STUDY  05/13/2012   Procedure: GIVENS CAPSULE STUDY;  Surgeon: Princella CHRISTELLA Nida, MD;  Location: [facility];  Service: Endoscopy;  Laterality: N/A;     Family History  Problem Relation Age of Onset   Cancer Mother 56       Esophageal   Sickle cell trait Mother    Sickle cell trait Father    HIV/AIDS Brother    Colon cancer Neg Hx     Social History   Socioeconomic History   Marital status: Single    Spouse name: Not on file   Number of children: Not on file   Years of education: Not on file   Highest education level: Not on file  Occupational History   Not on file  Tobacco Use   Smoking status: Never   Smokeless tobacco: Never  Vaping Use   Vaping status: Never Used  Substance and Sexual Activity   Alcohol use: Yes    Comment: Occasional   Drug use: No   Sexual activity: Not Currently  Other Topics Concern   Not on file  Social History Narrative   Lives at home, 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    Social Drivers of Corporate investment banker Strain: Low Risk  (09/13/2022)   Overall Financial Resource Strain (CARDIA)    Difficulty of Paying Living Expenses: Not hard at all  Food Insecurity: Low Risk  (04/08/2024)   Received from [facility]   Hunger Vital Sign    Within the past 12 months, you worried that your food would run out before you got money to buy more: Never true    Within the past 12 months, the food you bought just didn't last and you didn't have money to get more. : Never true  Transportation Needs: Unmet Transportation Needs (04/08/2024)   Received from [facility]    In the past 12 months, has lack of reliable transportation kept you from medical appointments, meetings, work or from getting things needed for daily living? : Yes  Physical Activity: Inactive (09/13/2022)   Exercise Vital Sign    Days of Exercise per Week: 0 days    Minutes of Exercise per Session: 0 min  Stress: No Stress Concern Present (09/13/2022)   [employer] of Occupational Health - Occupational Stress Questionnaire    Feeling of Stress : Not at all  Social Connections:  Socially Isolated (09/13/2022)   Social Connection and Isolation Panel    Frequency of Communication with Friends and Family: More than three times a week    Frequency of Social Gatherings with Friends and Family: More than three times a week    Attends Religious Services: Never    Database administrator or Organizations: No    Attends Banker Meetings: Never    Marital Status: Never married  Intimate Partner Violence: Not At Risk (09/13/2022)   Humiliation, Afraid, Rape, and Kick questionnaire    Fear of Current or Ex-Partner: No    Emotionally Abused: No    Physically Abused: No    Sexually Abused: No    Outpatient Medications Prior to Visit  Medication Sig Dispense Refill   albuterol  (VENTOLIN  HFA) 108 (90 Base) MCG/ACT inhaler SMARTSIG:2 Puff(s) By Mouth Every 4 Hours PRN  calcium  citrate (CALCITRATE - DOSED IN MG ELEMENTAL CALCIUM ) 950 (200 Ca) MG tablet Take 200 mg of elemental calcium  by mouth daily.     Cholecalciferol  (VITAMIN D3 PO) Take 1 tablet by mouth daily.     cyanocobalamin  (VITAMIN B12) 500 MCG tablet Take 500 mcg by mouth daily.     famotidine  (PEPCID ) 20 MG tablet Take 20 mg by mouth 2 (two) times daily. (Patient taking differently: Take 20 mg by mouth 2 (two) times daily.)     folic acid  (FOLVITE ) 1 MG tablet Take 1 tablet (1 mg total) by mouth daily. 90 tablet 1   ibuprofen  (ADVIL ) 800 MG tablet Take 1 tablet (800 mg total) by mouth every 8 (eight) hours as needed. 30 tablet 2   levothyroxine  (SYNTHROID ) 200 MCG tablet Take 1 tablet (200 mcg total) by mouth daily. 30 tablet 1   omega-3 acid ethyl esters (LOVAZA ) 1 g capsule Take 1 capsule (1 g total) by mouth 2 (two) times daily. 60 capsule 5   oxyCODONE  (OXY IR/ROXICODONE ) 5 MG immediate release tablet Take 5 mg by mouth every 4 (four) hours as needed for severe pain (pain score 7-10).     potassium chloride  (MICRO-K ) 10 MEQ CR capsule Take 10 mEq by mouth 2 (two) times daily.     senna-docusate  (SENOKOT-S) 8.6-50 MG tablet Take 1 tablet by mouth daily.     tamsulosin (FLOMAX) 0.4 MG CAPS capsule Take 0.4 mg by mouth.     acetaminophen  (TYLENOL ) 650 MG CR tablet Take 650 mg by mouth every 8 (eight) hours as needed for pain. (Patient not taking: Reported on 04/17/2024)     ASCORBIC ACID  PO Take 1 tablet by mouth every morning. (Patient not taking: Reported on 04/17/2024)     CALCIUM  PO Take 500 mg by mouth. (Patient not taking: Reported on 04/17/2024)     cyclobenzaprine  (FLEXERIL ) 10 MG tablet Take 1 tablet (10 mg total) by mouth at bedtime. Ran out 30 tablet 0   Vitamin D , Ergocalciferol , (DRISDOL ) 1.25 MG (50000 UNIT) CAPS capsule Take 1 capsule (50,000 Units total) by mouth every 7 (seven) days. (Patient not taking: Reported on 04/17/2024) 8 capsule 1   DULoxetine  (CYMBALTA ) 30 MG capsule Take 1 capsule (30 mg total) by mouth daily. (Patient not taking: Reported on 04/17/2024) 90 capsule 1   hydrOXYzine  (ATARAX ) 10 MG tablet Take 1 tablet (10 mg total) by mouth 3 (three) times daily as needed. (Patient not taking: Reported on 04/17/2024) 30 tablet 0   meclizine  (ANTIVERT ) 12.5 MG tablet Take 1 tablet (12.5 mg total) by mouth 3 (three) times daily as needed for dizziness. (Patient not taking: Reported on 06/26/2022) 30 tablet 0   No facility-administered medications prior to visit.    Allergies  Allergen Reactions   Ampicillin Other (See Comments)    Caused red bumps on the skin, but nothing else Per patient, she tolerates cephalosporins without problems (??)   Demerol [Meperidine] Other (See Comments)    A high(er) dose caused seizure(s)    ROS Review of Systems  Constitutional:  Negative for appetite change, chills, fatigue and fever.  HENT:  Negative for congestion, postnasal drip, rhinorrhea and sneezing.   Respiratory:  Negative for cough, shortness of breath and wheezing.   Cardiovascular:  Negative for chest pain, palpitations and leg swelling.  Gastrointestinal:  Negative for  abdominal pain, constipation, nausea and vomiting.  Genitourinary:  Negative for difficulty urinating, dysuria, flank pain and frequency.  Musculoskeletal:  Positive for arthralgias.  Negative for joint swelling and myalgias.  Skin:  Negative for color change, pallor, rash and wound.  Neurological:  Negative for weakness, numbness and headaches.  Psychiatric/Behavioral:  Negative for behavioral problems, confusion, self-injury and suicidal ideas.       Objective:    Physical Exam Vitals and nursing note reviewed.  Constitutional:      General: She is not in acute distress.    Appearance: Normal appearance. She is not ill-appearing, toxic-appearing or diaphoretic.  Eyes:     General: No scleral icterus.       Right eye: No discharge.        Left eye: No discharge.     Extraocular Movements: Extraocular movements intact.  Cardiovascular:     Rate and Rhythm: Normal rate and regular rhythm.     Pulses: Normal pulses.     Heart sounds: Normal heart sounds. No murmur heard.    No friction rub. No gallop.  Pulmonary:     Effort: Pulmonary effort is normal. No respiratory distress.     Breath sounds: Normal breath sounds. No stridor. No wheezing, rhonchi or rales.  Chest:     Chest wall: No tenderness.  Abdominal:     General: There is no distension.     Palpations: Abdomen is soft.     Tenderness: There is no abdominal tenderness. There is no right CVA tenderness, left CVA tenderness or guarding.  Musculoskeletal:        General: Tenderness present. No swelling, deformity or signs of injury.     Right lower leg: No edema.     Left lower leg: No edema.     Comments: Decreased ROM of the spine and hips  RA nodules   Skin:    General: Skin is warm and dry.     Capillary Refill: Capillary refill takes less than 2 seconds.     Coloration: Skin is not jaundiced or pale.     Findings: No bruising, erythema or lesion.  Neurological:     Mental Status: She is alert and oriented to  person, place, and time.     Motor: No weakness.     Coordination: Coordination normal.     Gait: Gait normal.  Psychiatric:        Mood and Affect: Mood normal.        Behavior: Behavior normal.        Thought Content: Thought content normal.        Judgment: Judgment normal.     BP (!) 126/53   Pulse 65   Wt 145 lb (65.8 kg)   SpO2 100%   BMI 20.22 kg/m  Wt Readings from Last 3 Encounters:  04/17/24 145 lb (65.8 kg)  09/27/23 155 lb 12.8 oz (70.7 kg)  08/30/23 150 lb 6.4 oz (68.2 kg)    Lab Results  Component Value Date   TSH 140.000 (H) 09/27/2023   Lab Results  Component Value Date   WBC 10.4 08/30/2023   HGB 6.2 (LL) 08/30/2023   HCT 16.9 (LL) 08/30/2023   MCV 92 08/30/2023   PLT 203 08/30/2023   Lab Results  Component Value Date   NA 139 08/30/2023   K 4.6 08/30/2023   CO2 18 (L) 08/30/2023   GLUCOSE 100 (H) 08/30/2023   BUN 7 (L) 08/30/2023   CREATININE 0.93 08/30/2023   BILITOT 3.7 (H) 08/30/2023   ALKPHOS 99 08/30/2023   AST 73 (H) 08/30/2023   ALT 8 08/30/2023   PROT 7.8  08/30/2023   ALBUMIN 4.0 08/30/2023   CALCIUM  9.4 08/30/2023   ANIONGAP 6 11/02/2022   EGFR 69 08/30/2023   Lab Results  Component Value Date   CHOL 255 (H) 06/26/2022   Lab Results  Component Value Date   HDL 27 (L) 06/26/2022   Lab Results  Component Value Date   LDLCALC 171 (H) 06/26/2022   Lab Results  Component Value Date   TRIG 298 (H) 06/26/2022   Lab Results  Component Value Date   CHOLHDL 9.4 (H) 06/26/2022   No results found for: HGBA1C    Assessment & Plan:   Problem List Items Addressed This Visit       Endocrine   Thyroid  disease    Hypothyroidism managed with levothyroxine  200 mcg daily. - Schedule follow-up in four weeks for thyroid  function test. - Ensure adherence to levothyroxine  regimen, taking on an empty stomach.        Musculoskeletal and Integument   Rheumatoid arthritis (HCC)   Rheumatoid arthritis Rheumatoid arthritis  with severe pain and swelling. Was on Infliximab  infusion Discontinued rheumatology follow-up due to transportation issues. Information for SCAT       Relevant Medications   oxyCODONE  (OXY IR/ROXICODONE ) 5 MG immediate release tablet   tizanidine  (ZANAFLEX ) 2 MG capsule     Genitourinary   Urinary retention   Urinary retention with indwelling Foley catheter Urinary retention managed with Foley catheter causing irritation. Upcoming urology appointment on October 3rd. - Advise to keep Foley catheter until urology appointment. - Ensure adherence to tamsulosin 0.4 mg daily. - Encourage hydration with at least 64 ounces of water daily. - Advise on proper hygiene to prevent urinary tract infections.         Other   Sickle cell anemia (HCC) - Primary   Sickle cell disease with recurrent pain episodes Sickle cell disease with recurrent pain, recent hospitalization for pain crisis. Not under hematologist care due to transportation issues. Continue folic acid  daily, ibuprofen  800 mg 3 times daily as needed pain, oxycodone  as needed - Encourage avoidance of infection triggers, extreme temperature changes, and stress. - Ensure adherence to prescribed medications, including folic acid  and pain management. - Information for SCAT provided       Relevant Orders   Sickle Cell Panel   Back muscle spasm   Relevant Medications   tizanidine  (ZANAFLEX ) 2 MG capsule   Anxiety and depression   Relevant Medications   DULoxetine  (CYMBALTA ) 30 MG capsule   Chronic hip pain   Chronic hip pain - Switched Flexeril  to tizanidine  2 mg every 8 hours as needed. - Advise against concurrent use of tizanidine  and oxycodone . We discussed referral to physical therapy at next visit       Relevant Medications   oxyCODONE  (OXY IR/ROXICODONE ) 5 MG immediate release tablet   tizanidine  (ZANAFLEX ) 2 MG capsule   DULoxetine  (CYMBALTA ) 30 MG capsule   Hospital discharge follow-up   Hospital chart reviewed,  including discharge summary Medications reconciled and reviewed with the patient in detail       Hypokalemia   Was prescribed potassium 10 mEq twice daily at discharge but has not been taking the medication Courage to take medication as ordered      Vitamin D  deficiency   Taking OTC vitamin D  Last vitamin D  Lab Results  Component Value Date   VD25OH 17.7 (L) 08/30/2023          Meds ordered this encounter  Medications   tizanidine  (ZANAFLEX ) 2 MG capsule  Sig: Take 1 capsule (2 mg total) by mouth 3 (three) times daily as needed for muscle spasms.    Dispense:  30 capsule    Refill:  1   DULoxetine  (CYMBALTA ) 30 MG capsule    Sig: Take 1 capsule (30 mg total) by mouth daily.    Dispense:  90 capsule    Refill:  1    Follow-up: Return in about 4 weeks (around 05/15/2024), or hypothyroidism.    Nathaly Dawkins R Giavonna Pflum, FNP

## 2024-04-17 NOTE — Assessment & Plan Note (Signed)
 Chronic hip pain - Switched Flexeril  to tizanidine  2 mg every 8 hours as needed. - Advise against concurrent use of tizanidine  and oxycodone . We discussed referral to physical therapy at next visit

## 2024-04-17 NOTE — Assessment & Plan Note (Signed)
  Hypothyroidism managed with levothyroxine  200 mcg daily. - Schedule follow-up in four weeks for thyroid  function test. - Ensure adherence to levothyroxine  regimen, taking on an empty stomach.

## 2024-04-17 NOTE — Assessment & Plan Note (Signed)
 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 Not under hematologist care due to transportation issues. Continue folic acid  daily, ibuprofen  800 mg 3 times daily as needed pain, oxycodone  as needed - Encourage avoidance of infection triggers, extreme temperature changes, and stress. - Ensure adherence to prescribed medications, including folic acid  and pain management. - Information for SCAT provided

## 2024-04-17 NOTE — Assessment & Plan Note (Signed)
 Rheumatoid arthritis Rheumatoid arthritis with severe pain and swelling. Was on Infliximab  infusion Discontinued rheumatology follow-up due to transportation issues. Information for SC[facility]

## 2024-04-18 LAB — CMP14+CBC/D/PLT+FER+RETIC+V...
ALT: 13 IU/L (ref 0–32)
AST: 79 IU/L — ABNORMAL HIGH (ref 0–40)
Albumin: 4.1 g/dL (ref 3.9–4.9)
Alkaline Phosphatase: 102 IU/L (ref 49–135)
BUN/Creatinine Ratio: 7 — ABNORMAL LOW (ref 12–28)
BUN: 5 mg/dL — ABNORMAL LOW (ref 8–27)
Basophils Absolute: 0.1 x10E3/uL (ref 0.0–0.2)
Basos: 2 %
Bilirubin Total: 3.5 mg/dL — ABNORMAL HIGH (ref 0.0–1.2)
CO2: 21 mmol/L (ref 20–29)
Calcium: 9.4 mg/dL (ref 8.7–10.3)
Chloride: 103 mmol/L (ref 96–106)
Creatinine, Ser: 0.75 mg/dL (ref 0.57–1.00)
EOS (ABSOLUTE): 0.6 x10E3/uL — ABNORMAL HIGH (ref 0.0–0.4)
Eos: 8 %
Ferritin: 718 ng/mL — ABNORMAL HIGH (ref 15–150)
Globulin, Total: 3.5 g/dL (ref 1.5–4.5)
Glucose: 81 mg/dL (ref 70–99)
Hematocrit: 22.9 % — ABNORMAL LOW (ref 34.0–46.6)
Hemoglobin: 7.6 g/dL — ABNORMAL LOW (ref 11.1–15.9)
Immature Grans (Abs): 0 x10E3/uL (ref 0.0–0.1)
Immature Granulocytes: 0 %
Lymphocytes Absolute: 2.4 x10E3/uL (ref 0.7–3.1)
Lymphs: 35 %
MCH: 29.9 pg (ref 26.6–33.0)
MCHC: 33.2 g/dL (ref 31.5–35.7)
MCV: 90 fL (ref 79–97)
Monocytes Absolute: 1.1 x10E3/uL — ABNORMAL HIGH (ref 0.1–0.9)
Monocytes: 16 %
NRBC: 2 % — ABNORMAL HIGH (ref 0–0)
Neutrophils Absolute: 2.8 x10E3/uL (ref 1.4–7.0)
Neutrophils: 39 %
Platelets: 217 x10E3/uL (ref 150–450)
Potassium: 3.8 mmol/L (ref 3.5–5.2)
RBC: 2.54 x10E6/uL — CL (ref 3.77–5.28)
RDW: 16.7 % — ABNORMAL HIGH (ref 11.7–15.4)
Retic Ct Pct: 5.4 % — ABNORMAL HIGH (ref 0.6–2.6)
Sodium: 137 mmol/L (ref 134–144)
Total Protein: 7.6 g/dL (ref 6.0–8.5)
Vit D, 25-Hydroxy: 20.5 ng/mL — ABNORMAL LOW (ref 30.0–100.0)
WBC: 7 x10E3/uL (ref 3.4–10.8)
eGFR: 89 mL/min/1.73 (ref >59)

## 2024-04-20 ENCOUNTER — Ambulatory Visit: Payer: Self-pay | Admitting: Nurse Practitioner

## 2024-04-24 DIAGNOSIS — R338 Other retention of urine: Secondary | ICD-10-CM | POA: Diagnosis not present

## 2024-05-07 ENCOUNTER — Other Ambulatory Visit: Payer: Self-pay

## 2024-05-07 DIAGNOSIS — M62830 Muscle spasm of back: Secondary | ICD-10-CM

## 2024-05-07 DIAGNOSIS — G8929 Other chronic pain: Secondary | ICD-10-CM

## 2024-05-07 NOTE — Telephone Encounter (Signed)
 Pharmacy advised that Tablets will only be covered. Changed script from capsules . KH

## 2024-05-08 ENCOUNTER — Other Ambulatory Visit: Payer: Self-pay | Admitting: Nurse Practitioner

## 2024-05-08 DIAGNOSIS — M62830 Muscle spasm of back: Secondary | ICD-10-CM

## 2024-05-08 DIAGNOSIS — G8929 Other chronic pain: Secondary | ICD-10-CM

## 2024-05-08 MED ORDER — TIZANIDINE HCL 2 MG PO TABS: 2.0000 mg | ORAL_TABLET | Freq: Three times a day (TID) | ORAL | 1 refills | Status: DC | PRN

## 2024-05-11 ENCOUNTER — Telehealth: Payer: Self-pay | Admitting: Nurse Practitioner

## 2024-05-11 NOTE — Telephone Encounter (Signed)
 Rx refill tiZANidine  (ZANAFLEX ) 2 MG tablet [[id number]]

## 2024-05-11 NOTE — Telephone Encounter (Signed)
 Refill rxtiZANidine (ZANAFLEX ) 2 MG tablet [[id number]]

## 2024-05-14 ENCOUNTER — Other Ambulatory Visit: Payer: Self-pay

## 2024-05-14 NOTE — Telephone Encounter (Signed)
 Please advise [facility]

## 2024-05-14 NOTE — Telephone Encounter (Signed)
 I think this has been done. KH

## 2024-05-14 NOTE — Telephone Encounter (Signed)
 Sent to pcp [facility]

## 2024-05-15 ENCOUNTER — Ambulatory Visit: Payer: Self-pay | Admitting: Nurse Practitioner

## 2024-05-15 MED ORDER — TIZANIDINE HCL 2 MG PO TABS: 2.0000 mg | ORAL_TABLET | Freq: Three times a day (TID) | ORAL | 1 refills | Status: DC | PRN

## 2024-06-01 ENCOUNTER — Ambulatory Visit: Payer: Self-pay | Admitting: Nurse Practitioner

## 2024-06-01 ENCOUNTER — Telehealth: Payer: Self-pay

## 2024-06-01 NOTE — Telephone Encounter (Signed)
 LVM to contact us  to reschedule her appointment for 06/01/2024 to a different day.

## 2024-07-07 ENCOUNTER — Ambulatory Visit: Payer: Self-pay

## 2024-07-13 ENCOUNTER — Ambulatory Visit: Payer: Self-pay

## 2024-07-13 NOTE — Telephone Encounter (Signed)
 Called Patient back. Patient was instructed to go to ED and we will follow up with her, just because there were no available slots for her to come in.

## 2024-07-13 NOTE — Telephone Encounter (Signed)
 FYI Only or Action Required?: FYI only for provider: ED advised.  Patient was last seen in primary care on 04/17/2024 by Paseda, Folashade R, FNP.  Called Nurse Triage reporting Abdominal Pain.  Symptoms began several days ago.  Interventions attempted: OTC medications: ibuprofen .  Symptoms are: gradually worsening.  Triage Disposition: Go to ED Now (Notify PCP)  Patient/caregiver understands and will follow disposition?:   Copied from CRM #[id number]. Topic: Clinical - Red Word Triage >> Jul 13, 2024  3:48 PM Donna BRAVO wrote: Red Word that prompted transfer to Nurse Triage:  Symptoms: -very painful tummy -nausea -vomiting sometimes Reason for Disposition  [1] SEVERE abdominal pain AND [2] present > 1 hour  Answer Assessment - Initial Assessment Questions 1. ONSET: When did the pain begin?      2 days ago  2. LOCATION: Where does it hurt?      Generalized abdominal pain and piercing pain in my chest at night, denies CP or SOB at this time  3. SEVERITY: How bad is the pain?  (e.g., Scale 1-10; mild, moderate, or severe)     10/10 abdominal pain at this time   4. PATTERN: Is the pain constant? (e.g., yes, no; constant, intermittent)      Intermittent  5. CAUSE:  What do you think is causing the pain? Is this pain similar to the acute pain attacks you have had in the past? (e.g., similar location, quality, intensity)     Sickle cell crisis pt suspects  6. PAIN MEDICINES:      Ibuprofen   7. OTHER SYMPTOMS: Do you have any other symptoms? (e.g., fever, chest pain, shortness of breath, new areas of swelling or redness) Nausea without vomiting  Protocols used: Sickle Cell Disease - Acute Pain Episode (Crisis)-A-AH

## 2024-07-20 ENCOUNTER — Telehealth: Payer: Self-pay | Admitting: *Deleted

## 2024-07-20 NOTE — Transitions of Care (Post Inpatient/ED Visit) (Signed)
" ° °  07/20/2024  Name: [name] MRN: [id number] DOB: 1961-04-13  Today's TOC FU Call Status: Today's TOC FU Call Status:: Unsuccessful Call (1st Attempt) Unsuccessful Call (1st Attempt) Date: 07/20/24  Attempted to reach the patient regarding the most recent Inpatient/ED visit.  Follow Up Plan: Additional outreach attempts will be made to reach the patient to complete the Transitions of Care (Post Inpatient/ED visit) call.   Cathlean Headland BSN RN [facility] Health Care Management Coordinator [email or web address] Direct Dial: [phone]  Fax: [phone] Website: [email or web address]  "

## 2024-07-22 ENCOUNTER — Telehealth: Payer: Self-pay

## 2024-07-22 NOTE — Transitions of Care (Post Inpatient/ED Visit) (Signed)
 "  07/22/2024  Name: [name] MRN: [id number] DOB: [birth year]  Today's TOC FU Call Status: Today's TOC FU Call Status:: Unsuccessful Call (1st Attempt) Unsuccessful Call (1st Attempt) Date: 07/22/24  Patient's Name and Date of Birth confirmed.    Transition Care Management Follow-up Telephone Call Date of Discharge: 07/17/24 Discharge Facility: Other [facility]) Name of Other (Non[facility]) Discharge Facility: [facility] Type of Discharge: Inpatient Admission Primary Inpatient Discharge Diagnosis:: Sickle Cell Crises How have you been since you were released from the hospital?: Same  Items Reviewed: Did you receive and understand the discharge instructions provided?: Yes Medications obtained,verified, and reconciled?: Yes (Medications Reviewed) Any new allergies since your discharge?: No Dietary orders reviewed?: Yes Type of Diet Ordered:: regular Do you have support at home?: Yes People in Home [RPT]: parent(s) Name of Support/Comfort Primary Source: [name]  Medications Reviewed Today: Medications Reviewed Today     Reviewed by Libra Gatz E, RN (Registered Nurse) on 07/22/24 at 0940  Med List Status: <None>   Medication Order Taking? Sig Documenting Provider Last Dose Status Informant  acetaminophen  (TYLENOL ) 650 MG CR tablet [id number] Yes Take 650 mg by mouth every 8 (eight) hours as needed for pain. Patient states she is taking 325mg  2 tablets ever 6 hours as needed for fever greater thatn 100.4 F [provider]  Active            Med Note SAMULE, [facility]   Tue Jun 26, 2022 10:30 AM) Prn   albuterol  (VENTOLIN  HFA) 108 (90 Base) MCG/ACT inhaler [id number] Yes SMARTSIG:2 Puff(s) By Mouth Every 4 Hours PRN [provider]  Active   ASCORBIC ACID  PO [id number] Yes Take 1 tablet by mouth every morning. [provider]  Active Self  calcium  citrate (CALCITRATE - DOSED IN MG ELEMENTAL CALCIUM ) 950 (200  Ca) MG tablet [id number] Yes Take 200 mg of elemental calcium  by mouth daily. [provider]  Active   CALCIUM  PO [id number]  Take 500 mg by mouth.  Patient not taking: Reported on 04/17/2024   [provider]  Active   Cholecalciferol  (VITAMIN D3 PO) [id number] Yes Take 1 tablet by mouth daily. [provider]  Active Self  cyanocobalamin  (VITAMIN B12) 500 MCG tablet [id number] Yes Take 500 mcg by mouth daily. [provider]  Active   DULoxetine  (CYMBALTA ) 30 MG capsule [id number] Yes Take 1 capsule (30 mg total) by mouth daily. Paseda, Folashade R, FNP  Active   famotidine  (PEPCID ) 20 MG tablet [id number]  Take 20 mg by mouth 2 (two) times daily.  Patient not taking: Reported on 07/22/2024   [provider]  Active   folic acid  (FOLVITE ) 1 MG tablet [id number] Yes Take 1 tablet (1 mg total) by mouth daily. Paseda, Folashade R, FNP  Active   ibuprofen  (ADVIL ) 800 MG tablet [id number]  Take 1 tablet (800 mg total) by mouth every 8 (eight) hours as needed.  Patient not taking: Reported on 07/22/2024   Paseda, Folashade R, FNP  Active   levothyroxine  (SYNTHROID ) 200 MCG tablet [id number] Yes Take 1 tablet (200 mcg total) by mouth daily. Paseda, Folashade R, FNP  Active   omega-3 acid ethyl esters (LOVAZA ) 1 g capsule [id number] Yes Take 1 capsule (1 g total) by mouth 2 (two) times daily. Paseda, Folashade R, FNP  Active   oxyCODONE  (OXY IR/ROXICODONE ) 5 MG immediate release tablet [id number] Yes Take 5 mg by mouth every 4 (four) hours as needed for  severe pain (pain score 7-10). [provider]  Active   pantoprazole  (PROTONIX ) 40 MG tablet [id number] Yes Take 40 mg by mouth daily. [provider]  Active   potassium chloride  (MICRO-K ) 10 MEQ CR capsule [id number]  Take 10 mEq by mouth 2 (two) times daily.  Patient not taking: Reported on 07/22/2024   [provider]  Active   senna-docusate (SENOKOT-S) 8.6-50 MG tablet [id number]  Take 1  tablet by mouth daily.  Patient not taking: Reported on 07/22/2024   [provider]  Active   tamsulosin (FLOMAX) 0.4 MG CAPS capsule [id number] Yes Take 0.4 mg by mouth. [provider]  Active   tiZANidine  (ZANAFLEX ) 2 MG tablet [id number] Yes Take 1 tablet (2 mg total) by mouth every 8 (eight) hours as needed for muscle spasms. Paseda, Folashade R, FNP  Active   Vitamin D , Ergocalciferol , (DRISDOL ) 1.25 MG (50000 UNIT) CAPS capsule [id number]  Take 1 capsule (50,000 Units total) by mouth every 7 (seven) days.  Patient not taking: Reported on 07/22/2024   Paseda, Folashade R, FNP  Active             Home Care and Equipment/Supplies: Were Home Health Services Ordered?: No Any new equipment or medical supplies ordered?: No  Functional Questionnaire: Do you need assistance with bathing/showering or dressing?: No Do you need assistance with meal preparation?: No Do you need assistance with eating?: No Do you have difficulty maintaining continence: No Do you need assistance with getting out of bed/getting out of a chair/moving?: No Do you have difficulty managing or taking your medications?: No  Follow up appointments reviewed: PCP Follow-up appointment confirmed?: Yes Date of PCP follow-up appointment?: 07/28/24 Follow-up Provider: Folashade Paseda Do you need transportation to your follow-up appointment?: No Do you understand care options if your condition(s) worsen?: Yes-patient verbalized understanding  SDOH Interventions Today    Flowsheet Row Most Recent Value  SDOH Interventions   Food Insecurity Interventions Intervention Not Indicated  Housing Interventions Intervention Not Indicated  Transportation Interventions Intervention Not Indicated  Utilities Interventions Intervention Not Indicated   Discussed and offered 30 day TOC program.  Patient  verbally agreed.  The patient has been provided with contact information for the care management team and has  been advised to call with any health -related questions or concerns.  The patient verbalized understanding with current plan of care.  The patient is directed to their insurance card regarding availability of benefits coverage.    Arvin Seip RN, BSN, CCM [employer], Population Health Case Manager Phone: [phone]  "

## 2024-07-22 NOTE — Patient Instructions (Signed)
 Visit Information  Thank you for taking time to visit with me today. Please don't hesitate to contact me if I can be of assistance to you before our next scheduled telephone appointment.  Our next appointment is by telephone on 07/30/24 at 2 pm  Following is a copy of your care plan:   Goals Addressed             This Visit's Progress    VBCI Transitions of Care (TOC) Care Plan       Problems:  Recent Hospitalization for treatment of sickle cell crisis Limited social support: patient state she doesn't have anyone that can pick up her medications for her if she is unable to get them.  Patient reports her mother Tashauna Caisse is her social support, No Hospital Follow Up Provider appointment assisted patient with scheduling a hospital follow up appointment for 07/28/24 at 2:20pm, and SDOH barrier: patient reports having depression like symptoms.  PHA2 AND 9 completed with patient. Score 13.  Offered referral to child psychotherapist. Patient verbally agreeable to social work referral   Goal:  Over the next 30 days, the patient will not experience hospital readmission  Interventions:  Transitions of Care: Community Resource Referral Made to address Depression Communication with primary care provider  re: enrollment in the 30 day TOC program.  Arranged PCP follow-up within 7 days (Care Guide Scheduled) Assessed pain level Advised to notify primary care provider of ongoing abdominal pain Advised to notify primary care provider if no contact received from GI office.   Advised to stay hydrated drinking at least 2-3 L of fluids daily Avoid extreme temperatures Take medications ( pain medications) as prescribed Identify crisis triggers such as dehydration, cold/heat exposure, stress, smoking, alcohol use Advised to recognize warning signs: dizziness, dyspnea, new neurologic symptoms, chest pain, fevere, persistent pain beyond 48 hours Coping mechanisms:  deep breathing, relaxation, social  support Referred patient to social worker regarding PHQ2 AND 9 results.  Assisted patient with securing hospital follow up visit with primary care provider.  Advised to have family and/ or friend assist her with picking up her medication from the pharmacy  Patient Self Care Activities:  Attend all scheduled provider appointments Call pharmacy for medication refills 3-7 days in advance of running out of medications Call provider office for new concerns or questions  Notify RN Care Manager of [facility] call rescheduling needs Participate in Transition of Care Program/Attend TOC scheduled calls Take medications as prescribed   Work with the social worker to address care coordination needs and will continue to work with the clinical team to address health care and disease management related needs Avoid extreme temperatures Stay hydrated Notify provider for increase in symptoms Call day hospital  [phone] Have friend and/ or family member assist you with picking up your medication from the pharmacy  Plan:  Telephone follow up appointment with care management team member scheduled for:  07/31/23 at 2 pm The patient has been provided with contact information for the care management team and has been advised to call with any health related questions or concerns.         Patient verbalizes understanding of instructions and care plan provided today and agrees to view in MyChart. Active MyChart status and patient understanding of how to access instructions and care plan via MyChart confirmed with patient.     The patient has been provided with contact information for the care management team and has been advised to call with any health related  questions or concerns.   Please call the care guide team at [phone] if you need to cancel or reschedule your appointment.   Please call the Suicide and Crisis Lifeline: 988 call the [place]  National Suicide Prevention Lifeline: 570-765-7956 or TTY:  (779)425-7249 TTY 564-646-7145) to talk to a trained counselor call 1-800-273-TALK (toll free, 24 hour hotline) if you are experiencing a Mental Health or Behavioral Health Crisis or need someone to talk to.  Arvin Seip RN, BSN, CCM [employer], Population Health Case Manager Phone: [phone]

## 2024-07-28 ENCOUNTER — Ambulatory Visit: Admitting: Nurse Practitioner

## 2024-07-28 ENCOUNTER — Encounter: Payer: Self-pay | Admitting: Nurse Practitioner

## 2024-07-28 ENCOUNTER — Telehealth: Payer: Self-pay | Admitting: Nurse Practitioner

## 2024-07-28 VITALS — BP 112/53 | HR 70 | Temp 97.1°F | Wt 145.2 lb

## 2024-07-28 DIAGNOSIS — E079 Disorder of thyroid, unspecified: Secondary | ICD-10-CM

## 2024-07-28 DIAGNOSIS — Z09 Encounter for follow-up examination after completed treatment for conditions other than malignant neoplasm: Secondary | ICD-10-CM

## 2024-07-28 DIAGNOSIS — Z0000 Encounter for general adult medical examination without abnormal findings: Secondary | ICD-10-CM | POA: Insufficient documentation

## 2024-07-28 DIAGNOSIS — D571 Sickle-cell disease without crisis: Secondary | ICD-10-CM

## 2024-07-28 DIAGNOSIS — E039 Hypothyroidism, unspecified: Secondary | ICD-10-CM

## 2024-07-28 DIAGNOSIS — G8929 Other chronic pain: Secondary | ICD-10-CM | POA: Insufficient documentation

## 2024-07-28 DIAGNOSIS — M057A Rheumatoid arthritis with rheumatoid factor of other specified site without organ or systems involvement: Secondary | ICD-10-CM

## 2024-07-28 DIAGNOSIS — F332 Major depressive disorder, recurrent severe without psychotic features: Secondary | ICD-10-CM

## 2024-07-28 DIAGNOSIS — F419 Anxiety disorder, unspecified: Secondary | ICD-10-CM | POA: Insufficient documentation

## 2024-07-28 DIAGNOSIS — E559 Vitamin D deficiency, unspecified: Secondary | ICD-10-CM

## 2024-07-28 DIAGNOSIS — Z748 Other problems related to care provider dependency: Secondary | ICD-10-CM | POA: Insufficient documentation

## 2024-07-28 MED ORDER — TIZANIDINE HCL 2 MG PO TABS: 2.0000 mg | ORAL_TABLET | Freq: Three times a day (TID) | ORAL | 1 refills | Status: AC | PRN

## 2024-07-28 NOTE — Assessment & Plan Note (Signed)
" °    07/28/2024    2:25 PM 04/17/2024    2:39 PM 09/27/2023    3:17 PM 08/30/2023    2:33 PM  GAD 7 : Generalized Anxiety Score  Nervous, Anxious, on Edge 0 3 1 0  Control/stop worrying 3 3 3 3   Worry too much - different things 3 3 3  0  Trouble relaxing 3 3 3 3   Restless 0 3 1 3   Easily annoyed or irritable 3 3 3 3   Afraid - awful might happen 0 0 1 3  Total GAD 7 Score 12 18 15 15   Anxiety Difficulty Extremely difficult Extremely difficult Somewhat difficult Somewhat difficult  Continue duloxetine  30 mg daily reevaluate at next visit   "

## 2024-07-28 NOTE — Assessment & Plan Note (Signed)
 Hospital chart reviewed, including discharge summary Medications reconciled and reviewed with the patient in detail

## 2024-07-28 NOTE — Assessment & Plan Note (Signed)
" °    07/28/2024    2:24 PM 07/22/2024    9:26 AM 04/17/2024    2:38 PM  Depression screen PHQ 2/9  Decreased Interest 3 3 3   Down, Depressed, Hopeless 3 3 3   PHQ - 2 Score 6 6 6   Altered sleeping 3 0 3  Tired, decreased energy 3 2 3   Change in appetite 0 0 1  Feeling bad or failure about yourself  3 3 0  Trouble concentrating 3 2 3   Moving slowly or fidgety/restless 0 0 3  Suicidal thoughts 0 0 0  PHQ-9 Score 18 13 19    Difficult doing work/chores Extremely dIfficult Not difficult at all Extremely dIfficult     Data saved with a previous flowsheet row definition  Continue duloxetine  30 mg daily Will reevaluate at next visit "

## 2024-07-28 NOTE — Assessment & Plan Note (Signed)
 Last vitamin D  Lab Results  Component Value Date   VD25OH 20.5 (L) 04/17/2024    Vitamin D  deficiency Not taking vitamin D  as advised, levels to be checked. - Checked vitamin D  levels.

## 2024-07-28 NOTE — Patient Instructions (Addendum)
 Please come fasting to your next appointment    It is important that you exercise regularly at least 30 minutes 5 times a week as tolerated  Think about what you will eat, plan ahead. Choose " clean, green, fresh or frozen" over canned, processed or packaged foods which are more sugary, salty and fatty. 70 to 75% of food eaten should be vegetables and fruit. Three meals at set times with snacks allowed between meals, but they must be fruit or vegetables. Aim to eat over a 12 hour period , example 7 am to 7 pm, and STOP after  your last meal of the day. Drink water,generally about 64 ounces per day, no other drink is as healthy. Fruit juice is best enjoyed in a healthy way, by EATING the fruit.  Thanks for choosing [facility] we consider it a privelige to serve you.

## 2024-07-28 NOTE — Progress Notes (Signed)
 "  Established Patient Office Visit  Subjective:  Patient ID: [name], female    DOB: [birth year]  Age: 64 y.o. MRN: [id number]  CC:  Chief Complaint  Patient presents with   Hospitalization Follow-up    Lower abdominal still hurts.     HPI   Discussed the use of AI scribe software for clinical note transcription with the patient, who gave verbal consent to proceed.  History of Present Illness [name] is a 64 year old female  has a past medical history of Aneurysm, Anxiety, Arthritis, Colon ulcer, Gall stones, Hypokalemia, Hypothyroidism, Pneumonia, Sickle cell anemia (HCC), and Ulcers of both lower extremities (HCC).   who presents for follow-up after a recent hospitalization for sickle cell crisis.  She was hospitalized at [facility] in [facility] from December 23 to July 16, 2024, for a sickle cell crisis. During her stay, she received two units of packed red blood cells due to a low hemoglobin level of 5.8 g/dL on admission. Since discharge, she feels 'a little better' but continues to experience fatigue.  She has ongoing abdominal pain primarily located in the lower abdomen and around the stomach area, present since January 24, 2024. She experiences nausea but no vomiting or constipation. She has not yet seen a gastroenterologist.   For pain management, she was previously taking oxycodone  5 mg every four hours as needed but stopped due to stomach discomfort. She now takes Tylenol  650 mg every eight hours as needed and is not taking ibuprofen . She has run out of tizanidine  and is not currently taking vitamin D  or B12. She continues to take folic acid  800 mcg and duloxetine . She is also on pantoprazole  40 mg daily for acid reflux, which was started during her hospital stay. She takes levothyroxine  daily for thyroid  management, although she did not bring it to the appointment.  Her eyes are 'a little yellow,' which she attributes to her sickle cell condition. No joint pain  except for arthritis in her left wrist. She received a flu shot at [facility] and stays well-hydrated.  She has trouble with transportation, affecting her ability to attend follow-up appointments. Her insurance provides transportation assistance, but it requires scheduling seven days in advance.  Scat form completed today   Assessment & Plan      Past Medical History:  Diagnosis Date   Aneurysm    Anxiety    Arthritis    Colon ulcer    Gall stones    Hypokalemia    Hypothyroidism    Pneumonia    Sickle cell anemia (HCC)    Ulcers of both lower extremities (HCC)     Past Surgical History:  Procedure Laterality Date   ANKLE SURGERY  1989 and 1990   CEREBRAL ANEURYSM REPAIR     COLONOSCOPY  05/12/2012   Procedure: COLONOSCOPY;  Surgeon: Princella CHRISTELLA Nida, MD;  Location: [facility];  Service: Endoscopy;  Laterality: N/A;   ESOPHAGOGASTRODUODENOSCOPY  05/12/2012   Procedure: ESOPHAGOGASTRODUODENOSCOPY (EGD);  Surgeon: Princella CHRISTELLA Nida, MD;  Location: [facility];  Service: Endoscopy;  Laterality: N/A;   gall stone removal     GIVENS CAPSULE STUDY  05/13/2012   Procedure: GIVENS CAPSULE STUDY;  Surgeon: Princella CHRISTELLA Nida, MD;  Location: [facility];  Service: Endoscopy;  Laterality: N/A;    Family History  Problem Relation Age of Onset   Cancer Mother 68       Esophageal   Sickle cell trait Mother    Sickle cell  trait Father    HIV/AIDS Brother    Colon cancer Neg Hx     Social History   Socioeconomic History   Marital status: Single    Spouse name: Not on file   Number of children: Not on file   Years of education: Not on file   Highest education level: Not on file  Occupational History   Not on file  Tobacco Use   Smoking status: Never   Smokeless tobacco: Never  Vaping Use   Vaping status: Never Used  Substance and Sexual Activity   Alcohol use: Yes    Comment: Occasional   Drug use: No   Sexual activity: Not Currently  Other Topics Concern   Not on file   Social History Narrative   Lives at home, 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    Social Drivers of Health   Tobacco Use: Low Risk (07/28/2024)   Patient History    Smoking Tobacco Use: Never    Smokeless Tobacco Use: Never    Passive Exposure: Not on file  Recent Concern: Tobacco Use - Medium Risk (07/15/2024)   Received from [facility]   Patient History    Smoking Tobacco Use: Former    Smokeless Tobacco Use: Never    Passive Exposure: Not on file  Financial Resource Strain: High Risk (07/15/2024)   Received from [facility]   Overall Financial Resource Strain (CARDIA)    How hard is it for you to pay for the very basics like food, housing, medical care, and heating?: Hard  Food Insecurity: No Food Insecurity (07/28/2024)   Epic    Worried About Programme Researcher, Broadcasting/film/video in the Last Year: Never true    Ran Out of Food in the Last Year: Never true  Recent Concern: Food Insecurity - High Risk (07/15/2024)   Received from [facility]   Epic    Within the past 12 months, you worried that your food would run out before you got money to buy more: Often true    Within the past 12 months, the food you bought just didn't last and you didn't have money to get more. : Often true  Transportation Needs: Unmet Transportation Needs (07/28/2024)   Epic    Lack of Transportation (Medical): Yes    Lack of Transportation (Non-Medical): Yes  Physical Activity: Inactive (09/13/2022)   Exercise Vital Sign    Days of Exercise per Week: 0 days    Minutes of Exercise per Session: 0 min  Stress: No Stress Concern Present (09/13/2022)   [employer] of Occupational Health - Occupational Stress Questionnaire    Feeling of Stress : Not at all  Social Connections: Unknown (07/15/2024)   Received from [facility]   Social Connection and Isolation Panel    In a typical week, how many times do you talk on the phone with family, friends, or neighbors?: Once a week    Frequency of Social  Gatherings with Friends and Family: Not on file    Attends Religious Services: Not on file    Active Member of Clubs or Organizations: Not on file    Attends Banker Meetings: Not on file    Marital Status: Not on file  Intimate Partner Violence: Not At Risk (07/28/2024)   Epic    Fear of Current or Ex-Partner: No    Emotionally Abused: No    Physically Abused: No    Sexually Abused: No  Depression (PHQ2-9):  High Risk (07/28/2024)   Depression (PHQ2-9)    PHQ-2 Score: 18  Alcohol Screen: Low Risk (09/13/2022)   Alcohol Screen    Last Alcohol Screening Score (AUDIT): 0  Housing: Low Risk (07/28/2024)   Epic    Unable to Pay for Housing in the Last Year: No    Number of Times Moved in the Last Year: 0    Homeless in the Last Year: No  Utilities: Not At Risk (07/22/2024)   Epic    Threatened with loss of utilities: No  Recent Concern: Utilities - Medium Risk (07/15/2024)   Received from [facility]   Utilities    In the past 12 months has the electric, gas, oil, or water company threatened to shut off services in your home? : Yes  Health Literacy: Not on file    Outpatient Medications Prior to Visit  Medication Sig Dispense Refill   acetaminophen  (TYLENOL ) 650 MG CR tablet Take 650 mg by mouth every 8 (eight) hours as needed for pain. Patient states she is taking 325mg  2 tablets ever 6 hours as needed for fever greater thatn 100.4 F     albuterol  (VENTOLIN  HFA) 108 (90 Base) MCG/ACT inhaler SMARTSIG:2 Puff(s) By Mouth Every 4 Hours PRN     DULoxetine  (CYMBALTA ) 30 MG capsule Take 1 capsule (30 mg total) by mouth daily. 90 capsule 1   folic acid  (FOLVITE ) 1 MG tablet Take 1 tablet (1 mg total) by mouth daily. 90 tablet 1   levothyroxine  (SYNTHROID ) 200 MCG tablet Take 1 tablet (200 mcg total) by mouth daily. 30 tablet 1   omega-3 acid ethyl esters (LOVAZA ) 1 g capsule Take 1 capsule (1 g total) by mouth 2 (two) times daily. 60 capsule 5   pantoprazole  (PROTONIX ) 40 MG  tablet Take 40 mg by mouth daily.     tiZANidine  (ZANAFLEX ) 2 MG tablet Take 1 tablet (2 mg total) by mouth every 8 (eight) hours as needed for muscle spasms. 30 tablet 1   ASCORBIC ACID  PO Take 1 tablet by mouth every morning. (Patient not taking: Reported on 07/28/2024)     calcium  citrate (CALCITRATE - DOSED IN MG ELEMENTAL CALCIUM ) 950 (200 Ca) MG tablet Take 200 mg of elemental calcium  by mouth daily. (Patient not taking: Reported on 07/28/2024)     CALCIUM  PO Take 500 mg by mouth. (Patient not taking: Reported on 07/28/2024)     ibuprofen  (ADVIL ) 800 MG tablet Take 1 tablet (800 mg total) by mouth every 8 (eight) hours as needed. (Patient not taking: Reported on 07/28/2024) 30 tablet 2   oxyCODONE  (OXY IR/ROXICODONE ) 5 MG immediate release tablet Take 5 mg by mouth every 4 (four) hours as needed for severe pain (pain score 7-10). (Patient not taking: Reported on 07/28/2024)     senna-docusate (SENOKOT-S) 8.6-50 MG tablet Take 1 tablet by mouth daily. (Patient not taking: Reported on 07/28/2024)     Cholecalciferol  (VITAMIN D3 PO) Take 1 tablet by mouth daily. (Patient not taking: Reported on 07/28/2024)     cyanocobalamin  (VITAMIN B12) 500 MCG tablet Take 500 mcg by mouth daily. (Patient not taking: Reported on 07/28/2024)     famotidine  (PEPCID ) 20 MG tablet Take 20 mg by mouth 2 (two) times daily. (Patient not taking: Reported on 07/22/2024)     potassium chloride  (MICRO-K ) 10 MEQ CR capsule Take 10 mEq by mouth 2 (two) times daily. (Patient not taking: Reported on 07/28/2024)     tamsulosin (FLOMAX) 0.4 MG CAPS capsule Take 0.4  mg by mouth. (Patient not taking: Reported on 07/28/2024)     Vitamin D , Ergocalciferol , (DRISDOL ) 1.25 MG (50000 UNIT) CAPS capsule Take 1 capsule (50,000 Units total) by mouth every 7 (seven) days. (Patient not taking: Reported on 07/28/2024) 8 capsule 1   No facility-administered medications prior to visit.    Allergies[1]  ROS Review of Systems  Constitutional:  Positive for  fatigue. Negative for appetite change, chills and fever.  HENT:  Negative for congestion, postnasal drip, rhinorrhea and sneezing.   Respiratory:  Negative for cough, shortness of breath and wheezing.   Cardiovascular:  Negative for chest pain, palpitations and leg swelling.  Gastrointestinal:  Positive for abdominal pain. Negative for constipation and vomiting.  Genitourinary:  Negative for difficulty urinating, dysuria, flank pain and frequency.  Musculoskeletal:  Positive for arthralgias. Negative for back pain, joint swelling and myalgias.  Skin:  Negative for color change, pallor, rash and wound.  Neurological:  Negative for dizziness, facial asymmetry, weakness, numbness and headaches.  Psychiatric/Behavioral:  Negative for behavioral problems, confusion, self-injury and suicidal ideas.       Objective:    Physical Exam Vitals and nursing note reviewed.  Constitutional:      General: She is not in acute distress.    Appearance: Normal appearance. She is not ill-appearing, toxic-appearing or diaphoretic.  HENT:     Mouth/Throat:     Mouth: Mucous membranes are moist.     Pharynx: Oropharynx is clear. No oropharyngeal exudate or posterior oropharyngeal erythema.  Eyes:     General: Scleral icterus present.        Right eye: No discharge.        Left eye: No discharge.     Extraocular Movements: Extraocular movements intact.  Cardiovascular:     Rate and Rhythm: Normal rate and regular rhythm.     Pulses: Normal pulses.     Heart sounds: Normal heart sounds. No murmur heard.    No friction rub. No gallop.  Pulmonary:     Effort: Pulmonary effort is normal. No respiratory distress.     Breath sounds: Normal breath sounds. No stridor. No wheezing, rhonchi or rales.  Chest:     Chest wall: No tenderness.  Abdominal:     General: There is no distension.     Palpations: Abdomen is soft.     Tenderness: There is abdominal tenderness. There is no right CVA tenderness, left CVA  tenderness or guarding.  Musculoskeletal:        General: Tenderness present. No swelling, deformity or signs of injury.     Right lower leg: No edema.     Left lower leg: No edema.     Comments: Left wrist  Skin:    General: Skin is warm and dry.     Capillary Refill: Capillary refill takes less than 2 seconds.     Coloration: Skin is not jaundiced or pale.     Findings: No bruising, erythema or lesion.  Neurological:     Mental Status: She is alert and oriented to person, place, and time.     Motor: No weakness.     Coordination: Coordination normal.     Gait: Gait normal.  Psychiatric:        Mood and Affect: Mood normal.        Behavior: Behavior normal.        Thought Content: Thought content normal.        Judgment: Judgment normal.     BP ROLLEN)  112/53 (BP Location: Left Arm, Patient Position: Sitting, Cuff Size: Normal)   Pulse 70   Temp (!) 97.1 F (36.2 C) (Temporal)   Wt 145 lb 3.2 oz (65.9 kg)   SpO2 97%   BMI 20.25 kg/m  Wt Readings from Last 3 Encounters:  07/28/24 145 lb 3.2 oz (65.9 kg)  04/17/24 145 lb (65.8 kg)  09/27/23 155 lb 12.8 oz (70.7 kg)    Lab Results  Component Value Date   TSH 140.000 (H) 09/27/2023   Lab Results  Component Value Date   WBC 7.0 04/17/2024   HGB 7.6 (L) 04/17/2024   HCT 22.9 (L) 04/17/2024   MCV 90 04/17/2024   PLT 217 04/17/2024   Lab Results  Component Value Date   NA 137 04/17/2024   K 3.8 04/17/2024   CO2 21 04/17/2024   GLUCOSE 81 04/17/2024   BUN 5 (L) 04/17/2024   CREATININE 0.75 04/17/2024   BILITOT 3.5 (H) 04/17/2024   ALKPHOS 102 04/17/2024   AST 79 (H) 04/17/2024   ALT 13 04/17/2024   PROT 7.6 04/17/2024   ALBUMIN 4.1 04/17/2024   CALCIUM  9.4 04/17/2024   ANIONGAP 6 11/02/2022   EGFR 89 04/17/2024   Lab Results  Component Value Date   CHOL 255 (H) 06/26/2022   Lab Results  Component Value Date   HDL 27 (L) 06/26/2022   Lab Results  Component Value Date   LDLCALC 171 (H) 06/26/2022    Lab Results  Component Value Date   TRIG 298 (H) 06/26/2022   Lab Results  Component Value Date   CHOLHDL 9.4 (H) 06/26/2022   No results found for: HGBA1C    Assessment & Plan:   Problem List Items Addressed This Visit       Endocrine   Hypothyroidism   Hypothyroidism Taking levothyroxine  at home, thyroid  function to be monitored. - Continue levothyroxine  as prescribed.  Checking TSH T4        Relevant Orders   TSH + free T4     Musculoskeletal and Integument   Rheumatoid arthritis (HCC)   Rheumatoid arthritis Left wrist pain managed with Tylenol , avoiding ibuprofen . - Continue Tylenol  650 mg every 8 hours as needed.        Relevant Medications   tiZANidine  (ZANAFLEX ) 2 MG tablet     Other   Sickle cell anemia (HCC) - Primary   Sickle cell anemia Recent sickle cell crisis with hemoglobin at 5.8 g/dL, treated with PRBC transfusion. Experiencing fatigue and abdominal pain. Advised on crisis triggers. We discussed the need for good hydration, monitoring of hydration status, avoidance of heat, cold, stress, and infection triggers. We discussed the need to be adherent with taking folic acid  and other home medications. Patient was reminded of the need to seek medical attention immediately if any symptom of bleeding, anemia, or infection occurs.   Referral for eye exam placed       Relevant Orders   Sickle Cell Panel   Ambulatory referral to Ophthalmology   Major depression, recurrent      07/28/2024    2:24 PM 07/22/2024    9:26 AM 04/17/2024    2:38 PM  Depression screen PHQ 2/9  Decreased Interest 3 3 3   Down, Depressed, Hopeless 3 3 3   PHQ - 2 Score 6 6 6   Altered sleeping 3 0 3  Tired, decreased energy 3 2 3   Change in appetite 0 0 1  Feeling bad or failure about yourself  3 3 0  Trouble concentrating 3 2 3   Moving slowly or fidgety/restless 0 0 3  Suicidal thoughts 0 0 0  PHQ-9 Score 18 13 19    Difficult doing work/chores Extremely dIfficult  Not difficult at all Extremely dIfficult     Data saved with a previous flowsheet row definition  Continue duloxetine  30 mg daily Will reevaluate at next visit      Chronic hip pain   Relevant Medications   tiZANidine  (ZANAFLEX ) 2 MG tablet   Hospital discharge follow-up   Hospital chart reviewed, including discharge summary Medications reconciled and reviewed with the patient in detail       Vitamin D  deficiency   Last vitamin D  Lab Results  Component Value Date   VD25OH 20.5 (L) 04/17/2024    Vitamin D  deficiency Not taking vitamin D  as advised, levels to be checked. - Checked vitamin D  levels.       Chronic abdominal pain   Chronic abdominal pain Persistent pain since July 4th last year, with nausea. Gastroenterology referral not followed up. - Instructed to schedule gastroenterology appointment. Continue pantoprazole  40 mg daily       Relevant Medications   tiZANidine  (ZANAFLEX ) 2 MG tablet   Health care maintenance   General Health Maintenance Flu shot received, overdue for eye exam, Pap smear, and cholesterol check. - Referred to eye doctor for annual exam. - Will schedule Pap smear. - Will check cholesterol levels at next visit, ensure fasting      Assistance needed with transportation   SCAT form application completed      Anxiety      07/28/2024    2:25 PM 04/17/2024    2:39 PM 09/27/2023    3:17 PM 08/30/2023    2:33 PM  GAD 7 : Generalized Anxiety Score  Nervous, Anxious, on Edge 0 3 1 0  Control/stop worrying 3 3 3 3   Worry too much - different things 3 3 3  0  Trouble relaxing 3 3 3 3   Restless 0 3 1 3   Easily annoyed or irritable 3 3 3 3   Afraid - awful might happen 0 0 1 3  Total GAD 7 Score 12 18 15 15   Anxiety Difficulty Extremely difficult Extremely difficult Somewhat difficult Somewhat difficult  Continue duloxetine  30 mg daily reevaluate at next visit         Meds ordered this encounter  Medications   tiZANidine  (ZANAFLEX ) 2 MG  tablet    Sig: Take 1 tablet (2 mg total) by mouth every 8 (eight) hours as needed for muscle spasms.    Dispense:  30 tablet    Refill:  1    Follow-up: Return in about 6 weeks (around 09/08/2024), or [facility], for CPE.    Jahmeer Porche R Marine Lezotte, FNP     [1]  Allergies Allergen Reactions   Ampicillin Other (See Comments)    Caused red bumps on the skin, but nothing else Per patient, she tolerates cephalosporins without problems (??)   Demerol [Meperidine] Other (See Comments)    A high(er) dose caused seizure(s)   "

## 2024-07-28 NOTE — Assessment & Plan Note (Signed)
 Sickle cell anemia Recent sickle cell crisis with hemoglobin at 5.8 g/dL, treated with PRBC transfusion. Experiencing fatigue and abdominal pain. Advised on crisis triggers. We discussed the need for good hydration, monitoring of hydration status, avoidance of heat, cold, stress, and infection triggers. We discussed the need to be adherent with taking folic acid  and other home medications. Patient was reminded of the need to seek medical attention immediately if any symptom of bleeding, anemia, or infection occurs.   Referral for eye exam placed

## 2024-07-28 NOTE — Telephone Encounter (Signed)
 Documents left in providers folder 07/28/24

## 2024-07-28 NOTE — Assessment & Plan Note (Signed)
 Hypothyroidism Taking levothyroxine  at home, thyroid  function to be monitored. - Continue levothyroxine  as prescribed.  Checking TSH T4

## 2024-07-28 NOTE — Assessment & Plan Note (Signed)
 General Health Maintenance Flu shot received, overdue for eye exam, Pap smear, and cholesterol check. - Referred to eye doctor for annual exam. - Will schedule Pap smear. - Will check cholesterol levels at next visit, ensure fasting

## 2024-07-28 NOTE — Assessment & Plan Note (Signed)
 Rheumatoid arthritis Left wrist pain managed with Tylenol , avoiding ibuprofen . - Continue Tylenol  650 mg every 8 hours as needed.

## 2024-07-28 NOTE — Assessment & Plan Note (Signed)
 SCAT form application completed

## 2024-07-28 NOTE — Assessment & Plan Note (Signed)
 Chronic abdominal pain Persistent pain since July 4th last year, with nausea. Gastroenterology referral not followed up. - Instructed to schedule gastroenterology appointment. Continue pantoprazole  40 mg daily

## 2024-07-29 ENCOUNTER — Ambulatory Visit: Payer: Self-pay | Admitting: Nurse Practitioner

## 2024-07-29 DIAGNOSIS — E876 Hypokalemia: Secondary | ICD-10-CM

## 2024-07-29 DIAGNOSIS — E559 Vitamin D deficiency, unspecified: Secondary | ICD-10-CM

## 2024-07-29 DIAGNOSIS — E039 Hypothyroidism, unspecified: Secondary | ICD-10-CM

## 2024-07-29 LAB — CMP14+CBC/D/PLT+FER+RETIC+V...
ALT: 19 IU/L (ref 0–32)
AST: 62 IU/L — ABNORMAL HIGH (ref 0–40)
Albumin: 4.1 g/dL (ref 3.9–4.9)
Alkaline Phosphatase: 104 IU/L (ref 49–135)
BUN/Creatinine Ratio: 10 — ABNORMAL LOW (ref 12–28)
BUN: 8 mg/dL (ref 8–27)
Basophils Absolute: 0.1 x10E3/uL (ref 0.0–0.2)
Basos: 1 %
Bilirubin Total: 3.1 mg/dL — ABNORMAL HIGH (ref 0.0–1.2)
CO2: 20 mmol/L (ref 20–29)
Calcium: 9.7 mg/dL (ref 8.7–10.3)
Chloride: 105 mmol/L (ref 96–106)
Creatinine, Ser: 0.84 mg/dL (ref 0.57–1.00)
EOS (ABSOLUTE): 0.5 x10E3/uL — ABNORMAL HIGH (ref 0.0–0.4)
Eos: 6 %
Ferritin: 862 ng/mL — ABNORMAL HIGH (ref 15–150)
Globulin, Total: 3.8 g/dL (ref 1.5–4.5)
Glucose: 75 mg/dL (ref 70–99)
Hematocrit: 19.8 % — ABNORMAL LOW (ref 34.0–46.6)
Hemoglobin: 6.7 g/dL — CL (ref 11.1–15.9)
Immature Grans (Abs): 0 x10E3/uL (ref 0.0–0.1)
Immature Granulocytes: 0 %
Lymphocytes Absolute: 3.4 x10E3/uL — ABNORMAL HIGH (ref 0.7–3.1)
Lymphs: 38 %
MCH: 30.3 pg (ref 26.6–33.0)
MCHC: 33.8 g/dL (ref 31.5–35.7)
MCV: 90 fL (ref 79–97)
Monocytes Absolute: 1.2 x10E3/uL — ABNORMAL HIGH (ref 0.1–0.9)
Monocytes: 13 %
NRBC: 14 % — ABNORMAL HIGH (ref 0–0)
Neutrophils Absolute: 3.9 x10E3/uL (ref 1.4–7.0)
Neutrophils: 42 %
Platelets: 225 x10E3/uL (ref 150–450)
Potassium: 3.2 mmol/L — ABNORMAL LOW (ref 3.5–5.2)
RBC: 2.21 x10E6/uL — CL (ref 3.77–5.28)
RDW: 20 % — ABNORMAL HIGH (ref 11.7–15.4)
Retic Ct Pct: 13.5 % — ABNORMAL HIGH (ref 0.6–2.6)
Sodium: 140 mmol/L (ref 134–144)
Total Protein: 7.9 g/dL (ref 6.0–8.5)
Vit D, 25-Hydroxy: 19.3 ng/mL — ABNORMAL LOW (ref 30.0–100.0)
WBC: 9.1 x10E3/uL (ref 3.4–10.8)
eGFR: 78 mL/min/1.73

## 2024-07-29 LAB — TSH+FREE T4
Free T4: 0.99 ng/dL (ref 0.82–1.77)
TSH: 35 u[IU]/mL — ABNORMAL HIGH (ref 0.450–4.500)

## 2024-07-29 MED ORDER — LEVOTHYROXINE SODIUM 25 MCG PO TABS: 25.0000 ug | ORAL_TABLET | Freq: Every day | ORAL | 1 refills | Status: AC

## 2024-07-29 MED ORDER — POTASSIUM CHLORIDE CRYS ER 20 MEQ PO TBCR: 20.0000 meq | EXTENDED_RELEASE_TABLET | Freq: Every day | ORAL | 0 refills | Status: AC

## 2024-07-29 MED ORDER — VITAMIN D3 25 MCG (1000 UT) PO CAPS: 1000.0000 [IU] | ORAL_CAPSULE | Freq: Every day | ORAL | 1 refills | Status: AC

## 2024-07-29 MED ORDER — LEVOTHYROXINE SODIUM 200 MCG PO TABS: 200.0000 ug | ORAL_TABLET | Freq: Every day | ORAL | 1 refills | Status: AC

## 2024-07-30 NOTE — Telephone Encounter (Signed)
 This encounter was created in error - please disregard.

## 2024-07-31 ENCOUNTER — Other Ambulatory Visit: Payer: Self-pay | Admitting: Nurse Practitioner

## 2024-07-31 DIAGNOSIS — E876 Hypokalemia: Secondary | ICD-10-CM

## 2024-09-08 ENCOUNTER — Ambulatory Visit: Payer: Self-pay | Admitting: Nurse Practitioner
